# Patient Record
Sex: Male | Born: 1960
Health system: Southern US, Community
[De-identification: ages and names within clinical notes are randomized; demographics above are authoritative.]

## PROBLEM LIST (undated history)

## (undated) DIAGNOSIS — I429 Cardiomyopathy, unspecified: Secondary | ICD-10-CM

## (undated) DIAGNOSIS — C8590 Non-Hodgkin lymphoma, unspecified, unspecified site: Secondary | ICD-10-CM

## (undated) DIAGNOSIS — M87059 Idiopathic aseptic necrosis of unspecified femur: Secondary | ICD-10-CM

## (undated) DIAGNOSIS — B20 Human immunodeficiency virus [HIV] disease: Secondary | ICD-10-CM

## (undated) DIAGNOSIS — I10 Essential (primary) hypertension: Secondary | ICD-10-CM

## (undated) DIAGNOSIS — B029 Zoster without complications: Secondary | ICD-10-CM

## (undated) DIAGNOSIS — K130 Diseases of lips: Secondary | ICD-10-CM

## (undated) DIAGNOSIS — B59 Pneumocystosis: Secondary | ICD-10-CM

## (undated) DIAGNOSIS — Z8709 Personal history of other diseases of the respiratory system: Secondary | ICD-10-CM

## (undated) DIAGNOSIS — R079 Chest pain, unspecified: Secondary | ICD-10-CM

## (undated) DIAGNOSIS — Z21 Asymptomatic human immunodeficiency virus [HIV] infection status: Secondary | ICD-10-CM

## (undated) DIAGNOSIS — B370 Candidal stomatitis: Secondary | ICD-10-CM

## (undated) DIAGNOSIS — Z720 Tobacco use: Secondary | ICD-10-CM

## (undated) HISTORY — DX: Zoster without complications: B02.9

## (undated) HISTORY — DX: Tobacco use: Z72.0

## (undated) HISTORY — DX: Idiopathic aseptic necrosis of unspecified femur: M87.059

## (undated) HISTORY — DX: Pneumocystosis: B59

## (undated) HISTORY — DX: Diseases of lips: K13.0

## (undated) HISTORY — DX: Human immunodeficiency virus (HIV) disease: B20

## (undated) HISTORY — DX: Non-Hodgkin lymphoma, unspecified, unspecified site: C85.90

## (undated) HISTORY — DX: Personal history of other diseases of the respiratory system: Z87.09

## (undated) HISTORY — DX: Essential (primary) hypertension: I10

## (undated) HISTORY — DX: Candidal stomatitis: B37.0

## (undated) HISTORY — DX: Asymptomatic human immunodeficiency virus (hiv) infection status: Z21

## (undated) HISTORY — DX: Cardiomyopathy, unspecified: I42.9

## (undated) HISTORY — DX: Chest pain, unspecified: R07.9

---

## 2003-06-18 DIAGNOSIS — B20 Human immunodeficiency virus [HIV] disease: Secondary | ICD-10-CM | POA: Insufficient documentation

## 2003-07-10 ENCOUNTER — Ambulatory Visit (HOSPITAL_COMMUNITY): Admission: RE | Admit: 2003-07-10 | Discharge: 2003-07-10 | Payer: Self-pay | Admitting: Family Medicine

## 2003-07-10 ENCOUNTER — Encounter: Payer: Self-pay | Admitting: Family Medicine

## 2003-07-13 ENCOUNTER — Encounter: Payer: Self-pay | Admitting: *Deleted

## 2003-07-13 ENCOUNTER — Encounter: Payer: Self-pay | Admitting: Emergency Medicine

## 2003-07-13 ENCOUNTER — Inpatient Hospital Stay (HOSPITAL_COMMUNITY): Admission: EM | Admit: 2003-07-13 | Discharge: 2003-07-29 | Payer: Self-pay | Admitting: Emergency Medicine

## 2003-07-13 DIAGNOSIS — B59 Pneumocystosis: Secondary | ICD-10-CM | POA: Insufficient documentation

## 2003-07-14 ENCOUNTER — Encounter: Payer: Self-pay | Admitting: Internal Medicine

## 2003-07-15 ENCOUNTER — Encounter: Payer: Self-pay | Admitting: Internal Medicine

## 2003-07-15 ENCOUNTER — Encounter: Payer: Self-pay | Admitting: Critical Care Medicine

## 2003-07-16 ENCOUNTER — Encounter: Payer: Self-pay | Admitting: Critical Care Medicine

## 2003-07-17 ENCOUNTER — Encounter: Payer: Self-pay | Admitting: Critical Care Medicine

## 2003-07-17 ENCOUNTER — Encounter (INDEPENDENT_AMBULATORY_CARE_PROVIDER_SITE_OTHER): Payer: Self-pay | Admitting: Emergency Medicine

## 2003-07-18 ENCOUNTER — Encounter: Payer: Self-pay | Admitting: Pulmonary Disease

## 2003-07-18 ENCOUNTER — Encounter (INDEPENDENT_AMBULATORY_CARE_PROVIDER_SITE_OTHER): Payer: Self-pay | Admitting: *Deleted

## 2003-07-18 ENCOUNTER — Encounter: Payer: Self-pay | Admitting: Critical Care Medicine

## 2003-07-18 ENCOUNTER — Encounter: Payer: Self-pay | Admitting: Cardiology

## 2003-07-18 ENCOUNTER — Encounter (INDEPENDENT_AMBULATORY_CARE_PROVIDER_SITE_OTHER): Payer: Self-pay | Admitting: Emergency Medicine

## 2003-07-18 LAB — CONVERTED CEMR LAB
CD4 Count: 200 microliters
CD4 T Cell Abs: 200

## 2003-07-19 ENCOUNTER — Encounter: Payer: Self-pay | Admitting: Pulmonary Disease

## 2003-07-19 ENCOUNTER — Encounter: Payer: Self-pay | Admitting: Critical Care Medicine

## 2003-07-20 ENCOUNTER — Encounter: Payer: Self-pay | Admitting: Pulmonary Disease

## 2003-07-22 ENCOUNTER — Encounter: Payer: Self-pay | Admitting: Critical Care Medicine

## 2003-07-23 ENCOUNTER — Encounter: Payer: Self-pay | Admitting: Internal Medicine

## 2003-07-24 ENCOUNTER — Encounter: Payer: Self-pay | Admitting: Critical Care Medicine

## 2003-07-25 ENCOUNTER — Encounter: Payer: Self-pay | Admitting: Pulmonary Disease

## 2003-08-09 ENCOUNTER — Encounter: Admission: RE | Admit: 2003-08-09 | Discharge: 2003-08-09 | Payer: Self-pay | Admitting: Internal Medicine

## 2003-09-14 ENCOUNTER — Encounter: Admission: RE | Admit: 2003-09-14 | Discharge: 2003-09-14 | Payer: Self-pay | Admitting: Infectious Diseases

## 2003-10-09 ENCOUNTER — Encounter: Admission: RE | Admit: 2003-10-09 | Discharge: 2003-10-09 | Payer: Self-pay | Admitting: Infectious Diseases

## 2003-10-23 ENCOUNTER — Encounter: Admission: RE | Admit: 2003-10-23 | Discharge: 2003-10-23 | Payer: Self-pay | Admitting: Infectious Diseases

## 2003-11-27 ENCOUNTER — Encounter: Admission: RE | Admit: 2003-11-27 | Discharge: 2003-11-27 | Payer: Self-pay | Admitting: Infectious Diseases

## 2004-06-12 ENCOUNTER — Encounter: Admission: RE | Admit: 2004-06-12 | Discharge: 2004-06-12 | Payer: Self-pay | Admitting: Infectious Diseases

## 2004-06-12 ENCOUNTER — Ambulatory Visit (HOSPITAL_COMMUNITY): Admission: RE | Admit: 2004-06-12 | Discharge: 2004-06-12 | Payer: Self-pay | Admitting: Infectious Diseases

## 2005-08-11 ENCOUNTER — Emergency Department (HOSPITAL_COMMUNITY): Admission: EM | Admit: 2005-08-11 | Discharge: 2005-08-12 | Payer: Self-pay | Admitting: Emergency Medicine

## 2005-09-24 ENCOUNTER — Ambulatory Visit (HOSPITAL_COMMUNITY): Admission: RE | Admit: 2005-09-24 | Discharge: 2005-09-24 | Payer: Self-pay | Admitting: Infectious Diseases

## 2005-09-24 ENCOUNTER — Ambulatory Visit: Payer: Self-pay | Admitting: Infectious Diseases

## 2005-09-27 ENCOUNTER — Ambulatory Visit (HOSPITAL_COMMUNITY): Admission: RE | Admit: 2005-09-27 | Discharge: 2005-09-27 | Payer: Self-pay | Admitting: Infectious Diseases

## 2005-10-08 ENCOUNTER — Ambulatory Visit: Payer: Self-pay | Admitting: Infectious Diseases

## 2005-10-22 ENCOUNTER — Ambulatory Visit: Payer: Self-pay | Admitting: Infectious Diseases

## 2005-11-05 ENCOUNTER — Emergency Department (HOSPITAL_COMMUNITY): Admission: EM | Admit: 2005-11-05 | Discharge: 2005-11-05 | Payer: Self-pay | Admitting: Emergency Medicine

## 2005-11-28 ENCOUNTER — Ambulatory Visit: Payer: Self-pay | Admitting: Infectious Diseases

## 2006-01-15 ENCOUNTER — Emergency Department (HOSPITAL_COMMUNITY): Admission: EM | Admit: 2006-01-15 | Discharge: 2006-01-15 | Payer: Self-pay | Admitting: Emergency Medicine

## 2006-01-21 ENCOUNTER — Emergency Department (HOSPITAL_COMMUNITY): Admission: EM | Admit: 2006-01-21 | Discharge: 2006-01-21 | Payer: Self-pay | Admitting: Emergency Medicine

## 2006-04-29 ENCOUNTER — Encounter (INDEPENDENT_AMBULATORY_CARE_PROVIDER_SITE_OTHER): Payer: Self-pay | Admitting: *Deleted

## 2006-04-29 ENCOUNTER — Ambulatory Visit: Payer: Self-pay | Admitting: Infectious Diseases

## 2006-04-29 ENCOUNTER — Encounter: Admission: RE | Admit: 2006-04-29 | Discharge: 2006-04-29 | Payer: Self-pay | Admitting: Infectious Diseases

## 2006-08-12 ENCOUNTER — Encounter (INDEPENDENT_AMBULATORY_CARE_PROVIDER_SITE_OTHER): Payer: Self-pay | Admitting: *Deleted

## 2006-08-12 ENCOUNTER — Ambulatory Visit: Payer: Self-pay | Admitting: Infectious Diseases

## 2006-08-12 ENCOUNTER — Encounter: Admission: RE | Admit: 2006-08-12 | Discharge: 2006-08-12 | Payer: Self-pay | Admitting: Infectious Diseases

## 2006-09-18 DIAGNOSIS — Z8709 Personal history of other diseases of the respiratory system: Secondary | ICD-10-CM | POA: Insufficient documentation

## 2006-09-23 ENCOUNTER — Ambulatory Visit: Payer: Self-pay | Admitting: Internal Medicine

## 2006-11-23 ENCOUNTER — Encounter (INDEPENDENT_AMBULATORY_CARE_PROVIDER_SITE_OTHER): Payer: Self-pay | Admitting: *Deleted

## 2006-11-23 ENCOUNTER — Encounter: Admission: RE | Admit: 2006-11-23 | Discharge: 2006-11-23 | Payer: Self-pay | Admitting: Infectious Diseases

## 2006-11-23 ENCOUNTER — Ambulatory Visit: Payer: Self-pay | Admitting: Infectious Diseases

## 2006-11-23 LAB — CONVERTED CEMR LAB
AST: 21 units/L (ref 0–37)
Albumin: 4.6 g/dL (ref 3.5–5.2)
Alkaline Phosphatase: 54 units/L (ref 39–117)
Bilirubin Urine: NEGATIVE
CD4 Count: 100 microliters
HDL: 45 mg/dL (ref >39)
HIV 1 RNA Quant: 12600 copies/mL
HIV-1 RNA Quant, Log: 4.1 — ABNORMAL HIGH (ref <1.70)
Ketones, ur: NEGATIVE mg/dL
LDL Cholesterol: 105 mg/dL — ABNORMAL HIGH (ref 0–99)
MCHC: 32.8 g/dL (ref 30.0–36.0)
MCV: 87.6 fL (ref 78.0–100.0)
Nitrite: NEGATIVE
Potassium: 4.2 meq/L (ref 3.5–5.3)
Protein, ur: NEGATIVE mg/dL
RBC / HPF: NONE SEEN (ref <3)
RDW: 15.1 % — ABNORMAL HIGH (ref 11.5–14.0)
Sodium: 141 meq/L (ref 135–145)
Specific Gravity, Urine: 1.021 (ref 1.005–1.03)
Total Bilirubin: 0.6 mg/dL (ref 0.3–1.2)
Total Protein: 7.8 g/dL (ref 6.0–8.3)
Triglycerides: 64 mg/dL (ref <150)
Urobilinogen, UA: 0.2 (ref 0.0–1.0)
VLDL: 13 mg/dL (ref 0–40)

## 2006-12-30 DIAGNOSIS — F17200 Nicotine dependence, unspecified, uncomplicated: Secondary | ICD-10-CM | POA: Insufficient documentation

## 2007-01-01 ENCOUNTER — Encounter: Payer: Self-pay | Admitting: Infectious Diseases

## 2007-01-11 ENCOUNTER — Encounter (INDEPENDENT_AMBULATORY_CARE_PROVIDER_SITE_OTHER): Payer: Self-pay | Admitting: *Deleted

## 2007-01-11 LAB — CONVERTED CEMR LAB

## 2007-01-24 ENCOUNTER — Encounter (INDEPENDENT_AMBULATORY_CARE_PROVIDER_SITE_OTHER): Payer: Self-pay | Admitting: *Deleted

## 2007-01-28 ENCOUNTER — Telehealth (INDEPENDENT_AMBULATORY_CARE_PROVIDER_SITE_OTHER): Payer: Self-pay | Admitting: Infectious Diseases

## 2007-02-08 ENCOUNTER — Telehealth: Payer: Self-pay | Admitting: Infectious Diseases

## 2007-02-10 ENCOUNTER — Encounter: Payer: Self-pay | Admitting: Infectious Diseases

## 2007-02-22 ENCOUNTER — Telehealth: Payer: Self-pay | Admitting: Infectious Diseases

## 2007-04-01 ENCOUNTER — Ambulatory Visit: Payer: Self-pay | Admitting: Infectious Diseases

## 2007-04-01 ENCOUNTER — Encounter: Admission: RE | Admit: 2007-04-01 | Discharge: 2007-04-01 | Payer: Self-pay | Admitting: Infectious Diseases

## 2007-04-01 LAB — CONVERTED CEMR LAB
ALT: 22 units/L (ref 0–53)
BUN: 11 mg/dL (ref 6–23)
CD4 Count: 120 microliters
CO2: 27 meq/L (ref 19–32)
Calcium: 9.4 mg/dL (ref 8.4–10.5)
Chloride: 107 meq/L (ref 96–112)
Creatinine, Ser: 1.07 mg/dL (ref 0.40–1.50)
Glucose, Bld: 76 mg/dL (ref 70–99)
HCT: 42.9 % (ref 39.0–52.0)
HIV 1 RNA Quant: 5890 copies/mL — ABNORMAL HIGH (ref <50)
Hemoglobin: 14.4 g/dL (ref 13.0–17.0)
MCV: 86.8 fL (ref 78.0–100.0)
Platelets: 232 10*3/uL (ref 150–400)
RBC: 4.94 M/uL (ref 4.22–5.81)
Total Bilirubin: 0.6 mg/dL (ref 0.3–1.2)
WBC: 3.9 10*3/uL — ABNORMAL LOW (ref 4.0–10.5)

## 2007-04-22 ENCOUNTER — Telehealth: Payer: Self-pay | Admitting: Infectious Diseases

## 2007-04-28 ENCOUNTER — Encounter: Payer: Self-pay | Admitting: Infectious Diseases

## 2007-06-04 ENCOUNTER — Encounter (INDEPENDENT_AMBULATORY_CARE_PROVIDER_SITE_OTHER): Payer: Self-pay | Admitting: *Deleted

## 2007-06-07 ENCOUNTER — Encounter: Admission: RE | Admit: 2007-06-07 | Discharge: 2007-06-07 | Payer: Self-pay | Admitting: Infectious Diseases

## 2007-06-07 ENCOUNTER — Ambulatory Visit: Payer: Self-pay | Admitting: Infectious Diseases

## 2007-06-07 ENCOUNTER — Telehealth: Payer: Self-pay | Admitting: Infectious Diseases

## 2007-06-07 DIAGNOSIS — B370 Candidal stomatitis: Secondary | ICD-10-CM | POA: Insufficient documentation

## 2007-06-07 DIAGNOSIS — I10 Essential (primary) hypertension: Secondary | ICD-10-CM | POA: Insufficient documentation

## 2007-08-04 ENCOUNTER — Telehealth: Payer: Self-pay | Admitting: Infectious Diseases

## 2007-08-05 ENCOUNTER — Encounter: Payer: Self-pay | Admitting: Infectious Diseases

## 2007-08-05 ENCOUNTER — Telehealth: Payer: Self-pay | Admitting: Infectious Diseases

## 2007-08-19 ENCOUNTER — Telehealth (INDEPENDENT_AMBULATORY_CARE_PROVIDER_SITE_OTHER): Payer: Self-pay | Admitting: *Deleted

## 2007-09-06 ENCOUNTER — Encounter: Admission: RE | Admit: 2007-09-06 | Discharge: 2007-09-06 | Payer: Self-pay | Admitting: Infectious Diseases

## 2007-09-06 ENCOUNTER — Ambulatory Visit: Payer: Self-pay | Admitting: Infectious Diseases

## 2007-09-06 LAB — CONVERTED CEMR LAB
ALT: 18 units/L (ref 0–53)
Albumin: 4.2 g/dL (ref 3.5–5.2)
Alkaline Phosphatase: 49 units/L (ref 39–117)
Basophils Absolute: 0 10*3/uL (ref 0.0–0.1)
CO2: 23 meq/L (ref 19–32)
Eosinophils Absolute: 0.1 10*3/uL (ref 0.0–0.7)
Eosinophils Relative: 3 % (ref 0–5)
HCT: 39.3 % (ref 39.0–52.0)
Lymphocytes Relative: 27 % (ref 12–46)
Neutrophils Relative %: 49 % (ref 43–77)
Platelets: 193 10*3/uL (ref 150–400)
Potassium: 3.8 meq/L (ref 3.5–5.3)
RDW: 14.3 % — ABNORMAL HIGH (ref 11.5–14.0)
Sodium: 141 meq/L (ref 135–145)
Total Bilirubin: 0.5 mg/dL (ref 0.3–1.2)
Total Protein: 7.4 g/dL (ref 6.0–8.3)
WBC: 3.8 10*3/uL — ABNORMAL LOW (ref 4.0–10.5)

## 2007-09-20 ENCOUNTER — Ambulatory Visit: Payer: Self-pay | Admitting: Infectious Diseases

## 2007-11-16 ENCOUNTER — Telehealth: Payer: Self-pay | Admitting: Infectious Diseases

## 2007-11-16 ENCOUNTER — Encounter (INDEPENDENT_AMBULATORY_CARE_PROVIDER_SITE_OTHER): Payer: Self-pay | Admitting: *Deleted

## 2007-12-27 ENCOUNTER — Encounter: Admission: RE | Admit: 2007-12-27 | Discharge: 2007-12-27 | Payer: Self-pay | Admitting: Infectious Diseases

## 2007-12-27 ENCOUNTER — Ambulatory Visit: Payer: Self-pay | Admitting: Infectious Diseases

## 2007-12-27 LAB — CONVERTED CEMR LAB
AST: 43 units/L — ABNORMAL HIGH (ref 0–37)
Alkaline Phosphatase: 57 units/L (ref 39–117)
BUN: 10 mg/dL (ref 6–23)
Calcium: 9.3 mg/dL (ref 8.4–10.5)
Creatinine, Ser: 0.71 mg/dL (ref 0.40–1.50)
HCT: 40.4 % (ref 39.0–52.0)
HDL: 42 mg/dL (ref >39)
HIV 1 RNA Quant: 51600 copies/mL — ABNORMAL HIGH (ref <50)
HIV-1 RNA Quant, Log: 4.71 — ABNORMAL HIGH (ref <1.70)
Hemoglobin: 13 g/dL (ref 13.0–17.0)
LDL Cholesterol: 81 mg/dL (ref 0–99)
MCHC: 32.2 g/dL (ref 30.0–36.0)
MCV: 88.6 fL (ref 78.0–100.0)
RDW: 14.4 % (ref 11.5–15.5)
Total Bilirubin: 0.6 mg/dL (ref 0.3–1.2)
Total CHOL/HDL Ratio: 3.2
VLDL: 13 mg/dL (ref 0–40)

## 2008-03-03 ENCOUNTER — Telehealth (INDEPENDENT_AMBULATORY_CARE_PROVIDER_SITE_OTHER): Payer: Self-pay | Admitting: *Deleted

## 2008-03-28 ENCOUNTER — Ambulatory Visit: Payer: Self-pay | Admitting: Infectious Diseases

## 2008-03-28 ENCOUNTER — Encounter: Admission: RE | Admit: 2008-03-28 | Discharge: 2008-03-28 | Payer: Self-pay | Admitting: Infectious Diseases

## 2008-03-28 LAB — CONVERTED CEMR LAB
Alkaline Phosphatase: 55 units/L (ref 39–117)
Glucose, Bld: 79 mg/dL (ref 70–99)
HIV 1 RNA Quant: 45500 copies/mL — ABNORMAL HIGH (ref <50)
HIV-1 RNA Quant, Log: 4.66 — ABNORMAL HIGH (ref <1.70)
MCHC: 33.4 g/dL (ref 30.0–36.0)
MCV: 85.7 fL (ref 78.0–100.0)
RBC: 4.61 M/uL (ref 4.22–5.81)
Sodium: 145 meq/L (ref 135–145)
Total Bilirubin: 0.5 mg/dL (ref 0.3–1.2)
Total Protein: 7.5 g/dL (ref 6.0–8.3)

## 2008-05-03 ENCOUNTER — Telehealth: Payer: Self-pay

## 2008-05-04 ENCOUNTER — Ambulatory Visit: Payer: Self-pay | Admitting: Oncology

## 2008-05-10 LAB — CBC WITH DIFFERENTIAL/PLATELET
Basophils Absolute: 0 10*3/uL (ref 0.0–0.1)
Eosinophils Absolute: 0.2 10*3/uL (ref 0.0–0.5)
HGB: 13.3 g/dL (ref 13.0–17.1)
MCV: 85.3 fL (ref 81.6–98.0)
MONO#: 0.5 10*3/uL (ref 0.1–0.9)
NEUT#: 1.4 10*3/uL — ABNORMAL LOW (ref 1.5–6.5)
RDW: 14.6 % (ref 11.2–14.6)
lymph#: 0.9 10*3/uL (ref 0.9–3.3)

## 2008-05-10 LAB — MORPHOLOGY: PLT EST: ADEQUATE

## 2008-05-11 ENCOUNTER — Ambulatory Visit (HOSPITAL_COMMUNITY): Admission: RE | Admit: 2008-05-11 | Discharge: 2008-05-11 | Payer: Self-pay | Admitting: Oncology

## 2008-05-12 ENCOUNTER — Ambulatory Visit (HOSPITAL_COMMUNITY): Admission: RE | Admit: 2008-05-12 | Discharge: 2008-05-12 | Payer: Self-pay | Admitting: Oncology

## 2008-05-16 LAB — COMPREHENSIVE METABOLIC PANEL
Albumin: 4.4 g/dL (ref 3.5–5.2)
Alkaline Phosphatase: 51 U/L (ref 39–117)
BUN: 15 mg/dL (ref 6–23)
Calcium: 9.2 mg/dL (ref 8.4–10.5)
Creatinine, Ser: 0.8 mg/dL (ref 0.40–1.50)
Glucose, Bld: 74 mg/dL (ref 70–99)
Potassium: 3.8 mEq/L (ref 3.5–5.3)

## 2008-05-16 LAB — HEPATITIS C ANTIBODY: HCV Ab: NEGATIVE

## 2008-05-16 LAB — HEPATITIS B SURFACE ANTIGEN: Hepatitis B Surface Ag: NEGATIVE

## 2008-05-16 LAB — CYTOMEGALOVIRUS ANTIBODY, IGG: Cytomegalovirus Ab-IgG: 4.63 Index — ABNORMAL HIGH (ref <0.91)

## 2008-05-16 LAB — URIC ACID: Uric Acid, Serum: 6.2 mg/dL (ref 4.0–7.8)

## 2008-05-16 LAB — CMV IGM: CMV IgM: <0.9 Index (ref <0.90)

## 2008-05-16 LAB — EPSTEIN-BARR VIRUS VCA, IGM: EBV VCA IgM: 0.31 {ISR}

## 2008-05-22 ENCOUNTER — Encounter: Payer: Self-pay | Admitting: Infectious Diseases

## 2008-05-29 ENCOUNTER — Ambulatory Visit: Payer: Self-pay | Admitting: Dentistry

## 2008-05-29 ENCOUNTER — Encounter: Admission: RE | Admit: 2008-05-29 | Discharge: 2008-05-29 | Payer: Self-pay | Admitting: Dentistry

## 2008-05-31 ENCOUNTER — Telehealth (INDEPENDENT_AMBULATORY_CARE_PROVIDER_SITE_OTHER): Payer: Self-pay | Admitting: *Deleted

## 2008-06-01 ENCOUNTER — Ambulatory Visit: Payer: Self-pay | Admitting: Dentistry

## 2008-06-01 ENCOUNTER — Ambulatory Visit (HOSPITAL_COMMUNITY): Admission: RE | Admit: 2008-06-01 | Discharge: 2008-06-01 | Payer: Self-pay | Admitting: Dentistry

## 2008-06-02 ENCOUNTER — Encounter: Payer: Self-pay | Admitting: Infectious Diseases

## 2008-06-06 ENCOUNTER — Encounter: Payer: Self-pay | Admitting: Infectious Diseases

## 2008-06-12 ENCOUNTER — Ambulatory Visit: Admission: RE | Admit: 2008-06-12 | Discharge: 2008-08-16 | Payer: Self-pay | Admitting: Radiation Oncology

## 2008-06-16 ENCOUNTER — Encounter: Payer: Self-pay | Admitting: Infectious Diseases

## 2008-06-16 ENCOUNTER — Telehealth (INDEPENDENT_AMBULATORY_CARE_PROVIDER_SITE_OTHER): Payer: Self-pay | Admitting: *Deleted

## 2008-07-04 ENCOUNTER — Telehealth (INDEPENDENT_AMBULATORY_CARE_PROVIDER_SITE_OTHER): Payer: Self-pay | Admitting: *Deleted

## 2008-07-11 ENCOUNTER — Telehealth: Payer: Self-pay | Admitting: Infectious Diseases

## 2008-08-08 ENCOUNTER — Encounter: Payer: Self-pay | Admitting: Infectious Diseases

## 2008-08-14 ENCOUNTER — Telehealth (INDEPENDENT_AMBULATORY_CARE_PROVIDER_SITE_OTHER): Payer: Self-pay | Admitting: *Deleted

## 2008-08-16 ENCOUNTER — Encounter (INDEPENDENT_AMBULATORY_CARE_PROVIDER_SITE_OTHER): Payer: Self-pay | Admitting: *Deleted

## 2008-08-28 ENCOUNTER — Telehealth (INDEPENDENT_AMBULATORY_CARE_PROVIDER_SITE_OTHER): Payer: Self-pay | Admitting: *Deleted

## 2008-09-04 ENCOUNTER — Telehealth: Payer: Self-pay

## 2008-09-04 ENCOUNTER — Ambulatory Visit: Payer: Self-pay | Admitting: Dentistry

## 2008-09-05 ENCOUNTER — Ambulatory Visit: Payer: Self-pay | Admitting: Infectious Diseases

## 2008-09-05 ENCOUNTER — Ambulatory Visit: Admission: RE | Admit: 2008-09-05 | Discharge: 2008-12-04 | Payer: Self-pay | Admitting: Radiation Oncology

## 2008-09-05 LAB — CONVERTED CEMR LAB
BUN: 8 mg/dL (ref 6–23)
CO2: 25 meq/L (ref 19–32)
Calcium: 8.8 mg/dL (ref 8.4–10.5)
Chloride: 108 meq/L (ref 96–112)
Creatinine, Ser: 0.71 mg/dL (ref 0.40–1.50)
Glucose, Bld: 95 mg/dL (ref 70–99)
HIV 1 RNA Quant: 2770 copies/mL — ABNORMAL HIGH (ref <50)
Hemoglobin: 11.7 g/dL — ABNORMAL LOW (ref 13.0–17.0)
Lymphocytes Relative: 35 % (ref 12–46)
Lymphs Abs: 1.2 10*3/uL (ref 0.7–4.0)
MCHC: 32.1 g/dL (ref 30.0–36.0)
Monocytes Absolute: 0.4 10*3/uL (ref 0.1–1.0)
Monocytes Relative: 10 % (ref 3–12)
Neutro Abs: 1.8 10*3/uL (ref 1.7–7.7)
RBC: 4.14 M/uL — ABNORMAL LOW (ref 4.22–5.81)

## 2008-09-11 ENCOUNTER — Telehealth: Payer: Self-pay | Admitting: Infectious Diseases

## 2008-09-19 ENCOUNTER — Ambulatory Visit: Payer: Self-pay | Admitting: Infectious Diseases

## 2008-09-19 DIAGNOSIS — C8591 Non-Hodgkin lymphoma, unspecified, lymph nodes of head, face, and neck: Secondary | ICD-10-CM | POA: Insufficient documentation

## 2008-09-19 LAB — CONVERTED CEMR LAB

## 2008-09-21 ENCOUNTER — Telehealth (INDEPENDENT_AMBULATORY_CARE_PROVIDER_SITE_OTHER): Payer: Self-pay | Admitting: *Deleted

## 2008-10-19 ENCOUNTER — Telehealth (INDEPENDENT_AMBULATORY_CARE_PROVIDER_SITE_OTHER): Payer: Self-pay | Admitting: *Deleted

## 2008-10-20 ENCOUNTER — Telehealth: Payer: Self-pay

## 2008-10-23 ENCOUNTER — Encounter: Payer: Self-pay | Admitting: Infectious Diseases

## 2008-10-24 ENCOUNTER — Telehealth: Payer: Self-pay | Admitting: Infectious Diseases

## 2008-11-13 ENCOUNTER — Ambulatory Visit: Payer: Self-pay | Admitting: Oncology

## 2008-11-15 ENCOUNTER — Telehealth (INDEPENDENT_AMBULATORY_CARE_PROVIDER_SITE_OTHER): Payer: Self-pay | Admitting: *Deleted

## 2008-11-20 ENCOUNTER — Ambulatory Visit: Payer: Self-pay | Admitting: Infectious Diseases

## 2008-11-20 LAB — CBC WITH DIFFERENTIAL/PLATELET
BASO%: 0.5 % (ref 0.0–2.0)
EOS%: 1.9 % (ref 0.0–7.0)
HCT: 40.5 % (ref 38.7–49.9)
LYMPH%: 38.7 % (ref 14.0–48.0)
MCH: 30.8 pg (ref 28.0–33.4)
MCHC: 34 g/dL (ref 32.0–35.9)
NEUT%: 50.7 % (ref 40.0–75.0)
Platelets: 172 10*3/uL (ref 145–400)
RBC: 4.47 10*6/uL (ref 4.20–5.71)

## 2008-11-20 LAB — COMPREHENSIVE METABOLIC PANEL
ALT: 29 U/L (ref 0–53)
CO2: 28 mEq/L (ref 19–32)
Creatinine, Ser: 0.72 mg/dL (ref 0.40–1.50)
Total Bilirubin: 0.6 mg/dL (ref 0.3–1.2)

## 2008-11-20 LAB — LACTATE DEHYDROGENASE: LDH: 116 U/L (ref 94–250)

## 2008-11-20 LAB — MORPHOLOGY: RBC Comments: NORMAL

## 2008-11-21 ENCOUNTER — Encounter: Payer: Self-pay | Admitting: Infectious Diseases

## 2008-11-21 LAB — SEDIMENTATION RATE: Sed Rate: 14 mm/hr (ref 0–16)

## 2008-11-22 ENCOUNTER — Encounter: Payer: Self-pay | Admitting: Infectious Diseases

## 2008-11-23 ENCOUNTER — Encounter: Payer: Self-pay | Admitting: Infectious Diseases

## 2008-11-23 ENCOUNTER — Ambulatory Visit (HOSPITAL_COMMUNITY): Admission: RE | Admit: 2008-11-23 | Discharge: 2008-11-23 | Payer: Self-pay | Admitting: Oncology

## 2008-11-30 ENCOUNTER — Ambulatory Visit: Payer: Self-pay | Admitting: Infectious Diseases

## 2008-11-30 LAB — CONVERTED CEMR LAB
ALT: 20 units/L (ref 0–53)
Albumin: 4.4 g/dL (ref 3.5–5.2)
Alkaline Phosphatase: 56 units/L (ref 39–117)
Basophils Absolute: 0 10*3/uL (ref 0.0–0.1)
Eosinophils Absolute: 0.1 10*3/uL (ref 0.0–0.7)
Glucose, Bld: 102 mg/dL — ABNORMAL HIGH (ref 70–99)
HIV 1 RNA Quant: 326 copies/mL — ABNORMAL HIGH (ref <48)
HIV-1 RNA Quant, Log: 2.51 — ABNORMAL HIGH (ref <1.68)
LDL Cholesterol: 91 mg/dL (ref 0–99)
Lymphocytes Relative: 32 % (ref 12–46)
Lymphs Abs: 1.3 10*3/uL (ref 0.7–4.0)
MCV: 90.2 fL (ref 78.0–100.0)
Neutrophils Relative %: 56 % (ref 43–77)
Platelets: 285 10*3/uL (ref 150–400)
Potassium: 4 meq/L (ref 3.5–5.3)
Sodium: 142 meq/L (ref 135–145)
Total Protein: 7.4 g/dL (ref 6.0–8.3)
Triglycerides: 86 mg/dL (ref <150)
WBC: 4.2 10*3/uL (ref 4.0–10.5)

## 2008-12-04 ENCOUNTER — Ambulatory Visit: Payer: Self-pay | Admitting: Dentistry

## 2008-12-15 ENCOUNTER — Telehealth (INDEPENDENT_AMBULATORY_CARE_PROVIDER_SITE_OTHER): Payer: Self-pay | Admitting: *Deleted

## 2008-12-20 ENCOUNTER — Encounter: Payer: Self-pay | Admitting: Infectious Diseases

## 2008-12-26 ENCOUNTER — Telehealth: Payer: Self-pay | Admitting: Infectious Diseases

## 2008-12-28 ENCOUNTER — Encounter (INDEPENDENT_AMBULATORY_CARE_PROVIDER_SITE_OTHER): Payer: Self-pay | Admitting: *Deleted

## 2009-01-02 ENCOUNTER — Ambulatory Visit: Payer: Self-pay | Admitting: Infectious Diseases

## 2009-01-03 ENCOUNTER — Encounter: Payer: Self-pay | Admitting: Infectious Diseases

## 2009-01-03 ENCOUNTER — Telehealth: Payer: Self-pay | Admitting: Infectious Diseases

## 2009-01-11 ENCOUNTER — Telehealth (INDEPENDENT_AMBULATORY_CARE_PROVIDER_SITE_OTHER): Payer: Self-pay | Admitting: *Deleted

## 2009-01-26 ENCOUNTER — Encounter: Payer: Self-pay | Admitting: Infectious Diseases

## 2009-02-02 ENCOUNTER — Encounter: Payer: Self-pay | Admitting: Infectious Diseases

## 2009-02-07 ENCOUNTER — Telehealth (INDEPENDENT_AMBULATORY_CARE_PROVIDER_SITE_OTHER): Payer: Self-pay | Admitting: *Deleted

## 2009-02-15 ENCOUNTER — Ambulatory Visit: Payer: Self-pay | Admitting: Oncology

## 2009-02-19 ENCOUNTER — Encounter: Payer: Self-pay | Admitting: Infectious Diseases

## 2009-03-10 ENCOUNTER — Emergency Department (HOSPITAL_COMMUNITY): Admission: EM | Admit: 2009-03-10 | Discharge: 2009-03-10 | Payer: Self-pay | Admitting: Emergency Medicine

## 2009-03-12 ENCOUNTER — Ambulatory Visit: Payer: Self-pay | Admitting: Infectious Disease

## 2009-03-12 DIAGNOSIS — B029 Zoster without complications: Secondary | ICD-10-CM | POA: Insufficient documentation

## 2009-03-14 ENCOUNTER — Telehealth (INDEPENDENT_AMBULATORY_CARE_PROVIDER_SITE_OTHER): Payer: Self-pay | Admitting: *Deleted

## 2009-03-26 ENCOUNTER — Ambulatory Visit: Payer: Self-pay | Admitting: Infectious Diseases

## 2009-03-26 LAB — CONVERTED CEMR LAB
ALT: 15 units/L (ref 0–53)
AST: 23 units/L (ref 0–37)
Alkaline Phosphatase: 72 units/L (ref 39–117)
Basophils Absolute: 0 10*3/uL (ref 0.0–0.1)
Basophils Relative: 0 % (ref 0–1)
CO2: 20 meq/L (ref 19–32)
Cholesterol: 158 mg/dL (ref 0–200)
Creatinine, Ser: 0.76 mg/dL (ref 0.40–1.50)
Eosinophils Relative: 2 % (ref 0–5)
HCT: 41.9 % (ref 39.0–52.0)
HIV 1 RNA Quant: 427 copies/mL — ABNORMAL HIGH (ref <48)
HIV-1 RNA Quant, Log: 2.63 — ABNORMAL HIGH (ref <1.68)
Hemoglobin: 14.1 g/dL (ref 13.0–17.0)
LDL Cholesterol: 99 mg/dL (ref 0–99)
MCHC: 33.7 g/dL (ref 30.0–36.0)
MCV: 88.6 fL (ref 78.0–100.0)
Monocytes Absolute: 0.7 10*3/uL (ref 0.1–1.0)
Monocytes Relative: 12 % (ref 3–12)
RBC: 4.73 M/uL (ref 4.22–5.81)
RDW: 14.5 % (ref 11.5–15.5)
Sodium: 139 meq/L (ref 135–145)
Total Bilirubin: 0.4 mg/dL (ref 0.3–1.2)
Total CHOL/HDL Ratio: 3.4
Total Protein: 8.1 g/dL (ref 6.0–8.3)
Triglycerides: 62 mg/dL (ref <150)
VLDL: 12 mg/dL (ref 0–40)

## 2009-04-02 ENCOUNTER — Telehealth: Payer: Self-pay | Admitting: Infectious Diseases

## 2009-04-11 ENCOUNTER — Telehealth (INDEPENDENT_AMBULATORY_CARE_PROVIDER_SITE_OTHER): Payer: Self-pay | Admitting: *Deleted

## 2009-04-19 ENCOUNTER — Ambulatory Visit: Payer: Self-pay | Admitting: Oncology

## 2009-04-23 ENCOUNTER — Encounter: Payer: Self-pay | Admitting: Infectious Diseases

## 2009-04-25 ENCOUNTER — Telehealth: Payer: Self-pay | Admitting: Infectious Diseases

## 2009-05-07 ENCOUNTER — Encounter: Payer: Self-pay | Admitting: Infectious Diseases

## 2009-05-10 ENCOUNTER — Telehealth (INDEPENDENT_AMBULATORY_CARE_PROVIDER_SITE_OTHER): Payer: Self-pay | Admitting: *Deleted

## 2009-05-25 ENCOUNTER — Telehealth: Payer: Self-pay | Admitting: Infectious Diseases

## 2009-07-24 ENCOUNTER — Telehealth: Payer: Self-pay | Admitting: Infectious Diseases

## 2009-07-31 ENCOUNTER — Encounter: Payer: Self-pay | Admitting: Infectious Diseases

## 2009-08-01 ENCOUNTER — Telehealth: Payer: Self-pay | Admitting: Infectious Diseases

## 2009-08-01 ENCOUNTER — Ambulatory Visit: Payer: Self-pay | Admitting: Infectious Diseases

## 2009-08-01 LAB — CONVERTED CEMR LAB
ALT: 25 U/L
AST: 22 U/L
Albumin: 4.3 g/dL
Alkaline Phosphatase: 79 U/L
BUN: 14 mg/dL
Basophils Absolute: 0 K/uL
Basophils Relative: 0 %
CO2: 22 meq/L
Calcium: 9.9 mg/dL
Chloride: 108 meq/L
Creatinine, Ser: 0.91 mg/dL
Eosinophils Absolute: 0.2 K/uL
Eosinophils Relative: 3 %
Glucose, Bld: 102 mg/dL — ABNORMAL HIGH
HCT: 42.6 %
Hemoglobin: 14.1 g/dL
Lymphocytes Relative: 28 %
Lymphs Abs: 1.9 K/uL
MCHC: 33.1 g/dL
MCV: 88 fL
Monocytes Absolute: 0.9 K/uL
Monocytes Relative: 13 % — ABNORMAL HIGH
Neutro Abs: 3.9 K/uL
Neutrophils Relative %: 57 %
Platelets: 255 K/uL
Potassium: 4.1 meq/L
RBC: 4.84 M/uL
RDW: 15.4 %
Sodium: 141 meq/L
Total Bilirubin: 1.2 mg/dL
Total Protein: 7.3 g/dL
WBC: 6.8 10*3/microliter

## 2009-08-03 ENCOUNTER — Telehealth (INDEPENDENT_AMBULATORY_CARE_PROVIDER_SITE_OTHER): Payer: Self-pay | Admitting: *Deleted

## 2009-08-03 ENCOUNTER — Encounter: Payer: Self-pay | Admitting: Infectious Diseases

## 2009-08-03 LAB — CONVERTED CEMR LAB: HIV 1 RNA Quant: 7340 copies/mL — ABNORMAL HIGH (ref <48)

## 2009-08-08 ENCOUNTER — Encounter: Payer: Self-pay | Admitting: Infectious Diseases

## 2009-08-16 ENCOUNTER — Encounter: Payer: Self-pay | Admitting: Infectious Diseases

## 2009-09-03 ENCOUNTER — Telehealth (INDEPENDENT_AMBULATORY_CARE_PROVIDER_SITE_OTHER): Payer: Self-pay | Admitting: *Deleted

## 2009-09-05 ENCOUNTER — Telehealth: Payer: Self-pay | Admitting: Infectious Diseases

## 2009-09-06 ENCOUNTER — Ambulatory Visit (HOSPITAL_COMMUNITY): Admission: RE | Admit: 2009-09-06 | Discharge: 2009-09-06 | Payer: Self-pay | Admitting: Orthopedic Surgery

## 2009-09-12 ENCOUNTER — Encounter: Payer: Self-pay | Admitting: Infectious Diseases

## 2009-10-08 ENCOUNTER — Telehealth (INDEPENDENT_AMBULATORY_CARE_PROVIDER_SITE_OTHER): Payer: Self-pay | Admitting: *Deleted

## 2009-10-17 ENCOUNTER — Encounter: Admission: RE | Admit: 2009-10-17 | Discharge: 2009-11-19 | Payer: Self-pay | Admitting: Orthopedic Surgery

## 2009-10-18 ENCOUNTER — Telehealth: Payer: Self-pay | Admitting: Infectious Disease

## 2009-10-24 ENCOUNTER — Ambulatory Visit: Payer: Self-pay | Admitting: Infectious Diseases

## 2009-10-24 LAB — CONVERTED CEMR LAB
Albumin: 4.7 g/dL (ref 3.5–5.2)
Basophils Relative: 1 % (ref 0–1)
CO2: 23 meq/L (ref 19–32)
Glucose, Bld: 86 mg/dL (ref 70–99)
HCT: 40.8 % (ref 39.0–52.0)
HIV 1 RNA Quant: <48 copies/mL (ref <48)
HIV-1 RNA Quant, Log: <1.68 (ref <1.68)
Hemoglobin: 13.6 g/dL (ref 13.0–17.0)
MCHC: 33.3 g/dL (ref 30.0–36.0)
Monocytes Absolute: 0.4 10*3/uL (ref 0.1–1.0)
Monocytes Relative: 8 % (ref 3–12)
Neutro Abs: 3 10*3/uL (ref 1.7–7.7)
Potassium: 4.5 meq/L (ref 3.5–5.3)
RBC: 4.8 M/uL (ref 4.22–5.81)
Sodium: 141 meq/L (ref 135–145)
Total Bilirubin: 0.3 mg/dL (ref 0.3–1.2)
Total Protein: 7.8 g/dL (ref 6.0–8.3)

## 2009-11-02 ENCOUNTER — Telehealth (INDEPENDENT_AMBULATORY_CARE_PROVIDER_SITE_OTHER): Payer: Self-pay | Admitting: *Deleted

## 2009-11-19 ENCOUNTER — Encounter: Admission: RE | Admit: 2009-11-19 | Discharge: 2009-11-22 | Payer: Self-pay | Admitting: Orthopedic Surgery

## 2009-11-23 ENCOUNTER — Encounter: Payer: Self-pay | Admitting: Infectious Diseases

## 2009-11-29 ENCOUNTER — Telehealth (INDEPENDENT_AMBULATORY_CARE_PROVIDER_SITE_OTHER): Payer: Self-pay | Admitting: *Deleted

## 2009-12-03 ENCOUNTER — Encounter: Payer: Self-pay | Admitting: Infectious Diseases

## 2009-12-14 ENCOUNTER — Telehealth: Payer: Self-pay | Admitting: Infectious Diseases

## 2009-12-29 ENCOUNTER — Telehealth (INDEPENDENT_AMBULATORY_CARE_PROVIDER_SITE_OTHER): Payer: Self-pay | Admitting: *Deleted

## 2010-01-01 ENCOUNTER — Inpatient Hospital Stay (HOSPITAL_COMMUNITY): Admission: RE | Admit: 2010-01-01 | Discharge: 2010-01-04 | Payer: Self-pay | Admitting: Orthopaedic Surgery

## 2010-01-03 ENCOUNTER — Ambulatory Visit: Payer: Self-pay | Admitting: Vascular Surgery

## 2010-01-03 ENCOUNTER — Encounter (INDEPENDENT_AMBULATORY_CARE_PROVIDER_SITE_OTHER): Payer: Self-pay | Admitting: Orthopaedic Surgery

## 2010-01-07 ENCOUNTER — Encounter: Payer: Self-pay | Admitting: Infectious Diseases

## 2010-01-18 ENCOUNTER — Encounter: Payer: Self-pay | Admitting: Infectious Diseases

## 2010-02-01 ENCOUNTER — Encounter: Payer: Self-pay | Admitting: Infectious Diseases

## 2010-02-05 ENCOUNTER — Telehealth (INDEPENDENT_AMBULATORY_CARE_PROVIDER_SITE_OTHER): Payer: Self-pay | Admitting: *Deleted

## 2010-02-12 ENCOUNTER — Encounter: Payer: Self-pay | Admitting: Infectious Diseases

## 2010-02-22 ENCOUNTER — Telehealth (INDEPENDENT_AMBULATORY_CARE_PROVIDER_SITE_OTHER): Payer: Self-pay | Admitting: *Deleted

## 2010-02-25 ENCOUNTER — Encounter: Admission: RE | Admit: 2010-02-25 | Discharge: 2010-03-18 | Payer: Self-pay | Admitting: Orthopaedic Surgery

## 2010-03-25 ENCOUNTER — Telehealth (INDEPENDENT_AMBULATORY_CARE_PROVIDER_SITE_OTHER): Payer: Self-pay | Admitting: *Deleted

## 2010-03-27 ENCOUNTER — Ambulatory Visit: Payer: Self-pay | Admitting: Infectious Diseases

## 2010-03-27 ENCOUNTER — Ambulatory Visit (HOSPITAL_COMMUNITY): Admission: RE | Admit: 2010-03-27 | Discharge: 2010-03-27 | Payer: Self-pay | Admitting: Infectious Diseases

## 2010-04-01 ENCOUNTER — Encounter: Payer: Self-pay | Admitting: Infectious Diseases

## 2010-04-04 ENCOUNTER — Inpatient Hospital Stay (HOSPITAL_COMMUNITY): Admission: RE | Admit: 2010-04-04 | Discharge: 2010-04-06 | Payer: Self-pay | Admitting: Orthopaedic Surgery

## 2010-04-17 ENCOUNTER — Encounter: Payer: Self-pay | Admitting: Infectious Diseases

## 2010-04-29 ENCOUNTER — Ambulatory Visit: Payer: Self-pay | Admitting: Infectious Diseases

## 2010-04-29 LAB — CONVERTED CEMR LAB
AST: 18 units/L (ref 0–37)
Albumin: 4.4 g/dL (ref 3.5–5.2)
Alkaline Phosphatase: 83 units/L (ref 39–117)
BUN: 8 mg/dL (ref 6–23)
Basophils Absolute: 0 10*3/uL (ref 0.0–0.1)
HDL: 43 mg/dL (ref >39)
LDL Cholesterol: 89 mg/dL (ref 0–99)
Lymphocytes Relative: 29 % (ref 12–46)
Neutro Abs: 3.9 10*3/uL (ref 1.7–7.7)
Neutrophils Relative %: 60 % (ref 43–77)
Platelets: 498 10*3/uL — ABNORMAL HIGH (ref 150–400)
Potassium: 4 meq/L (ref 3.5–5.3)
RDW: 17.3 % — ABNORMAL HIGH (ref 11.5–15.5)
Sodium: 139 meq/L (ref 135–145)
Total Protein: 7.7 g/dL (ref 6.0–8.3)
VLDL: 11 mg/dL (ref 0–40)

## 2010-06-11 ENCOUNTER — Encounter: Payer: Self-pay | Admitting: Infectious Diseases

## 2010-07-24 ENCOUNTER — Telehealth: Payer: Self-pay | Admitting: Infectious Diseases

## 2010-07-25 ENCOUNTER — Encounter: Payer: Self-pay | Admitting: Infectious Diseases

## 2010-10-01 ENCOUNTER — Ambulatory Visit (HOSPITAL_COMMUNITY): Admission: RE | Admit: 2010-10-01 | Discharge: 2010-10-01 | Payer: Self-pay | Admitting: Infectious Diseases

## 2010-10-01 ENCOUNTER — Ambulatory Visit: Payer: Self-pay | Admitting: Infectious Diseases

## 2010-10-01 ENCOUNTER — Telehealth (INDEPENDENT_AMBULATORY_CARE_PROVIDER_SITE_OTHER): Payer: Self-pay | Admitting: Licensed Clinical Social Worker

## 2010-10-01 DIAGNOSIS — R079 Chest pain, unspecified: Secondary | ICD-10-CM | POA: Insufficient documentation

## 2010-10-08 ENCOUNTER — Encounter (INDEPENDENT_AMBULATORY_CARE_PROVIDER_SITE_OTHER): Payer: Self-pay | Admitting: *Deleted

## 2010-10-22 ENCOUNTER — Encounter: Payer: Self-pay | Admitting: Infectious Diseases

## 2010-11-19 ENCOUNTER — Ambulatory Visit
Admission: RE | Admit: 2010-11-19 | Discharge: 2010-11-19 | Payer: Self-pay | Source: Home / Self Care | Attending: Infectious Diseases | Admitting: Infectious Diseases

## 2010-11-19 ENCOUNTER — Encounter: Payer: Self-pay | Admitting: Infectious Diseases

## 2010-11-19 ENCOUNTER — Ambulatory Visit
Admission: RE | Admit: 2010-11-19 | Discharge: 2010-11-19 | Payer: Self-pay | Source: Home / Self Care | Attending: Cardiology | Admitting: Cardiology

## 2010-11-19 DIAGNOSIS — K130 Diseases of lips: Secondary | ICD-10-CM | POA: Insufficient documentation

## 2010-11-19 LAB — CONVERTED CEMR LAB
BUN: 8 mg/dL (ref 6–23)
CO2: 25 meq/L (ref 19–32)
Creatinine, Ser: 0.74 mg/dL (ref 0.40–1.50)
Eosinophils Absolute: 0.1 10*3/uL (ref 0.0–0.7)
Glucose, Bld: 82 mg/dL (ref 70–99)
HCT: 44 % (ref 39.0–52.0)
HIV-1 RNA Quant, Log: 1.89 — ABNORMAL HIGH (ref <1.30)
Hemoglobin: 14.9 g/dL (ref 13.0–17.0)
LDL Cholesterol: 100 mg/dL — ABNORMAL HIGH (ref 0–99)
Lymphs Abs: 2.2 10*3/uL (ref 0.7–4.0)
MCV: 86.4 fL (ref 78.0–100.0)
Monocytes Absolute: 0.5 10*3/uL (ref 0.1–1.0)
Monocytes Relative: 13 % — ABNORMAL HIGH (ref 3–12)
Neutrophils Relative %: 32 % — ABNORMAL LOW (ref 43–77)
RBC: 5.09 M/uL (ref 4.22–5.81)
Total Bilirubin: 2.5 mg/dL — ABNORMAL HIGH (ref 0.3–1.2)
Triglycerides: 64 mg/dL (ref <150)
VLDL: 13 mg/dL (ref 0–40)
WBC: 4.2 10*3/uL (ref 4.0–10.5)

## 2010-11-27 ENCOUNTER — Encounter (HOSPITAL_COMMUNITY)
Admission: RE | Admit: 2010-11-27 | Payer: Self-pay | Source: Home / Self Care | Attending: Cardiovascular Disease | Admitting: Cardiovascular Disease

## 2010-12-04 ENCOUNTER — Telehealth: Payer: Self-pay | Admitting: Cardiology

## 2010-12-08 ENCOUNTER — Encounter: Payer: Self-pay | Admitting: Oncology

## 2010-12-08 ENCOUNTER — Encounter: Payer: Self-pay | Admitting: Radiation Oncology

## 2010-12-12 ENCOUNTER — Encounter (HOSPITAL_COMMUNITY): Admission: RE | Admit: 2010-12-12 | Payer: Self-pay | Source: Home / Self Care | Admitting: Cardiology

## 2010-12-12 ENCOUNTER — Ambulatory Visit: Admit: 2010-12-12 | Payer: Self-pay

## 2010-12-17 NOTE — Letter (Signed)
Summary: [facility]  [facility]   Imported By: Bonner Puna 02/06/2010 10:09:01  _____________________________________________________________________  External Attachment:    Type:   Image     Comment:   External Document

## 2010-12-17 NOTE — Assessment & Plan Note (Signed)
Summary: F/U APPT /VS   CC:  follow-up visit.  History of Present Illness: 50 yo m with a Hx of HIV+ and AVN of both hips.  His prev CD4 was 100, VL 2770 (09-05-08). 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 has completed 6 weeks of xrt.  states his lymphoma is doing well. he only needs to have his dentures adjusted.  Had L [facility] Feb 18th.  Cont o thav pain in his R hip. would like to have that replaced as well. cont to take percocet for this as well as motrin.  Had 3 episodes of sweats and SOB last week. he awoke from sleep with signs. had at rest previously, had while walking yesterday.   Current Medications (verified): 1)  Reyataz 300 Mg Caps (Atazanavir Sulfate) .... Take 1 Tablet By Mouth Once A Day 2)  Truvada 200-300 Mg Tabs (Emtricitabine-Tenofovir) .... Take 1 Tablet By Mouth Once A Day 3)  Percocet 10-650 Mg Tabs (Oxycodone-Acetaminophen) .... One To Two Every 6 Hours As Needed Pain 4)  Isentress 400 Mg Tabs (Raltegravir Potassium) .... Take 1 Tablet By Mouth Two Times A Day 5)  Norvir 100 Mg Tabs (Ritonavir) .... Take 1 Tablet By Mouth Once A Day 6)  Metoprolol Tartrate 25 Mg Tabs (Metoprolol Tartrate) .... Take 1 Tablet By Mouth Two Times A Day  Allergies: 1)  ! Pcn   Preventive Screening-Counseling & Management  Alcohol-Tobacco     Alcohol drinks/day: 0     Smoking Status: current     Smoking Cessation Counseling: yes     Packs/Day: 1.0     Cans of tobacco/week: no     Passive Smoke Exposure: no  Caffeine-Diet-Exercise     Caffeine use/day: 2 glasses of soda per day     Does Patient Exercise: no     Type of exercise: push-ups     Times/week: 2  Safety-Violence-Falls     Seat Belt Use: yes   Updated Prior Medication List: REYATAZ 300 MG CAPS (ATAZANAVIR SULFATE) Take 1 tablet by mouth once a day TRUVADA 200-300 MG TABS (EMTRICITABINE-TENOFOVIR) Take 1 tablet by mouth once a  day PERCOCET 10-650 MG TABS (OXYCODONE-ACETAMINOPHEN) one to two every 6 hours as needed pain ISENTRESS 400 MG TABS (RALTEGRAVIR POTASSIUM) Take 1 tablet by mouth two times a day NORVIR 100 MG TABS (RITONAVIR) Take 1 tablet by mouth once a day  Current Allergies (reviewed today): ! PCN Past History:  Past medical, surgical, family and social histories (including risk factors) reviewed, and no changes noted (except as noted below).  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 Family History of CAD Male 1st degree relative <60 Family History Diabetes 1st degree relative  Social History: Reviewed history from 08/01/2009 and no changes required. Current Smoker- 1ppd Alcohol use-yes Drug use-no on Disability, still driving some.   Review of Systems       wt down 14#  Vital Signs:  Patient profile:   50 year old male Height:      65 inches (165.10 cm) Weight:      163.12 pounds (74.15 kg) BMI:  27.24 Temp:     97.8 degrees F (36.56 degrees C) oral Pulse rate:   51 / minute BP sitting:   160 / 92  (left arm)  Vitals Entered By: Sherrie George [facility]) (Mar 27, 2010 9:57 AM) CC: follow-up visit Pain Assessment Patient in pain? yes     Location: right hip Intensity: 8 Type: aching Onset of pain  Constant Nutritional Status BMI of 25 - 29 = overweight Nutritional Status Detail appetite is good per patient  Have you ever been in a relationship where you felt threatened, hurt or afraid?No   Does patient need assistance? Functional Status Self care Ambulation Normal   Physical Exam  General:  well-developed, well-nourished, and well-hydrated.   Eyes:  pupils equal, pupils round, and pupils reactive to light.   Mouth:   pharynx pink and moist and no exudates.   Neck:  no masses.  induration, prev XRT.  Lungs:  normal respiratory effort and normal breath sounds.   Heart:  normal rate, regular rhythm, and no murmur.   Abdomen:  soft, non-tender, and normal bowel sounds.          Medication Adherence: 03/27/2010   Adherence to medications reviewed with patient. Counseling to provide adequate adherence provided   Prevention For Positives: 03/27/2010   Safe sex practices discussed with patient. Condoms offered.                             Impression & Recommendations:  Problem # 1:  HIV DISEASE (ICD-042)  will stop the bactrim. he is doing well. offred condoms. update pnvx. check labs today.  The following medications were removed from the medication list:    Bactrim Ds 800-160 Mg Tabs (Sulfamethoxazole-trimethoprim) ..... One by mouth mwf  Diagnostics Reviewed:  HIV: CDC-defined AIDS (03/26/2009)   CD4: 240 (10/25/2009)   WBC: 5.4 (10/24/2009)   Hgb: 13.6 (10/24/2009)   HCT: 40.8 (10/24/2009)   Platelets: 309 (10/24/2009) HIV genotype: * (08/03/2009)   HIV-1 RNA: <48 copies/mL (10/24/2009)   HBSAg: NO (01/11/2007)  Orders: 12 Lead EKG (12 Lead EKG)  Problem # 2:  HYPERTENSION (ICD-401.9)  will start him on lopressor, check ECG.  return to clinic 1 month for bp check.   His updated medication list for this problem includes:    Metoprolol Tartrate 25 Mg Tabs (Metoprolol tartrate) .Marland Kitchen... Take 1 tablet by mouth two times a day  Orders: 12 Lead EKG (12 Lead EKG)  Problem # 3:  TOBACCO USER (ICD-305.1) encouraged to quit.   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 encouraged him to keep appt.   Medications Added to Medication List This Visit: 1)  Metoprolol Tartrate 25 Mg Tabs (Metoprolol tartrate) .... Take 1 tablet by mouth two times a day  Other Orders: Est. Patient Level IV [id number]) T-CD4SP ([facility]) (CD4SP) T-HIV  Viral Load [phone]) T-Comprehensive Metabolic Panel (A999333) T-Lipid Profile [phone]) T-CBC w/Diff [id number]) T-RPR (Syphilis) [id number])  Prescriptions: METOPROLOL TARTRATE 25 MG TABS (METOPROLOL TARTRATE) Take 1 tablet by mouth two times a day  #60 x 3   Entered and Authorized by:   Bobby Rumpf MD   Signed by:   Bobby Rumpf MD on 03/27/2010   Method used:   Electronically to        [facility]. [phone]* (retail)       [address]  [place], [place]  [address]       Ph: [phone]       Fax: [phone]   RxID:   [id number]    Pneumovax Immunization History:    Pneumovax # 1:  Historical (08/17/2009)

## 2010-12-17 NOTE — Consult Note (Signed)
Summary: [facility]  [facility]   Imported By: Bonner Puna 04/19/2010 14:40:32  _____________________________________________________________________  External Attachment:    Type:   Image     Comment:   External Document

## 2010-12-17 NOTE — Consult Note (Signed)
Summary: [name]  [facility]   Imported By: Bonner Puna 05/01/2010 14:35:45  _____________________________________________________________________  External Attachment:    Type:   Image     Comment:   External Document

## 2010-12-17 NOTE — Progress Notes (Signed)
Summary: Patient assist meds arrived via [facility] mail for Jan  Phone Note Refill Request      Prescriptions: NORVIR 100 MG TABS (RITONAVIR) Take 1 tablet by mouth once a day  #30 x 0   Entered by:   Canary Brim  BS,CPht II,MPH   Authorized by:   Bobby Rumpf MD   Signed by:   Canary Brim  BS,CPht II,MPH on 11/29/2009   Method used:   Samples Given   RxID:   [id number] ISENTRESS 400 MG TABS (RALTEGRAVIR POTASSIUM) Take 1 tablet by mouth two times a day  #60 x 0   Entered by:   Canary Brim  BS,CPht II,MPH   Authorized by:   Bobby Rumpf MD   Signed by:   Canary Brim  BS,CPht II,MPH on 11/29/2009   Method used:   Samples Given   RxID:   [id number] BACTRIM DS 800-160 MG  TABS (SULFAMETHOXAZOLE-TRIMETHOPRIM) one by mouth mwf  #12 x 0   Entered by:   Canary Brim  BS,CPht II,MPH   Authorized by:   Bobby Rumpf MD   Signed by:   Canary Brim  BS,CPht II,MPH on 11/29/2009   Method used:   Samples Given   RxID:   [id number] TRUVADA 200-300 MG TABS (EMTRICITABINE-TENOFOVIR) Take 1 tablet by mouth once a day  #30 x 0   Entered by:   Canary Brim  BS,CPht II,MPH   Authorized by:   Bobby Rumpf MD   Signed by:   Canary Brim  BS,CPht II,MPH on 11/29/2009   Method used:   Samples Given   RxID:   [id number] REYATAZ 300 MG CAPS (ATAZANAVIR SULFATE) Take 1 tablet by mouth once a day  #30 x 0   Entered by:   Canary Brim  BS,CPht II,MPH   Authorized by:   Bobby Rumpf MD   Signed by:   Canary Brim  BS,CPht II,MPH on 11/29/2009   Method used:   Samples Given   RxID:   [id number]  Patient Assist Medication Verification: Medication name: SMZ-TMP DS 800/160mg  RX # [id number] Tech approval:MLD   Patient Assist Medication Verification: Medication:Truvada Lot# [id number] Exp Date:06 2014 Tech approval:MLD                Patient Assist Medication Verification: [id number] 400mg  [id number] Exp Date:12 2012 Tech approval:MLD            Patient Assist Medication Verification: [id number] 100mg  [id number] Exp Date:17 Jan 2011 Tech approval:MLD                Patient Assist Medication Verification: Medication:Reyataz 300mg  [id number] Exp Date:Oct 2012 Tech approval:MLD Call placed to patient with message that assistance medications are ready for pick-up. Canary Brim  BS,CPht II,MPH  November 29, 2009 4:41 PM

## 2010-12-17 NOTE — Progress Notes (Signed)
  Phone Note Outgoing Call   Call placed by: Jarrett Ables CMA,  October 01, 2010 5:08 PM Call placed to: [place] Summary of Call: Spoke with Pharmacist [facility] called all medications in to with 6 refills. [facility] phone number is [phone] Initial call taken by: Jarrett Ables CMA,  October 01, 2010 5:10 PM

## 2010-12-17 NOTE — Progress Notes (Signed)
Summary: refill/mld  Phone Note From Pharmacy   Caller: [facility] Reason for Call: Needs renewal Summary of Call: Received a phone call from [facility] requesting a new prescription for patient's Reyataz.  they ask if approved you call [phone] or fax to [phone] with a new prescription. Initial call taken by: Canary Brim  BS,CPht II,MPH,  July 24, 2010 4:50 PM    Prescriptions: REYATAZ 300 MG CAPS (ATAZANAVIR SULFATE) Take 1 tablet by mouth once a day  #30 x 6   Entered by:   Canary Brim  BS,CPht II,MPH   Authorized by:   Bobby Rumpf MD   Signed by:   Canary Brim  BS,CPht II,MPH on 07/24/2010   Method used:   Telephoned to ...       [facility] [address] (retail)       [address]       [place], [place]  [address]       Ph: [phone]       Fax: [phone]   [id number]  It was called into [facility] at the apove number written int he summary of call. Left message on pharmacy voicemail. Canary Brim  BS,CPht II,MPH  July 24, 2010 4:58 PM

## 2010-12-17 NOTE — Letter (Signed)
Summary: [place]  [facility]   Imported By: Bonner Puna 01/17/2010 16:57:14  _____________________________________________________________________  External Attachment:    Type:   Image     Comment:   External Document

## 2010-12-17 NOTE — Progress Notes (Signed)
Summary: Pt assist meds arriv via [facility] for May  Phone Note Refill Request      Prescriptions: NORVIR 100 MG TABS (RITONAVIR) Take 1 tablet by mouth once a day  #30 x 0   Entered by:   Canary Brim  BS,CPht II,MPH   Authorized by:   Bobby Rumpf MD   Signed by:   Canary Brim  BS,CPht II,MPH on 03/25/2010   Method used:   Samples Given   RxID:   [id number] ISENTRESS 400 MG TABS (RALTEGRAVIR POTASSIUM) Take 1 tablet by mouth two times a day  #60 x 0   Entered by:   Canary Brim  BS,CPht II,MPH   Authorized by:   Bobby Rumpf MD   Signed by:   Canary Brim  BS,CPht II,MPH on 03/25/2010   Method used:   Samples Given   RxID:   [id number] BACTRIM DS 800-160 MG  TABS (SULFAMETHOXAZOLE-TRIMETHOPRIM) one by mouth mwf  #12 x 0   Entered by:   Canary Brim  BS,CPht II,MPH   Authorized by:   Bobby Rumpf MD   Signed by:   Canary Brim  BS,CPht II,MPH on 03/25/2010   Method used:   Samples Given   RxID:   [id number] TRUVADA 200-300 MG TABS (EMTRICITABINE-TENOFOVIR) Take 1 tablet by mouth once a day  #30 x 0   Entered by:   Canary Brim  BS,CPht II,MPH   Authorized by:   Bobby Rumpf MD   Signed by:   Canary Brim  BS,CPht II,MPH on 03/25/2010   Method used:   Samples Given   RxID:   [id number] REYATAZ 300 MG CAPS (ATAZANAVIR SULFATE) Take 1 tablet by mouth once a day  #30 x 0   Entered by:   Canary Brim  BS,CPht II,MPH   Authorized by:   Bobby Rumpf MD   Signed by:   Canary Brim  BS,CPht II,MPH on 03/25/2010   Method used:   Samples Given   RxID:   [id number]  Patient Assist Medication Verification: Medication name: SMZ-TMP DS 800/160mg  RX # [id number] Tech approval:MLD   Patient Assist Medication Verification: [id number] 300mg  Lot# [id number] Exp Date:Mar 2013 Tech approval:MLD                Patient Assist Medication Verification: Medication:Isentress 400mg  [id number] Exp Date:05 2013 Tech approval:MLD      Patient Assist Medication Verification: [id number] 100mg  [id number] Exp Date:19 Jul 2011 Tech approval:MLD   Patient Assist Medication Verification: Medication: Truvada Lot# [id number] Exp Date:10 2014 Tech approval:MLD Call placed to patient with message that assistance medications are ready for pick-up. Patient states he will pick up meds during his 10am appt on Wednesday, Mar 27, 2010 Canary Brim  BS,CPht II,MPH  Mar 25, 2010 3:27 PM                                  '

## 2010-12-17 NOTE — Assessment & Plan Note (Signed)
Summary: 46MONTH F/U/VS   CC:  1 month follow up.  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  His prev CD4 was 100, VL 2770 (09-05-08). 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 has completed 6 weeks of xrt.  states his lymphoma is doing well. he only needs to have his dentures adjusted.  Had L [facility] Feb 18th, 2011 Had R [facility] Apr 03, 2010 Has been donig well, still has some pain. getting PT every day, nurse comes by 2x/week. having night sweats.   Preventive Screening-Counseling & Management  Alcohol-Tobacco     Alcohol drinks/day: 0     Smoking Status: current     Smoking Cessation Counseling: yes     Packs/Day: 0.5     Cans of tobacco/week: no     Passive Smoke Exposure: no  Caffeine-Diet-Exercise     Caffeine use/day: 0     Does Patient Exercise: no     Type of exercise: push-ups/PT     Times/week: 2  Safety-Violence-Falls     Seat Belt Use: yes   Updated Prior Medication List: REYATAZ 300 MG CAPS (ATAZANAVIR SULFATE) Take 1 tablet by mouth once a day TRUVADA 200-300 MG TABS (EMTRICITABINE-TENOFOVIR) Take 1 tablet by mouth once a day PERCOCET 10-650 MG TABS (OXYCODONE-ACETAMINOPHEN) one to two every 6 hours as needed pain ISENTRESS 400 MG TABS (RALTEGRAVIR POTASSIUM) Take 1 tablet by mouth two times a day NORVIR 100 MG TABS (RITONAVIR) Take 1 tablet by mouth once a day METOPROLOL TARTRATE 25 MG TABS (METOPROLOL TARTRATE) Take 1 tablet by mouth two times a day COUMADIN 7.5 MG TABS (WARFARIN SODIUM) Take 1 tablet by mouth once a day  Current Allergies (reviewed today): ! PCN Past History:  Past medical, surgical, family and social histories (including risk factors) reviewed, and no changes noted (except as noted below).  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 Family History of CAD Male 1st degree relative <60 Family History Diabetes 1st degree relative  Social History: Reviewed history from 03/27/2010 and no changes required. Current Smoker- 1/2ppd Alcohol use-yes Drug use-no on Disability, still driving some.   Review of Systems       wt down 7#, states he always loses after surgery.   Vital Signs:  Patient profile:   50 year old male Height:      65 inches (165.10 cm) Weight:      156.8 pounds (71.27 kg) BMI:     26.19 Temp:     98.2 degrees F (36.78 degrees C) oral Pulse rate:   55 / minute BP sitting:   144 / 82  (right arm)  Vitals Entered By: Rocky Morel) (April 29, 2010 2:43 PM) CC: 1 month follow up Pain Assessment Patient in pain? yes     Location: right hip Intensity: 5 Type: aching Onset of pain  Constant Nutritional Status BMI of 25 - 29 = overweight Nutritional Status Detail appetite is good per patient  Have you ever been in a relationship where you felt threatened, hurt or afraid?No   Does  patient need assistance? Functional Status Self care Ambulation Normal   Physical Exam  General:  well-developed, well-nourished, and well-hydrated.   Eyes:  pupils equal, pupils round, and pupils reactive to light.   Mouth:  pharynx pink and moist and no exudates.   Neck:  no masses.   Lungs:  normal respiratory effort and normal breath sounds.   Heart:  normal rate, regular rhythm, and no murmur.   Abdomen:  soft, non-tender, and normal bowel sounds.          Medication Adherence: 04/29/2010   Adherence to medications reviewed with patient. Counseling to provide adequate adherence provided   Prevention For Positives: 04/29/2010   Safe sex practices discussed with patient. Condoms offered.                             Impression  & Recommendations:  Problem # 1:  HIV DISEASE (ICD-042) doing well. will recheck his #s. offered condoms. return to clinic 4 months.   Problem # 2:  LYMPHOMA (ICD-202.80) this appears stable (he states that his jaw shrunk and he has to get new dentures). will cont to watch (some concern about night sweats).   Problem # 3:  HYPERTENSION (ICD-401.9) will cont his lopressor, states his BP is 120s when home RN comes.   His updated medication list for this problem includes:    Metoprolol Tartrate 25 Mg Tabs (Metoprolol tartrate) .Marland Kitchen... 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 hopefully off narcotics soon. will likely stop coumadin this week.   Medications Added to Medication List This Visit: 1)  Coumadin 7.5 Mg Tabs (Warfarin sodium) .... Take 1 tablet by mouth once a day  Other Orders: T-CD4SP ([place]) (CD4SP) T-HIV Viral Load [phone]) T-Comprehensive Metabolic Panel (A999333) T-Lipid Profile [phone] T-CBC w/Diff [phone]) T-RPR (Syphilis) [id number]) Est. Patient Level IV [id number])  Appended Document: Orders Update states he has worsening vision   Clinical Lists Changes  Orders: Added new Referral order of Ophthalmology Referral (Ophthalmology) - Signed

## 2010-12-17 NOTE — Progress Notes (Signed)
Summary: [facility]/pt assist meds arrived for Feb  Phone Note Refill Request      Prescriptions: NORVIR 100 MG TABS (RITONAVIR) Take 1 tablet by mouth once a day  #30 x 0   Entered by:   Canary Brim  BS,CPht II,MPH   Authorized by:   Bobby Rumpf MD   Signed by:   Canary Brim  BS,CPht II,MPH on 12/29/2009   Method used:   Samples Given   RxID:   [id number] ISENTRESS 400 MG TABS (RALTEGRAVIR POTASSIUM) Take 1 tablet by mouth two times a day  #60 x 0   Entered by:   Canary Brim  BS,CPht II,MPH   Authorized by:   Bobby Rumpf MD   Signed by:   Canary Brim  BS,CPht II,MPH on 12/29/2009   Method used:   Samples Given   RxID:   [id number] BACTRIM DS 800-160 MG  TABS (SULFAMETHOXAZOLE-TRIMETHOPRIM) one by mouth mwf  #12 x 0   Entered by:   Canary Brim  BS,CPht II,MPH   Authorized by:   Bobby Rumpf MD   Signed by:   Canary Brim  BS,CPht II,MPH on 12/29/2009   Method used:   Samples Given   RxID:   [id number] TRUVADA 200-300 MG TABS (EMTRICITABINE-TENOFOVIR) Take 1 tablet by mouth once a day  #30 x 0   Entered by:   Canary Brim  BS,CPht II,MPH   Authorized by:   Bobby Rumpf MD   Signed by:   Canary Brim  BS,CPht II,MPH on 12/29/2009   Method used:   Samples Given   RxID:   [id number] REYATAZ 300 MG CAPS (ATAZANAVIR SULFATE) Take 1 tablet by mouth once a day  #30 x 0   Entered by:   Canary Brim  BS,CPht II,MPH   Authorized by:   Bobby Rumpf MD   Signed by:   Canary Brim  BS,CPht II,MPH on 12/29/2009   Method used:   Samples Given   RxID:   [id number]  Patient Assist Medication Verification: Medication name: SMZ-TMP DS 800/160mg  RX # [id number] Tech approval:MLD   Patient Assist Medication Verification: [id number] 300mg  [id number] Exp Date:Oct 2012 Tech approval:MLD                Patient Assist Medication Verification: [id number] 400mg  [id number] Exp Date:02 2013 Tech approval:MLD                Patient Assist Medication Verification: Medication:Truvada Lot# [id number] Exp Date:07 2014 Tech approval:MLD                Patient Assist Medication Verification: [id number] 100mg  [id number] Exp Date:20 Mar 2011 Tech approval:MLD Will call pt on Monday 12/31/09 Canary Brim  BS,CPht II,MPH  December 29, 2009 10:43 AM                  Appended Document: [employer]/pt assist meds arrived for Feb Prescription/Samples picked up by: patient

## 2010-12-17 NOTE — Consult Note (Signed)
Summary: [facility]  [facility]   Imported By: Bonner Puna 02/26/2010 15:43:51  _____________________________________________________________________  External Attachment:    Type:   Image     Comment:   External Document

## 2010-12-17 NOTE — Miscellaneous (Signed)
  Clinical Lists Changes  Observations: Added new observation of YEARAIDSPOS: 2006  (10/08/2010 15:43)

## 2010-12-17 NOTE — Consult Note (Signed)
Summary: [facility]  [facility]   Imported By: Bonner Puna 12/20/2009 13:46:50  _____________________________________________________________________  External Attachment:    Type:   Image     Comment:   External Document

## 2010-12-17 NOTE — Consult Note (Signed)
Summary: [facility]  [facility]   Imported By: Bonner Puna 12/05/2009 15:11:38  _____________________________________________________________________  External Attachment:    Type:   Image     Comment:   External Document

## 2010-12-17 NOTE — Consult Note (Signed)
Summary: Consultation Report  Consultation Report   Imported By: Bonner Puna 12/12/2009 15:28:26  _____________________________________________________________________  External Attachment:    Type:   Image     Comment:   External Document

## 2010-12-17 NOTE — Progress Notes (Signed)
Summary: Pt assist meds arrived via [facility] for Apr  Phone Note Refill Request      Prescriptions: NORVIR 100 MG TABS (RITONAVIR) Take 1 tablet by mouth once a day  #30 x 0   Entered by:   Canary Brim  BS,CPht II,MPH   Authorized by:   Bobby Rumpf MD   Signed by:   Canary Brim  BS,CPht II,MPH on 02/22/2010   Method used:   Samples Given   RxID:   [id number] ISENTRESS 400 MG TABS (RALTEGRAVIR POTASSIUM) Take 1 tablet by mouth two times a day  #60 x 0   Entered by:   Canary Brim  BS,CPht II,MPH   Authorized by:   Bobby Rumpf MD   Signed by:   Canary Brim  BS,CPht II,MPH on 02/22/2010   Method used:   Samples Given   RxID:   [id number] BACTRIM DS 800-160 MG  TABS (SULFAMETHOXAZOLE-TRIMETHOPRIM) one by mouth mwf  #12 x 0   Entered by:   Canary Brim  BS,CPht II,MPH   Authorized by:   Bobby Rumpf MD   Signed by:   Canary Brim  BS,CPht II,MPH on 02/22/2010   Method used:   Samples Given   RxID:   [id number] TRUVADA 200-300 MG TABS (EMTRICITABINE-TENOFOVIR) Take 1 tablet by mouth once a day  #30 x 0   Entered by:   Canary Brim  BS,CPht II,MPH   Authorized by:   Bobby Rumpf MD   Signed by:   Canary Brim  BS,CPht II,MPH on 02/22/2010   Method used:   Samples Given   RxID:   [id number] REYATAZ 300 MG CAPS (ATAZANAVIR SULFATE) Take 1 tablet by mouth once a day  #30 x 0   Entered by:   Canary Brim  BS,CPht II,MPH   Authorized by:   Bobby Rumpf MD   Signed by:   Canary Brim  BS,CPht II,MPH on 02/22/2010   Method used:   Samples Given   RxID:   [id number]  Patient Assist Medication Verification: Medication name: SMZ-TMP DS 800/160mg  RX # [id number] Tech approval:MLD   Patient Assist Medication Verification: Medication:Truvada Lot# [id number] Exp Date:09 2014 Tech approval:MLD                Patient Assist Medication Verification: [id number] 300mg  Lot# [id number] Exp Date:Jan 2013 Tech approval:MLD                  Patient Assist Medication Verification: Medication: Isentress 400mg  Lot# [id number] Exp Date:03 2013 Tech approval:MLD   Patient Assist Medication Verification: Medication: Norvir 100mg  [id number] Exp Date:18 Aug 2011 Tech approval:MLD  Call placed to patient with message that assistance medications are ready for pick-up. Left message on VM for patient to call Verdis Frederickson [phone] Canary Brim  BS,CPht II,MPH  February 22, 2010 11:52 AM

## 2010-12-17 NOTE — Letter (Signed)
Summary: Referral: Margot Ables  Referral: Margot Ables   Imported By: Bonner Puna 08/09/2010 09:24:09  _____________________________________________________________________  External Attachment:    Type:   Image     Comment:   External Document

## 2010-12-17 NOTE — Progress Notes (Signed)
Summary: Approved for Hip surgery   Phone Note Call from Patient   Summary of Call: He has been approved for hip surgery.  Dr Eddie Dibbles will do the surgery.  He set up a surgery date.  Pt was advised once he has the surgery that physician will prescribe his pain medication.  Dr [name] only agreed to fill pain meds until he could get surgery.  This will be his last refill.  Orland Mustard RN  December 14, 2009 2:37 PM   Follow-up for Phone Call        ok per Dr [name]. Orland Mustard RN  December 17, 2009 3:01 PM

## 2010-12-17 NOTE — Consult Note (Signed)
Summary: [facility]  [facility]   Imported By: Bonner Puna 07/10/2010 12:23:51  _____________________________________________________________________  External Attachment:    Type:   Image     Comment:   External Document

## 2010-12-17 NOTE — Assessment & Plan Note (Signed)
Summary: F/U OV/VS   CC:  f/u .  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  His prev CD4 was 100, VL 2770 (09-05-08). 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 has completed 6 weeks of xrt.  states his lymphoma is doing well. he only needs to have his dentures adjusted.  Had L THR Feb 18th, 2011 Had R THR Apr 03, 2010 states he has been having chest pain. was seen by EMS 2 months ago and taken to hospital but never seen. so he left.  he has been having apin wth exertion. relieved with rest. no diaphoresis. pain starts in his back. no nausea. has felt in his left arm.   Preventive Screening-Counseling & Management  Alcohol-Tobacco     Alcohol drinks/day: <1     Smoking Status: current   Updated Prior Medication List: REYATAZ 300 MG CAPS (ATAZANAVIR SULFATE) Take 1 tablet by mouth once a day TRUVADA 200-300 MG TABS (EMTRICITABINE-TENOFOVIR) Take 1 tablet by mouth once a day ISENTRESS 400 MG TABS (RALTEGRAVIR POTASSIUM) Take 1 tablet by mouth two times a day NORVIR 100 MG TABS (RITONAVIR) Take 1 tablet by mouth once a day METOPROLOL TARTRATE 25 MG TABS (METOPROLOL TARTRATE) Take 1 tablet by mouth two times a day COUMADIN 7.5 MG TABS (WARFARIN SODIUM) Take 1 tablet by mouth once a day  Current Allergies: ! PCN Past History:  Past medical, surgical, family and social histories (including risk factors) reviewed, and no changes noted (except as noted below).  Past Medical History: Reviewed history from 04/29/2010 and no changes required. 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 Family History of CAD Male 1st degree relative <60 Family  History Diabetes 1st degree relative  Social History: Reviewed history from 04/29/2010 and no changes required. Current Smoker- 1/2ppd Alcohol use-yes Drug use-no on Disability, still driving some.   Vital Signs:  Patient profile:   50 year old male Height:      65 inches Weight:      166.50 pounds BMI:     27.81 Temp:     97.7 degrees F oral Pulse rate:   64 / minute BP sitting:   161 / 78  (right arm) Cuff size:   regular  Vitals Entered By: Enid Skeens, CMA (October 01, 2010 3:39 PM) CC: f/u  Is Patient Diabetic? No   Physical Exam  General:  well-developed, well-nourished, and well-hydrated.   Eyes:  pupils equal, pupils round, and pupils reactive to light.   Mouth:  pharynx pink and moist and no exudates.  mild angular chelitis Neck:  no masses.   Chest Wall:  no tenderness.   Lungs:  normal respiratory effort and normal breath sounds.   Heart:  normal rate, regular rhythm, and no murmur.   Abdomen:  soft, non-tender, and normal bowel sounds.   Extremities:  no edema        Medication Adherence: 10/01/2010   Adherence to medications reviewed with patient. Counseling to provide adequate adherence provided   Prevention For Positives: 10/01/2010   Safe sex practices discussed with patient. Condoms  offered.                             Impression & Recommendations:  Problem # 1:  HIV DISEASE (ICD-042)  he is doing well. offered condoms. given flu shot. have him see Barb for assitance with obtaining his meds. return to clinic 3 months with labs prior. get genotype then due to his irregularity taking his meds.   Orders: Cardio-Pulmonary Stress Test Referral (Cardio-Pulmon)  Problem # 2:  CHEST PAIN-UNSPECIFIED (ICD-786.50)  hsi ECG is NSR 62 bpm. no acute changes (? minimal baseline up V2-4). will have him eval by CV for GXT.   Orders: Cardio-Pulmonary Stress Test Referral (Cardio-Pulmon)  Problem # 3:  LYMPHOMA (ICD-202.80) has not had f/u with  Onc. States at his last visit was told it was inactive.   Other Orders: Est. Patient Level IV [id number]) Future Orders: T-CD4SP ([facility]) (CD4SP) ... 12/30/2010 T-HIV Viral Load [phone]) ... 12/30/2010 T-Comprehensive Metabolic Panel ([id number]) ... 12/30/2010 T-CBC w/Diff [id number]) ... 12/30/2010 T-HIV Genotype [id number]) ... 12/30/2010 T-RPR (Syphilis) [phone]) ... 12/30/2010 T-Lipid Profile [phone]) ... 12/30/2010  Appended Document: F/U OV/VS   Immunizations Administered:  Influenza Vaccine # 1:    Vaccine Type: Fluvax 3+    Site: right deltoid    Mfr: novartis    Dose: 0.5 ml    Route: IM    Given by: Enid Skeens, CMA    Exp. Date: 02/16/2011    Lot #: [id number]    VIS given: 06/11/10 version given October 01, 2010.  Flu Vaccine Consent Questions:    Do you have a history of severe allergic reactions to this vaccine? no    Any prior history of allergic reactions to egg and/or gelatin? no    Do you have a sensitivity to the preservative Thimersol? no    Do you have a past history of Guillan-Barre Syndrome? no    Do you currently have an acute febrile illness? no    Have you ever had a severe reaction to latex? no    Vaccine information given and explained to patient? yes

## 2010-12-17 NOTE — Progress Notes (Signed)
Summary: Pt assist meds arrived for Mar  Phone Note Refill Request      Prescriptions: NORVIR 100 MG TABS (RITONAVIR) Take 1 tablet by mouth once a day  #30 x 0   Entered by:   Canary Brim  BS,CPht II,MPH   Authorized by:   Bobby Rumpf MD   Signed by:   Canary Brim  BS,CPht II,MPH on 02/05/2010   Method used:   Samples Given   RxID:   [id number] ISENTRESS 400 MG TABS (RALTEGRAVIR POTASSIUM) Take 1 tablet by mouth two times a day  #60 x 0   Entered by:   Canary Brim  BS,CPht II,MPH   Authorized by:   Bobby Rumpf MD   Signed by:   Canary Brim  BS,CPht II,MPH on 02/05/2010   Method used:   Samples Given   RxID:   [id number] BACTRIM DS 800-160 MG  TABS (SULFAMETHOXAZOLE-TRIMETHOPRIM) one by mouth mwf  #12 x 0   Entered by:   Canary Brim  BS,CPht II,MPH   Authorized by:   Bobby Rumpf MD   Signed by:   Canary Brim  BS,CPht II,MPH on 02/05/2010   Method used:   Samples Given   RxID:   [id number] TRUVADA 200-300 MG TABS (EMTRICITABINE-TENOFOVIR) Take 1 tablet by mouth once a day  #30 x 0   Entered by:   Canary Brim  BS,CPht II,MPH   Authorized by:   Bobby Rumpf MD   Signed by:   Canary Brim  BS,CPht II,MPH on 02/05/2010   Method used:   Samples Given   RxID:   [id number] REYATAZ 300 MG CAPS (ATAZANAVIR SULFATE) Take 1 tablet by mouth once a day  #30 x 0   Entered by:   Canary Brim  BS,CPht II,MPH   Authorized by:   Bobby Rumpf MD   Signed by:   Canary Brim  BS,CPht II,MPH on 02/05/2010   Method used:   Samples Given   RxID:   [id number]  Patient Assist Medication Verification: Medication name: SMZ-TMP DS 800/160mg  RX # [id number] Tech approval:MLD   Patient Assist Medication Verification: [id number] 100mg  [id number] Exp Date:22 Mar 2011 Tech approval:MLD                Patient Assist Medication Verification: Medication: Reyataz 300mg  [id number] Exp Date:Nov 2012 Tech approval:MLD                Patient Assist Medication Verification: Medication:Truvada Lot# [id number] Exp Date:09 2014 Tech approval:MLD                Patient Assist Medication Verification: Medication:Isentress 400mg  [id number] Exp Date:03 2013 Tech approval:MLD Call placed to patient with message that assistance medications are ready for pick-up. Canary Brim  BS,CPht II,MPH  February 05, 2010 2:33 PM

## 2010-12-17 NOTE — Letter (Signed)
Summary: [facility]  Of Medical Assistance: [facility]   [facility]  Of Medical Assistance: [facility]   Imported By: Bonner Puna 02/04/2010 14:28:43  _____________________________________________________________________  External Attachment:    Type:   Image     Comment:   External Document

## 2010-12-19 NOTE — Assessment & Plan Note (Signed)
Summary: np6/chest pains-mb   Visit Type:  Follow-up Primary Provider:  Bobby Rumpf MD  CC:  chest pain.  History of Present Illness: The patient presents for presents for evaluation of chest discomfort. He has no prior cardiac history. 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  He describes as substernal heaviness that has been as severe as 10 out of 10 in intensity. He was presented to the emergency room but left against medical advice when he took too long to be seen. He says typically the discomfort goes away when he stops what he is doing. It is not happening at rest. He is not describing associated symptoms such as nausea, vomiting or diaphoresis. He hasn't had neck radiation or arm discomfort. He never had this discomfort in the past. He does think it is getting better rather than worse. He never had any prior cardiac testing or workup of this.  Current Medications (verified): 1)  Reyataz 300 Mg Caps (Atazanavir Sulfate) .... Take 1 Tablet By Mouth Once A Day 2)  Truvada 200-300 Mg Tabs (Emtricitabine-Tenofovir) .... Take 1 Tablet By Mouth Once A Day 3)  Isentress 400 Mg Tabs (Raltegravir Potassium) .... Take 1 Tablet By Mouth Two Times A Day 4)  Norvir 100 Mg Tabs (Ritonavir) .... Take 1 Tablet By Mouth Once A Day 5)  Metoprolol Tartrate 25 Mg Tabs (Metoprolol Tartrate) .... Take 1 Tablet By Mouth Two Times A Day  Allergies (verified): 1)  ! 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   Review of Systems       As stated in the HPI and negative for all other systems.   Vital Signs:  Patient profile:   50 year old male Height:      65 inches Weight:      168 pounds BMI:     28.06 Pulse rate:   48 / minute Resp:     16 per minute BP sitting:   166 / 81  (right arm)  Vitals Entered By: Levora Angel, CNA (November 19, 2010 11:59 AM)  Physical Exam  General:  Well developed, well nourished, in no acute distress. Head:  normocephalic and atraumatic Eyes:  PERRLA/EOM intact; conjunctiva and lids normal. Mouth:  Teeth, gums and palate normal. Oral mucosa normal. Neck:  Neck supple, no JVD. No masses, thyromegaly or abnormal cervical nodes. Chest Wall:  no deformities  Lungs:  Clear bilaterally to auscultation and percussion. Abdomen:  Bowel sounds positive; abdomen soft and non-tender without masses, organomegaly, or hernias noted. No hepatosplenomegaly. Msk:  Back normal, normal gait. Muscle strength and tone normal. Extremities:  No clubbing or cyanosis. Neurologic:  Alert and oriented x 3. Skin:  Intact without lesions or rashes. Cervical Nodes:  no significant adenopathy Psych:  Normal affect.   Detailed Cardiovascular Exam  Neck  Carotids: Carotids full and equal bilaterally without bruits.      Neck Veins: Normal, no JVD.    Heart    Inspection: no deformities or lifts noted.      Palpation: normal PMI with no thrills palpable.      Auscultation: regular rate and rhythm, S1, S2 without murmurs, rubs, gallops, or clicks.    Vascular    Abdominal Aorta: no palpable masses, pulsations, or audible bruits.      Femoral Pulses: normal femoral pulses bilaterally.      Pedal Pulses: normal pedal pulses bilaterally.      Radial Pulses: normal radial pulses bilaterally.      Peripheral Circulation: no clubbing,  cyanosis, or edema noted with normal capillary refill.     EKG  Procedure date:  10/01/2010  Findings:      Sinus rhythm, rate 61, axis this was intervals within normal limits, no acute ST-T wave changes  Impression & Recommendations:  Problem # 1:  CHEST PAIN-UNSPECIFIED (ICD-786.50) The patient's chest pain had some features consistent with exertional angina. He has significant cardiovascular risk factors. He does say the symptoms have gotten better rather than worse. I will screen him with an exercise perfusion study. Orders: Nuclear Stress Test (Nuc Stress Test)  Problem # 2:  HYPERTENSION (ICD-401.9) His blood pressure is controlled and he will continue the meds as listed. Orders: Nuclear Stress Test (Nuc Stress Test)  Problem # 3:  TOBACCO USER (ICD-305.1) We discussed at length the need to stop smoking.  Patient Instructions: 1)  Your physician recommends that you schedule a follow-up appointment as needed 2)  Your physician recommends that you continue on your current medications as directed. Please refer to the Current Medication list given to you today. 3)  Your physician has requested that you have an exercise stress myoview.  For further information please visit [email or web address].  Please follow instruction sheet, as given.

## 2010-12-19 NOTE — Progress Notes (Signed)
Summary: pt needs referral  Phone Note Call from Patient Call back at Home Phone [phone]   Caller: Patient Reason for Call: Talk to Nurse, Talk to Doctor Summary of Call: pt has moved to [facility] and needs a referral to a Cardiologist  in the [place] area so he dosen't have to make the trip all the way to [facility] Initial call taken by: Shelda Pal,  December 04, 2010 12:33 PM  Follow-up for Phone Call        Phone Call Completed PT AWARE WILL FORWARD TO DR [facility]  IN [place]. Follow-up by: Devra Dopp, LPN,  January 18, [id number] 12:44 PM  Additional Follow-up for Phone Call Additional follow up Details #1::        I would suggest Dr. Eli Phillips.  Dr Sabino Snipes [address], [place], [place] [address] [phone] Additional Follow-up by: Minus Breeding, MD, [facility],  December 08, 2010 7:16 PM

## 2010-12-19 NOTE — Letter (Signed)
Summary: [facility]  [address] [place]   [place], [name] [address]   Phone: [phone]  Fax: [phone]          October 22, 2010  [name] [address] [place], [place]  [address]  Dear [name]:  Your Cardiologist appointment is scheduled for:    MD:     Dr. Minus Breeding   Date:     Tuesday, November 19, 2009   Time:     11:45 AM, arrive at 11:30 to complete paperwork   Address:   [facility]       [address] [place]       [place], [place], [address]   Telephone:   [phone]  Please call their office if you need to change or cancel this appointment at least 24 hours in advance of the appointment.  I hope your have a Safe and Happy Holiday Season!   Sincerely,    [facility]

## 2010-12-19 NOTE — Assessment & Plan Note (Signed)
Summary: 101month f/u [mkj]   Primary Provider:  Bobby Rumpf MD  CC:  f/u .  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  His prev CD4 was 100, VL 2770 (09-05-08). 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 has completed 6 weeks of xrt.  states his lymphoma is doing well. he only needs to have his dentures adjusted.  Had L THR Feb 18th, 2011 Had R [facility] Apr 03, 2010 Seen today by CV for exertional angina. to have exercise perfusion study. being doing well. states that he has return of his angular chelitis. prev had diflucan and would like refill.   Preventive Screening-Counseling & Management  Alcohol-Tobacco     Alcohol drinks/day: <1     Alcohol type: occasional beer     Smoking Status: current     Smoking Cessation Counseling: yes     Packs/Day: 0.5     Cans of tobacco/week: no     Passive Smoke Exposure: no      Drug Use:  yes.     Updated Prior Medication List: REYATAZ 300 MG CAPS (ATAZANAVIR SULFATE) Take 1 tablet by mouth once a day TRUVADA 200-300 MG TABS (EMTRICITABINE-TENOFOVIR) Take 1 tablet by mouth once a day ISENTRESS 400 MG TABS (RALTEGRAVIR POTASSIUM) Take 1 tablet by mouth two times a day NORVIR 100 MG TABS (RITONAVIR) Take 1 tablet by mouth once a day METOPROLOL TARTRATE 25 MG TABS (METOPROLOL TARTRATE) Take 1 tablet by mouth two times a day  Current Allergies (reviewed today): ! 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 drank execesively over new years Disability, still driving some.  Drug use-yes- occas marijuana Drug Use:  yes  Review of Systems       states his hips have been doing well. apetite better with  smoking marijuana.  Vital Signs:  Patient profile:   50 year old male Height:      65 inches (165.10 cm) Weight:      167 pounds (75.91 kg) BMI:     27.89 Temp:     97.5 degrees F (36.39 degrees C) oral Pulse rate:   47 / minute BP sitting:   164 / 109  (left arm)  Vitals Entered By: Jarrett Ables CMA (November 19, 2010 2:21 PM) CC: f/u  Is Patient Diabetic? No Pain Assessment Patient in pain? no      Nutritional Status BMI of 25 - 29 = overweight Nutritional Status Detail nl  Does patient need assistance? Functional Status Self care Ambulation Normal   Physical Exam  General:  well-developed, well-nourished, and well-hydrated.   Eyes:  pupils equal, pupils round, and pupils reactive to light.   Mouth:  pharynx pink and moist and no exudates.   Neck:  no masses.   Lungs:  normal respiratory effort and normal breath sounds.   Heart:  normal rate, regular rhythm, and no murmur.   Abdomen:  soft, non-tender, and normal bowel sounds.          Medication Adherence: 11/19/2010   Adherence to medications reviewed with patient. Counseling to provide adequate adherence provided  Prevention For Positives: 11/19/2010   Safe sex practices discussed with patient. Condoms offered.                             Impression & Recommendations:  Problem # 1:  HIV DISEASE (ICD-042)  will recheck his labs today. he feels very well. offered condoms. states he gets flu shot everytime he comes to office. having sex, using condoms. return to clinic 3-4 months.  Diagnostics Reviewed:  HIV: CDC-defined AIDS (03/26/2009)   CD4: 270 (04/30/2010)   WBC: 6.5 (04/29/2010)   Hgb: 11.5 (04/29/2010)   HCT: 34.7 (04/29/2010)   Platelets: 498 (04/29/2010) HIV genotype: * (08/03/2009)   HIV-1 RNA: <48 copies/mL (04/29/2010)   HBSAg: NO (01/11/2007)  His updated medication list for this problem includes:    Fluconazole 100 Mg Tabs (Fluconazole) .Marland Kitchen... Take 1 tablet by mouth once a  day  Problem # 2:  TOBACCO USER (ICD-305.1) encouraged to quit.   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   Problem # 4:  CHEST PAIN-UNSPECIFIED (ICD-786.50) my great appreciation to Dr Percival Spanish.   Medications Added to Medication List This Visit: 1)  Fluconazole 100 Mg Tabs (Fluconazole) .... Take 1 tablet by mouth once a day  Other Orders: T-CD4SP [facility]) (CD4SP) T-HIV Viral Load [phone]) T-Comprehensive Metabolic Panel ([id number]) T-Lipid Profile [phone]) T-CBC w/Diff [phone]) T-RPR (Syphilis) [id number]) Est. Patient Level IV [id number])  Prescriptions: FLUCONAZOLE 100 MG TABS (FLUCONAZOLE) Take 1 tablet by mouth once a day  #14 x 3   Entered and Authorized by:   Bobby Rumpf MD   Signed by:   Bobby Rumpf MD on 11/19/2010   Method used:   Electronically to        [facility] (retail)       [address]       [place], [place]  [address]       Ph: [phone]       Fax: [phone]   RxID:   [id number]

## 2011-01-27 LAB — T-HELPER CELL (CD4) - (RCID CLINIC ONLY)
CD4 % Helper T Cell: 13 % — ABNORMAL LOW (ref 33–55)
CD4 T Cell Abs: 280 uL — ABNORMAL LOW (ref 400–2700)

## 2011-02-03 LAB — BASIC METABOLIC PANEL
BUN: 18 mg/dL (ref 6–23)
CO2: 25 mEq/L (ref 19–32)
CO2: 26 mEq/L (ref 19–32)
Calcium: 8.7 mg/dL (ref 8.4–10.5)
Calcium: 9.5 mg/dL (ref 8.4–10.5)
Creatinine, Ser: 1 mg/dL (ref 0.4–1.5)
Creatinine, Ser: 1.47 mg/dL (ref 0.4–1.5)
GFR calc Af Amer: >60 mL/min (ref >60)
GFR calc Af Amer: >60 mL/min (ref >60)
GFR calc non Af Amer: 51 mL/min — ABNORMAL LOW (ref >60)
GFR calc non Af Amer: >60 mL/min (ref >60)
Glucose, Bld: 139 mg/dL — ABNORMAL HIGH (ref 70–99)
Glucose, Bld: 78 mg/dL (ref 70–99)
Potassium: 4.4 mEq/L (ref 3.5–5.1)
Sodium: 135 mEq/L (ref 135–145)
Sodium: 139 mEq/L (ref 135–145)

## 2011-02-03 LAB — CBC
HCT: 40.5 % (ref 39.0–52.0)
Hemoglobin: 11.7 g/dL — ABNORMAL LOW (ref 13.0–17.0)
Hemoglobin: 13.5 g/dL (ref 13.0–17.0)
MCHC: 33.4 g/dL (ref 30.0–36.0)
MCHC: 34.2 g/dL (ref 30.0–36.0)
Platelets: 171 10*3/uL (ref 150–400)
RBC: 3.41 MIL/uL — ABNORMAL LOW (ref 4.22–5.81)
RBC: 3.94 MIL/uL — ABNORMAL LOW (ref 4.22–5.81)
RDW: 14.6 % (ref 11.5–15.5)
RDW: 14.8 % (ref 11.5–15.5)
WBC: 8.6 10*3/uL (ref 4.0–10.5)

## 2011-02-03 LAB — PROTIME-INR
INR: 1.21 (ref 0.00–1.49)
INR: 1.53 — ABNORMAL HIGH (ref 0.00–1.49)
Prothrombin Time: 15.2 seconds (ref 11.6–15.2)
Prothrombin Time: 18.3 seconds — ABNORMAL HIGH (ref 11.6–15.2)

## 2011-02-05 LAB — COMPREHENSIVE METABOLIC PANEL
ALT: 26 U/L (ref 0–53)
AST: 24 U/L (ref 0–37)
Alkaline Phosphatase: 78 U/L (ref 39–117)
CO2: 26 mEq/L (ref 19–32)
Calcium: 8.8 mg/dL (ref 8.4–10.5)
Chloride: 108 mEq/L (ref 96–112)
GFR calc Af Amer: >60 mL/min (ref >60)
GFR calc non Af Amer: >60 mL/min (ref >60)
Potassium: 3.8 mEq/L (ref 3.5–5.1)
Sodium: 138 mEq/L (ref 135–145)
Total Bilirubin: 2 mg/dL — ABNORMAL HIGH (ref 0.3–1.2)

## 2011-02-05 LAB — BASIC METABOLIC PANEL
BUN: 6 mg/dL (ref 6–23)
BUN: 8 mg/dL (ref 6–23)
CO2: 22 mEq/L (ref 19–32)
CO2: 24 mEq/L (ref 19–32)
CO2: 25 mEq/L (ref 19–32)
Calcium: 8 mg/dL — ABNORMAL LOW (ref 8.4–10.5)
Chloride: 100 mEq/L (ref 96–112)
Chloride: 100 mEq/L (ref 96–112)
Creatinine, Ser: 0.86 mg/dL (ref 0.4–1.5)
Creatinine, Ser: 1.18 mg/dL (ref 0.4–1.5)
Glucose, Bld: 130 mg/dL — ABNORMAL HIGH (ref 70–99)
Sodium: 136 mEq/L (ref 135–145)

## 2011-02-05 LAB — PROTIME-INR
INR: 1.18 (ref 0.00–1.49)
Prothrombin Time: 19.4 seconds — ABNORMAL HIGH (ref 11.6–15.2)
Prothrombin Time: 20.7 seconds — ABNORMAL HIGH (ref 11.6–15.2)

## 2011-02-05 LAB — CBC
HCT: 27.4 % — ABNORMAL LOW (ref 39.0–52.0)
HCT: 32.2 % — ABNORMAL LOW (ref 39.0–52.0)
Hemoglobin: 11.1 g/dL — ABNORMAL LOW (ref 13.0–17.0)
Hemoglobin: 13 g/dL (ref 13.0–17.0)
Hemoglobin: 9.5 g/dL — ABNORMAL LOW (ref 13.0–17.0)
MCHC: 34.2 g/dL (ref 30.0–36.0)
MCHC: 34.4 g/dL (ref 30.0–36.0)
MCHC: 34.5 g/dL (ref 30.0–36.0)
MCV: 87.7 fL (ref 78.0–100.0)
MCV: 87.9 fL (ref 78.0–100.0)
Platelets: 175 10*3/uL (ref 150–400)
RBC: 3.13 MIL/uL — ABNORMAL LOW (ref 4.22–5.81)
RBC: 4.32 MIL/uL (ref 4.22–5.81)
RDW: 15.3 % (ref 11.5–15.5)
WBC: 6.3 10*3/uL (ref 4.0–10.5)
WBC: 9.2 10*3/uL (ref 4.0–10.5)

## 2011-02-18 LAB — T-HELPER CELL (CD4) - (RCID CLINIC ONLY): CD4 % Helper T Cell: 15 % — ABNORMAL LOW (ref 33–55)

## 2011-02-20 LAB — CBC
HCT: 40.2 % (ref 39.0–52.0)
MCV: 89.8 fL (ref 78.0–100.0)
Platelets: 212 10*3/uL (ref 150–400)
RBC: 4.48 MIL/uL (ref 4.22–5.81)
WBC: 4.9 10*3/uL (ref 4.0–10.5)

## 2011-02-25 LAB — T-HELPER CELL (CD4) - (RCID CLINIC ONLY)
CD4 % Helper T Cell: 11 % — ABNORMAL LOW (ref 33–55)
CD4 T Cell Abs: 150 uL — ABNORMAL LOW (ref 400–2700)

## 2011-03-03 LAB — GLUCOSE, CAPILLARY: Glucose-Capillary: 92 mg/dL (ref 70–99)

## 2011-03-03 LAB — T-HELPER CELL (CD4) - (RCID CLINIC ONLY): CD4 T Cell Abs: 160 uL — ABNORMAL LOW (ref 400–2700)

## 2011-04-01 NOTE — Op Note (Signed)
[name], [name]               ACCOUNT NO.:  [id number]   MEDICAL RECORD NO.:  [id number]          PATIENT TYPE:  AMB   LOCATION:  DAY                          FACILITY:  [facility]   PHYSICIAN:  Lenn Cal, D.D.S.DATE OF BIRTH:  May 08, 1961   DATE OF PROCEDURE:  06/01/2008  DATE OF DISCHARGE:                               OPERATIVE REPORT   PREOPERATIVE DIAGNOSES:  1. 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  2. Pre-radiation therapy dental protocol.  3. Human immunodeficiency virus positive diagnosis.  4. Chronic periodontitis.  5. Apical periodontitis.  6. Multiple retained root segments.  7. Rampant dental caries.  8. Malocclusion.   POSTOPERATIVE DIAGNOSES:  1. 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  2. Pre-radiation therapy dental protocol.  3. Human immunodeficiency virus positive diagnosis.  4. Chronic periodontitis.  5. Apical periodontitis.  6. Multiple retained root segments.  7. Rampant dental caries.  8. Malocclusion.   OPERATION:  1. Extraction of remaining teeth (tooth #'s 1, 2, 3, 4, 5, 11, 12, 13,      15, 16, 17, 18, 19, 20, 21, 22, 28, 29, 30, and 32)  2. Four-quadrants of alveoloplasty.   SURGEON:  Lenn Cal, D.D.S.   ASSISTANT:  1. Doris Silverthorne, Art therapist.  2. Binnie Kand, Dental Student.   ANESTHESIA:  General anesthesia via nasal endotracheal tube.   MEDICAL DECISION MAKING:  1. Clindamycin 600 mg IV prior to invasive dental procedures.  2. Local anesthesia with a total utilization of 6 Carpules each,      containing 34 mg of lidocaine with 0.017 mg of epinephrine, as well      as 2 Carpules each, containing 9 mg of bupivacaine with 0.009 mg of      epinephrine.   SPECIMENS:  There were #20 teeth that were discarded.   CULTURES:  None.   DRAINS:  None.   COMPLICATIONS:  None.   ESTIMATED BLOOD LOSS:  Was 150 mL.   FLUIDS:  Were 2000 mL lactated Ringer's solution.   INDICATIONS FOR PROCEDURE:  The patient was recently diagnosed with  lymphoma involving the right mandible and soft tissue that was  associated with the right mandible.  The patient was anticipated  radiation therapy.  The patient was seen as part of a pre-radiation  therapy dental protocol evaluation.  The patient was examined and a  treatment plan for extraction of the remaining teeth with  alveoloplasties indicated.  This treatment plan was formulated to  decrease the risks of complications associated with dental infection,  from affecting the patient's systemic health, as well as to prevent  future complications associated with osteoradionecrosis.   FINDINGS:  The patient was examined in the operating room #6.  The teeth  were identified for extraction.  The patient was noted to be affected by  chronic periodontitis, apical periodontitis, multiple retained root  segments, rampant dental caries, malocclusion and overall oral neglect.  The aforementioned necessitated the removal of all remaining teeth with  alveoloplasty as indicated.   DESCRIPTION OF PROCEDURE:  The patient was brought to the main operating  room #6.  The patient was then placed in the supine position on the  operating room table.  The patient was then intubated utilizing a nasal  endotracheal tube.  The patient was then prepped and draped in the usual  manner for a dental medicine procedure.  A time out was performed.  The  patient was identified and the procedure was verified.  A throat pack  was placed at this time.  The oral cavity was then thoroughly examined  with the findings noted above.  The patient was then ready for the  dental medicine procedure as follows:  Local anesthesia was administered  sequentially with a total utilization of 6 Carpules, each containing 34  mg of lidocaine with 0.017 mg of epinephrine, as well as 2 Carpules  each, containing 9 mg of bupivacaine with 0.009 mg of epinephrine.   The  maxillary quadrants were first approached.  The patient was  anesthetized utilizing the Xylocaine with epinephrine via infiltration  on the facial and palatal aspects.  A #15 blade incision was then made  from the maxillary right tuberosity and extended to the mesial #11.  Another surgical incision was then made from the distal of the maxillary  left tuberosity and extended to the mesial #11.  A surgical flap was  then carefully reflected.  Appropriate amounts of buccal and inter-  septal bone was removed at this time.  The teeth were then subluxated  with a series of straight elevators.  Teeth #1 and #3 were then removed  with the #53R forceps.  Teeth #4 and #5 were then removed with the #150  forceps without complications.  The root in the remaining area of #2 was  then removed with rongeurs appropriately.  Teeth #11, #12 and #13 were  then removed with the #150 forceps, without complications.  The coronal  aspect of tooth #15 and entire tooth #16 were then removed with a #53L  forceps without further complications.  This did leave roots in the  areas of #15, however.  These roots were then removed appropriately with  rongeurs.  Alveoloplasty then performed utilizing rongeurs and bone  file.  The excessive tissues in the area between tooth #6 through #11  were then removed, utilizing a series of #15 blade incisions.  Further  alveoloplasty was then performed utilizing rongeurs and bone file.  The  tissues were approximated and trimmed appropriately.  The surgical sites  were then irrigated with copious amounts of sterile saline x2.  The  maxillary left surgical site was then closed from the distal tuberosity  and extended to the mesial #9, utilizing #3-0 chromic gut suture in a  continuous interrupted suture technique x1.  The maxillary right  quadrant was then approached and closed utilizing #3-0 chromic gut  suture in a continuous interrupted suture technique from the distal   tuberosity and extended to the mesial #8.   At this point in time, the mandibular quadrants were approached.  The  patient was given bilateral inferoalveolar nerve blocks utilizing the  bupivacaine with epinephrine.  Further infiltration was achieved  utilizing the lidocaine with epinephrine.  The mandibular left quadrant  was then approached.  A #15 blade incision was then made from the distal  #17 and extended to the distal of #26.  A surgical flap was then  carefully reflected.  Appropriate amounts of buccal and inter-septal  bone is removed at this time.  A #15 blade incision was then made from  the distal #  32 and extended to the mesial of #26.  A surgical flap was  then carefully reflected.  Appropriate amounts of buccal and inter-  septal bone was removed around the remaining lower teeth as needed.  These teeth were then subluxated with a series of straight elevators.  Tooth #32 was then removed with a #23 forceps without complications.  Tooth #30 was then removed with a #23 forceps without complications.  Tooth #28 and #29 were removed with the #151 forceps without  complications.  At this point in time, teeth #22, #21 and #20 were  removed with a #151 forceps without complications.  The coronal aspect  of tooth #19 was removed with a #23 forceps, with the roots remaining.  These roots were then removed with rongeurs appropriately.  Tooth #17  and #18 were then removed utilizing a #17 forceps without further  complications.  At this point in time, an alveoloplasty was then  performed utilizing rongeurs and bone file involving the mandibular  right and mandibular left quadrants.  The tissues were approximated and  trimmed appropriately.  The surgical sites were then irrigated with  copious amounts of sterile saline x2.  The tissues of the mandibular  left quadrant were then further approximated as indicated.  The surgical  site was then closed from the mandibular left #17 and  extended to the  mesial of #23, utilizing #3-0 chromic gut suture in a continuous  interrupted suture technique #1.  The mandibular right quadrant was then  approached and closed from the distal #32 and extended to the mesial of  #26, utilizing #3-0 chromic gut suture in a continuous interrupted  suture technique x1.   At this point in time, the entire mouth was irrigated with copious  amounts of sterile saline.  The patient was examined for complications.  Seeing none, the dental medicine procedure was deemed to be complete.  The throat pack was removed at this time.  A series of 4 x 4 gauze were  placed in the mouth to aid hemostasis.  An oral airway was placed at the  request of the anesthesia team.  The patient was then handed over to the  anesthesia team for final disposition.  After an appropriate amount of  time the patient was extubated and taken to the post-anesthesia care  unit with stable vital signs and good oxygenation level.  All counts  were correct for the dental medicine procedure.  The  patient will be given appropriate pain medication and will be seen in  approximately one week for evaluation for suture removal.  No further  antibiotic therapy is indicated at this time, as per previous discussion  with Dr. Dellis Filbert C. [name] (infectious disease).      Lenn Cal, D.D.S.  Electronically Signed     RFK/MEDQ  D:  06/01/2008  T:  06/01/2008  Job:  [id number]   cc:   Rexene Edison, M.D.  Fax: [phone]   Cletus Gash, MD  Fax: [place]. [name], M.D.  Fax: [phone]   Alyson Locket. Beryle Beams, M.D.  Fax: [phone]

## 2011-04-01 NOTE — Consult Note (Signed)
[name], [name] NO.:  [id number]   MEDICAL RECORD NO.:  [id number]          PATIENT TYPE:  OUT   LOCATION:  XRAY                         FACILITY:  [facility]   PHYSICIAN:  Lenn Cal, D.D.S.DATE OF BIRTH:  07-03-1961   DATE OF CONSULTATION:  05/29/2008  DATE OF DISCHARGE:                                 CONSULTATION   [name] is a 50 year old male referred by Dr. Arloa Koh for  dental consultation.  Patient with recent diagnosis of a lymphoma  involving the right mandible and soft tissues associated with the right  mandible.  Patient with anticipated radiation therapy with Dr. Valere Dross.  The patient is now seen as part of pre-radiation therapy dental protocol  evaluation.   MEDICAL HISTORY.:  1. 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      a.     S/P excisional biopsy by Dr. Almyra Deforest (Oral Surgeon) at       [facility] on March 15, 2008. Pathology    had findings consistent with NK/T-cell lymphoma, nasal type.    b. Anticipated radiation therapy with Dr. Colon Flattery. Rolena Infante to  approximately 4500-5000 cGy.    c. Previous evaluation by Dr. Beryle Beams with no pending  chemotherapy.  1. HIV positive-- initial diagnosis in 2004.  The patient is followed      by Dr. Bobby Rumpf with Infectious Disease.    Patient with approximate CD-4 count of 80 and a viral load of 51,600  per previous testing.  The patient with HAART    therapy as below.  1. Hypertension.  2. Questionable History of lung cancer.  3. 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   ALLERGIES:  Penicillin.   MEDICATIONS:  1. Percocet 5/325 as needed for hip pain.  2. Atazanavir 300 mg daily.  3. Ritinovir 100 mg daily.  4. Truvada 200/300 daily.  5. Bactrim DS on Monday, Wednesday, and Friday.  6. Fluconazole 150 mg daily.   SOCIAL HISTORY:  The  patient is a Administrator.  The patient is single.  Patient has one son who is 76 years old.  Patient with a history of  smoking one to two packs per day.  Patient with a history of heavy use  of alcohol in the past (primarily Woodward Ku), uses beer occasionally  at this time.   FAMILY HISTORY:  Mother and father are both deceased.  Mother died of  gynecological cancer.  Father died of some unknown cancer per the  patient.   FUNCTIONAL ASSESSMENT:  The patient means independent for ADLs at this  time.   REVIEW OF SYSTEMS:  This was reviewed with the patient and is included  in the consultation record.   CHIEF COMPLAINT:  The patient is a pre-radiation therapy dental  evaluation.   HISTORY OF PRESENT ILLNESS:  Pateint had spome dental trauma in April  2009. The patient had a lower canine tooth (#27) extracted.  This tooth  had some granulation  or other type of tissue that remained.  This was  eventually biopsied by the Oral Surgeon, Dr. Almyra Deforest, on March 15, 2008.  This came back with the findings of the NK/T-cell lymphoma  involving that area.  The patient with anticipated radiation therapy.  The patient is now seen as part of a Pre-radiation therapy dental  consulation.   The patient currently denies acute toothaches, swellings or abscesses.  Prior to the extraction for some trauma in April 2009 the patient had  not had any dental treatment for many years.  The patient now presents  for a comprehensive dental evaluation at this time.  The patient does  have a history of upper acrylic partial denture made approximately 6 to  7 years ago.  This is ill-fitting by patient report due to loss of  several teeth on the maxilla.   DENTAL EXAM:  General:  Patient well-developed, well-nourished male in  no acute distress.  Vital Signs:  Blood pressure is 162/94, pulse rate of 73, temperature is  98.2.  EXTRAORAL: There is no significant lymphadenopathy noted at this time.   The patient denies acute TMJ symptoms.  INTRAORAL EXAM: Patient with normal saliva.  There is no evidence of  abscess formation within the mouth.  DENTITION: Multiple missing teeth and retained roots per charting form.  DENTAL CARIES: The patient has rampant dental caries affecting the teeth  as per dental charting form.  PERIODONTAL:  Patient with chronic advanced periodontal disease with  plaque calculus accumulations, gingival recession, and generalized tooth  mobility.  Patient with a moderate to severe bone loss.  ENDONTICS:  The patient currently denies acute pulpitis symptoms but  does have evidence of periapical pathology involving tooth numbers 2,  15, and 19.  CROWN AND BRIDGE:  There are no crown or bridge restorations.  PROSTHODONTICS:  The patient has an existing maxillary acrylic partial  denture which is ill-fitting.  OCCLUSION: Patient with a poor occlusal scheme secondary to multiple  missing teeth, retained root segments, and lack of replacement of the  missing teeth with clinically acceptable dental prosthesis.  RADIOGRAPHIC INTERPRETATION:  Panoramic x-ray was taken and supplemented with a full series of dental  radiographs.   There are multiple missing teeth.  There are multiple retained root  segments.  There are rampant dental caries.  There is supereruption and  drifting of the unopposed teeth into the edentulous areas.  There are  multiple areas of periapical radiolucencies.  There are dental caries  noted.  There is radiographic calculus noted.  There is a poor occlusal  scheme noted.   ASSESSMENT:  1. History of oral neglect.  2. Chronic periodontitis with bone loss.  3. Generalized gingival recession.  4. Generalized tooth mobility.  5. Rampant dental caries.  6. Supereruption and drifting of the unopposed teeth into the      edentulous areas.  7. Multiple missing teeth.  8. Multiple retained root segments.  9. Multiple areas of periapical  radiolucency.  [id number] acrylic partial denture which is ill-fitting.  11.Poor occlusal scheme.  12.HIV diagnosis with potential risk for infection and bleeding.   PLAN/RECOMMENDATIONS:  1. I discussed the risks, benefits, and complication and various      treatment option with the patient relationship to his medical and      dental conditions and anticipated radiation therapy and radiation      therapy side effects to include xerostomia, radiation caries,      trismus, mucositis, taste  changes, gum and jaw bone changes, risk      for infection, bleeding and osteoradionecrosis.  We discussed      various treatment options to include no treatment, total and      subtotal extractions with alveoloplasty, pre-prosthetic surgery as      indicated, periodontal therapy, dental restorations, root canal      therapy, crown or bridge therapy, implant therapy, replacing      missing teeth as indicated.  The patient currently wishes to      proceed with extraction of all remaining teeth with      alveoloplastyand preprosthetic surgery as indicated in the      operating room on June 01, 2008, at 7:30 in the morning.  The      patient will then be evaluated for fabrication of upper and lower      complete dentures by the dentist of his choice approximately 3      months after last radiation dose is complete.  2. Discussion of findings with medical team and Dr. Colon Flattery. Rolena Infante      as indicated.  3. Will obtain preoperative extraction records today.  4. Provided written verbal information on Radiation Therapy and Your      Mouth - today.      Lenn Cal, D.D.S.  Electronically Signed     RFK/MEDQ  D:  05/29/2008  T:  05/29/2008  Job:  [id number]   cc:   Rexene Edison, M.D.  Fax: [place]. Beryle Beams, M.D.  Fax: [phone]   Cletus Gash, MD  Fax: [phone]

## 2011-04-04 NOTE — Discharge Summary (Signed)
[name], [name]                           ACCOUNT NO.:  [id number]   MEDICAL RECORD NO.:  [id number]                   PATIENT TYPE:  INP   LOCATION:  5736                                 FACILITY:  [facility]   PHYSICIAN:  Thomes Lolling, M.D.                 DATE OF BIRTH:  07/31/1961   DATE OF ADMISSION:  07/13/2003  DATE OF DISCHARGE:  07/29/2003                                 DISCHARGE SUMMARY   DISCHARGE DIAGNOSES:  1. Pneumocystis carinii pneumonia.  2. Human immunodeficiency virus positive.  3. Respiratory failure requiring ventilator secondary to #1.  4. Anemia.  5. Sepsis secondary to #1.  6. Renal insufficiency secondary to #5.   DISCHARGE MEDICATIONS:  1. Bactrim DS two tablets q.8h. for one week, then one tablet a day.  2. Azithromycin 1,200 mg a week.  3. Prednisone 20 mg tablets two tablets a day for two days, then one tablet     a day for three days.  4. Temazepam 30 mg q.h.s. p.r.n. insomnia.   FOLLOW UP:  The patient has an appointment with the internal medicine clinic  on September 22 at 3 p.m., and the infectious disease clinic will follow the  patient with an appointment after discharge.   ISSUES AT FOLLOWUP:  The patient needs careful monitoring of his viral load  and CD4 count. The patient also needs followup H&H after discharge.  Discharge hemoglobin was 11.2.   HISTORY OF PRESENT ILLNESS:  The patient is a 50 year old African-American  male who presented to his primary care doctor with hypoxia and diffuse  pulmonary infiltrates. About 10 days prior to admission, he became extremely  dyspneic climbing out of his truck. He began feeling weak and short of  breath which required him to be confined to his bed. He reported a cough  productive of clear mucus, anorexia with some mild nausea and vomiting.   PHYSICAL EXAMINATION:  VITAL SIGNS:  Temperature 99.4, pulse of 106, blood  pressure 142/77, respiratory rate 24. The patient was saturating 92% on 4  liters.  GENERAL:  The patient appeared tired, toxic, weak, but was alert and  oriented x4, cooperative and well nourished.  SKIN:  Without any rashes or bruising, adenopathy was not found.  HEENT:  Conjunctivae are sallow but not icteric. Pupils are reactive. Speech  is clear. Oral mucosa is clear.  NECK:  Supple with no JVD or thyromegaly.  CHEST:  Respirations were with upper airway noises, rhonchi in the deep  posterior bases, shallow tachypnea, speaking short sentences.  CARDIOVASCULAR:  Regular rate and rhythm with no murmurs, rubs, or gallops.  ABDOMEN:  Positive bowel sounds, soft, nontender, with no guarding.  EXTREMITIES:  Without clubbing, cyanosis, or edema.   LABORATORY DATA:  Labs on admission were with an ABG of pH 7.468, pCO2 of  31, pO2 of 52.9, bicarbonate of 24.1. A CBC was with a white blood  cell  count of 11.4, a hemoglobin of 11.5, hematocrit 33.8, and a platelet count  of 325,000. BMET on admission was with a sodium of 141, potassium of 4.2,  chloride of 108, BUN of 20, creatinine 0.7, and a glucose of 103. LFTs on  admission were with an AST of 74, ALT of 48, alkaline phosphatase of 37, and  a total bilirubin of 0.6. HIV test done on the day following admission was  reactive which was confirmed. Chest x-ray done on admission revealed  bilateral air space disease, probable pneumonia versus atypical pulmonary  edema, congestive heart failure, no pleural fluid.   HOSPITAL COURSE:  1. Respiratory failure. The patient was admitted with an original     presumptive diagnosis of severe community acquired pneumonia and was     originally started on Rocephin 1 g q.24h. and Avelox 400 mg IV q.24h.     Multiple sputum cultures were done for ASB, fungal, and routine culture.     These were all normal, and pneumocystis carinii smear done on August 28     was positive. The patient's breathing status was also treated with     albuterol nebulizers. The PCP was further addressed  with Solu-Medrol.     Worsened acutely and he required intubation on August 28. The patient was     successfully extubated on July 24, 2003.  2. Sepsis secondary to PCP. The patient again was treated with TNP and     steroids for his PCP. Xigris was also required and started on July 15, 2003. Fluids were managed per the __________ protocol until September 5     at which time he was diuresed with Lasix and successfully extubated.     Repeat respiratory cultures were done which were negative. All other     cultures, blood cultures, urine cultures were negative.  3. Human immunodeficiency virus. Dr. Quentin Cornwall and Dr. [name] of infectious     disease were consulted to manage the patient's HIV status. Initial CD4     count was 120 on August 30, viral load was 267,000 on August 30. RPR was     negative, and hepatitis B and C were likewise negative. GC and chlamydia     probes were negative.  4. Normocytic anemia. It was believed this was secondary to sepsis. In fact,     after being extubated and removed from the ICU, hemoglobin stabilized and     improved slightly. At the time of discharge, hemoglobin 11.2 with a MCV     of 85.6. This needs to be followed up closely as an outpatient.   DISPOSITION:  The patient was discharged to home with family in good  condition. He is to followup in the internal medicine outpatient clinic and  infectious disease clinic.   ISSUES AT FOLLOWUP:  Anemia. HIV treatment with a CD4 count less than 200.      [name] Chris, MD                        Thomes Lolling, M.D.    KK/MEDQ  D:  10/08/2003  T:  10/09/2003  Job:  [id number]

## 2011-04-04 NOTE — Consult Note (Signed)
NAMEPEPPER, ERTMAN                           ACCOUNT NO.:  [id number]   MEDICAL RECORD NO.:  [id number]                   PATIENT TYPE:  INP   LOCATION:  2108                                 FACILITY:  [facility]   PHYSICIAN:  Clinton D. Annamaria Boots, M.D.              DATE OF BIRTH:  07/31/1961   DATE OF CONSULTATION:  07/13/2003  DATE OF DISCHARGE:                                   CONSULTATION   PROBLEM FOR CONSULTATION:  A 50 year old man seen at the request of Dr.  Alyson Ingles for a problem with hypoxia and diffuse pulmonary infiltrates.   HISTORY:  He says he was well and in his usual normal state of health until  about 10 days PTA when he noted dyspnea climbing down from his truck.  He  has been persistently dyspneic, especially over the last seven to 10 days,  feeling weak and short of breath enough that he has been in bed most of the  last few days.  Cough has been productive of clear mucus.  He has had  anorexia with some nausea and a little vomiting today only.  Dr. Alyson Ingles  gave him Zithromax three or four days ago, which he took until today, but he  presented to the emergency room today complaining of shortness of breath and  reporting fever with a hard chill today.   Labs from August 23 include a WBC of 8500 with a hemoglobin of 13.4, BUN of  12, creatinine 0.9, glucose of 88, normal liver function tests.  In the  emergency room this evening Rocephin was started, apparently before cultures  obtained.  Radiology:  Chest x-ray and CT scan are showing diffuse pneumonia  and air space disease bilaterally with coarse reticular markings and  confluent infiltrates in both bases, no distinct effusion, no pulmonary  embolism.   REVIEW OF SYSTEMS:  A frontal headache.  No confusion or syncope.  Low back  pain today.  Dyspnea.  No chest pain.  Nausea with a little vomiting today.  Anorexia with weight loss over the last few days only.  No constipation,  diarrhea, melena, reflux.  No  dysuria.  He denies ankle edema or leg pain,  joint pain, or rash.  No bleeding.  No adenopathy.   FAMILY HISTORY:  Father died in [place].  Mother alive with heart disease.  Sister has had CABG.   SOCIAL HISTORY:  Lives with roommate.  Works for post [employer].  No HIV risk.  He recognized no street drugs, tobacco, or alcohol.  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   PAST MEDICAL HISTORY:  No prior hospitalizations.  No surgery.  No regular  medication.  He denies any history of heart, lung, liver, or kidney disease,  diabetes, or TB exposure.   PHYSICAL EXAMINATION:  GENERAL:  Tired, ill-appearing, weak, fully-oriented  and cooperative, well-developed, well-nourished African-American man.  VITAL SIGNS:  Temperature 99.4, BP 142/77, pulse 106 and regular,  respirations 24, shallow and rapid.  Saturation was recorded as 92% on 4 L  prongs, 98% on 15 L prongs.  I do not find a room air saturation recorded.  SKIN:  No rash or bruising.  ADENOPATHY:  None found.  HEENT:  Conjunctivae are sallow but not icteric.  Pupils are reactive.  Speech is clear.  Oral mucosa looks clear.  NECK:  Supple.  No neck vein distention, stridor, or thyromegaly.  CHEST:  Loose upper airway rattle.  Rhonchi in the deep posterior bases,  shallow tachypnea on his nonrebreathing mask, speaking in very short  sentences.  No rub, wheeze, or rales heard at this time.  I sat him upright  with assistance, and his oxygen saturation fell to 86% on the nonrebreathing  mask, needing 90 seconds to recover back to 92% once supine.  CARDIAC:  Regular rhythm with no rub or murmur heard.  ABDOMEN:  Active bowel sounds, nontender, no guarding, no  hepatosplenomegaly.  GENITOURINARY, RECTAL:  Not done at this time.  EXTREMITIES:  No cyanosis, clubbing, or edema.   IMPRESSION:  Pending lab confirmation, this looks like an aggressive  community-acquired  pneumonia.  My first thoughts would be Pneumococcus or  Haemophilus influenzae.  There is acute hypoxic respiratory failure marking  potential for rapid worsening.  He is currently hemodynamically stable,  mildly thirsty.  I see a potential for further hypoxemia requiring  intubation and ventilator support.  I have recommended intensive care unit  bed.  Currently there are no intensive care unit beds and it is not clear  that transfer to a telemetry bed is an advantage at this time, noting that  his admission EKG is normal and pulse and cardiac exam currently are normal.   RECOMMENDATIONS:  1. ICU bed when available.  2. Close monitoring.  3. We will culture even though it may be a little late with Rocephin already     added.  4. He denies HIV risk, but I think it is valid to explore that, as he has     agreed to serology.  5. Since he has been on Zithromax and has started Rocephin, I will broaden     the Rocephin coverage with a quinolone pending further guidance.  6. We will recheck ABG and chest x-ray.                                               Clinton D. Annamaria Boots, M.D.    CDY/MEDQ  D:  07/13/2003  T:  07/14/2003  Job:  [id number]   cc:   Rushie Nyhan, M.D.  [phone]  [place] [address]  Fax: [phone]

## 2011-04-04 NOTE — Op Note (Signed)
   [name], [name]                           ACCOUNT NO.:  [id number]   MEDICAL RECORD NO.:  [id number]                   PATIENT TYPE:  INP   LOCATION:  2108                                 FACILITY:  [facility]   PHYSICIAN:  Finis Bud, M.D.              DATE OF BIRTH:  07/31/1961   DATE OF PROCEDURE:  07/15/2003  DATE OF DISCHARGE:                                 OPERATIVE REPORT   PROCEDURE:  Intubation.   The anesthesia team was called in to intubate this 50 year old male in  respiratory distress by Dr. Joya Gaskins.  The case was briefly discussed with Dr.  Joya Gaskins.   Upon arrival to the room systolic blood pressure was 130s, heart rate 120s  and patient not responding.  O2 saturations decreased to the 50s,  reportedly.  Sedation given by Dr. Joya Gaskins.   Intubation #7.5 endotracheal tube.  Positive bilateral breath sounds,  positive CO2 by EZ cap.  Tube secured by respiratory therapy.   PLAN:  Respiratory care by Dr. Joya Gaskins.  Chest x-ray pending.                                               Finis Bud, M.D.    CE/MEDQ  D:  07/15/2003  T:  07/16/2003  Job:  [id number]

## 2011-08-08 LAB — T-HELPER CELL (CD4) - (RCID CLINIC ONLY): CD4 % Helper T Cell: 11 — ABNORMAL LOW

## 2011-08-14 LAB — CBC
RBC: 4.47
WBC: 4.1

## 2011-08-14 LAB — BASIC METABOLIC PANEL
Calcium: 8.8
Creatinine, Ser: 0.64
GFR calc Af Amer: >60
Sodium: 144

## 2011-08-14 LAB — APTT: aPTT: 41 — ABNORMAL HIGH

## 2011-08-14 LAB — PROTIME-INR
INR: 1
Prothrombin Time: 13.7

## 2011-08-18 LAB — T-HELPER CELL (CD4) - (RCID CLINIC ONLY): CD4 % Helper T Cell: 9 — ABNORMAL LOW

## 2011-08-27 LAB — T-HELPER CELL (CD4) - (RCID CLINIC ONLY): CD4 T Cell Abs: 100 — ABNORMAL LOW

## 2012-03-18 ENCOUNTER — Telehealth: Payer: Self-pay | Admitting: *Deleted

## 2012-03-18 NOTE — Telephone Encounter (Signed)
States he has been out of his HIV meds for over 4 months & needs a refill. I told him we cannot do that since he has not been seen in over a year. I asked him who has been seeing him in [place]. He states no one, He does not want to see another md.  [name] is his doctor. I told him we can make an appt for labs & to see the doctor but it may be 2-3 months from now.  (only wants [name]) I suggested he call 411 to find an ID provider. He states he needs fluconazole now. I asked that he find a local UC to get this taken care of since he needs it asap. He agreed with this.  I transferred him back to the front to make the appts. He will be here to see md on 05/26/12 with labs 2 weeks prior.

## 2012-05-11 ENCOUNTER — Telehealth: Payer: Self-pay

## 2012-05-11 ENCOUNTER — Other Ambulatory Visit: Payer: Self-pay | Admitting: Infectious Diseases

## 2012-05-11 DIAGNOSIS — B029 Zoster without complications: Secondary | ICD-10-CM | POA: Insufficient documentation

## 2012-05-11 DIAGNOSIS — Z720 Tobacco use: Secondary | ICD-10-CM | POA: Insufficient documentation

## 2012-05-11 NOTE — Telephone Encounter (Signed)
Called patient about what to bring for RW financial docs - patient stated he had Medicare card - parts A & B - he does not need RW, but will need ADAP and will have to get Part D if he does not already have - could do ADAP on his Dr appt, as next recertification is in July.

## 2012-05-11 NOTE — Progress Notes (Signed)
Pt needs to see Financial Coordinator to assess for insurance coverage.

## 2012-05-12 ENCOUNTER — Other Ambulatory Visit: Payer: Medicare Other

## 2012-05-12 ENCOUNTER — Other Ambulatory Visit (HOSPITAL_COMMUNITY)
Admission: RE | Admit: 2012-05-12 | Discharge: 2012-05-12 | Disposition: A | Discharge status: Home / Self Care | Payer: Medicare Other | Source: Ambulatory Visit | Attending: Infectious Diseases | Admitting: Infectious Diseases

## 2012-05-12 ENCOUNTER — Other Ambulatory Visit: Payer: Self-pay | Admitting: Infectious Diseases

## 2012-05-12 ENCOUNTER — Other Ambulatory Visit: Payer: Self-pay | Admitting: Licensed Clinical Social Worker

## 2012-05-12 DIAGNOSIS — Z79899 Other long term (current) drug therapy: Secondary | ICD-10-CM | POA: Diagnosis not present

## 2012-05-12 DIAGNOSIS — Z113 Encounter for screening for infections with a predominantly sexual mode of transmission: Secondary | ICD-10-CM | POA: Diagnosis not present

## 2012-05-12 DIAGNOSIS — Z7901 Long term (current) use of anticoagulants: Secondary | ICD-10-CM

## 2012-05-12 DIAGNOSIS — B20 Human immunodeficiency virus [HIV] disease: Secondary | ICD-10-CM | POA: Diagnosis not present

## 2012-05-12 LAB — CBC WITH DIFFERENTIAL/PLATELET
Hemoglobin: 14.7 g/dL (ref 13.0–17.0)
Lymphs Abs: 1.6 10*3/uL (ref 0.7–4.0)
Monocytes Relative: 10 % (ref 3–12)
Neutro Abs: 2.6 10*3/uL (ref 1.7–7.7)
Neutrophils Relative %: 54 % (ref 43–77)
RBC: 5.11 MIL/uL (ref 4.22–5.81)

## 2012-05-12 LAB — COMPREHENSIVE METABOLIC PANEL
Albumin: 4.2 g/dL (ref 3.5–5.2)
CO2: 23 mEq/L (ref 19–32)
Chloride: 110 mEq/L (ref 96–112)
Glucose, Bld: 117 mg/dL — ABNORMAL HIGH (ref 70–99)
Potassium: 3.8 mEq/L (ref 3.5–5.3)
Sodium: 143 mEq/L (ref 135–145)
Total Protein: 7.1 g/dL (ref 6.0–8.3)

## 2012-05-12 LAB — LIPID PANEL
Cholesterol: 151 mg/dL (ref 0–200)
LDL Cholesterol: 96 mg/dL (ref 0–99)
Triglycerides: 72 mg/dL (ref <150)

## 2012-05-12 LAB — PROTIME-INR: INR: 1 (ref <1.50)

## 2012-05-13 LAB — HIV-1 RNA ULTRAQUANT REFLEX TO GENTYP+
HIV 1 RNA Quant: 22037 copies/mL — ABNORMAL HIGH (ref <20)
HIV-1 RNA Quant, Log: 4.34 {Log} — ABNORMAL HIGH (ref <1.30)

## 2012-05-13 LAB — T-HELPER CELL (CD4) - (RCID CLINIC ONLY): CD4 T Cell Abs: 190 uL — ABNORMAL LOW (ref 400–2700)

## 2012-05-21 LAB — HIV-1 GENOTYPR PLUS

## 2012-05-26 ENCOUNTER — Ambulatory Visit (INDEPENDENT_AMBULATORY_CARE_PROVIDER_SITE_OTHER): Payer: Medicare Other | Admitting: Infectious Diseases

## 2012-05-26 ENCOUNTER — Other Ambulatory Visit: Payer: Self-pay | Admitting: *Deleted

## 2012-05-26 ENCOUNTER — Encounter: Payer: Self-pay | Admitting: Infectious Diseases

## 2012-05-26 VITALS — BP 159/83 | HR 66 | Temp 97.5°F | Ht 65.0 in | Wt 167.0 lb

## 2012-05-26 DIAGNOSIS — R079 Chest pain, unspecified: Secondary | ICD-10-CM | POA: Diagnosis not present

## 2012-05-26 DIAGNOSIS — K5900 Constipation, unspecified: Secondary | ICD-10-CM

## 2012-05-26 DIAGNOSIS — K130 Diseases of lips: Secondary | ICD-10-CM | POA: Diagnosis not present

## 2012-05-26 DIAGNOSIS — H539 Unspecified visual disturbance: Secondary | ICD-10-CM | POA: Diagnosis not present

## 2012-05-26 DIAGNOSIS — M25559 Pain in unspecified hip: Secondary | ICD-10-CM | POA: Diagnosis not present

## 2012-05-26 DIAGNOSIS — C8589 Other specified types of non-Hodgkin lymphoma, extranodal and solid organ sites: Secondary | ICD-10-CM

## 2012-05-26 DIAGNOSIS — B20 Human immunodeficiency virus [HIV] disease: Secondary | ICD-10-CM | POA: Diagnosis not present

## 2012-05-26 DIAGNOSIS — B59 Pneumocystosis: Secondary | ICD-10-CM

## 2012-05-26 MED ORDER — RALTEGRAVIR POTASSIUM 400 MG PO TABS: 400.0000 mg | ORAL_TABLET | Freq: Two times a day (BID) | ORAL | Status: DC

## 2012-05-26 MED ORDER — SULFAMETHOXAZOLE-TRIMETHOPRIM 800-160 MG PO TABS
1.0000 | ORAL_TABLET | ORAL | Status: AC
Start: 2012-05-26 — End: 2012-06-05

## 2012-05-26 MED ORDER — SENNOSIDES-DOCUSATE SODIUM 8.6-50 MG PO TABS
1.0000 | ORAL_TABLET | Freq: Every day | ORAL | Status: DC
Start: 2012-05-26 — End: 2018-05-26

## 2012-05-26 MED ORDER — FLUCONAZOLE 100 MG PO TABS: 100.0000 mg | ORAL_TABLET | Freq: Every day | ORAL | Status: AC

## 2012-05-26 MED ORDER — EMTRICITABINE-TENOFOVIR DF 200-300 MG PO TABS: 1.0000 | ORAL_TABLET | Freq: Every day | ORAL | Status: DC

## 2012-05-26 MED ORDER — RITONAVIR 100 MG PO TABS: 100.0000 mg | ORAL_TABLET | Freq: Every day | ORAL | Status: DC

## 2012-05-26 MED ORDER — ATAZANAVIR SULFATE 300 MG PO CAPS: 300.0000 mg | ORAL_CAPSULE | Freq: Every day | ORAL | Status: DC

## 2012-05-26 NOTE — Assessment & Plan Note (Signed)
Have him seen in heme/onc

## 2012-05-26 NOTE — Assessment & Plan Note (Signed)
He is off his meds and now qualifies as "AIDS". Will start him back on his meds (states he was taking his ISN prn for cough). i instructed him to take ALL his medications. Will have him meet with Barb. rtc 4 months

## 2012-05-26 NOTE — Progress Notes (Signed)
  Subjective:    Patient ID: DELOSS RATHEL, male    DOB: [birth year], 51 y.o.   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 has completed 6 weeks of xrt.   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 States he is CP free, still has phone # of [facility] and states he will call them.  Was preve given diflucan for angular chelitis. Would like refill.  Has lost 2#, has been eating well when he "smokes".  Has been doing well with respect to his jaw- is unaware of any f/u. Has spot on his cheek that won't heal.  Has been off all meds for 8 months.  Having hip pain for first 3 hours of the day. Has ortho f/u today.  HIV 1 RNA Quant (copies/mL)  Date Value  05/12/2012 22037*  11/19/2010 77*  04/29/2010 <48 copies/mL      CD4 T Cell Abs (cmm)  Date Value  05/12/2012 190*  11/19/2010 280*  04/29/2010 270*      Review of Systems  Constitutional: Negative for fever and appetite change.  Respiratory: Positive for shortness of breath. Negative for apnea.        Had 1 episode of SOB when sitting on couch 2 weeks ago. Resolved without intervention- took a deep breath. Lasted 5-6 seconds.   Cardiovascular: Negative for chest pain.  Gastrointestinal: Positive for constipation. Negative for diarrhea.  Genitourinary: Negative for dysuria.       Objective:   Physical Exam  Constitutional: He appears well-developed and well-nourished.  HENT:  Right Ear: No drainage, swelling or tenderness. No foreign bodies. No middle ear effusion.  Left Ear: No drainage, swelling or tenderness. No foreign bodies.  No middle ear effusion.  Mouth/Throat: Oropharynx is clear and moist and mucous membranes are normal. He has dentures. No oropharyngeal exudate.  Eyes: EOM are normal. Pupils are equal, round, and reactive to light.  Neck: Neck supple.  Cardiovascular: Normal rate,  regular rhythm and normal heart sounds.   Pulmonary/Chest: Effort normal and breath sounds normal.  Abdominal: Soft. Bowel sounds are normal. He exhibits no distension. There is no tenderness.  Lymphadenopathy:    He has no cervical adenopathy.          Assessment & Plan:

## 2012-05-26 NOTE — Assessment & Plan Note (Signed)
Will refill his diflucan.

## 2012-05-26 NOTE — Assessment & Plan Note (Signed)
Will get pt back in with [facility] if he has repeat episodes. He has their phone # to call in intervening period.

## 2012-05-26 NOTE — Assessment & Plan Note (Signed)
Will restart his prophylaxis

## 2012-05-26 NOTE — Assessment & Plan Note (Signed)
Have him seen by ophtho. Has been using his friends glasses. Has been gradual.

## 2012-05-31 ENCOUNTER — Telehealth: Payer: Self-pay

## 2012-05-31 NOTE — Telephone Encounter (Signed)
Called patient to remind him to fax or mail his SSI letters for 2012 and 2013 - he also needs to apply for Part D Medicare to get on ADAP program - I had given him the [facility] office number when he was here -  he said he would call me back.

## 2012-06-01 ENCOUNTER — Encounter: Payer: Self-pay | Admitting: *Deleted

## 2012-06-01 NOTE — Progress Notes (Signed)
Patient ID: [name], male   DOB: [birth year], 51 y.o.   MRN: [id number]  Pt has referral for ophthalmologist for vision changes. Pt has an appointment with Dr. Claudean Kinds at [facility] [phone]) on Monday, June 07, 2012 @ 10:15am. Have left a message with patient and sent letter as reminder. Eliezer Champagne RN

## 2012-06-10 ENCOUNTER — Telehealth: Payer: Self-pay

## 2012-06-10 NOTE — Telephone Encounter (Signed)
Called patient and left message regarding SSI docs needed for ADAP application.

## 2012-09-08 ENCOUNTER — Telehealth: Payer: Self-pay | Admitting: *Deleted

## 2012-09-08 NOTE — Telephone Encounter (Signed)
Patient has moved to the [place] area and advised that making 2 visits creates a hardship so he request to have his labs and doctor visit in the same day. He canceled his visit for labs on 09/13/12 and advised he will do it all the 09/29/12 visit.

## 2012-09-13 ENCOUNTER — Other Ambulatory Visit: Payer: Medicare Other

## 2012-09-24 ENCOUNTER — Telehealth: Payer: Self-pay | Admitting: *Deleted

## 2012-09-24 NOTE — Telephone Encounter (Signed)
Patient requesting a refill on diflucan for his angular chelitis, given #10 at last visit in July and is no longer on med list.  He has moved to [place] and has upcoming appt 09/29/12. Ok to fill? Myrtis Hopping CMA

## 2012-09-27 ENCOUNTER — Ambulatory Visit: Payer: Medicare Other | Admitting: Infectious Diseases

## 2012-09-29 ENCOUNTER — Telehealth: Payer: Self-pay | Admitting: *Deleted

## 2012-09-29 ENCOUNTER — Ambulatory Visit: Payer: Medicare Other | Admitting: Infectious Diseases

## 2012-09-29 ENCOUNTER — Ambulatory Visit: Payer: Medicare Other

## 2012-09-29 NOTE — Telephone Encounter (Signed)
Called the patient and asked how he was doing with his meds and if he had any questions or concerns and he advised a few. Offered him the services of [facility] and he advised he lives in [place] and can not get her but that if Lonna Duval is here when he comes he would not mind speaking with him. Will advise Minh of this.

## 2012-12-06 ENCOUNTER — Other Ambulatory Visit: Payer: Self-pay | Admitting: *Deleted

## 2012-12-06 ENCOUNTER — Telehealth: Payer: Self-pay | Admitting: *Deleted

## 2012-12-06 ENCOUNTER — Other Ambulatory Visit: Payer: Self-pay | Admitting: Internal Medicine

## 2012-12-06 DIAGNOSIS — B370 Candidal stomatitis: Secondary | ICD-10-CM

## 2012-12-06 DIAGNOSIS — B20 Human immunodeficiency virus [HIV] disease: Secondary | ICD-10-CM

## 2012-12-06 MED ORDER — RALTEGRAVIR POTASSIUM 400 MG PO TABS: 400.0000 mg | ORAL_TABLET | Freq: Two times a day (BID) | ORAL | Status: DC

## 2012-12-06 MED ORDER — RITONAVIR 100 MG PO TABS: 100.0000 mg | ORAL_TABLET | Freq: Every day | ORAL | Status: DC

## 2012-12-06 MED ORDER — ATAZANAVIR SULFATE 300 MG PO CAPS: 300.0000 mg | ORAL_CAPSULE | Freq: Every day | ORAL | Status: DC

## 2012-12-06 MED ORDER — FLUCONAZOLE 100 MG PO TABS: ORAL_TABLET | ORAL | Status: DC

## 2012-12-06 MED ORDER — EMTRICITABINE-TENOFOVIR DF 200-300 MG PO TABS: 1.0000 | ORAL_TABLET | Freq: Every day | ORAL | Status: DC

## 2012-12-06 NOTE — Telephone Encounter (Signed)
Patient called c/o thrush, has been off HIV meds for 4 months, next appt 12/27/12. He does labs same day as he lives in [place]. Myrtis Hopping CMA

## 2012-12-06 NOTE — Telephone Encounter (Signed)
Patient called c/o thrush, he has been off his hiv meds for 4 months due to not being able to pay his Medicare copay. He said he took it every day until he ran out. He is now able to pay the copay, but he has developed thrush and is requesting an Rx. He has an appt to see Dr. [name] 12/27/12. Please advise Myrtis Hopping CMA

## 2012-12-06 NOTE — Telephone Encounter (Signed)
I will give him fluconazole 100 mg daily for 7 days and then switched to 100 mg weekly.

## 2012-12-08 ENCOUNTER — Ambulatory Visit: Payer: Medicare Other

## 2012-12-08 ENCOUNTER — Ambulatory Visit: Payer: Medicare Other | Admitting: Infectious Diseases

## 2012-12-08 NOTE — Telephone Encounter (Signed)
Please send him a rx for difulcan, thanks

## 2012-12-27 ENCOUNTER — Ambulatory Visit: Payer: Medicare Other

## 2012-12-27 ENCOUNTER — Encounter: Payer: Self-pay | Admitting: *Deleted

## 2012-12-27 ENCOUNTER — Ambulatory Visit: Payer: Medicare Other | Admitting: Infectious Diseases

## 2012-12-27 ENCOUNTER — Telehealth: Payer: Self-pay | Admitting: *Deleted

## 2012-12-27 NOTE — Telephone Encounter (Signed)
Called pt's home number to notify of missed appointment; there was no answer.  Called his emergency contact; phone does not accept incoming calls. Letter sent. Referral sent to [facility]. Landis Gandy, RN

## 2013-05-30 ENCOUNTER — Telehealth: Payer: Self-pay | Admitting: *Deleted

## 2013-05-30 NOTE — Telephone Encounter (Signed)
Called patient because he was listed as being relocated and he said that currently he is out of care, but has moved to [place] and can not afford to come here. He was driving and will call back. He should be advised to go to the health department in [place] and let them know he needs to establish care and they can refer him to an ID clinic. Myrtis Hopping

## 2013-07-04 ENCOUNTER — Telehealth: Payer: Self-pay | Admitting: *Deleted

## 2013-07-04 ENCOUNTER — Ambulatory Visit: Payer: Medicare Other | Admitting: Infectious Diseases

## 2013-07-04 NOTE — Telephone Encounter (Signed)
Called patient due to today's no show and got his voicemail, left message. Per Dr. [name] he needs to schedule and keep a f/u in the very near future or we will not be able to refill his meds. He had already said he was transferring his care to [facility] and apparently he has not done this, has not been seen in over a year. Myrtis Hopping

## 2013-08-19 ENCOUNTER — Encounter: Payer: Self-pay | Admitting: Infectious Diseases

## 2013-08-19 ENCOUNTER — Ambulatory Visit (INDEPENDENT_AMBULATORY_CARE_PROVIDER_SITE_OTHER): Payer: Medicare Other | Admitting: Infectious Diseases

## 2013-08-19 VITALS — BP 174/98 | HR 66 | Temp 98.1°F | Ht 65.0 in | Wt 160.0 lb

## 2013-08-19 DIAGNOSIS — R0602 Shortness of breath: Secondary | ICD-10-CM | POA: Diagnosis not present

## 2013-08-19 DIAGNOSIS — C8589 Other specified types of non-Hodgkin lymphoma, extranodal and solid organ sites: Secondary | ICD-10-CM

## 2013-08-19 DIAGNOSIS — B370 Candidal stomatitis: Secondary | ICD-10-CM | POA: Diagnosis not present

## 2013-08-19 DIAGNOSIS — B20 Human immunodeficiency virus [HIV] disease: Secondary | ICD-10-CM

## 2013-08-19 DIAGNOSIS — Z79899 Other long term (current) drug therapy: Secondary | ICD-10-CM | POA: Diagnosis not present

## 2013-08-19 DIAGNOSIS — I10 Essential (primary) hypertension: Secondary | ICD-10-CM | POA: Diagnosis not present

## 2013-08-19 DIAGNOSIS — Z23 Encounter for immunization: Secondary | ICD-10-CM

## 2013-08-19 DIAGNOSIS — Z113 Encounter for screening for infections with a predominantly sexual mode of transmission: Secondary | ICD-10-CM

## 2013-08-19 LAB — COMPREHENSIVE METABOLIC PANEL
Alkaline Phosphatase: 60 U/L (ref 39–117)
BUN: 8 mg/dL (ref 6–23)
Creat: 0.77 mg/dL (ref 0.50–1.35)
Glucose, Bld: 106 mg/dL — ABNORMAL HIGH (ref 70–99)
Sodium: 141 mEq/L (ref 135–145)
Total Bilirubin: 0.5 mg/dL (ref 0.3–1.2)

## 2013-08-19 LAB — CBC WITH DIFFERENTIAL/PLATELET
Basophils Relative: 1 % (ref 0–1)
Eosinophils Absolute: 0.1 10*3/uL (ref 0.0–0.7)
Eosinophils Relative: 3 % (ref 0–5)
Hemoglobin: 14.1 g/dL (ref 13.0–17.0)
MCH: 28.4 pg (ref 26.0–34.0)
MCHC: 33.8 g/dL (ref 30.0–36.0)
Monocytes Absolute: 0.5 10*3/uL (ref 0.1–1.0)
Monocytes Relative: 12 % (ref 3–12)
Neutrophils Relative %: 55 % (ref 43–77)

## 2013-08-19 LAB — LIPID PANEL
Cholesterol: 156 mg/dL (ref 0–200)
LDL Cholesterol: 99 mg/dL (ref 0–99)
Triglycerides: 52 mg/dL (ref <150)
VLDL: 10 mg/dL (ref 0–40)

## 2013-08-19 LAB — RPR

## 2013-08-19 LAB — T-HELPER CELL (CD4) - (RCID CLINIC ONLY)
CD4 % Helper T Cell: 10 % — ABNORMAL LOW (ref 33–55)
CD4 T Cell Abs: 110 /uL — ABNORMAL LOW (ref 400–2700)

## 2013-08-19 MED ORDER — FLUCONAZOLE 100 MG PO TABS: ORAL_TABLET | ORAL | Status: DC

## 2013-08-19 MED ORDER — ELVITEG-COBIC-EMTRICIT-TENOFDF 150-150-200-300 MG PO TABS: 1.0000 | ORAL_TABLET | Freq: Every day | ORAL | Status: DC

## 2013-08-19 MED ORDER — METOPROLOL TARTRATE 25 MG PO TABS: 25.0000 mg | ORAL_TABLET | Freq: Two times a day (BID) | ORAL | Status: DC

## 2013-08-19 NOTE — Assessment & Plan Note (Signed)
Not clear if this panic, related to his BP. Will try to get his BP under control and see if this improves.

## 2013-08-19 NOTE — Assessment & Plan Note (Signed)
Will get him into oncology

## 2013-08-19 NOTE — Assessment & Plan Note (Addendum)
Will restart his b-blocker, see him back in 2 weeks.

## 2013-08-19 NOTE — Assessment & Plan Note (Signed)
Will refill his diflucan.

## 2013-08-19 NOTE — Progress Notes (Signed)
  Subjective:    Patient ID: [name], male    DOB: 1961/07/01, 52 y.o.   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 has completed 6 weeks of xrt.  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 States he is CP free, still has phone # of [facility] and states he will call them.  Not been seen for 2 weeks. Has been having cough with prod sptum fo r1 month. Gets hot but no chills.  Has had SOB with excitement or getting scared.  States his BP is high today because of hurrying to get here.  State he needs more fluconazole, having thrush.  Has not been taking ART. Makes him feel poorly. Loosing wt. Has not seen oncologist in some time, has moved to [place].   HIV 1 RNA Quant (copies/mL)  Date Value  05/12/2012 22037*  11/19/2010 77*  04/29/2010 <48 copies/mL      CD4 T Cell Abs (cmm)  Date Value  05/12/2012 190*  11/19/2010 280*  04/29/2010 270*    Review of Systems     Objective:   Physical Exam  Constitutional: He appears well-developed and well-nourished.  HENT:  Mouth/Throat: Oropharyngeal exudate present.  Eyes: EOM are normal. Pupils are equal, round, and reactive to light.  Neck: Neck supple.  Cardiovascular: Normal rate, regular rhythm and normal heart sounds.   Pulmonary/Chest: Effort normal and breath sounds normal.  Abdominal: Soft. Bowel sounds are normal. There is no tenderness.  Lymphadenopathy:    He has no cervical adenopathy.          Assessment & Plan:

## 2013-08-19 NOTE — Assessment & Plan Note (Signed)
Will try to start him on stribilid. I am not sure he will be adherent. Will check his labs today. Try to have him back in 2 months.

## 2013-08-20 LAB — HEPATITIS B SURFACE ANTIBODY,QUALITATIVE: Hep B S Ab: NEGATIVE

## 2013-08-22 LAB — HIV-1 RNA ULTRAQUANT REFLEX TO GENTYP+: HIV-1 RNA Quant, Log: 3.98 {Log} — ABNORMAL HIGH (ref <1.30)

## 2013-08-25 LAB — HIV-1 GENOTYPR PLUS

## 2013-09-01 ENCOUNTER — Ambulatory Visit: Payer: Medicare Other | Admitting: Infectious Diseases

## 2013-09-14 ENCOUNTER — Ambulatory Visit (INDEPENDENT_AMBULATORY_CARE_PROVIDER_SITE_OTHER): Payer: Medicare Other | Admitting: Infectious Diseases

## 2013-09-14 ENCOUNTER — Encounter: Payer: Self-pay | Admitting: Infectious Diseases

## 2013-09-14 VITALS — BP 139/83 | HR 52 | Temp 98.2°F | Ht 66.0 in | Wt 165.0 lb

## 2013-09-14 DIAGNOSIS — I10 Essential (primary) hypertension: Secondary | ICD-10-CM

## 2013-09-14 DIAGNOSIS — B20 Human immunodeficiency virus [HIV] disease: Secondary | ICD-10-CM | POA: Diagnosis not present

## 2013-09-14 DIAGNOSIS — K130 Diseases of lips: Secondary | ICD-10-CM | POA: Diagnosis not present

## 2013-09-14 MED ORDER — BENAZEPRIL HCL 5 MG PO TABS: 5.0000 mg | ORAL_TABLET | Freq: Every day | ORAL | Status: DC

## 2013-09-14 MED ORDER — MAGIC MOUTHWASH: 5.0000 mL | Freq: Three times a day (TID) | ORAL | Status: DC | PRN

## 2013-09-14 NOTE — Assessment & Plan Note (Signed)
Will refill his magic mouthwash.

## 2013-09-14 NOTE — Assessment & Plan Note (Signed)
He appears to be doing better with his ART. Will recheck his labs at his f/u. He has condoms at home.

## 2013-09-14 NOTE — Progress Notes (Signed)
  Subjective:    Patient ID: [name], male    DOB: May 25, 1961, 52 y.o.   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 has completed 6 weeks of xrt.  Was started pt on stribilid at previous visit. Feels much better since starting this. Not sluggish or weak.  Has not been able to have erections since started on lopressor.  Lost senokot rx for constipation.  Has been taking flucon for angular chelitis. Would like refill of "magic mouthwash".   HIV 1 RNA Quant (copies/mL)  Date Value  08/19/2013 9447*  05/12/2012 22037*  11/19/2010 77*     CD4 T Cell Abs (/uL)  Date Value  08/19/2013 110*  05/12/2012 190*  11/19/2010 280*     Review of Systems  Gastrointestinal: Positive for constipation.  Genitourinary:       Erectile dysfunction.   Musculoskeletal: Positive for back pain.       Objective:   Physical Exam  Constitutional: He appears well-developed and well-nourished.  HENT:  Mouth/Throat: No oropharyngeal exudate.  Eyes: EOM are normal. Pupils are equal, round, and reactive to light.  Neck: Neck supple.  Cardiovascular: Normal rate, regular rhythm and normal heart sounds.   Pulmonary/Chest: Effort normal and breath sounds normal.  Abdominal: Soft. Bowel sounds are normal. There is no tenderness.  Lymphadenopathy:    He has no cervical adenopathy.          Assessment & Plan:

## 2013-09-14 NOTE — Assessment & Plan Note (Signed)
Will change his beta blocker to ACE-I

## 2013-10-07 ENCOUNTER — Other Ambulatory Visit: Payer: Self-pay | Admitting: *Deleted

## 2013-10-07 ENCOUNTER — Other Ambulatory Visit: Payer: Self-pay | Admitting: Infectious Diseases

## 2013-10-07 DIAGNOSIS — B20 Human immunodeficiency virus [HIV] disease: Secondary | ICD-10-CM

## 2013-10-07 MED ORDER — ELVITEG-COBIC-EMTRICIT-TENOFDF 150-150-200-300 MG PO TABS: 1.0000 | ORAL_TABLET | Freq: Every day | ORAL | Status: DC

## 2013-10-11 ENCOUNTER — Ambulatory Visit: Payer: Medicare Other | Admitting: Infectious Diseases

## 2013-11-22 ENCOUNTER — Ambulatory Visit: Payer: Medicare Other | Admitting: Infectious Diseases

## 2013-12-21 ENCOUNTER — Telehealth: Payer: Self-pay | Admitting: Infectious Diseases

## 2013-12-21 NOTE — Telephone Encounter (Signed)
Called pt to get him to come in for clinic.  Left # for clinic.

## 2014-01-04 ENCOUNTER — Telehealth: Payer: Self-pay | Admitting: Infectious Diseases

## 2014-01-04 NOTE — Telephone Encounter (Signed)
January 30, 2014.   9am appt.

## 2014-01-30 ENCOUNTER — Ambulatory Visit: Payer: Medicare Other | Admitting: Infectious Diseases

## 2014-03-22 ENCOUNTER — Telehealth: Payer: Self-pay | Admitting: *Deleted

## 2014-03-22 NOTE — Telephone Encounter (Signed)
Question about Norvir refill from 03/21/14.  Pt given times and days for "Walk-in" to [facility].  Advised pt to come to [facility] ASAP for follow-up per Dr. [name].

## 2014-04-26 ENCOUNTER — Telehealth: Payer: Self-pay | Admitting: *Deleted

## 2014-04-26 NOTE — Telephone Encounter (Signed)
Called and left patient voice mail to come to our walk in clinic. Detectable, last office visit 09/14/13. [name]

## 2014-05-10 ENCOUNTER — Telehealth: Payer: Self-pay | Admitting: *Deleted

## 2014-05-10 NOTE — Telephone Encounter (Signed)
Pt on detectable list, should be seen.  RN left message with walk-in clinic information, advising him to call ASAP. Landis Gandy, RN

## 2014-08-29 ENCOUNTER — Other Ambulatory Visit: Payer: Self-pay | Admitting: Licensed Clinical Social Worker

## 2014-08-29 ENCOUNTER — Telehealth: Payer: Self-pay | Admitting: *Deleted

## 2014-08-29 DIAGNOSIS — B370 Candidal stomatitis: Secondary | ICD-10-CM

## 2014-08-29 MED ORDER — FLUCONAZOLE 100 MG PO TABS: ORAL_TABLET | ORAL | Status: DC

## 2014-08-29 NOTE — Telephone Encounter (Signed)
Thanks Ok to refill diflucan now. 100mg  x 10 days.

## 2014-08-29 NOTE — Telephone Encounter (Signed)
Requesting refill on Fluconazole for oral thrush, very painful.  Pt living in [place]. Has been almost a yr since last OV.  Made pt an appt w/ Dr. [name] for 09/14/14 @ 0845.  Pt has not established care in [place], [place].  States he does not have access to a computer to search for a clinic.  Pt to call back with the name and address of pharmacy in [place], [place] to send rx.

## 2014-08-30 NOTE — Telephone Encounter (Signed)
Pt started diflucan yesterday.  Coming for appt 09/14/14.

## 2014-09-14 ENCOUNTER — Ambulatory Visit (INDEPENDENT_AMBULATORY_CARE_PROVIDER_SITE_OTHER): Payer: Medicare Other | Admitting: Infectious Diseases

## 2014-09-14 ENCOUNTER — Other Ambulatory Visit (HOSPITAL_COMMUNITY)
Admission: RE | Admit: 2014-09-14 | Discharge: 2014-09-14 | Disposition: A | Discharge status: Home / Self Care | Payer: Medicare Other | Source: Ambulatory Visit | Attending: Infectious Diseases | Admitting: Infectious Diseases

## 2014-09-14 ENCOUNTER — Encounter: Payer: Self-pay | Admitting: Infectious Diseases

## 2014-09-14 VITALS — BP 148/92 | HR 83 | Temp 98.0°F | Ht 65.0 in | Wt 156.0 lb

## 2014-09-14 DIAGNOSIS — C8591 Non-Hodgkin lymphoma, unspecified, lymph nodes of head, face, and neck: Secondary | ICD-10-CM

## 2014-09-14 DIAGNOSIS — M25519 Pain in unspecified shoulder: Secondary | ICD-10-CM | POA: Insufficient documentation

## 2014-09-14 DIAGNOSIS — Z23 Encounter for immunization: Secondary | ICD-10-CM | POA: Diagnosis not present

## 2014-09-14 DIAGNOSIS — Z113 Encounter for screening for infections with a predominantly sexual mode of transmission: Secondary | ICD-10-CM | POA: Diagnosis not present

## 2014-09-14 DIAGNOSIS — R079 Chest pain, unspecified: Secondary | ICD-10-CM | POA: Diagnosis not present

## 2014-09-14 DIAGNOSIS — M25511 Pain in right shoulder: Secondary | ICD-10-CM | POA: Diagnosis not present

## 2014-09-14 DIAGNOSIS — B20 Human immunodeficiency virus [HIV] disease: Secondary | ICD-10-CM

## 2014-09-14 LAB — COMPLETE METABOLIC PANEL WITH GFR
ALBUMIN: 4.2 g/dL (ref 3.5–5.2)
ALT: 19 U/L (ref 0–53)
AST: 25 U/L (ref 0–37)
Alkaline Phosphatase: 59 U/L (ref 39–117)
BUN: 14 mg/dL (ref 6–23)
CALCIUM: 9 mg/dL (ref 8.4–10.5)
CHLORIDE: 114 meq/L — AB (ref 96–112)
CO2: 22 meq/L (ref 19–32)
Creat: 0.76 mg/dL (ref 0.50–1.35)
GLUCOSE: 90 mg/dL (ref 70–99)
POTASSIUM: 4.2 meq/L (ref 3.5–5.3)
SODIUM: 143 meq/L (ref 135–145)
TOTAL PROTEIN: 7.5 g/dL (ref 6.0–8.3)
Total Bilirubin: 0.5 mg/dL (ref 0.2–1.2)

## 2014-09-14 LAB — RPR

## 2014-09-14 MED ORDER — ELVITEG-COBIC-EMTRICIT-TENOFDF 150-150-200-300 MG PO TABS: 1.0000 | ORAL_TABLET | Freq: Every day | ORAL | Status: DC

## 2014-09-14 NOTE — Assessment & Plan Note (Signed)
Will check MRI given his hx of lymphoma, smoking.

## 2014-09-14 NOTE — Addendum Note (Signed)
Addended by: Landis Gandy on: 09/14/2014 09:51 AM   Modules accepted: Orders

## 2014-09-14 NOTE — Assessment & Plan Note (Signed)
He needs h/o f/u for this. Will arrange.

## 2014-09-14 NOTE — Assessment & Plan Note (Signed)
Will refill his ART. Give him flu shot. He refuses condoms. He states he will continue to get care here. Will give him # for [place] [facility] to see if he can get more local care.  rtc 2-3 months.

## 2014-09-14 NOTE — Progress Notes (Signed)
   Subjective:    Patient ID: [name], male    DOB: 1961/05/02, 53 y.o.   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 has completed 6 weeks of xrt.  Was started pt on stribilid at previous visit. Has been off for last month, states he feels well. Thinks medicine makes him feel bad.  Has moved to [facility] and needs new MD.  Has had thrush, recently tx with diflucan.Has decreased dysphagia.    HIV 1 RNA Quant (copies/mL)  Date Value  08/19/2013 9447*  05/12/2012 22037*  11/19/2010 77*     CD4 T Cell Abs (/uL)  Date Value  08/19/2013 110*  05/12/2012 190*  11/19/2010 280*   Has had shoulder pain on R, few pains in R chest. Has taken "everything you name for it" and it didn't help.  Has limitation of motion. Has also had R knee pain.  Also having jock itch.   Review of Systems     Objective:   Physical Exam  Constitutional: He appears well-developed and well-nourished.  HENT:  Mouth/Throat: No oropharyngeal exudate.  Eyes: EOM are normal. Pupils are equal, round, and reactive to light.  Neck: Neck supple.  Cardiovascular: Normal rate, regular rhythm and normal heart sounds.   Pulmonary/Chest: Effort normal and breath sounds normal.  Abdominal: Soft. Bowel sounds are normal. He exhibits no distension. There is no tenderness.  Musculoskeletal:       Arms: Lymphadenopathy:    He has no cervical adenopathy.          Assessment & Plan:

## 2014-09-14 NOTE — Assessment & Plan Note (Signed)
More musculoskeletal shoulder pain.

## 2014-09-15 LAB — T-HELPER CELL (CD4) - (RCID CLINIC ONLY)
CD4 % Helper T Cell: 8 % — ABNORMAL LOW (ref 33–55)
CD4 T Cell Abs: 90 /uL — ABNORMAL LOW (ref 400–2700)

## 2014-09-15 LAB — URINE CYTOLOGY ANCILLARY ONLY
Chlamydia: NEGATIVE
Neisseria Gonorrhea: NEGATIVE

## 2014-09-20 ENCOUNTER — Inpatient Hospital Stay: Admission: RE | Admit: 2014-09-20 | Payer: Medicare Other | Source: Ambulatory Visit

## 2014-09-25 ENCOUNTER — Other Ambulatory Visit: Payer: Self-pay | Admitting: Infectious Diseases

## 2014-09-25 DIAGNOSIS — R079 Chest pain, unspecified: Secondary | ICD-10-CM

## 2014-09-25 DIAGNOSIS — M25511 Pain in right shoulder: Secondary | ICD-10-CM

## 2014-09-25 DIAGNOSIS — C8591 Non-Hodgkin lymphoma, unspecified, lymph nodes of head, face, and neck: Secondary | ICD-10-CM

## 2014-09-25 DIAGNOSIS — B20 Human immunodeficiency virus [HIV] disease: Secondary | ICD-10-CM

## 2014-09-26 ENCOUNTER — Inpatient Hospital Stay: Admission: RE | Admit: 2014-09-26 | Payer: Medicare Other | Source: Ambulatory Visit

## 2014-10-17 ENCOUNTER — Other Ambulatory Visit: Payer: Medicare Other

## 2014-10-23 ENCOUNTER — Inpatient Hospital Stay: Admission: RE | Admit: 2014-10-23 | Payer: Medicare Other | Source: Ambulatory Visit

## 2014-10-23 ENCOUNTER — Other Ambulatory Visit: Payer: Medicare Other

## 2014-11-20 ENCOUNTER — Other Ambulatory Visit: Payer: Medicare Other

## 2014-11-20 ENCOUNTER — Inpatient Hospital Stay: Admission: RE | Admit: 2014-11-20 | Payer: Medicare Other | Source: Ambulatory Visit

## 2014-12-18 ENCOUNTER — Ambulatory Visit: Payer: Medicare Other | Admitting: Infectious Diseases

## 2015-02-21 ENCOUNTER — Telehealth: Payer: Self-pay | Admitting: Infectious Diseases

## 2015-02-21 NOTE — Telephone Encounter (Signed)
called pt to see if he has moved to [facility]

## 2015-05-02 ENCOUNTER — Ambulatory Visit: Payer: Medicare Other | Admitting: Infectious Diseases

## 2015-08-13 ENCOUNTER — Telehealth: Payer: Self-pay | Admitting: *Deleted

## 2015-08-13 DIAGNOSIS — B370 Candidal stomatitis: Secondary | ICD-10-CM

## 2015-08-13 MED ORDER — FLUCONAZOLE 100 MG PO TABS: ORAL_TABLET | ORAL | Status: DC

## 2015-08-13 NOTE — Telephone Encounter (Signed)
Oral "Thrush," very painful, unable to eat.  Requesting Diflucan rx for "thrush."  Off HIV rx, Stribild, for many months.  Stribild caused him problems and he stopped taking it.  Pt scheduled for follow-up appt with Dr. [name] for Tuesday, Oct. 11 at 1:45 PM. Diflucan rx still active on the pt's medication profile.  Will refill once.  Notified pt than he can pick up medication.

## 2015-08-28 ENCOUNTER — Ambulatory Visit: Payer: Medicare Other | Admitting: Infectious Diseases

## 2015-09-26 ENCOUNTER — Ambulatory Visit: Payer: Medicare Other | Admitting: Infectious Diseases

## 2015-10-16 ENCOUNTER — Ambulatory Visit: Payer: Medicare Other | Admitting: Infectious Diseases

## 2015-12-05 ENCOUNTER — Other Ambulatory Visit: Payer: Self-pay | Admitting: *Deleted

## 2015-12-05 ENCOUNTER — Telehealth: Payer: Self-pay | Admitting: *Deleted

## 2015-12-05 DIAGNOSIS — B370 Candidal stomatitis: Secondary | ICD-10-CM

## 2015-12-05 DIAGNOSIS — B20 Human immunodeficiency virus [HIV] disease: Secondary | ICD-10-CM

## 2015-12-05 DIAGNOSIS — K130 Diseases of lips: Secondary | ICD-10-CM

## 2015-12-05 MED ORDER — ELVITEG-COBIC-EMTRICIT-TENOFDF 150-150-200-300 MG PO TABS
1.0000 | ORAL_TABLET | Freq: Every day | ORAL | Status: DC
Start: 2015-12-05 — End: 2016-05-29

## 2015-12-05 MED ORDER — MAGIC MOUTHWASH: 5.0000 mL | Freq: Three times a day (TID) | ORAL | Status: DC | PRN

## 2015-12-05 MED ORDER — FLUCONAZOLE 100 MG PO TABS
ORAL_TABLET | ORAL | Status: DC
Start: 2015-12-05 — End: 2016-04-07

## 2015-12-05 NOTE — Telephone Encounter (Signed)
Please refill HIV meds, diflucan thakns

## 2015-12-05 NOTE — Telephone Encounter (Signed)
Patient has been "off all medications many many months", has "swelling everywhere" and feels his thrush is back.  He has not been seen in 15 months, wants to return to Dr. [name].  Is asking for refills of medication, specifically fluconazole. Patient scheduled for 1/31 with Dr. [name], demographics updated. Please advise on fluconazole refill. Landis Gandy, RN

## 2016-02-21 DIAGNOSIS — S60032A Contusion of left middle finger without damage to nail, initial encounter: Secondary | ICD-10-CM | POA: Diagnosis not present

## 2016-02-21 DIAGNOSIS — S60022A Contusion of left index finger without damage to nail, initial encounter: Secondary | ICD-10-CM | POA: Diagnosis not present

## 2016-02-21 DIAGNOSIS — S60222A Contusion of left hand, initial encounter: Secondary | ICD-10-CM | POA: Diagnosis not present

## 2016-02-21 DIAGNOSIS — Z880 Allergy status to penicillin: Secondary | ICD-10-CM | POA: Diagnosis not present

## 2016-04-07 ENCOUNTER — Other Ambulatory Visit: Payer: Self-pay | Admitting: *Deleted

## 2016-04-07 DIAGNOSIS — K130 Diseases of lips: Secondary | ICD-10-CM

## 2016-04-07 DIAGNOSIS — B370 Candidal stomatitis: Secondary | ICD-10-CM

## 2016-04-07 DIAGNOSIS — B20 Human immunodeficiency virus [HIV] disease: Secondary | ICD-10-CM

## 2016-04-07 MED ORDER — MAGIC MOUTHWASH
5.0000 mL | Freq: Three times a day (TID) | ORAL | Status: DC | PRN
Start: 2016-04-07 — End: 2018-05-26

## 2016-04-07 MED ORDER — FLUCONAZOLE 100 MG PO TABS: ORAL_TABLET | ORAL | Status: DC

## 2016-04-07 NOTE — Progress Notes (Signed)
1 refill ok per Dr. [name].  Landis Gandy, RN

## 2016-04-22 ENCOUNTER — Encounter: Payer: Self-pay | Admitting: Infectious Diseases

## 2016-04-22 ENCOUNTER — Ambulatory Visit (INDEPENDENT_AMBULATORY_CARE_PROVIDER_SITE_OTHER): Payer: Medicare Other | Admitting: Infectious Diseases

## 2016-04-22 VITALS — BP 128/75 | HR 75 | Temp 97.9°F | Wt 157.0 lb

## 2016-04-22 DIAGNOSIS — C8591 Non-Hodgkin lymphoma, unspecified, lymph nodes of head, face, and neck: Secondary | ICD-10-CM | POA: Diagnosis not present

## 2016-04-22 DIAGNOSIS — B20 Human immunodeficiency virus [HIV] disease: Secondary | ICD-10-CM | POA: Diagnosis not present

## 2016-04-22 DIAGNOSIS — L0232 Furuncle of buttock: Secondary | ICD-10-CM

## 2016-04-22 MED ORDER — DOLUTEGRAVIR SODIUM 50 MG PO TABS: 50.0000 mg | ORAL_TABLET | Freq: Every day | ORAL | Status: DC

## 2016-04-22 MED ORDER — SULFAMETHOXAZOLE-TRIMETHOPRIM 400-80 MG PO TABS: 1.0000 | ORAL_TABLET | ORAL | Status: DC

## 2016-04-22 MED ORDER — DARUNAVIR-COBICISTAT 800-150 MG PO TABS: 1.0000 | ORAL_TABLET | Freq: Every day | ORAL | Status: DC

## 2016-04-22 MED ORDER — SULFAMETHOXAZOLE-TRIMETHOPRIM 400-80 MG PO TABS: 1.0000 | ORAL_TABLET | Freq: Two times a day (BID) | ORAL | Status: DC

## 2016-04-22 MED ORDER — EMTRICITABINE-TENOFOVIR AF 200-25 MG PO TABS: 1.0000 | ORAL_TABLET | Freq: Every day | ORAL | Status: DC

## 2016-04-22 NOTE — Assessment & Plan Note (Signed)
Will start him on prezcobix, descovy, dolutegravir.  Will start him on bactrim Offered/refused condoms.  Will see him back in 2 months.  Does not live area so cannot get him Ambre.

## 2016-04-22 NOTE — Assessment & Plan Note (Signed)
Will get him back in with h/o

## 2016-04-22 NOTE — Addendum Note (Signed)
Addended by: Rodman Key A on: 04/22/2016 04:51 PM   Modules accepted: Orders

## 2016-04-22 NOTE — Assessment & Plan Note (Signed)
Will start him on bactrim.

## 2016-04-22 NOTE — Progress Notes (Signed)
   Subjective:    Patient ID: [name], male    DOB: July 24, 1961, 55 y.o.   MRN: [id number]  HPI 55 yo M HIV+ and AVN of both hips (L THR 01-04-2010, R THR 04-03-2010).  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 has completed 6 weeks of xrt.   He has been feeling well as he is off his stribild. He felt like it gave him sweats, jittery feeling.  Sluggish.  He previously took ISN/TRV/DRVr. He took these irregularly.   Has eaten less as his sister passed away after catching pneumonia.   HIV 1 RNA QUANT (copies/mL)  Date Value  08/19/2013 9447*  05/12/2012 22037*  11/19/2010 77*   CD4 T CELL ABS  Date Value  09/14/2014 90 /uL*  08/19/2013 110 /uL*  05/12/2012 190 cmm*     Review of Systems  Constitutional: Negative for fever, chills, appetite change and unexpected weight change.  Respiratory: Positive for shortness of breath.   Gastrointestinal: Negative for diarrhea and constipation.  Genitourinary: Negative for difficulty urinating.  Psychiatric/Behavioral: Negative for dysphoric mood.       Objective:   Physical Exam  Constitutional: He appears well-developed and well-nourished.  HENT:  Mouth/Throat: No oropharyngeal exudate.  Eyes: EOM are normal. Pupils are equal, round, and reactive to light.  Neck: Neck supple.  Cardiovascular: Normal rate, regular rhythm and normal heart sounds.   Pulmonary/Chest: Effort normal and breath sounds normal.  Abdominal: Soft. Bowel sounds are normal. There is no tenderness. There is no rebound.  Musculoskeletal: He exhibits no edema.  Lymphadenopathy:    He has no cervical adenopathy.       Assessment & Plan:

## 2016-05-29 ENCOUNTER — Other Ambulatory Visit: Payer: Self-pay | Admitting: *Deleted

## 2016-05-29 DIAGNOSIS — B20 Human immunodeficiency virus [HIV] disease: Secondary | ICD-10-CM

## 2016-05-29 DIAGNOSIS — L0232 Furuncle of buttock: Secondary | ICD-10-CM

## 2016-05-29 MED ORDER — SULFAMETHOXAZOLE-TRIMETHOPRIM 400-80 MG PO TABS: 1.0000 | ORAL_TABLET | ORAL | Status: DC

## 2016-05-29 MED ORDER — DARUNAVIR-COBICISTAT 800-150 MG PO TABS: 1.0000 | ORAL_TABLET | Freq: Every day | ORAL | Status: DC

## 2016-05-29 MED ORDER — EMTRICITABINE-TENOFOVIR AF 200-25 MG PO TABS: 1.0000 | ORAL_TABLET | Freq: Every day | ORAL | Status: DC

## 2016-05-29 MED ORDER — DOLUTEGRAVIR SODIUM 50 MG PO TABS
50.0000 mg | ORAL_TABLET | Freq: Every day | ORAL | Status: DC
Start: 2016-05-29 — End: 2017-01-19

## 2016-06-30 ENCOUNTER — Telehealth: Payer: Self-pay | Admitting: *Deleted

## 2016-07-01 NOTE — Telephone Encounter (Signed)
error 

## 2016-07-30 ENCOUNTER — Ambulatory Visit: Payer: Medicare Other | Admitting: Infectious Diseases

## 2016-07-30 ENCOUNTER — Ambulatory Visit: Payer: Medicare Other | Admitting: Hematology and Oncology

## 2016-09-05 ENCOUNTER — Telehealth: Payer: Self-pay | Admitting: *Deleted

## 2016-09-05 NOTE — Telephone Encounter (Signed)
Patient called to speak to Dr. [name] regarding a letter for social security. I scheduled him an appt with MD for 10/13/16. He actually wanted to be seen sooner because the letter needs to be in before the end of the month. Advised patient to fax the form with a note to Dr. [name] stating exactly what he needs. I will talk to Dr. [name] about this on Tuesday 09/09/16, when he returns to clinic. Suggested PCP and patient stated that he will be returning to reside in [place] shortly, (I believe he is in [place], [place] at this time). We may be able to get him a primary at that time.

## 2016-10-13 ENCOUNTER — Telehealth: Payer: Self-pay | Admitting: *Deleted

## 2016-10-13 ENCOUNTER — Ambulatory Visit: Payer: Medicare Other | Admitting: Infectious Diseases

## 2016-10-13 NOTE — Telephone Encounter (Signed)
Left message notifying patient of missed visit. Landis Gandy, RN

## 2017-01-19 ENCOUNTER — Ambulatory Visit (INDEPENDENT_AMBULATORY_CARE_PROVIDER_SITE_OTHER): Payer: Medicare Other | Admitting: Infectious Diseases

## 2017-01-19 ENCOUNTER — Encounter (INDEPENDENT_AMBULATORY_CARE_PROVIDER_SITE_OTHER): Payer: Self-pay

## 2017-01-19 ENCOUNTER — Encounter: Payer: Self-pay | Admitting: Infectious Diseases

## 2017-01-19 VITALS — BP 178/96 | HR 70 | Temp 98.7°F | Ht 65.0 in | Wt 173.0 lb

## 2017-01-19 DIAGNOSIS — I10 Essential (primary) hypertension: Secondary | ICD-10-CM

## 2017-01-19 DIAGNOSIS — L0232 Furuncle of buttock: Secondary | ICD-10-CM

## 2017-01-19 DIAGNOSIS — Z23 Encounter for immunization: Secondary | ICD-10-CM | POA: Diagnosis not present

## 2017-01-19 DIAGNOSIS — Z79899 Other long term (current) drug therapy: Secondary | ICD-10-CM | POA: Diagnosis not present

## 2017-01-19 DIAGNOSIS — C8591 Non-Hodgkin lymphoma, unspecified, lymph nodes of head, face, and neck: Secondary | ICD-10-CM | POA: Diagnosis not present

## 2017-01-19 DIAGNOSIS — Z113 Encounter for screening for infections with a predominantly sexual mode of transmission: Secondary | ICD-10-CM

## 2017-01-19 DIAGNOSIS — B20 Human immunodeficiency virus [HIV] disease: Secondary | ICD-10-CM | POA: Diagnosis not present

## 2017-01-19 MED ORDER — DARUNAVIR ETHANOLATE 400 MG PO TABS: 800.0000 mg | ORAL_TABLET | Freq: Every day | ORAL | Status: DC

## 2017-01-19 MED ORDER — ELVITEG-COBIC-EMTRICIT-TENOFAF 150-150-200-10 MG PO TABS: 1.0000 | ORAL_TABLET | Freq: Every day | ORAL | 3 refills | Status: DC

## 2017-01-19 MED ORDER — BENAZEPRIL HCL 5 MG PO TABS: 5.0000 mg | ORAL_TABLET | Freq: Every day | ORAL | 3 refills | Status: DC

## 2017-01-19 MED ORDER — SULFAMETHOXAZOLE-TRIMETHOPRIM 400-80 MG PO TABS: 1.0000 | ORAL_TABLET | Freq: Two times a day (BID) | ORAL | 1 refills | Status: DC

## 2017-01-19 NOTE — Assessment & Plan Note (Signed)
Will get him back with h/o.

## 2017-01-19 NOTE — Addendum Note (Signed)
Addended by: Roma Kayser on: 01/19/2017 12:51 PM   Modules accepted: Orders

## 2017-01-19 NOTE — Progress Notes (Signed)
   Subjective:    Patient ID: [name], male    DOB: 07-02-1961, 56 y.o.   MRN: [id number]  HPI 56 yo M HIV+ and AVN of both hips (L THR 01-04-2010, R THR 04-03-2010).  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 has completed 6 weeks of xrt.   Still has boil on gluteus. States it has never healed. Still draining.  He has been off his ART at least 1 month. States it "makes me feel like crap".  He is in [place] now. Was prev getting at [facility].   Has not had h/o f/o.   HIV 1 RNA Quant (copies/mL)  Date Value  08/19/2013 9,447 (H)  05/12/2012 22,037 (H)  11/19/2010 77 (H)   CD4 T Cell Abs  Date Value  09/14/2014 90 /uL (L)  08/19/2013 110 /uL (L)  05/12/2012 190 cmm (L)   Wt up 16#, using marijuana to help with apetite.   Review of Systems  Constitutional: Negative for appetite change and unexpected weight change.  Eyes: Positive for visual disturbance.  Respiratory: Negative for cough and shortness of breath.   Gastrointestinal: Positive for constipation. Negative for diarrhea.  Genitourinary: Negative for difficulty urinating.  Skin: Positive for wound.       Objective:   Physical Exam  Constitutional: He appears well-developed and well-nourished.  HENT:  Mouth/Throat: No oropharyngeal exudate.  Eyes: EOM are normal. Pupils are equal, round, and reactive to light.  Neck: Neck supple.  Cardiovascular: Normal rate, regular rhythm and normal heart sounds.   Pulmonary/Chest: Effort normal and breath sounds normal.  Abdominal: Soft. Bowel sounds are normal. There is no tenderness. There is no rebound.  Genitourinary:     Musculoskeletal: He exhibits no edema.  Lymphadenopathy:    He has no cervical adenopathy.      Assessment & Plan:

## 2017-01-19 NOTE — Assessment & Plan Note (Addendum)
Will simplify his ART- genvoya/drv.  See him back in 3 months with CD4, VL and genotype.  See if [facility] can see him.  Will give him mening and flu vax.

## 2017-01-19 NOTE — Assessment & Plan Note (Signed)
Will give him bactrim for 2 weeks.

## 2017-01-19 NOTE — Assessment & Plan Note (Signed)
He has been off his ACE-I.  Will resume.

## 2017-01-20 ENCOUNTER — Telehealth: Payer: Self-pay

## 2017-01-20 NOTE — Telephone Encounter (Signed)
Called to speak with Pt about scheduling and appointment with oncology. Pt didn't answer. Will try back again later.

## 2017-01-28 ENCOUNTER — Encounter: Payer: Self-pay | Admitting: Hematology

## 2017-02-01 ENCOUNTER — Telehealth: Payer: Self-pay | Admitting: Oncology

## 2017-02-01 NOTE — Telephone Encounter (Signed)
Cld the pt on 3/16 & 3/18 to make aware of appt that has been scheduled for the pt to see Dr. Thana Farr on 3/19. Lft vm w/the appt date and time.

## 2017-02-02 ENCOUNTER — Ambulatory Visit: Payer: Medicare Other | Admitting: Oncology

## 2017-02-04 ENCOUNTER — Telehealth: Payer: Self-pay | Admitting: *Deleted

## 2017-02-04 NOTE — Telephone Encounter (Signed)
Patient left message in triage asking for a call back. RN returned the call, left message asking him to call back if he still needed something. RN asked that he please leave more information in his message. Landis Gandy, RN

## 2017-04-21 ENCOUNTER — Ambulatory Visit: Payer: Medicare Other | Admitting: Infectious Diseases

## 2017-04-23 ENCOUNTER — Other Ambulatory Visit (HOSPITAL_COMMUNITY)
Admission: RE | Admit: 2017-04-23 | Discharge: 2017-04-23 | Disposition: A | Discharge status: Home / Self Care | Payer: Medicare Other | Source: Ambulatory Visit | Attending: Infectious Diseases | Admitting: Infectious Diseases

## 2017-04-23 ENCOUNTER — Encounter: Payer: Self-pay | Admitting: Infectious Diseases

## 2017-04-23 ENCOUNTER — Ambulatory Visit (INDEPENDENT_AMBULATORY_CARE_PROVIDER_SITE_OTHER): Payer: Medicare Other | Admitting: Infectious Diseases

## 2017-04-23 ENCOUNTER — Telehealth: Payer: Self-pay | Admitting: Hematology and Oncology

## 2017-04-23 VITALS — BP 170/100 | HR 66 | Temp 98.5°F | Ht 66.0 in | Wt 168.0 lb

## 2017-04-23 DIAGNOSIS — B370 Candidal stomatitis: Secondary | ICD-10-CM

## 2017-04-23 DIAGNOSIS — B356 Tinea cruris: Secondary | ICD-10-CM | POA: Diagnosis not present

## 2017-04-23 DIAGNOSIS — C8591 Non-Hodgkin lymphoma, unspecified, lymph nodes of head, face, and neck: Secondary | ICD-10-CM | POA: Diagnosis not present

## 2017-04-23 DIAGNOSIS — Z79899 Other long term (current) drug therapy: Secondary | ICD-10-CM

## 2017-04-23 DIAGNOSIS — L0232 Furuncle of buttock: Secondary | ICD-10-CM

## 2017-04-23 DIAGNOSIS — B20 Human immunodeficiency virus [HIV] disease: Secondary | ICD-10-CM | POA: Insufficient documentation

## 2017-04-23 DIAGNOSIS — Z113 Encounter for screening for infections with a predominantly sexual mode of transmission: Secondary | ICD-10-CM | POA: Diagnosis not present

## 2017-04-23 LAB — COMPREHENSIVE METABOLIC PANEL
ALBUMIN: 3.7 g/dL (ref 3.6–5.1)
ALT: 26 U/L (ref 9–46)
AST: 26 U/L (ref 10–35)
Alkaline Phosphatase: 50 U/L (ref 40–115)
BILIRUBIN TOTAL: 0.5 mg/dL (ref 0.2–1.2)
BUN: 15 mg/dL (ref 7–25)
CHLORIDE: 109 mmol/L (ref 98–110)
CO2: 22 mmol/L (ref 20–31)
CREATININE: 0.67 mg/dL — AB (ref 0.70–1.33)
Calcium: 9 mg/dL (ref 8.6–10.3)
Glucose, Bld: 84 mg/dL (ref 65–99)
Potassium: 3.6 mmol/L (ref 3.5–5.3)
SODIUM: 141 mmol/L (ref 135–146)
TOTAL PROTEIN: 7.1 g/dL (ref 6.1–8.1)

## 2017-04-23 LAB — CBC WITH DIFFERENTIAL/PLATELET
BASOS ABS: 31 {cells}/uL (ref 0–200)
Basophils Relative: 1 %
EOS PCT: 3 %
Eosinophils Absolute: 93 cells/uL (ref 15–500)
HCT: 37.8 % — ABNORMAL LOW (ref 38.5–50.0)
Hemoglobin: 11.9 g/dL — ABNORMAL LOW (ref 13.2–17.1)
LYMPHS PCT: 34 %
Lymphs Abs: 1054 cells/uL (ref 850–3900)
MCH: 27.1 pg (ref 27.0–33.0)
MCHC: 31.5 g/dL — AB (ref 32.0–36.0)
MCV: 86.1 fL (ref 80.0–100.0)
MONO ABS: 465 {cells}/uL (ref 200–950)
MONOS PCT: 15 %
MPV: 9.6 fL (ref 7.5–12.5)
NEUTROS ABS: 1457 {cells}/uL — AB (ref 1500–7800)
NEUTROS PCT: 47 %
Platelets: 235 10*3/uL (ref 140–400)
RBC: 4.39 MIL/uL (ref 4.20–5.80)
RDW: 15.9 % — AB (ref 11.0–15.0)
WBC: 3.1 10*3/uL — ABNORMAL LOW (ref 3.8–10.8)

## 2017-04-23 LAB — LIPID PANEL
Cholesterol: 162 mg/dL (ref <200)
HDL: 46 mg/dL (ref >40)
LDL Cholesterol: 97 mg/dL (ref <100)
TRIGLYCERIDES: 97 mg/dL (ref <150)
Total CHOL/HDL Ratio: 3.5 Ratio (ref <5.0)
VLDL: 19 mg/dL (ref <30)

## 2017-04-23 MED ORDER — FLUCONAZOLE 100 MG PO TABS: ORAL_TABLET | ORAL | 0 refills | Status: DC

## 2017-04-23 MED ORDER — SULFAMETHOXAZOLE-TRIMETHOPRIM 400-80 MG PO TABS: 1.0000 | ORAL_TABLET | Freq: Two times a day (BID) | ORAL | 1 refills | Status: DC

## 2017-04-23 NOTE — Addendum Note (Signed)
Addended byJohnnye Sima, JEFFREY on: 04/23/2017 04:07 PM   Modules accepted: Orders

## 2017-04-23 NOTE — Telephone Encounter (Signed)
Received a call from [facility] at Dr. Algis Downs office to schedule the pt an appt. [name] is a former pt of Dr. Beryle Beams. I explained to Carolee Rota that the pt has no showed twice and that if he no shows for this appt he will need to pursue care at another facility. She voiced understanding. Will mail the pt a letter with new appt. Pt has been scheduled to see Dr. Lebron Conners on 6/14 at [facility] will notify the pt

## 2017-04-23 NOTE — Assessment & Plan Note (Signed)
I asked him to take anbx for 2 weeks, if not resolved- will consider I & D.

## 2017-04-23 NOTE — Assessment & Plan Note (Signed)
Will get him in with onc

## 2017-04-23 NOTE — Assessment & Plan Note (Signed)
Will give him rx for diflucan

## 2017-04-23 NOTE — Assessment & Plan Note (Signed)
Will check his labs today copay cards rtc in 4 months.

## 2017-04-23 NOTE — Progress Notes (Signed)
   Subjective:    Patient ID: [name], male    DOB: 1961/06/12, 56 y.o.   MRN: [id number]  HPI 56 yo M HIV+ and AVN of both hips (L THR 01-04-2010, R THR 04-03-2010).  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 has completed 6 weeks of xrt.   Today he has rash in his groin He has continued pain bleeding from his gluteal boil Has L index finger ingrown nail Has not yet seen onc.  Ran out of his ART 1 week ago.    HIV 1 RNA Quant (copies/mL)  Date Value  08/19/2013 9,447 (H)  05/12/2012 22,037 (H)  11/19/2010 77 (H)   CD4 T Cell Abs  Date Value  09/14/2014 90 /uL (L)  08/19/2013 110 /uL (L)  05/12/2012 190 cmm (L)    Review of Systems  Constitutional: Negative for appetite change, chills, fever and unexpected weight change.  Respiratory: Negative for cough and shortness of breath.   Gastrointestinal: Negative for constipation and diarrhea.  Genitourinary: Negative for difficulty urinating.  Skin: Positive for rash and wound.  Psychiatric/Behavioral: Negative for dysphoric mood.   SOB with cutting grass last week, also constipated at that time.     Objective:   Physical Exam  Constitutional: He appears well-developed and well-nourished.  Eyes: EOM are normal. Pupils are equal, round, and reactive to light.  Neck: Neck supple.  Cardiovascular: Normal rate, regular rhythm and normal heart sounds.   Pulmonary/Chest: Effort normal and breath sounds normal.  Abdominal: Soft. Bowel sounds are normal. There is no tenderness. There is no rebound.  Musculoskeletal: He exhibits no edema.  Lymphadenopathy:    He has no cervical adenopathy.  Skin:         Assessment & Plan:

## 2017-04-24 LAB — T-HELPER CELL (CD4) - (RCID CLINIC ONLY)
CD4 % Helper T Cell: 8 % — ABNORMAL LOW (ref 33–55)
CD4 T CELL ABS: 80 /uL — AB (ref 400–2700)

## 2017-04-24 LAB — RPR

## 2017-04-27 ENCOUNTER — Encounter (HOSPITAL_COMMUNITY): Payer: Self-pay | Admitting: Emergency Medicine

## 2017-04-27 ENCOUNTER — Telehealth: Payer: Self-pay | Admitting: *Deleted

## 2017-04-27 ENCOUNTER — Ambulatory Visit (HOSPITAL_COMMUNITY)
Admission: EM | Admit: 2017-04-27 | Discharge: 2017-04-27 | Disposition: A | Discharge status: Home / Self Care | Payer: Medicare Other | Attending: Internal Medicine | Admitting: Internal Medicine

## 2017-04-27 DIAGNOSIS — L03012 Cellulitis of left finger: Secondary | ICD-10-CM

## 2017-04-27 LAB — URINE CYTOLOGY ANCILLARY ONLY
Chlamydia: NEGATIVE
Neisseria Gonorrhea: NEGATIVE

## 2017-04-27 MED ORDER — HYDROCODONE-ACETAMINOPHEN 5-325 MG PO TABS: 2.0000 | ORAL_TABLET | ORAL | 0 refills | Status: DC | PRN

## 2017-04-27 NOTE — ED Provider Notes (Signed)
CSN: [id number]     Arrival date & time 04/27/17  1418 History   None    Chief Complaint  Patient presents with  . Finger Injury   (Consider location/radiation/quality/duration/timing/severity/associated sxs/prior Treatment) Patient c/o swollen left index finger.   The history is provided by the patient.  Hand Pain  This is a new problem. The problem occurs constantly. The problem has not changed since onset.Nothing aggravates the symptoms. Nothing relieves the symptoms.    Past Medical History:  Diagnosis Date  . 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 Candidiasis of mouth   . Chest pain, unspecified   . Diseases of lips    Angular cheilitis  . HIV infection ([place])   . Hypertension   . Lymphoma ([place])   . Personal history of unspecified disease of respiratory system   . Pneumocystosis ([place])   . Shingles   . Tobacco use    History reviewed. No pertinent surgical history. Family History  Problem Relation Age of Onset  . Heart disease Mother   . Diabetes Mother   . Diabetes Sister   . Diabetes Maternal Grandmother    Social History  Substance Use Topics  . Smoking status: Former Smoker    Packs/day: 1.00    Years: 35.00    Quit date: 11/23/2015  . Smokeless tobacco: Never Used     Comment: would like help quitting  . Alcohol use 0.0 oz/week     Comment: beer weekends    Review of Systems  Constitutional: Negative.   HENT: Negative.   Eyes: Negative.   Respiratory: Negative.   Cardiovascular: Negative.   Endocrine: Negative.   Genitourinary: Negative.   Skin: Positive for wound.  Allergic/Immunologic: Negative.   Neurological: Negative.   Hematological: Negative.   Psychiatric/Behavioral: Negative.     Allergies  Penicillins  Home Medications   Prior to Admission medications   Medication Sig Start Date End Date Taking? Authorizing Provider  fluconazole (DIFLUCAN) 100 MG tablet Take 1 tablet by mouth daily for one week then switched  to one tablet each week 04/23/17  Yes [name], MD  sulfamethoxazole-trimethoprim (BACTRIM) 400-80 MG tablet Take 1 tablet by mouth 2 (two) times daily. 04/23/17  Yes [name], MD  benazepril (LOTENSIN) 5 MG tablet Take 1 tablet (5 mg total) by mouth daily. Patient not taking: Reported on 04/23/2017 01/19/17   [name], MD  elvitegravir-cobicistat-emtricitabine-tenofovir (GENVOYA) 150-150-200-10 MG TABS tablet Take 1 tablet by mouth daily with breakfast. Patient not taking: Reported on 04/23/2017 01/19/17   [name], MD  HYDROcodone-acetaminophen (NORCO/VICODIN) 5-325 MG tablet Take 2 tablets by mouth every 4 (four) hours as needed. 04/27/17   Lysbeth Penner, FNP  magic mouthwash SOLN Take 5 mLs by mouth 3 (three) times daily as needed. Patient not taking: Reported on 04/23/2017 04/07/16   [name], MD  senna-docusate (SENOKOT-S) 8.6-50 MG per tablet Take 1 tablet by mouth daily. 05/26/12 05/26/13  [name], MD   Meds Ordered and Administered this Visit  Medications - No data to display  BP (!) 156/94 (BP Location: Right Arm)   Pulse (!) 54   Temp 98.1 F (36.7 C) (Oral)   Resp 18   SpO2 98%  No data found.   Physical Exam  Constitutional: He appears well-developed and well-nourished.  HENT:  Head: Normocephalic and atraumatic.  Eyes: Conjunctivae and EOM are normal. Pupils are equal, round, and reactive to  light.  Neck: Normal range of motion. Neck supple.  Cardiovascular: Normal rate, regular rhythm and normal heart sounds.   Pulmonary/Chest: Effort normal and breath sounds normal.  Skin:  Left index finger with paronychia  Nursing note and vitals reviewed.   Urgent Care Course     .Marland KitchenIncision and Drainage Date/Time: 04/27/2017 8:51 PM Performed by: Lysbeth Penner Authorized by: Sherlene Shams   Consent:    Consent obtained:  Verbal   Consent given by:  Patient   Risks discussed:  Bleeding   Alternatives discussed:  No  treatment Location:    Type:  Abscess   Location:  Upper extremity   Upper extremity location:  Finger   Finger location:  L index finger Pre-procedure details:    Skin preparation:  Betadine Anesthesia (see MAR for exact dosages):    Anesthesia method:  Topical application Procedure type:    Complexity:  Simple Procedure details:    Needle aspiration: no     Incision types:  Stab incision   Incision depth:  Dermal   Scalpel blade:  11   Drainage:  Bloody and purulent   Drainage amount:  Scant Post-procedure details:    Patient tolerance of procedure:  Tolerated well, no immediate complications   (including critical care time)  Labs Review Labs Reviewed - No data to display  Imaging Review No results found.   Visual Acuity Review  Right Eye Distance:   Left Eye Distance:   Bilateral Distance:    Right Eye Near:   Left Eye Near:    Bilateral Near:         MDM   1. Acute paronychia of finger of left hand    Incision and drainage Continue Bactrim DS one po bid Norco 5/325 one po q 6 hours prn #6      Lysbeth Penner, FNP 04/27/17 2052

## 2017-04-27 NOTE — Telephone Encounter (Signed)
Patient called c/o his finger on his left hand has worsened. He said it is very painful and has actually caused him to loose sleep. He was given two antibiotics; fluconazole and bactrim. He filled the fluconazole, but not the other. He initially asked to be referred to a hand specialist; however, because of the amount of pain he is in he is going to be evaluated at Urgent Care by [facility]. If he is still having ongoing issues he will call us back regarding the referral. Myrtis Hopping CMA

## 2017-04-27 NOTE — ED Triage Notes (Addendum)
Left index finger pain, pain onset Thursday night.  pcp suggested it was a fungus . Started patient on antibiotic and antifungal medicine.   While waiting in lobby, left middle finger started hurting.

## 2017-04-28 LAB — HIV-1 RNA QUANT-NO REFLEX-BLD
HIV 1 RNA Quant: 80500 copies/mL — ABNORMAL HIGH
HIV-1 RNA QUANT, LOG: 4.91 {Log_copies}/mL — AB

## 2017-04-30 ENCOUNTER — Ambulatory Visit: Payer: Medicare Other | Admitting: Hematology and Oncology

## 2017-05-20 ENCOUNTER — Ambulatory Visit (HOSPITAL_COMMUNITY)
Admission: EM | Admit: 2017-05-20 | Discharge: 2017-05-20 | Disposition: A | Discharge status: Nursing Home (Includes ICF) | Payer: Medicare Other

## 2017-05-20 ENCOUNTER — Emergency Department (HOSPITAL_COMMUNITY): Payer: Medicare Other

## 2017-05-20 ENCOUNTER — Emergency Department (HOSPITAL_COMMUNITY)
Admission: EM | Admit: 2017-05-20 | Discharge: 2017-05-20 | Disposition: A | Discharge status: Home / Self Care | Payer: Medicare Other | Attending: Emergency Medicine | Admitting: Emergency Medicine

## 2017-05-20 ENCOUNTER — Encounter (HOSPITAL_COMMUNITY): Payer: Self-pay | Admitting: Emergency Medicine

## 2017-05-20 DIAGNOSIS — R1084 Generalized abdominal pain: Secondary | ICD-10-CM | POA: Diagnosis present

## 2017-05-20 DIAGNOSIS — R1031 Right lower quadrant pain: Secondary | ICD-10-CM | POA: Diagnosis not present

## 2017-05-20 DIAGNOSIS — Z87891 Personal history of nicotine dependence: Secondary | ICD-10-CM | POA: Diagnosis not present

## 2017-05-20 DIAGNOSIS — B20 Human immunodeficiency virus [HIV] disease: Secondary | ICD-10-CM | POA: Insufficient documentation

## 2017-05-20 DIAGNOSIS — R109 Unspecified abdominal pain: Secondary | ICD-10-CM | POA: Diagnosis not present

## 2017-05-20 DIAGNOSIS — A09 Infectious gastroenteritis and colitis, unspecified: Secondary | ICD-10-CM | POA: Insufficient documentation

## 2017-05-20 DIAGNOSIS — I10 Essential (primary) hypertension: Secondary | ICD-10-CM | POA: Diagnosis not present

## 2017-05-20 DIAGNOSIS — K529 Noninfective gastroenteritis and colitis, unspecified: Secondary | ICD-10-CM | POA: Diagnosis not present

## 2017-05-20 LAB — URINALYSIS, ROUTINE W REFLEX MICROSCOPIC
Bilirubin Urine: NEGATIVE
GLUCOSE, UA: NEGATIVE mg/dL
Ketones, ur: NEGATIVE mg/dL
Leukocytes, UA: NEGATIVE
Nitrite: NEGATIVE
PH: 6 (ref 5.0–8.0)
Protein, ur: 100 mg/dL — AB
SPECIFIC GRAVITY, URINE: 1.035 — AB (ref 1.005–1.030)

## 2017-05-20 LAB — CBC
HCT: 40 % (ref 39.0–52.0)
Hemoglobin: 12.7 g/dL — ABNORMAL LOW (ref 13.0–17.0)
MCH: 27.7 pg (ref 26.0–34.0)
MCHC: 31.8 g/dL (ref 30.0–36.0)
MCV: 87.1 fL (ref 78.0–100.0)
Platelets: 249 10*3/uL (ref 150–400)
RBC: 4.59 MIL/uL (ref 4.22–5.81)
RDW: 14.8 % (ref 11.5–15.5)
WBC: 4.6 10*3/uL (ref 4.0–10.5)

## 2017-05-20 LAB — COMPREHENSIVE METABOLIC PANEL
ALBUMIN: 2.8 g/dL — AB (ref 3.5–5.0)
ALK PHOS: 40 U/L (ref 38–126)
ALT: 25 U/L (ref 17–63)
AST: 30 U/L (ref 15–41)
Anion gap: 10 (ref 5–15)
BILIRUBIN TOTAL: 0.7 mg/dL (ref 0.3–1.2)
BUN: 15 mg/dL (ref 6–20)
CALCIUM: 8.2 mg/dL — AB (ref 8.9–10.3)
CO2: 21 mmol/L — AB (ref 22–32)
Chloride: 110 mmol/L (ref 101–111)
Creatinine, Ser: 1.1 mg/dL (ref 0.61–1.24)
GFR calc non Af Amer: >60 mL/min (ref >60)
GLUCOSE: 80 mg/dL (ref 65–99)
Potassium: 3.9 mmol/L (ref 3.5–5.1)
SODIUM: 141 mmol/L (ref 135–145)
TOTAL PROTEIN: 6.7 g/dL (ref 6.5–8.1)

## 2017-05-20 LAB — LIPASE, BLOOD: Lipase: 48 U/L (ref 11–51)

## 2017-05-20 MED ORDER — SODIUM CHLORIDE 0.9 % IV BOLUS (SEPSIS)
1000.0000 mL | Freq: Once | INTRAVENOUS | Status: AC
  Administered 2017-05-20: 1000 mL via INTRAVENOUS

## 2017-05-20 MED ORDER — ONDANSETRON HCL 4 MG/2ML IJ SOLN
4.0000 mg | Freq: Once | INTRAMUSCULAR | Status: AC
  Administered 2017-05-20: 4 mg via INTRAVENOUS
  Filled 2017-05-20: qty 2

## 2017-05-20 MED ORDER — IOPAMIDOL (ISOVUE-300) INJECTION 61%
INTRAVENOUS | Status: AC
  Administered 2017-05-20: 100 mL
  Filled 2017-05-20: qty 100

## 2017-05-20 MED ORDER — MORPHINE SULFATE (PF) 4 MG/ML IV SOLN
4.0000 mg | Freq: Once | INTRAVENOUS | Status: AC
  Administered 2017-05-20: 4 mg via INTRAVENOUS
  Filled 2017-05-20: qty 1

## 2017-05-20 MED ORDER — OXYCODONE-ACETAMINOPHEN 5-325 MG PO TABS
1.0000 | ORAL_TABLET | Freq: Once | ORAL | Status: AC
  Administered 2017-05-20: 1 via ORAL
  Filled 2017-05-20: qty 1

## 2017-05-20 NOTE — ED Triage Notes (Signed)
Pt left with Family . EDP was told Pt walked out.

## 2017-05-20 NOTE — ED Triage Notes (Signed)
Pt sts generalized abd pain with some vomiting x 2 weeks

## 2017-05-20 NOTE — ED Triage Notes (Signed)
Pt started cussing at this writer upon entering the room to give Pt his pain meds. Pt reported I am leaving . I am sick and tired  Of being here.

## 2017-05-20 NOTE — ED Provider Notes (Signed)
Emergency Department Provider Note   I have reviewed the triage vital signs and the nursing notes.   HISTORY  Chief Complaint Abdominal Pain   HPI [name] is a 56 y.o. male with PMH of HIV and HTN who presents to the emergency department for evaluation of 2 weeks of intermittent right lower quadrant abdominal pain worsening significantly overnight. Patient describes moderate, intermittent pain with no modifying factors. Last night his pain became severe and constant. No radiation. Patient states he's been afraid to eat or drink anything thinking that it may make the pain worse. He states he was taking Bactrim for a boil but stopped this medication when the abdominal pain worsened. He denies any vomiting until last night. He has seen some red in his stool that he thought could've been blood. No black, sticky bowel movements. No fevers or chills. Denies any chest pain or shortness of breath. No history of similar pain in the past.   Past Medical History:  Diagnosis Date  . 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 Candidiasis of mouth   . Chest pain, unspecified   . Diseases of lips    Angular cheilitis  . HIV infection ([place])   . Hypertension   . Lymphoma ([place])   . Personal history of unspecified disease of respiratory system   . Pneumocystosis ([place])   . Shingles   . Tobacco use     Patient Active Problem List   Diagnosis Date Noted  . Tinea cruris 04/23/2017  . Boil of buttock 04/22/2016  . Pain in joint, shoulder region 09/14/2014  . SOB (shortness of breath) 08/19/2013  . Vision changes 05/26/2012  . Tobacco use   . ANGULAR CHEILITIS 11/19/2010  . Chest pain 10/01/2010  . SHINGLES 03/12/2009  . 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 CANDIDIASIS, MOUTH ([place]) 06/07/2007  . Essential hypertension 06/07/2007  . TOBACCO USER 12/30/2006  . PNEUMONIA, HX OF 09/18/2006  . PNEUMOCYSTIS PNEUMONIA 07/13/2003   . Human immunodeficiency virus (HIV) disease ([place]) 06/18/2003    History reviewed. No pertinent surgical history.  Current Outpatient Rx  . Order #: [id number] Class: Normal  . Order #: [id number] Class: Normal  . Order #: [id number] Class: Normal  . Order #: [id number] Class: Print  . Order #: [id number] Class: Print  . Order #: [id number] Class: Print  . Order #: [id number] Class: Normal    Allergies Penicillins  Family History  Problem Relation Age of Onset  . Heart disease Mother   . Diabetes Mother   . Diabetes Sister   . Diabetes Maternal Grandmother     Social History Social History  Substance Use Topics  . Smoking status: Former Smoker    Packs/day: 1.00    Years: 35.00    Quit date: 11/23/2015  . Smokeless tobacco: Never Used     Comment: would like help quitting  . Alcohol use 0.0 oz/week     Comment: beer weekends    Review of Systems  Constitutional: No fever/chills Eyes: No visual changes. ENT: No sore throat. Cardiovascular: Denies chest pain. Respiratory: Denies shortness of breath. Gastrointestinal: Positive RLQ abdominal pain. Positive nausea, vomiting, and diarrhea.  No constipation. Genitourinary: Negative for dysuria. Musculoskeletal: Negative for back pain. Skin: Negative for rash. Neurological: Negative for headaches, focal weakness or numbness.  10-point ROS otherwise negative.  ____________________________________________   PHYSICAL EXAM:  VITAL SIGNS: ED Triage Vitals [05/20/17 1026]  Enc Vitals Group  BP (!) 166/97     Pulse Rate 74     Resp 17     Temp 98.4 F (36.9 C)     Temp Source Oral     SpO2 99 %     Weight 167 lb (75.8 kg)     Height 5\' 5"  (1.651 m)     Pain Score 10   Constitutional: Alert and oriented. Appears uncomfortable.  Eyes: Conjunctivae are normal.  Head: Atraumatic. Nose: No congestion/rhinnorhea. Mouth/Throat: Mucous membranes are moist.  Oropharynx non-erythematous. Neck: No stridor.   Cardiovascular:  Normal rate, regular rhythm. Good peripheral circulation. Grossly normal heart sounds.   Respiratory: Normal respiratory effort.  No retractions. Lungs CTAB. Gastrointestinal: Soft with mild diffuse tenderness to palpation. Focal guarding in the RLQ. No rebound tenderness. No distention.  Musculoskeletal: No lower extremity tenderness nor edema. No gross deformities of extremities. Neurologic:  Normal speech and language. No gross focal neurologic deficits are appreciated.  Skin:  Skin is warm, dry and intact. No rash noted.  ____________________________________________   LABS (all labs ordered are listed, but only abnormal results are displayed)  Labs Reviewed  COMPREHENSIVE METABOLIC PANEL - Abnormal; Notable for the following:       Result Value   CO2 21 (*)    Calcium 8.2 (*)    Albumin 2.8 (*)    All other components within normal limits  CBC - Abnormal; Notable for the following:    Hemoglobin 12.7 (*)    All other components within normal limits  URINALYSIS, ROUTINE W REFLEX MICROSCOPIC - Abnormal; Notable for the following:    Specific Gravity, Urine 1.035 (*)    Hgb urine dipstick LARGE (*)    Protein, ur 100 (*)    Bacteria, UA RARE (*)    Squamous Epithelial / LPF 0-5 (*)    All other components within normal limits  LIPASE, BLOOD   ____________________________________________  RADIOLOGY  Ct Abdomen Pelvis W Contrast  Result Date: 05/20/2017 CLINICAL DATA:  Right side abdominal pain since 05/17/2017. History of HIV. No known injury. EXAM: CT ABDOMEN AND PELVIS WITH CONTRAST TECHNIQUE: Multidetector CT imaging of the abdomen and pelvis was performed using the standard protocol following bolus administration of intravenous contrast. CONTRAST:  100 ml ISOVUE-300 IOPAMIDOL (ISOVUE-300) INJECTION 61% COMPARISON:  CT chest, abdomen and pelvis 11/23/2008. FINDINGS: Lower chest: There is mild dependent atelectasis the lung bases. Lung bases are otherwise clear. No pleural or  pericardial effusion. Heart size is normal. Hepatobiliary: No focal liver abnormality is seen. No gallstones, gallbladder wall thickening, or biliary dilatation. Pancreas: Unremarkable. No pancreatic ductal dilatation or surrounding inflammatory changes. Spleen: Normal in size without focal abnormality. Adrenals/Urinary Tract: Single small cyst in lower pole the right kidney is unchanged. The kidneys otherwise appear normal. Stomach/Bowel: Marked edema and wall thickening of the distal ileum in the central pelvis and right lower quadrant extending to near the terminal ileum are seen. There is no pneumatosis, portal venous gas or free intraperitoneal air. No small bowel obstruction. The stomach and colon appear normal. Trace amount of fluid about the appendix likely related to changes in distal small bowel. No evidence of appendicitis. Vascular/Lymphatic: Scattered aortoiliac atherosclerosis. Small right pelvic sidewall lymph node measures 0.8 cm short axis dimension on axial image 69 is unchanged since 2010. Reproductive: Prostate gland is poorly seen due to streak artifact from bilateral hip replacements. Other: Trace amount of free pelvic fluid is noted. No focal fluid collection. Musculoskeletal: Status post bilateral hip replacement. No  acute bony abnormality. IMPRESSION: Marked wall thickening of the distal ileum is likely due to infectious or inflammatory enteritis. Neoplastic process such as small bowel lymphoma is possible in this immune compromised patient but the appearance is atypical for neoplasm. Electronically Signed   By: Inge Rise M.D.   On: 05/20/2017 12:58    ____________________________________________   PROCEDURES  Procedure(s) performed:   Procedures  None ____________________________________________   INITIAL IMPRESSION / ASSESSMENT AND PLAN / ED COURSE  Pertinent labs & imaging results that were available during my care of the patient were reviewed by me and considered  in my medical decision making (see chart for details).  Patient presents to the emergency department for evaluation of intermittent right lower quadrant abdominal pain worsened significantly overnight. Pain is now constant. He has guarding in the right lower quadrant with moderate tenderness diffusely. No peritoneal signs. Patient does appear uncomfortable. Plan for CT scan abdomen and pelvis.   03:30 PM Made aware by nursing that the patient left the emergency department. There was a delay in getting his lab results back but I updated the patient and wife at bedside regarding the delay and apologized. Upon receiving the Percocet the patient abruptly left and was reportedly reviewed to staff. I'd planned on discharging him with antibiotics and pain medication but patient left prior to receiving this prescription.   Since leaving without abx, follow up instruction, or pain medication the patient has left the ED AMA.  ____________________________________________  FINAL CLINICAL IMPRESSION(S) / ED DIAGNOSES  Final diagnoses:  Right lower quadrant abdominal pain  Infectious enteritis, unspecified infectious agent     MEDICATIONS GIVEN DURING THIS VISIT:  Medications  sodium chloride 0.9 % bolus 1,000 mL (0 mLs Intravenous Stopped 05/20/17 1547)  morphine 4 MG/ML injection 4 mg (4 mg Intravenous Given 05/20/17 1140)  ondansetron (ZOFRAN) injection 4 mg (4 mg Intravenous Given 05/20/17 1140)  iopamidol (ISOVUE-300) 61 % injection (100 mLs  Contrast Given 05/20/17 1215)  oxyCODONE-acetaminophen (PERCOCET/ROXICET) 5-325 MG per tablet 1 tablet (1 tablet Oral Given 05/20/17 1522)     NEW OUTPATIENT MEDICATIONS STARTED DURING THIS VISIT:  None   Note:  This document was prepared using Dragon voice recognition software and may include unintentional dictation errors.  Nanda Quinton, MD Emergency Medicine   Antonela Freiman, [facility], MD 05/21/17 [phone]

## 2017-05-20 NOTE — ED Notes (Signed)
Declined W/C at D/C and was escorted to lobby by RN. Pt refused to wait for DC papers.

## 2017-05-21 ENCOUNTER — Telehealth: Payer: Self-pay | Admitting: Infectious Diseases

## 2017-05-21 DIAGNOSIS — K529 Noninfective gastroenteritis and colitis, unspecified: Secondary | ICD-10-CM

## 2017-05-21 MED ORDER — CIPROFLOXACIN HCL 500 MG PO TABS: 500.0000 mg | ORAL_TABLET | Freq: Two times a day (BID) | ORAL | 0 refills | Status: DC

## 2017-05-21 MED ORDER — CIPROFLOXACIN HCL 500 MG PO TABS: 500.0000 mg | ORAL_TABLET | Freq: Two times a day (BID) | ORAL | 0 refills | Status: AC

## 2017-05-21 NOTE — Addendum Note (Signed)
Addended by: Laverle Patter on: 05/21/2017 11:57 AM   Modules accepted: Orders

## 2017-05-21 NOTE — Telephone Encounter (Signed)
Called pt about his ED visit.  He had ileitis on his CT scan.  He left ED prior to getting rx for anbx.  He did get one dose of narcotic prior to leaving.  He still has pain and I offered him to go back to ED. He refuses this.  We will try to get him into GI Will write him a rx for cipro.

## 2017-05-21 NOTE — Telephone Encounter (Signed)
Medication called to [place], RN

## 2017-07-29 ENCOUNTER — Ambulatory Visit: Payer: Medicare Other | Admitting: Infectious Diseases

## 2017-09-02 DIAGNOSIS — L0292 Furuncle, unspecified: Secondary | ICD-10-CM | POA: Diagnosis not present

## 2017-09-03 DIAGNOSIS — L0292 Furuncle, unspecified: Secondary | ICD-10-CM | POA: Diagnosis not present

## 2017-12-21 ENCOUNTER — Ambulatory Visit: Payer: Medicare Other | Admitting: Infectious Diseases

## 2018-03-09 ENCOUNTER — Telehealth: Payer: Self-pay

## 2018-03-09 NOTE — Telephone Encounter (Signed)
Patient called requesting an appointment with Dr. [name]. He stated he missed his last appointment and wants a new one. This nurse informed him that Dr. [name] next available appointment is the beginning on June. This patient was upset with this information and yelled that he will just come up to the office tomorrow and hung up on the nurse. Pola Corn, LPN

## 2018-04-07 ENCOUNTER — Telehealth: Payer: Self-pay

## 2018-04-07 NOTE — Telephone Encounter (Signed)
Left a vm for pt to call us back to schedule an appt.  [place]

## 2018-04-22 ENCOUNTER — Telehealth: Payer: Self-pay | Admitting: *Deleted

## 2018-04-22 NOTE — Telephone Encounter (Signed)
RN received a return call from [name] stating he would love to take the appt for the 24th and will be sure to be show for his appointment. He stated he really needs to be seen so is happy to come for the appointment.  Appointment made with Dr [name] for 06/24 at 1:45. Informed the patient that we look forward to seeing him.

## 2018-04-22 NOTE — Telephone Encounter (Signed)
Referral received during Viral load suppression meeting at [facility]. Dr.[name]'s order is for me to begin engagement attempts with the patient and offer assistance as needed. Focus should be on addressing the patient's barrier to care and medication adherence.   Called placed today with a message left stating my name and that I would like to reschedule his office visit for the 24th of this month. I asked to please return my call since he(Dr [name]) only has 1 available slot for the 24th but does have availability for the 26th and 27th of June. Left me name and number for the patient to return my call or return via text

## 2018-05-10 ENCOUNTER — Other Ambulatory Visit: Payer: Self-pay

## 2018-05-10 ENCOUNTER — Encounter (HOSPITAL_COMMUNITY): Payer: Self-pay | Admitting: Emergency Medicine

## 2018-05-10 ENCOUNTER — Emergency Department (HOSPITAL_COMMUNITY): Payer: Medicare Other

## 2018-05-10 ENCOUNTER — Inpatient Hospital Stay (HOSPITAL_COMMUNITY)
Admission: EM | Admit: 2018-05-10 | Discharge: 2018-05-26 | DRG: 969 | Disposition: A | Discharge status: Home / Self Care | Payer: Medicare Other | Attending: Family Medicine | Admitting: Family Medicine

## 2018-05-10 ENCOUNTER — Inpatient Hospital Stay (HOSPITAL_COMMUNITY): Payer: Medicare Other

## 2018-05-10 ENCOUNTER — Ambulatory Visit (INDEPENDENT_AMBULATORY_CARE_PROVIDER_SITE_OTHER): Payer: Medicare Other | Admitting: Infectious Diseases

## 2018-05-10 DIAGNOSIS — K921 Melena: Secondary | ICD-10-CM | POA: Diagnosis present

## 2018-05-10 DIAGNOSIS — M25551 Pain in right hip: Secondary | ICD-10-CM

## 2018-05-10 DIAGNOSIS — M25552 Pain in left hip: Secondary | ICD-10-CM | POA: Diagnosis not present

## 2018-05-10 DIAGNOSIS — N189 Chronic kidney disease, unspecified: Secondary | ICD-10-CM | POA: Diagnosis not present

## 2018-05-10 DIAGNOSIS — L0232 Furuncle of buttock: Secondary | ICD-10-CM | POA: Diagnosis present

## 2018-05-10 DIAGNOSIS — R195 Other fecal abnormalities: Secondary | ICD-10-CM | POA: Diagnosis not present

## 2018-05-10 DIAGNOSIS — D638 Anemia in other chronic diseases classified elsewhere: Secondary | ICD-10-CM | POA: Diagnosis present

## 2018-05-10 DIAGNOSIS — I10 Essential (primary) hypertension: Secondary | ICD-10-CM | POA: Diagnosis present

## 2018-05-10 DIAGNOSIS — I25110 Atherosclerotic heart disease of native coronary artery with unstable angina pectoris: Secondary | ICD-10-CM | POA: Diagnosis present

## 2018-05-10 DIAGNOSIS — Z79899 Other long term (current) drug therapy: Secondary | ICD-10-CM

## 2018-05-10 DIAGNOSIS — K922 Gastrointestinal hemorrhage, unspecified: Secondary | ICD-10-CM | POA: Diagnosis not present

## 2018-05-10 DIAGNOSIS — Z720 Tobacco use: Secondary | ICD-10-CM | POA: Diagnosis present

## 2018-05-10 DIAGNOSIS — R109 Unspecified abdominal pain: Secondary | ICD-10-CM | POA: Diagnosis not present

## 2018-05-10 DIAGNOSIS — I5022 Chronic systolic (congestive) heart failure: Secondary | ICD-10-CM | POA: Diagnosis not present

## 2018-05-10 DIAGNOSIS — C8591 Non-Hodgkin lymphoma, unspecified, lymph nodes of head, face, and neck: Secondary | ICD-10-CM | POA: Diagnosis not present

## 2018-05-10 DIAGNOSIS — N2581 Secondary hyperparathyroidism of renal origin: Secondary | ICD-10-CM | POA: Diagnosis present

## 2018-05-10 DIAGNOSIS — F1010 Alcohol abuse, uncomplicated: Secondary | ICD-10-CM | POA: Diagnosis present

## 2018-05-10 DIAGNOSIS — Z4901 Encounter for fitting and adjustment of extracorporeal dialysis catheter: Secondary | ICD-10-CM | POA: Diagnosis not present

## 2018-05-10 DIAGNOSIS — Z7189 Other specified counseling: Secondary | ICD-10-CM

## 2018-05-10 DIAGNOSIS — I160 Hypertensive urgency: Secondary | ICD-10-CM | POA: Diagnosis not present

## 2018-05-10 DIAGNOSIS — Z96643 Presence of artificial hip joint, bilateral: Secondary | ICD-10-CM | POA: Diagnosis present

## 2018-05-10 DIAGNOSIS — E8770 Fluid overload, unspecified: Secondary | ICD-10-CM | POA: Diagnosis not present

## 2018-05-10 DIAGNOSIS — F1910 Other psychoactive substance abuse, uncomplicated: Secondary | ICD-10-CM | POA: Diagnosis not present

## 2018-05-10 DIAGNOSIS — T502X5A Adverse effect of carbonic-anhydrase inhibitors, benzothiadiazides and other diuretics, initial encounter: Secondary | ICD-10-CM | POA: Diagnosis present

## 2018-05-10 DIAGNOSIS — R197 Diarrhea, unspecified: Secondary | ICD-10-CM | POA: Diagnosis not present

## 2018-05-10 DIAGNOSIS — I161 Hypertensive emergency: Secondary | ICD-10-CM | POA: Diagnosis present

## 2018-05-10 DIAGNOSIS — R079 Chest pain, unspecified: Secondary | ICD-10-CM | POA: Diagnosis not present

## 2018-05-10 DIAGNOSIS — I5030 Unspecified diastolic (congestive) heart failure: Secondary | ICD-10-CM | POA: Diagnosis not present

## 2018-05-10 DIAGNOSIS — N179 Acute kidney failure, unspecified: Secondary | ICD-10-CM | POA: Diagnosis not present

## 2018-05-10 DIAGNOSIS — R0602 Shortness of breath: Secondary | ICD-10-CM | POA: Diagnosis not present

## 2018-05-10 DIAGNOSIS — I509 Heart failure, unspecified: Secondary | ICD-10-CM | POA: Diagnosis not present

## 2018-05-10 DIAGNOSIS — E43 Unspecified severe protein-calorie malnutrition: Secondary | ICD-10-CM | POA: Diagnosis present

## 2018-05-10 DIAGNOSIS — E872 Acidosis: Secondary | ICD-10-CM | POA: Diagnosis present

## 2018-05-10 DIAGNOSIS — I5021 Acute systolic (congestive) heart failure: Secondary | ICD-10-CM | POA: Diagnosis not present

## 2018-05-10 DIAGNOSIS — Z6827 Body mass index (BMI) 27.0-27.9, adult: Secondary | ICD-10-CM

## 2018-05-10 DIAGNOSIS — Z9114 Patient's other noncompliance with medication regimen: Secondary | ICD-10-CM

## 2018-05-10 DIAGNOSIS — B356 Tinea cruris: Secondary | ICD-10-CM

## 2018-05-10 DIAGNOSIS — N186 End stage renal disease: Secondary | ICD-10-CM | POA: Diagnosis present

## 2018-05-10 DIAGNOSIS — N185 Chronic kidney disease, stage 5: Secondary | ICD-10-CM | POA: Diagnosis not present

## 2018-05-10 DIAGNOSIS — B20 Human immunodeficiency virus [HIV] disease: Principal | ICD-10-CM | POA: Diagnosis present

## 2018-05-10 DIAGNOSIS — I5041 Acute combined systolic (congestive) and diastolic (congestive) heart failure: Secondary | ICD-10-CM | POA: Diagnosis present

## 2018-05-10 DIAGNOSIS — Z9119 Patient's noncompliance with other medical treatment and regimen: Secondary | ICD-10-CM

## 2018-05-10 DIAGNOSIS — I420 Dilated cardiomyopathy: Secondary | ICD-10-CM | POA: Diagnosis present

## 2018-05-10 DIAGNOSIS — Z923 Personal history of irradiation: Secondary | ICD-10-CM

## 2018-05-10 DIAGNOSIS — I132 Hypertensive heart and chronic kidney disease with heart failure and with stage 5 chronic kidney disease, or end stage renal disease: Secondary | ICD-10-CM | POA: Diagnosis present

## 2018-05-10 DIAGNOSIS — K529 Noninfective gastroenteritis and colitis, unspecified: Secondary | ICD-10-CM | POA: Diagnosis present

## 2018-05-10 DIAGNOSIS — D500 Iron deficiency anemia secondary to blood loss (chronic): Secondary | ICD-10-CM | POA: Diagnosis present

## 2018-05-10 DIAGNOSIS — E876 Hypokalemia: Secondary | ICD-10-CM | POA: Diagnosis not present

## 2018-05-10 DIAGNOSIS — Z01810 Encounter for preprocedural cardiovascular examination: Secondary | ICD-10-CM | POA: Diagnosis not present

## 2018-05-10 DIAGNOSIS — Z515 Encounter for palliative care: Secondary | ICD-10-CM | POA: Diagnosis present

## 2018-05-10 DIAGNOSIS — N171 Acute kidney failure with acute cortical necrosis: Secondary | ICD-10-CM | POA: Diagnosis not present

## 2018-05-10 DIAGNOSIS — D649 Anemia, unspecified: Secondary | ICD-10-CM

## 2018-05-10 DIAGNOSIS — I200 Unstable angina: Secondary | ICD-10-CM | POA: Diagnosis not present

## 2018-05-10 DIAGNOSIS — R1110 Vomiting, unspecified: Secondary | ICD-10-CM | POA: Diagnosis not present

## 2018-05-10 DIAGNOSIS — I129 Hypertensive chronic kidney disease with stage 1 through stage 4 chronic kidney disease, or unspecified chronic kidney disease: Secondary | ICD-10-CM | POA: Diagnosis not present

## 2018-05-10 DIAGNOSIS — K625 Hemorrhage of anus and rectum: Secondary | ICD-10-CM | POA: Diagnosis not present

## 2018-05-10 DIAGNOSIS — Z992 Dependence on renal dialysis: Secondary | ICD-10-CM | POA: Diagnosis present

## 2018-05-10 DIAGNOSIS — R112 Nausea with vomiting, unspecified: Secondary | ICD-10-CM | POA: Diagnosis not present

## 2018-05-10 DIAGNOSIS — D631 Anemia in chronic kidney disease: Secondary | ICD-10-CM | POA: Diagnosis not present

## 2018-05-10 DIAGNOSIS — R0789 Other chest pain: Secondary | ICD-10-CM | POA: Diagnosis not present

## 2018-05-10 DIAGNOSIS — I43 Cardiomyopathy in diseases classified elsewhere: Secondary | ICD-10-CM | POA: Diagnosis present

## 2018-05-10 LAB — COMPREHENSIVE METABOLIC PANEL
ALT: 16 U/L — ABNORMAL LOW (ref 17–63)
ANION GAP: 13 (ref 5–15)
AST: 26 U/L (ref 15–41)
Albumin: 1.5 g/dL — ABNORMAL LOW (ref 3.5–5.0)
Alkaline Phosphatase: 36 U/L — ABNORMAL LOW (ref 38–126)
BUN: 75 mg/dL — ABNORMAL HIGH (ref 6–20)
CHLORIDE: 113 mmol/L — AB (ref 101–111)
CO2: 15 mmol/L — AB (ref 22–32)
Calcium: 7.3 mg/dL — ABNORMAL LOW (ref 8.9–10.3)
Creatinine, Ser: 5.91 mg/dL — ABNORMAL HIGH (ref 0.61–1.24)
GFR calc non Af Amer: 10 mL/min — ABNORMAL LOW (ref >60)
GFR, EST AFRICAN AMERICAN: 11 mL/min — AB (ref >60)
Glucose, Bld: 102 mg/dL — ABNORMAL HIGH (ref 65–99)
POTASSIUM: 3.8 mmol/L (ref 3.5–5.1)
SODIUM: 141 mmol/L (ref 135–145)
Total Bilirubin: 0.5 mg/dL (ref 0.3–1.2)
Total Protein: 6.2 g/dL — ABNORMAL LOW (ref 6.5–8.1)

## 2018-05-10 LAB — I-STAT TROPONIN, ED: Troponin i, poc: 0.11 ng/mL (ref 0.00–0.08)

## 2018-05-10 LAB — PROTIME-INR
INR: 1.04
Prothrombin Time: 13.5 seconds (ref 11.4–15.2)

## 2018-05-10 LAB — TROPONIN I
Troponin I: 0.15 ng/mL (ref <0.03)
Troponin I: 0.18 ng/mL (ref <0.03)

## 2018-05-10 LAB — BASIC METABOLIC PANEL
ANION GAP: 11 (ref 5–15)
BUN: 73 mg/dL — AB (ref 6–20)
CHLORIDE: 117 mmol/L — AB (ref 101–111)
CO2: 15 mmol/L — AB (ref 22–32)
Calcium: 7.5 mg/dL — ABNORMAL LOW (ref 8.9–10.3)
Creatinine, Ser: 5.82 mg/dL — ABNORMAL HIGH (ref 0.61–1.24)
GFR calc Af Amer: 11 mL/min — ABNORMAL LOW (ref >60)
GFR, EST NON AFRICAN AMERICAN: 10 mL/min — AB (ref >60)
GLUCOSE: 106 mg/dL — AB (ref 65–99)
POTASSIUM: 3.8 mmol/L (ref 3.5–5.1)
Sodium: 143 mmol/L (ref 135–145)

## 2018-05-10 LAB — CBC
HEMATOCRIT: 22.9 % — AB (ref 39.0–52.0)
HEMOGLOBIN: 7.2 g/dL — AB (ref 13.0–17.0)
MCH: 26.5 pg (ref 26.0–34.0)
MCHC: 31.4 g/dL (ref 30.0–36.0)
MCV: 84.2 fL (ref 78.0–100.0)
Platelets: 272 10*3/uL (ref 150–400)
RBC: 2.72 MIL/uL — ABNORMAL LOW (ref 4.22–5.81)
RDW: 17.3 % — AB (ref 11.5–15.5)
WBC: 4.8 10*3/uL (ref 4.0–10.5)

## 2018-05-10 LAB — FERRITIN: Ferritin: 1380 ng/mL — ABNORMAL HIGH (ref 24–336)

## 2018-05-10 LAB — IRON AND TIBC
Iron: 83 ug/dL (ref 45–182)
SATURATION RATIOS: 44 % — AB (ref 17.9–39.5)
TIBC: 190 ug/dL — ABNORMAL LOW (ref 250–450)
UIBC: 107 ug/dL

## 2018-05-10 LAB — PREPARE RBC (CROSSMATCH)

## 2018-05-10 LAB — TSH: TSH: 4.213 u[IU]/mL (ref 0.350–4.500)

## 2018-05-10 LAB — RETICULOCYTES
RBC.: 2.69 MIL/uL — AB (ref 4.22–5.81)
RETIC COUNT ABSOLUTE: 35 10*3/uL (ref 19.0–186.0)
RETIC CT PCT: 1.3 % (ref 0.4–3.1)

## 2018-05-10 LAB — VITAMIN B12: Vitamin B-12: 443 pg/mL (ref 180–914)

## 2018-05-10 LAB — POC OCCULT BLOOD, ED: Fecal Occult Bld: POSITIVE — AB

## 2018-05-10 LAB — BRAIN NATRIURETIC PEPTIDE: B Natriuretic Peptide: 788.2 pg/mL — ABNORMAL HIGH (ref 0.0–100.0)

## 2018-05-10 LAB — ETHANOL: Alcohol, Ethyl (B): <10 mg/dL (ref <10)

## 2018-05-10 LAB — ABO/RH: ABO/RH(D): O POS

## 2018-05-10 MED ORDER — HYDRALAZINE HCL 20 MG/ML IJ SOLN
5.0000 mg | Freq: Four times a day (QID) | INTRAMUSCULAR | Status: DC | PRN
  Administered 2018-05-10: 5 mg via INTRAVENOUS
  Filled 2018-05-10: qty 1

## 2018-05-10 MED ORDER — SENNOSIDES-DOCUSATE SODIUM 8.6-50 MG PO TABS
1.0000 | ORAL_TABLET | Freq: Every evening | ORAL | Status: DC | PRN
  Administered 2018-05-17: 1 via ORAL
  Filled 2018-05-10 (×2): qty 1

## 2018-05-10 MED ORDER — LABETALOL HCL 5 MG/ML IV SOLN
10.0000 mg | INTRAVENOUS | Status: DC | PRN
Start: 2018-05-10 — End: 2018-05-26
  Administered 2018-05-10: 10 mg via INTRAVENOUS
  Filled 2018-05-10: qty 4

## 2018-05-10 MED ORDER — BISACODYL 10 MG RE SUPP
10.0000 mg | Freq: Every day | RECTAL | Status: DC | PRN
  Administered 2018-05-18 – 2018-05-26 (×3): 10 mg via RECTAL
  Filled 2018-05-10 (×3): qty 1

## 2018-05-10 MED ORDER — HYDRALAZINE HCL 20 MG/ML IJ SOLN: 10.0000 mg | Freq: Four times a day (QID) | INTRAMUSCULAR | Status: DC | PRN

## 2018-05-10 MED ORDER — SODIUM CHLORIDE 0.9 % IV SOLN
8.0000 mg/h | INTRAVENOUS | Status: DC
  Administered 2018-05-10 – 2018-05-11 (×2): 8 mg/h via INTRAVENOUS
  Filled 2018-05-10: qty 80
  Filled 2018-05-10: qty 40
  Filled 2018-05-10 (×2): qty 80

## 2018-05-10 MED ORDER — SODIUM CHLORIDE 0.9 % IV SOLN
80.0000 mg | Freq: Once | INTRAVENOUS | Status: AC
  Administered 2018-05-10: 80 mg via INTRAVENOUS
  Filled 2018-05-10: qty 80

## 2018-05-10 MED ORDER — ONDANSETRON HCL 4 MG/2ML IJ SOLN
4.0000 mg | Freq: Four times a day (QID) | INTRAMUSCULAR | Status: DC | PRN
  Administered 2018-05-11 – 2018-05-24 (×4): 4 mg via INTRAVENOUS
  Filled 2018-05-10 (×4): qty 2

## 2018-05-10 MED ORDER — NITROGLYCERIN 0.4 MG SL SUBL
SUBLINGUAL_TABLET | SUBLINGUAL | Status: AC
  Administered 2018-05-10: 0.4 mg
  Filled 2018-05-10: qty 1

## 2018-05-10 MED ORDER — DARUNAVIR ETHANOLATE 800 MG PO TABS: 800.0000 mg | ORAL_TABLET | Freq: Every day | ORAL | Status: DC

## 2018-05-10 MED ORDER — ONDANSETRON HCL 4 MG PO TABS
4.0000 mg | ORAL_TABLET | Freq: Four times a day (QID) | ORAL | Status: DC | PRN
  Administered 2018-05-10 – 2018-05-14 (×4): 4 mg via ORAL
  Filled 2018-05-10 (×4): qty 1

## 2018-05-10 MED ORDER — NITROGLYCERIN 0.4 MG SL SUBL
0.4000 mg | SUBLINGUAL_TABLET | SUBLINGUAL | Status: DC | PRN
  Administered 2018-05-10: 0.4 mg via SUBLINGUAL

## 2018-05-10 MED ORDER — ELVITEG-COBIC-EMTRICIT-TENOFAF 150-150-200-10 MG PO TABS: 1.0000 | ORAL_TABLET | Freq: Every day | ORAL | Status: DC

## 2018-05-10 MED ORDER — LABETALOL HCL 5 MG/ML IV SOLN
10.0000 mg | Freq: Once | INTRAVENOUS | Status: AC
  Administered 2018-05-10: 10 mg via INTRAVENOUS
  Filled 2018-05-10: qty 4

## 2018-05-10 MED ORDER — MAGIC MOUTHWASH: 5.0000 mL | Freq: Three times a day (TID) | ORAL | Status: DC | PRN

## 2018-05-10 MED ORDER — SODIUM CHLORIDE 0.9% IV SOLUTION
Freq: Once | INTRAVENOUS | Status: AC
  Administered 2018-05-10: 20:00:00 via INTRAVENOUS

## 2018-05-10 MED ORDER — AMLODIPINE BESYLATE 10 MG PO TABS
10.0000 mg | ORAL_TABLET | Freq: Every day | ORAL | Status: DC
  Administered 2018-05-10 – 2018-05-23 (×14): 10 mg via ORAL
  Filled 2018-05-10 (×10): qty 1
  Filled 2018-05-10: qty 2
  Filled 2018-05-10 (×3): qty 1

## 2018-05-10 MED ORDER — HYDROCODONE-ACETAMINOPHEN 5-325 MG PO TABS
1.0000 | ORAL_TABLET | ORAL | Status: DC | PRN
  Administered 2018-05-11: 1 via ORAL
  Filled 2018-05-10: qty 1

## 2018-05-10 MED ORDER — ACETAMINOPHEN 650 MG RE SUPP: 650.0000 mg | Freq: Four times a day (QID) | RECTAL | Status: DC | PRN

## 2018-05-10 MED ORDER — ACETAMINOPHEN 325 MG PO TABS
650.0000 mg | ORAL_TABLET | Freq: Four times a day (QID) | ORAL | Status: DC | PRN
  Administered 2018-05-10 – 2018-05-14 (×6): 650 mg via ORAL
  Filled 2018-05-10 (×8): qty 2

## 2018-05-10 NOTE — Plan of Care (Signed)
Discussed with patient plan of care, pain management and hospital procedures.  Patient informed on signs and symptoms of chest pain and what to let his nurse know with some teach back displayed

## 2018-05-10 NOTE — ED Provider Notes (Signed)
[facility] EMERGENCY DEPARTMENT Provider Note   CSN: [id number] Arrival date & time: 05/10/18  1407     History   Chief Complaint Chief Complaint  Patient presents with  . Shortness of Breath    HPI [name] is a 57 y.o. male with history of HIV CD4 count 80, detectable viral load, hypertension, remote history of polysubstance and EtOH abuse is here for evaluation of chest pain.  Went to his infectious disease doctor appointment today and developed right-sided chest pain with radiation to the right arm with nausea, shortness of breath after walking from the parking lot to the office.  Chest pain at that time was 7/10, now improving and more mild.  Also noted to be hypertensive in office and sent to the ER for further evaluation.  Patient admits he has been noncompliant with medications for at least 2 years.  He lives in [place] and has not seen infectious disease doctor in several months.  Is not taking any HIV medications.  Has been having intermittent right-sided chest tightness for the last 2 weeks, usually worse with exertion and activity but also happening in the middle of the night and waking him up from his sleep.  Associated symptoms include PND, bilateral lower extremity swelling and heaviness, shortness of breath, nausea, abdominal pain.  Has noticed bright red blood streaks on the toilet paper for the last week.   no alleviating factors.  No interventions PTA.  He denies fevers, chills, cough, hematemesis, dysuria, hematuria, melena.  No previous history of GI bleed. HPI  Past Medical History:  Diagnosis Date  . 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 Candidiasis of mouth   . Chest pain, unspecified   . Diseases of lips    Angular cheilitis  . HIV infection ([place])   . Hypertension   . Lymphoma ([place])   . Personal history of unspecified disease of respiratory system   . 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 Shingles   . Tobacco use      Patient Active Problem List   Diagnosis Date Noted  . CHF (congestive heart failure) ([place]) 05/10/2018  . Hypertensive urgency 05/10/2018  . GIB (gastrointestinal bleeding) 05/10/2018  . Tinea cruris 04/23/2017  . Boil of buttock 04/22/2016  . Pain in joint, shoulder region 09/14/2014  . SOB (shortness of breath) 08/19/2013  . Vision changes 05/26/2012  . Tobacco use   . ANGULAR CHEILITIS 11/19/2010  . Chest pain 10/01/2010  . SHINGLES 03/12/2009  . 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 CANDIDIASIS, MOUTH ([place]) 06/07/2007  . Hypertension 06/07/2007  . TOBACCO USER 12/30/2006  . PNEUMONIA, HX OF 09/18/2006  . PNEUMOCYSTIS PNEUMONIA 07/13/2003  . Human immunodeficiency virus (HIV) disease ([place]) 06/18/2003    History reviewed. No pertinent surgical history.      Home Medications    Prior to Admission medications   Medication Sig Start Date End Date Taking? Authorizing Provider  benazepril (LOTENSIN) 5 MG tablet Take 1 tablet (5 mg total) by mouth daily. Patient not taking: Reported on 04/23/2017 01/19/17   [name], MD  elvitegravir-cobicistat-emtricitabine-tenofovir (GENVOYA) 150-150-200-10 MG TABS tablet Take 1 tablet by mouth daily with breakfast. Patient not taking: Reported on 04/23/2017 01/19/17   [name], MD  fluconazole (DIFLUCAN) 100 MG tablet Take 1 tablet by mouth daily for one week then switched to one tablet each week Patient not taking: Reported  on 05/20/2017 04/23/17   [name], MD  HYDROcodone-acetaminophen (NORCO/VICODIN) 5-325 MG tablet Take 2 tablets by mouth every 4 (four) hours as needed. Patient not taking: Reported on 05/20/2017 04/27/17   Lysbeth Penner, FNP  magic mouthwash SOLN Take 5 mLs by mouth 3 (three) times daily as needed. Patient not taking: Reported on 04/23/2017 04/07/16   [name], MD  senna-docusate (SENOKOT-S) 8.6-50 MG per tablet Take 1 tablet by mouth daily.  05/26/12 05/26/13  [name], MD  sulfamethoxazole-trimethoprim (BACTRIM) 400-80 MG tablet Take 1 tablet by mouth 2 (two) times daily. Patient not taking: Reported on 05/20/2017 04/23/17   [name], MD    Family History Family History  Problem Relation Age of Onset  . Heart disease Mother   . Diabetes Mother   . Diabetes Sister   . Diabetes Maternal Grandmother     Social History Social History   Tobacco Use  . Smoking status: Former Smoker    Packs/day: 1.00    Years: 35.00    Pack years: 35.00    Last attempt to quit: 11/23/2015    Years since quitting: 2.4  . Smokeless tobacco: Never Used  . Tobacco comment: would like help quitting  Substance Use Topics  . Alcohol use: Yes    Alcohol/week: 0.0 oz    Comment: beer weekends  . Drug use: No     Allergies   Penicillins   Review of Systems Review of Systems  Respiratory: Positive for shortness of breath.   Cardiovascular: Positive for chest pain and leg swelling.  Gastrointestinal: Positive for blood in stool and nausea.  Allergic/Immunologic: Positive for immunocompromised state.  All other systems reviewed and are negative.    Physical Exam Updated Vital Signs BP (!) 200/125   Pulse 82   Temp 98.4 F (36.9 C) (Oral)   Resp 17   Ht 5\' 5"  (1.651 m)   Wt 75.8 kg (167 lb)   SpO2 99%   BMI 27.79 kg/m   Physical Exam  Constitutional: He is oriented to person, place, and time. He appears well-developed and well-nourished.  Non toxic.  HENT:  Head: Normocephalic and atraumatic.  Nose: Nose normal.  Moist mucous membranes   Eyes: Pupils are equal, round, and reactive to light. Conjunctivae and EOM are normal.  Neck: Normal range of motion.  Cardiovascular: Normal rate, regular rhythm, normal heart sounds and intact distal pulses.  No murmurs. + JVD. 2+ pitting edema to knees, 1+ up to medial thigh, symmetric. No calf tenderness.   Pulmonary/Chest: Effort normal and breath sounds normal.   Abdominal: Soft. Bowel sounds are normal. There is no tenderness.  No distention. No G/R/R. No suprapubic or CVA tenderness. Negative Murphy's and McBurney's  Genitourinary: Rectal exam shows guaiac positive stool.  Genitourinary Comments: + bright red blood. No melena. Normal sphincter tone. 3 non inflammed nodules to right buttocks with sinus tracts and scarring, non tender without erythema, warmth, drainage.   Musculoskeletal: Normal range of motion.  Neurological: He is alert and oriented to person, place, and time.  Skin: Skin is warm and dry. Capillary refill takes less than 2 seconds.  Psychiatric: He has a normal mood and affect. His behavior is normal. Judgment and thought content normal.  Nursing note and vitals reviewed.    ED Treatments / Results  Labs (all labs ordered are listed, but only abnormal results are displayed) Labs Reviewed  BASIC METABOLIC PANEL - Abnormal; Notable for the following components:  Result Value   Chloride 117 (*)    CO2 15 (*)    Glucose, Bld 106 (*)    BUN 73 (*)    Creatinine, Ser 5.82 (*)    Calcium 7.5 (*)    GFR calc non Af Amer 10 (*)    GFR calc Af Amer 11 (*)    All other components within normal limits  CBC - Abnormal; Notable for the following components:   RBC 2.72 (*)    Hemoglobin 7.2 (*)    HCT 22.9 (*)    RDW 17.3 (*)    All other components within normal limits  BRAIN NATRIURETIC PEPTIDE - Abnormal; Notable for the following components:   B Natriuretic Peptide 788.2 (*)    All other components within normal limits  COMPREHENSIVE METABOLIC PANEL - Abnormal; Notable for the following components:   Chloride 113 (*)    CO2 15 (*)    Glucose, Bld 102 (*)    BUN 75 (*)    Creatinine, Ser 5.91 (*)    Calcium 7.3 (*)    Total Protein 6.2 (*)    Albumin 1.5 (*)    ALT 16 (*)    Alkaline Phosphatase 36 (*)    GFR calc non Af Amer 10 (*)    GFR calc Af Amer 11 (*)    All other components within normal limits  I-STAT  TROPONIN, ED - Abnormal; Notable for the following components:   Troponin i, poc 0.11 (*)    All other components within normal limits  POC OCCULT BLOOD, ED - Abnormal; Notable for the following components:   Fecal Occult Bld POSITIVE (*)    All other components within normal limits  PROTIME-INR  BASIC METABOLIC PANEL  CBC  TSH  TROPONIN I  TROPONIN I  TROPONIN I  RAPID URINE DRUG SCREEN, HOSP PERFORMED  ETHANOL  HEMOGLOBIN A1C  LIPID PANEL  VITAMIN B12  FOLATE  IRON AND TIBC  FERRITIN  RETICULOCYTES  TYPE AND SCREEN  ABO/RH    EKG EKG Interpretation  Date/Time:  Monday May 10 2018 14:19:45 EDT Ventricular Rate:  82 PR Interval:  148 QRS Duration: 86 QT Interval:  360 QTC Calculation: 420 R Axis:   13 Text Interpretation:  Normal sinus rhythm Nonspecific ST and T wave abnormality Abnormal ECG No significant change since last tracing Confirmed by Isla Pence [id number]) on 05/10/2018 3:45:59 PM   Radiology Dg Chest 2 View  Result Date: 05/10/2018 CLINICAL DATA:  Short of breath EXAM: CHEST - 2 VIEW COMPARISON:  12/26/2009 FINDINGS: The heart size and mediastinal contours are within normal limits. Both lungs are clear. The visualized skeletal structures are unremarkable. IMPRESSION: No active cardiopulmonary disease. Electronically Signed   By: Franchot Gallo M.D.   On: 05/10/2018 15:24    Procedures .Critical Care Performed by: Kinnie Feil, PA-C Authorized by: Kinnie Feil, PA-C   Critical care provider statement:    Critical care time (minutes):  35   Critical care was necessary to treat or prevent imminent or life-threatening deterioration of the following conditions:  Renal failure (hypertensive emergency )   Critical care was time spent personally by me on the following activities:  Discussions with consultants, evaluation of patient's response to treatment, examination of patient, re-evaluation of patient's condition, review of old charts,  ordering and review of radiographic studies and ordering and review of laboratory studies   I assumed direction of critical care for this patient from another provider in my  specialty: no     (including critical care time)  Medications Ordered in ED Medications  pantoprazole (PROTONIX) 80 mg in sodium chloride 0.9 % 100 mL IVPB (80 mg Intravenous New Bag/Given 05/10/18 1842)  pantoprazole (PROTONIX) 80 mg in sodium chloride 0.9 % 250 mL (0.32 mg/mL) infusion (8 mg/hr Intravenous New Bag/Given 05/10/18 1842)  magic mouthwash (has no administration in time range)  elvitegravir-cobicistat-emtricitabine-tenofovir (GENVOYA) 150-150-200-10 MG tablet 1 tablet (has no administration in time range)  darunavir (PREZISTA) tablet 800 mg (has no administration in time range)  acetaminophen (TYLENOL) tablet 650 mg (has no administration in time range)    Or  acetaminophen (TYLENOL) suppository 650 mg (has no administration in time range)  HYDROcodone-acetaminophen (NORCO/VICODIN) 5-325 MG per tablet 1-2 tablet (has no administration in time range)  senna-docusate (Senokot-S) tablet 1 tablet (has no administration in time range)  bisacodyl (DULCOLAX) suppository 10 mg (has no administration in time range)  ondansetron (ZOFRAN) tablet 4 mg (has no administration in time range)    Or  ondansetron (ZOFRAN) injection 4 mg (has no administration in time range)  hydrALAZINE (APRESOLINE) injection 5 mg (has no administration in time range)  labetalol (NORMODYNE,TRANDATE) injection 10 mg (10 mg Intravenous Given 05/10/18 1706)     Initial Impression / Assessment and Plan / ED Course  I have reviewed the triage vital signs and the nursing notes.  Pertinent labs & imaging results that were available during my care of the patient were reviewed by me and considered in my medical decision making (see chart for details).  Clinical Course as of May 10 1901  Mon May 10, 2018  1544 Hemoglobin(!): 7.2 [CG]  1544  CO2(!): 15 [CG]  1545 BUN(!): 73 [CG]  1545 Creatinine(!): 5.82 [CG]  1545 GFR, Est African American(!): 11 [CG]  1545 Troponin i, poc(!!): 0.11 [CG]  1548 Went to see pt, not in room yet    [CG]  1714 Fecal Occult Blood, POC(!): POSITIVE [CG]  1900 Spoke to cardiologist who reviewed patient's chart, recommending medical management of BP with labetalol/hydralazine, echo, trend troponin and reconsult in AM. Not considering cath at this time due to hgb and creatinine.    [CG]    Clinical Course User Index [CG] Kinnie Feil, PA-C   Concern for ACS/angina vs new onset CHF.    One exam he has pitting edema symmetric bilaterally and hypertensive. CP has resolved.   Labs remarkable for hgb 7.2 and positive hemoccult with bright red blood on exam. Creatinine 5.82, BUN 73, GFR 11. Trop 0.11. CXR and EKG unremarkable.   Will consult unassigned for admission for GI bleed, new onset anemia, AKI likely secondary to untreated HTN, hypertensive emergency control. Labetalol and protonix ordered. Pt shared with Dr Gilford Raid.  BNP pending.   1755: spoke to hospitalist who will admit patient, requesting cardiology and GI consult.   1820: Spoke to GI recommends protonix BID, no drip. Will see pt in AM. No further recommendations tonight.   1900: Spoke to cardiologist. See above for recommendations.  Final Clinical Impressions(s) / ED Diagnoses   Final diagnoses:  Human immunodeficiency virus (HIV) disease [facility])    ED Discharge Orders    None       Arlean Hopping 05/10/18 Elvera Maria, MD 05/10/18 2256

## 2018-05-10 NOTE — ED Notes (Addendum)
Patient to ED c/o SOB on exertion - denies CP, no dizziness/lightheadedness. Resp e/u at rest, skin warm/dry. Patient endorses intermittent right-sided CP that radiated to R arm as well, but states it has since resolved.

## 2018-05-10 NOTE — H&P (Addendum)
History and Physical    KELLIS MCADAM AVW:[id number] DOB: 1961/11/02 DOA: 05/10/2018   PCP: [name], MD   Patient coming from:  Home    Chief Complaint: Chest pain, shortness of breath, GI bleed  HPI: [name] is a 57 y.o. male with medical history significant for HIV with CD4 count of 80, detectable viral load, hypertension, remote history of polysubstance abuse and alcohol abuse, sent from his infectious disease doctor after an appointment today, when he developed  right-sided chest pain, with radiation to the right arm and nausea, shortness of breath after walking short distance to the parking lot at the office.  Of note, the right chest pain has been present for about 2 weeks, and is intermittent, at times waking him up from sleep.  At the time, the chest pain was 7 out of 10, but on presentation he was about 4 out of 10.  He also reports bilateral lower extremity swelling, and paroxysmal nocturnal dyspnea, shortness of breath, . He was also noted to be hypertensive at the office, and sent to the ER for further evaluation.  He has been noncompliant with the medication over the last 2 years.  The patient lives in [facility] [facility], and has not been seen by ID in several months, and she is not taking any HIV medications.  In addition, the patient reports bright red blood per rectum over the last week, without alleviating factors.  The patient denies any prior history of GI bleed.   ED Course:  BP (!) 206/125 (BP Location: Left Arm)   Pulse 83   Temp 98.4 F (36.9 C) (Oral)   Resp 17   Ht 5\' 5"  (1.651 m)   Wt 75.8 kg (167 lb)   SpO2 100%   BMI 27.79 kg/m    Chloride 117, bicarb 15, glucose 106, BUN 73, creatinine 5.82, calcium 7.5, hemoglobin 7.2, troponin 0 0.11 Hemoccult positive EKG normal sinus rhythm nonspecific ST and T wave abnormality, but without significant changes since her last tracing.   chest x-ray without active cardiopulmonary disease Received Protonix IV 80  mg x 2, and for his blood pressure received Normodyne 10 mg   Review of Systems:  As per HPI otherwise all other systems reviewed and are negative  Past Medical History:  Diagnosis Date  . 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 Candidiasis of mouth   . Chest pain, unspecified   . Diseases of lips    Angular cheilitis  . HIV infection ([place])   . Hypertension   . Lymphoma ([place])   . Personal history of unspecified disease of respiratory system   . 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 Shingles   . Tobacco use     History reviewed. No pertinent surgical history.  Social History Social History   Socioeconomic History  . Marital status: Single    Spouse name: Not on file  . Number of children: Not on file  . Years of education: Not on file  . Highest education level: Not on file  Occupational History  . Not on file  Social Needs  . Financial resource strain: Not on file  . Food insecurity:    Worry: Not on file    Inability: Not on file  . Transportation needs:    Medical: Not on file    Non-medical: Not on file  Tobacco Use  . Smoking status: Former Smoker    Packs/day: 1.00  Years: 35.00    Pack years: 35.00    Last attempt to quit: 11/23/2015    Years since quitting: 2.4  . Smokeless tobacco: Never Used  . Tobacco comment: would like help quitting  Substance and Sexual Activity  . Alcohol use: Yes    Alcohol/week: 0.0 oz    Comment: beer weekends  . Drug use: No  . Sexual activity: Yes    Partners: Female    Comment: pt. declined condoms  Lifestyle  . Physical activity:    Days per week: Not on file    Minutes per session: Not on file  . Stress: Not on file  Relationships  . Social connections:    Talks on phone: Not on file    Gets together: Not on file    Attends religious service: Not on file    Active member of club or organization: Not on file    Attends meetings of clubs or organizations: Not on file    Relationship status: Not on file   . Intimate partner violence:    Fear of current or ex partner: Not on file    Emotionally abused: Not on file    Physically abused: Not on file    Forced sexual activity: Not on file  Other Topics Concern  . Not on file  Social History Narrative  . Not on file     Allergies  Allergen Reactions  . Penicillins Anaphylaxis    Has patient had a PCN reaction causing immediate rash, facial/tongue/throat swelling, SOB or lightheadedness with hypotension: Unknown Has patient had a PCN reaction causing severe rash involving mucus membranes or skin necrosis: Unknown Has patient had a PCN reaction that required hospitalization: Unknown Has patient had a PCN reaction occurring within the last 10 years: No If all of the above answers are "NO", then may proceed with Cephalosporin use.    Family History  Problem Relation Age of Onset  . Heart disease Mother   . Diabetes Mother   . Diabetes Sister   . Diabetes Maternal Grandmother        Prior to Admission medications   Medication Sig Start Date End Date Taking? Authorizing Provider  benazepril (LOTENSIN) 5 MG tablet Take 1 tablet (5 mg total) by mouth daily. Patient not taking: Reported on 04/23/2017 01/19/17   [name], MD  elvitegravir-cobicistat-emtricitabine-tenofovir (GENVOYA) 150-150-200-10 MG TABS tablet Take 1 tablet by mouth daily with breakfast. Patient not taking: Reported on 04/23/2017 01/19/17   [name], MD  fluconazole (DIFLUCAN) 100 MG tablet Take 1 tablet by mouth daily for one week then switched to one tablet each week Patient not taking: Reported on 05/20/2017 04/23/17   [name], MD  HYDROcodone-acetaminophen (NORCO/VICODIN) 5-325 MG tablet Take 2 tablets by mouth every 4 (four) hours as needed. Patient not taking: Reported on 05/20/2017 04/27/17   Lysbeth Penner, FNP  magic mouthwash SOLN Take 5 mLs by mouth 3 (three) times daily as needed. Patient not taking: Reported on 04/23/2017 04/07/16   [name], MD  senna-docusate (SENOKOT-S) 8.6-50 MG per tablet Take 1 tablet by mouth daily. 05/26/12 05/26/13  [name], MD  sulfamethoxazole-trimethoprim (BACTRIM) 400-80 MG tablet Take 1 tablet by mouth 2 (two) times daily. Patient not taking: Reported on 05/20/2017 04/23/17   [name], MD     Physical Exam:  Vitals:   05/10/18 1421 05/10/18 1605 05/10/18 1708 05/10/18 1752  BP:  (!) 229/122 (!) 191/116 (!) 206/125  Pulse:  79 84 83  Resp:  17 15 17   Temp:      TempSrc:      SpO2:  100% 100% 100%  Weight: 75.8 kg (167 lb)     Height: 5\' 5"  (1.651 m)      Constitutional: NAD, calm, nontoxic-appearing Eyes: PERRL, lids and conjunctivae normal ENMT: Mucous membranes are moist, without exudate or lesions  Neck: JVD noted, supple, no masses, no thyromegaly Respiratory: clear to auscultation bilaterally, no wheezing, no crackles. Normal respiratory effort  Cardiovascular: Regular rate and rhythm,  murmur, rubs or gallops.  Anasarca noted, with significant lower extremity edema around 2+. 2+ pedal pulses. No carotid bruits.  Abdomen: Soft, non tender, No hepatosplenomegaly. Bowel sounds positive.  EDP bright red blood was noted in rectal exam. Musculoskeletal: no clubbing / cyanosis. Moves all extremities Skin: no jaundice, No lesions.  Neurologic: Sensation intact  Strength equal in all extremities Psychiatric:   Alert and oriented x 3. Normal mood.     Labs on Admission: I have personally reviewed following labs and imaging studies  CBC: Recent Labs  Lab 05/10/18 1424  WBC 4.8  HGB 7.2*  HCT 22.9*  MCV 84.2  PLT 161    Basic Metabolic Panel: Recent Labs  Lab 05/10/18 1424  NA 143  K 3.8  CL 117*  CO2 15*  GLUCOSE 106*  BUN 73*  CREATININE 5.82*  CALCIUM 7.5*    GFR: Estimated Creatinine Clearance: 13.5 mL/min (A) (by C-G formula based on SCr of 5.82 mg/dL (H)).  Liver Function Tests: No results for input(s): AST, ALT, ALKPHOS, BILITOT, PROT,  ALBUMIN in the last 168 hours. No results for input(s): LIPASE, AMYLASE in the last 168 hours. No results for input(s): AMMONIA in the last 168 hours.  Coagulation Profile: No results for input(s): INR, PROTIME in the last 168 hours.  Cardiac Enzymes: No results for input(s): CKTOTAL, CKMB, CKMBINDEX, TROPONINI in the last 168 hours.  BNP (last 3 results) No results for input(s): PROBNP in the last 8760 hours.  HbA1C: No results for input(s): HGBA1C in the last 72 hours.  CBG: No results for input(s): GLUCAP in the last 168 hours.  Lipid Profile: No results for input(s): CHOL, HDL, LDLCALC, TRIG, CHOLHDL, LDLDIRECT in the last 72 hours.  Thyroid Function Tests: No results for input(s): TSH, T4TOTAL, FREET4, T3FREE, THYROIDAB in the last 72 hours.  Anemia Panel: No results for input(s): VITAMINB12, FOLATE, FERRITIN, TIBC, IRON, RETICCTPCT in the last 72 hours.  Urine analysis:    Component Value Date/Time   COLORURINE YELLOW 05/20/2017 1314   APPEARANCEUR CLEAR 05/20/2017 1314   LABSPEC 1.035 (H) 05/20/2017 1314   PHURINE 6.0 05/20/2017 1314   GLUCOSEU NEGATIVE 05/20/2017 1314   GLUCOSEU NEG mg/dL 11/23/2006 2036   HGBUR LARGE (A) 05/20/2017 1314   BILIRUBINUR NEGATIVE 05/20/2017 1314   KETONESUR NEGATIVE 05/20/2017 1314   PROTEINUR 100 (A) 05/20/2017 1314   UROBILINOGEN 0.2 11/23/2006 2036   NITRITE NEGATIVE 05/20/2017 1314   LEUKOCYTESUR NEGATIVE 05/20/2017 1314    Sepsis Labs: @LABRCNTIP (procalcitonin:4,lacticidven:4) )No results found for this or any previous visit (from the past 240 hour(s)).   Radiological Exams on Admission: Dg Chest 2 View  Result Date: 05/10/2018 CLINICAL DATA:  Short of breath EXAM: CHEST - 2 VIEW COMPARISON:  12/26/2009 FINDINGS: The heart size and mediastinal contours are within normal limits. Both lungs are clear. The visualized skeletal structures are unremarkable. IMPRESSION: No active cardiopulmonary disease. Electronically Signed    By: Juanda Crumble  Carlis Abbott M.D.   On: 05/10/2018 15:24    EKG: Independently reviewed.  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  Patient has never been seen by GI.  He has received Protonix 80 mg the ER, and has been placed on Protonix drip.  Patient denies any abdominal pain at this time, but does have some nausea, no vomiting.   Admit to stepdown telemetry Cycle CBC every 6 hours  Check PT/INR/PTT Type and screen complete  NPO for now, in case that the patient will need intervention such as endoscopy normal saline IV fluid Continue Protonix drip as per EDP and GI Hold all NSAIDs GI consult is appreciated   Anemia of GI bleed as above, hemoglobin on admission 5.  Hemoccult is positive.  He denies any history of anemia, or sickle cell disease. Appreciate GI evaluation Serial CBC Transfuse as indicated Check anemia panel   Chest pain syndrome, this is on the right side, troponin 0 0.11.  EKG without evidence of acute changes.  The patient never had an echocardiogram, or has any history of cardiac disease.  BNP is over 700.  Likely, this is related to severe anemia, and CHF.  Neurology evaluation pending.  Chest x-ray unrevealing Stepdown telemetry Cycle troponins  EKG in am O2 and NTG as needed Statins  Check Lipid panel  Hb A1C 2 D echo today consult to Cards  Severe Acute Kidney Injury, unclear etiology.  Current value 5.82.Marland Kitchen  Patient is making urine, frequently Lab Results  Component Value Date   CREATININE 5.82 (H) 05/10/2018   CREATININE 1.10 05/20/2017   CREATININE 0.67 (L) 04/23/2017  No IV fluids for now, Serial BMET     Hypertensive  urgency-Patient presenting with severe chest pain and SOB concerning for hypertensive urgency.  Has received normal line at the ER, with some response. Will start hydralazine IV every 6 hours as needed for blood pressure greater than 180 Patient will need to establish primary care evaluation and as outpatient, for secondary hypertension Obtain care management consultation  Acute CHF / possible new diagnosis, with severe peripheral edema which is symmetrical, without calf tenderness.  Patient never had an echocardiogram.  BNP is over 700.  CX-ray without acute pulmonary disease.  Unable to diurese, due to severe acute kidney injury. Appreciate cardiology evaluation monitor I/Os and daily weights prn 02  HIV, with last RNA quantitative of 80,500 on 04/23/2017, and RNA quant log 4.91, the patient has not been seen for 1 year by ID, and was not on antiretrovirals. Patient was supposed to see Dr. [name] today ID but had to be admitted to the hospital for all of the above. He will need ID consultation once in the hospital, after stabilizing the above issues.  DVT prophylaxis: SCD Code Status:    Full Family Communication:  Discussed with patient Disposition Plan: Expect patient to be discharged to home after condition improves Consults called:    Cardiology and GI.  EDP Admission status: Stepdown     Sharene Butters, PA-C [facility]   Amion text  [phone]   05/10/2018, 5:55 PM   Agree with the assessment and plan as outlined by advanced practitioner.  Hypertensive emergency with AKI. Monitor u/o. Hg 7.2-transfuse 2U due to symptomatic anemia. Fobt +. Consult GI in the am.

## 2018-05-10 NOTE — Progress Notes (Signed)
Pt comes into clinic diaphoretic.  C/o 7/10 R CP.  He states he has edema so severe in his legs he can't put his pants on.  His BP is 241/142 He has > 3+ LE edema.  He is sent to ED.

## 2018-05-10 NOTE — ED Notes (Signed)
Called lab added on BNP

## 2018-05-10 NOTE — ED Provider Notes (Signed)
3:15 pm went to room to see patient and he was not there. Had gone to x-ray.   Debroah Baller [place], [place] 05/10/18 1543    Julianne Rice, MD 05/11/18 [phone]

## 2018-05-11 ENCOUNTER — Encounter (HOSPITAL_COMMUNITY): Payer: Self-pay | Admitting: Cardiology

## 2018-05-11 ENCOUNTER — Inpatient Hospital Stay (HOSPITAL_COMMUNITY): Payer: Medicare Other

## 2018-05-11 DIAGNOSIS — I509 Heart failure, unspecified: Secondary | ICD-10-CM

## 2018-05-11 DIAGNOSIS — K625 Hemorrhage of anus and rectum: Secondary | ICD-10-CM

## 2018-05-11 DIAGNOSIS — I160 Hypertensive urgency: Secondary | ICD-10-CM

## 2018-05-11 DIAGNOSIS — Z720 Tobacco use: Secondary | ICD-10-CM

## 2018-05-11 DIAGNOSIS — R195 Other fecal abnormalities: Secondary | ICD-10-CM

## 2018-05-11 DIAGNOSIS — D649 Anemia, unspecified: Secondary | ICD-10-CM

## 2018-05-11 DIAGNOSIS — R079 Chest pain, unspecified: Secondary | ICD-10-CM

## 2018-05-11 DIAGNOSIS — I10 Essential (primary) hypertension: Secondary | ICD-10-CM

## 2018-05-11 DIAGNOSIS — B20 Human immunodeficiency virus [HIV] disease: Principal | ICD-10-CM

## 2018-05-11 DIAGNOSIS — N179 Acute kidney failure, unspecified: Secondary | ICD-10-CM

## 2018-05-11 DIAGNOSIS — I5030 Unspecified diastolic (congestive) heart failure: Secondary | ICD-10-CM

## 2018-05-11 LAB — URINALYSIS, ROUTINE W REFLEX MICROSCOPIC
BILIRUBIN URINE: NEGATIVE
Bacteria, UA: NONE SEEN
Glucose, UA: NEGATIVE mg/dL
Ketones, ur: NEGATIVE mg/dL
Leukocytes, UA: NEGATIVE
NITRITE: NEGATIVE
PH: 5 (ref 5.0–8.0)
Protein, ur: >=300 mg/dL — AB
SPECIFIC GRAVITY, URINE: 1.012 (ref 1.005–1.030)

## 2018-05-11 LAB — NA AND K (SODIUM & POTASSIUM), RAND UR
POTASSIUM UR: 15 mmol/L
Sodium, Ur: 71 mmol/L

## 2018-05-11 LAB — RENAL FUNCTION PANEL
ALBUMIN: 1.8 g/dL — AB (ref 3.5–5.0)
Anion gap: 11 (ref 5–15)
BUN: 71 mg/dL — ABNORMAL HIGH (ref 6–20)
CALCIUM: 7.5 mg/dL — AB (ref 8.9–10.3)
CO2: 13 mmol/L — ABNORMAL LOW (ref 22–32)
Chloride: 117 mmol/L — ABNORMAL HIGH (ref 98–111)
Creatinine, Ser: 5.92 mg/dL — ABNORMAL HIGH (ref 0.61–1.24)
GFR calc non Af Amer: 10 mL/min — ABNORMAL LOW (ref >60)
GFR, EST AFRICAN AMERICAN: 11 mL/min — AB (ref >60)
Glucose, Bld: 137 mg/dL — ABNORMAL HIGH (ref 70–99)
PHOSPHORUS: 7.2 mg/dL — AB (ref 2.5–4.6)
Potassium: 3.9 mmol/L (ref 3.5–5.1)
SODIUM: 141 mmol/L (ref 135–145)

## 2018-05-11 LAB — RAPID URINE DRUG SCREEN, HOSP PERFORMED
Amphetamines: NOT DETECTED
BENZODIAZEPINES: NOT DETECTED
Cocaine: NOT DETECTED
Opiates: NOT DETECTED
Tetrahydrocannabinol: POSITIVE — AB

## 2018-05-11 LAB — ECHOCARDIOGRAM COMPLETE
Height: 65 in
Weight: 2984.15 oz

## 2018-05-11 LAB — MRSA PCR SCREENING: MRSA BY PCR: NEGATIVE

## 2018-05-11 MED ORDER — ALBUMIN HUMAN 25 % IV SOLN
25.0000 g | Freq: Four times a day (QID) | INTRAVENOUS | Status: AC
  Administered 2018-05-11: 25 g via INTRAVENOUS
  Administered 2018-05-11 – 2018-05-12 (×2): 12.5 g via INTRAVENOUS
  Filled 2018-05-11 (×4): qty 50

## 2018-05-11 MED ORDER — ISOSORB DINITRATE-HYDRALAZINE 20-37.5 MG PO TABS
1.0000 | ORAL_TABLET | Freq: Three times a day (TID) | ORAL | Status: DC
  Administered 2018-05-11 – 2018-05-23 (×37): 1 via ORAL
  Filled 2018-05-11 (×39): qty 1

## 2018-05-11 MED ORDER — SULFAMETHOXAZOLE-TRIMETHOPRIM 400-80 MG PO TABS
1.0000 | ORAL_TABLET | Freq: Every day | ORAL | Status: DC
  Administered 2018-05-11: 1 via ORAL
  Filled 2018-05-11: qty 1

## 2018-05-11 MED ORDER — SODIUM BICARBONATE 650 MG PO TABS
1300.0000 mg | ORAL_TABLET | Freq: Two times a day (BID) | ORAL | Status: DC
  Administered 2018-05-11 – 2018-05-23 (×26): 1300 mg via ORAL
  Filled 2018-05-11 (×26): qty 2

## 2018-05-11 MED ORDER — DOLUTEGRAVIR SODIUM 50 MG PO TABS
50.0000 mg | ORAL_TABLET | Freq: Every day | ORAL | Status: DC
  Filled 2018-05-11: qty 1

## 2018-05-11 MED ORDER — AZITHROMYCIN 600 MG PO TABS
1200.0000 mg | ORAL_TABLET | ORAL | Status: DC
  Administered 2018-05-11 – 2018-05-25 (×3): 1200 mg via ORAL
  Filled 2018-05-11 (×3): qty 2

## 2018-05-11 MED ORDER — HYDROCODONE-ACETAMINOPHEN 5-325 MG PO TABS
1.0000 | ORAL_TABLET | ORAL | Status: DC | PRN
  Administered 2018-05-11 – 2018-05-17 (×4): 1 via ORAL
  Administered 2018-05-18: 2 via ORAL
  Administered 2018-05-18 – 2018-05-21 (×4): 1 via ORAL
  Filled 2018-05-11 (×5): qty 1
  Filled 2018-05-11: qty 2
  Filled 2018-05-11 (×3): qty 1

## 2018-05-11 MED ORDER — DARBEPOETIN ALFA 40 MCG/0.4ML IJ SOSY
40.0000 ug | PREFILLED_SYRINGE | Freq: Once | INTRAMUSCULAR | Status: AC
  Administered 2018-05-11: 40 ug via SUBCUTANEOUS
  Filled 2018-05-11: qty 0.4

## 2018-05-11 MED ORDER — FUROSEMIDE 10 MG/ML IJ SOLN
40.0000 mg | Freq: Two times a day (BID) | INTRAMUSCULAR | Status: DC
  Administered 2018-05-11 – 2018-05-23 (×25): 40 mg via INTRAVENOUS
  Filled 2018-05-11 (×25): qty 4

## 2018-05-11 MED ORDER — DARUNAVIR-COBICISTAT 800-150 MG PO TABS
1.0000 | ORAL_TABLET | Freq: Every day | ORAL | Status: DC
  Filled 2018-05-11: qty 1

## 2018-05-11 MED ORDER — SENNOSIDES-DOCUSATE SODIUM 8.6-50 MG PO TABS
1.0000 | ORAL_TABLET | Freq: Every day | ORAL | Status: DC
  Administered 2018-05-11 – 2018-05-26 (×14): 1 via ORAL
  Filled 2018-05-11 (×16): qty 1

## 2018-05-11 MED ORDER — TRAMADOL HCL 50 MG PO TABS
50.0000 mg | ORAL_TABLET | Freq: Two times a day (BID) | ORAL | Status: DC | PRN
  Administered 2018-05-21: 50 mg via ORAL
  Filled 2018-05-11 (×2): qty 1

## 2018-05-11 MED ORDER — CARVEDILOL 12.5 MG PO TABS
12.5000 mg | ORAL_TABLET | Freq: Two times a day (BID) | ORAL | Status: DC
  Administered 2018-05-11 – 2018-05-15 (×6): 12.5 mg via ORAL
  Filled 2018-05-11 (×8): qty 1

## 2018-05-11 MED ORDER — RILPIVIRINE HCL 25 MG PO TABS
25.0000 mg | ORAL_TABLET | Freq: Every day | ORAL | Status: DC
  Filled 2018-05-11: qty 1

## 2018-05-11 NOTE — Consult Note (Addendum)
[facility] Consult: 6:44 PM 05/11/2018  LOS: 1 day    Referring Provider: Dr. Thereasa Solo Primary Care Physician:  [name], MD Primary Gastroenterologist:  unassigned     Reason for Consultation: Bleeding per rectum.                                                         HPI: [name] is a 57 y.o. male.   Lives in [place] but comes to [place] when he wants medical care.  Hx  HIV, noncompliant with meds.  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  Hypertension.  Remote polysubstance and alcohol abuse.  Hip AVN, s/p bil THR.   CT abdomen pelvis with contrast in 05/2017 for abdominal pain showed marked wall thickening at the distal ileum, likely infectious or inflammatory enteritis.  Neoplastic process, such as lymphoma, possible in immune compromised patient. Sent for admission from outpatient appointment with Dr. [name] yesterday.  Patient was complaining of 2 weeks, intermittent, right-sided chest pain, lower extremity edema PND, dyspnea.  He was hypertensive in the office.   For a year or more patient has had boils, skin eruptions right inner buttocks, near the rectum.  These drain pus and blood regularly.  He has seen blood when he wipes, this is not associated with bowel movements. Excellent appetite.  He moves brown stools once or twice a day.  No abdominal pain.  No dysphagia.  Right lower extremity edema, has not had any increased abdominal girth or scrotal edema.  BP 241/142.  . Nonspecific ST and T wave abnormality, unchanged from previous. Hgb 7.2, was 12.7 in 7/19.  MCV 84.  Platelets and PT/INR normal. Iron normal, ferritin 1380.  Low TIBC, elevated iron saturation. FOBT positive. Troponin I 0.11, 0.15, 0.18.   BNP 788. AKI with BUN/creatinine 75/5.9.  GFR  11. Renal ultrasound shows medical renal disease. CXR: No active cardiopulmonary disease. 2D echo completed, results pending.  Has not drunk alcohol or use illicit drugs for 10 or 12 years. Family history negative for colon cancer, GI bleeding, anemia. Tells me the reason why he is noncompliant with medication states he cannot afford them.    Past Medical History:  Diagnosis Date  . 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 Candidiasis of mouth   . Chest pain, unspecified   . Diseases of lips    Angular cheilitis  . HIV infection ([place])   . Hypertension   . Lymphoma ([place])   . Personal history of unspecified disease of respiratory system   . Pneumocystosis ([place])   . Shingles   . Tobacco use     History reviewed. No pertinent surgical history.  Prior to Admission medications   Medication Sig Start Date End Date Taking? Authorizing Provider  benazepril (LOTENSIN) 5 MG tablet Take 1 tablet (  5 mg total) by mouth daily. Patient not taking: Reported on 04/23/2017 01/19/17   [name], MD  elvitegravir-cobicistat-emtricitabine-tenofovir (GENVOYA) 150-150-200-10 MG TABS tablet Take 1 tablet by mouth daily with breakfast. Patient not taking: Reported on 04/23/2017 01/19/17   [name], MD  fluconazole (DIFLUCAN) 100 MG tablet Take 1 tablet by mouth daily for one week then switched to one tablet each week Patient not taking: Reported on 05/20/2017 04/23/17   [name], MD  magic mouthwash SOLN Take 5 mLs by mouth 3 (three) times daily as needed. Patient not taking: Reported on 04/23/2017 04/07/16   [name], MD  senna-docusate (SENOKOT-S) 8.6-50 MG per tablet Take 1 tablet by mouth daily. 05/26/12 05/26/13  [name], MD  sulfamethoxazole-trimethoprim (BACTRIM) 400-80 MG tablet Take 1 tablet by mouth 2 (two) times daily. Patient not taking: Reported on 05/20/2017 04/23/17   [name], MD    Scheduled Meds: . amLODipine  10 mg Oral  Daily  . azithromycin  1,200 mg Oral Weekly  . carvedilol  12.5 mg Oral BID WC  . [START ON 05/12/2018] darunavir-cobicistat  1 tablet Oral Q breakfast  . [START ON 05/12/2018] dolutegravir  50 mg Oral Q breakfast  . furosemide  40 mg Intravenous BID  . isosorbide-hydrALAZINE  1 tablet Oral TID  . [START ON 05/12/2018] rilpivirine  25 mg Oral Q breakfast  . senna-docusate  1 tablet Oral Daily  . sodium bicarbonate  1,300 mg Oral BID   Infusions: . albumin human 25 g (05/11/18 1449)   PRN Meds: acetaminophen **OR** [DISCONTINUED] acetaminophen, bisacodyl, HYDROcodone-acetaminophen, labetalol, ondansetron **OR** ondansetron (ZOFRAN) IV, senna-docusate, traMADol   Allergies as of 05/10/2018 - Review Complete 05/10/2018  Allergen Reaction Noted  . Penicillins Anaphylaxis     Family History  Problem Relation Age of Onset  . Heart disease Mother   . Diabetes Mother   . Cancer Mother   . Diabetes Sister   . CAD Sister        ? stents  . Diabetes Maternal Grandmother   . Hypertension Brother    Social History   Tobacco Use  . Smoking status: Former Smoker    Packs/day: 1.00    Years: 35.00    Pack years: 35.00    Last attempt to quit: 11/23/2015    Years since quitting: 2.4  . Smokeless tobacco: Never Used  . Tobacco comment: would like help quitting  Substance Use Topics  . Alcohol use: Yes    Alcohol/week: 0.0 oz    Comment: beer weekends  . Drug use: No    Social History   Social History Narrative   Single - unemployed   + EtOH weekend beer   Former smoker   + THC     REVIEW OF SYSTEMS: Constitutional: Weakness, fatigue for about 2 weeks. ENT:  No nose bleeds Pulm: Shortness of breath, PND for about 2 weeks. CV: Lower extremity edema has greatly diminished overnight.  No palpitations.  Chest pain which is nonexertional. GU:  No hematuria, no frequency.  No change in the color of his urine. GI:  Per HPI Heme: Other than the bleeding coming from skin eruptions  on his butt, has not had any other unusual bleeding or bruising. Transfusions: No prior transfusions. Neuro:  No headaches, no peripheral tingling or numbness Derm:  No itching, no rash or sores.  Endocrine:  No sweats or chills.  No polyuria or dysuria Immunization: 8. Travel:  None beyond local  counties in last few months.    PHYSICAL EXAM: Vital signs in last 24 hours: Vitals:   05/11/18 1246 05/11/18 1641  BP: (!) 148/90 (!) 146/97  Pulse: 73 70  Resp: 17 15  Temp: 98.7 F (37.1 C) 97.7 F (36.5 C)  SpO2: 100% 96%   Wt Readings from Last 3 Encounters:  05/11/18 186 lb 8.2 oz (84.6 kg)  05/20/17 167 lb (75.8 kg)  04/23/17 168 lb (76.2 kg)    General: Patient does not look ill.  He is comfortable and alert. Head: No facial asymmetry or swelling.  No signs of head trauma. Eyes: No scleral icterus.  No conjunctival pallor.  EOMI. Ears: Hard of hearing. Nose: No congestion, no discharge. Mouth: Moist, clear, pink oral mucosa.  Tongue midline.  Dentition. Neck: No JVD, no masses, no thyromegaly. Lungs: Diminished but clear.  No rales, rhonchi or other adventitious sounds.  No shortness of breath.  No cough. Heart:.  No MRG.  S1, S2 present. Abdomen: Soft.  Not tender or distended.  No HSM, masses, bruits, hernias.  Active bowel sounds..   Rectal: Patient would not let me perform repeat DRE but there were no visible hemorrhoids at the rectum.  5 x 1.5 cm area of induration palpable in the subcutaneous layer, this is not tender.  Overlying this on the surface are rounded, small, currently dried up lesions.  This is consistent with a carbuncle. Musc/Skeltl: No joint redness, swelling or gross deformity. Extremities: Pitting lower extremity edema in the foot, ankle. Neurologic: Oriented x3.  Alert.  Moves all 4 limbs.  No tremor, no limb weakness. Skin: Other than the above described perirectal carbuncle, no suspicious skin lesions.   Psych: Cooperative.  Fluent  speech.  Intake/Output from previous day: 06/24 0701 - 06/25 0700 In: 1404.2 [P.O.:180; I.V.:474.2; Blood:650; IV Piggyback:100] Out: 600 [Urine:600] Intake/Output this shift: Total I/O In: 1063.8 [P.O.:840; I.V.:123.8; IV Piggyback:100] Out: -   LAB RESULTS: Recent Labs    05/10/18 1424  WBC 4.8  HGB 7.2*  HCT 22.9*  PLT 272   BMET Lab Results  Component Value Date   NA 141 05/11/2018   NA 141 05/10/2018   NA 143 05/10/2018   K 3.9 05/11/2018   K 3.8 05/10/2018   K 3.8 05/10/2018   CL 117 (H) 05/11/2018   CL 113 (H) 05/10/2018   CL 117 (H) 05/10/2018   CO2 13 (L) 05/11/2018   CO2 15 (L) 05/10/2018   CO2 15 (L) 05/10/2018   GLUCOSE 137 (H) 05/11/2018   GLUCOSE 102 (H) 05/10/2018   GLUCOSE 106 (H) 05/10/2018   BUN 71 (H) 05/11/2018   BUN 75 (H) 05/10/2018   BUN 73 (H) 05/10/2018   CREATININE 5.92 (H) 05/11/2018   CREATININE 5.91 (H) 05/10/2018   CREATININE 5.82 (H) 05/10/2018   CALCIUM 7.5 (L) 05/11/2018   CALCIUM 7.3 (L) 05/10/2018   CALCIUM 7.5 (L) 05/10/2018   LFT Recent Labs    05/10/18 1749 05/11/18 1521  PROT 6.2*  --   ALBUMIN 1.5* 1.8*  AST 26  --   ALT 16*  --   ALKPHOS 36*  --   BILITOT 0.5  --    PT/INR Lab Results  Component Value Date   INR 1.04 05/10/2018   INR 1.00 05/12/2012   INR 1.53 (H) 04/06/2010    Drugs of Abuse     Component Value Date/Time   LABOPIA NONE DETECTED 05/11/2018 0606   COCAINSCRNUR NONE DETECTED 05/11/2018 0606  LABBENZ NONE DETECTED 05/11/2018 0606   AMPHETMU NONE DETECTED 05/11/2018 0606   THCU POSITIVE (A) 05/11/2018 0606   LABBARB (A) 05/11/2018 0606    Result not available. Reagent lot number recalled by manufacturer.     RADIOLOGY STUDIES: Dg Chest 2 View  Result Date: 05/10/2018 CLINICAL DATA:  Short of breath EXAM: CHEST - 2 VIEW COMPARISON:  12/26/2009 FINDINGS: The heart size and mediastinal contours are within normal limits. Both lungs are clear. The visualized skeletal structures are  unremarkable. IMPRESSION: No active cardiopulmonary disease. Electronically Signed   By: Franchot Gallo M.D.   On: 05/10/2018 15:24   US Renal  Result Date: 05/10/2018 CLINICAL DATA:  AKI, elevated BUN and creatinine. EXAM: RENAL / URINARY TRACT ULTRASOUND COMPLETE COMPARISON:  None. FINDINGS: Right Kidney: Length: 12 cm. RIGHT renal cortex is diffusely echogenic. Probable small cyst measuring 12 mm. No suspicious cortical mass or hydronephrosis seen. Left Kidney: Length: 13.5 cm. LEFT renal cortex is also diffusely echogenic. No suspicious mass or hydronephrosis seen. Bladder: Appears normal for degree of bladder distention. IMPRESSION: 1. Both kidneys are diffusely echogenic indicating medical renal disease. No suspicious mass or hydronephrosis within either kidney. 2. Bladder is unremarkable, partially decompressed. Electronically Signed   By: Franki Cabot M.D.   On: 05/10/2018 21:15     IMPRESSION:   *    Bleeding from Rectum vs from carbuncle.    *    Ileal inflammation versus neoplasm on CT 05/2017.  Altered bowel habits, no abdominal pain, no anorexia.  *    Normocytic anemia.  I suspect this is more due to his renal failure than it is due to blood loss.       *    AKI versus stage 5 CKD.  *     Chest pain.  Atypical.  CHF clinically and elevated BNP.  Troponin is mildly elevated.  CXR negative.  Awaiting reading of 2D echo. Lower extremity edema improved overnight.  *     Hypertensive urgency.  Improved with meds.  *    HIV.  Has not been taking medicine for a couple of years.  *    History of T-cell lymphoma of the jaw treated with XRT in 2009.  Has never followed up with oncology as well as Dr. Algis Downs plan.    PLAN:     *   Dr. Carlean Purl will be seeing the patient later today and decide whether or not to pursue colonoscopy.   Silvano Rusk  05/11/2018, 6:44 PM Phone [phone]      Agency Attending   I have taken an interval history, reviewed the chart and  examined the patient. I agree with the Advanced Practitioner's note, impression and recommendations.   His main issue is acute renal failure and I believe that is responsible for his anemia - NL Fe low TIBC high sat and sky high ferritin  Heme + noted, rectal bleeding vs from gluteal lesions - will see about endoscopic evaluation but not appropriate yet/now  He has these chronic gluteal boils - ? If they could be something else - could he have a fistulizing disease - consider pelvic MR - ? Something els but Dr. [name] has seen and dx boils so suspect so - ? Form of hidraadenitis  Gatha Mayer, MD, [facility] 05/11/2018 6:46 PM

## 2018-05-11 NOTE — Plan of Care (Signed)
Discussed with patient plan of care for the evening and diet with some teach back noted.

## 2018-05-11 NOTE — Consult Note (Addendum)
Cardiology Consultation:   Patient ID: [name]; [id number]; 03-Jan-1961   Admit date: 05/10/2018 Date of Consult: 05/11/2018  Primary Care Provider: [name], MD Primary Cardiologist: Pixie Casino, MD  - New  Patient Profile:   [name] is a 57 y.o. male with a hx of uncontrolled hypertension, HIV disease and noncompliance with antiviral therapy, T-cell lymphoma treated with radiation therapy, remote history of polysubstance abuse, tobacco and alcohol abuse, hip AVN s/p hip replacement who is being seen today for the evaluation of chest pain and shortness of breath at the request of Dr. Thereasa Solo.  History of Present Illness:   [name] was sent to the hospital from the infectious disease office for evaluation of chest pain, shortness of breath and lower extremity edema. The patient lives in [place] and comes here for ID follow up. He has not been taking his antiviral medications. On admission he was hypertensive with SBP in the 200's. His SCr was found to be 5.82. He denied prior history of kidney disease. Nephrology is following the patient this admission.   Upon my assessment [name] tells me that he has been having intermittent chest pain lasting for 6-7 minutes on his right side chest, right neck and right arm for the last approximate 2 weeks.  This is associated with shortness of breath and is not related to activity.  He says that yesterday as he walked into his ID office appointment his chest pain became very severe 10/10.  He has never had chest pain like this before.  He has also had lower extremity edema starting about 2 weeks ago.  He denies  orthopnea or PND.  H his chest pain is likely is pain had resolved last night but he did have one episode of right-sided chest pain no shortness of breath this morning.  [name] lives in [place] but gets home medical care in [place].  He often comes to [facility] to stay with friends.  He says that he  is not taking any medicines at all for several years because he cannot afford it.  He denies any history of MI or cardiac issues.  He says that he quit smoking about 2 years ago and no longer drinks any alcohol or uses any drugs.  Past Medical History:  Diagnosis Date  . 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 Candidiasis of mouth   . Chest pain, unspecified   . Diseases of lips    Angular cheilitis  . HIV infection ([place])   . Hypertension   . Lymphoma ([place])   . Personal history of unspecified disease of respiratory system   . Pneumocystosis ([place])   . Shingles   . Tobacco use     History reviewed. No pertinent surgical history.   Home Medications:  Prior to Admission medications   Medication Sig Start Date End Date Taking? Authorizing Provider  benazepril (LOTENSIN) 5 MG tablet Take 1 tablet (5 mg total) by mouth daily. Patient not taking: Reported on 04/23/2017 01/19/17   [name], MD  elvitegravir-cobicistat-emtricitabine-tenofovir (GENVOYA) 150-150-200-10 MG TABS tablet Take 1 tablet by mouth daily with breakfast. Patient not taking: Reported on 04/23/2017 01/19/17   [name], MD  fluconazole (DIFLUCAN) 100 MG tablet Take 1 tablet by mouth daily for one week then switched to one tablet each week Patient not taking: Reported on 05/20/2017 04/23/17   [name], MD  magic mouthwash SOLN  Take 5 mLs by mouth 3 (three) times daily as needed. Patient not taking: Reported on 04/23/2017 04/07/16   [name], MD  senna-docusate (SENOKOT-S) 8.6-50 MG per tablet Take 1 tablet by mouth daily. 05/26/12 05/26/13  [name], MD  sulfamethoxazole-trimethoprim (BACTRIM) 400-80 MG tablet Take 1 tablet by mouth 2 (two) times daily. Patient not taking: Reported on 05/20/2017 04/23/17   [name], MD  The patient was not taking any of his medication for the past several years  Inpatient Medications: Scheduled Meds: . amLODipine  10 mg Oral  Daily  . azithromycin  1,200 mg Oral Weekly  . darbepoetin (ARANESP) injection - NON-DIALYSIS  40 mcg Subcutaneous Once  . furosemide  40 mg Intravenous BID  . isosorbide-hydrALAZINE  1 tablet Oral TID  . senna-docusate  1 tablet Oral Daily  . sodium bicarbonate  1,300 mg Oral BID   Continuous Infusions: . albumin human     PRN Meds: acetaminophen **OR** [DISCONTINUED] acetaminophen, bisacodyl, HYDROcodone-acetaminophen, labetalol, ondansetron **OR** ondansetron (ZOFRAN) IV, senna-docusate, traMADol  Allergies:    Allergies  Allergen Reactions  . Penicillins Anaphylaxis    Has patient had a PCN reaction causing immediate rash, facial/tongue/throat swelling, SOB or lightheadedness with hypotension: Unknown Has patient had a PCN reaction causing severe rash involving mucus membranes or skin necrosis: Unknown Has patient had a PCN reaction that required hospitalization: Unknown Has patient had a PCN reaction occurring within the last 10 years: No If all of the above answers are "NO", then may proceed with Cephalosporin use.    Social History:   Social History   Socioeconomic History  . Marital status: Single    Spouse name: Not on file  . Number of children: Not on file  . Years of education: Not on file  . Highest education level: Not on file  Occupational History  . Not on file  Social Needs  . Financial resource strain: Not on file  . Food insecurity:    Worry: Not on file    Inability: Not on file  . Transportation needs:    Medical: Not on file    Non-medical: Not on file  Tobacco Use  . Smoking status: Former Smoker    Packs/day: 1.00    Years: 35.00    Pack years: 35.00    Last attempt to quit: 11/23/2015    Years since quitting: 2.4  . Smokeless tobacco: Never Used  . Tobacco comment: would like help quitting  Substance and Sexual Activity  . Alcohol use: Yes    Alcohol/week: 0.0 oz    Comment: beer weekends  . Drug use: No  . Sexual activity: Yes     Partners: Female    Comment: pt. declined condoms  Lifestyle  . Physical activity:    Days per week: Not on file    Minutes per session: Not on file  . Stress: Not on file  Relationships  . Social connections:    Talks on phone: Not on file    Gets together: Not on file    Attends religious service: Not on file    Active member of club or organization: Not on file    Attends meetings of clubs or organizations: Not on file    Relationship status: Not on file  . Intimate partner violence:    Fear of current or ex partner: Not on file    Emotionally abused: Not on file    Physically abused: Not on file  Forced sexual activity: Not on file  Other Topics Concern  . Not on file  Social History Narrative  . Not on file    Family History:    Family History  Problem Relation Age of Onset  . Heart disease Mother   . Diabetes Mother   . Cancer Mother   . Diabetes Sister   . CAD Sister        ? stents  . Diabetes Maternal Grandmother   . Hypertension Brother      ROS: Patient was not taking any medicine Please see the history of present illness.   All other ROS reviewed and negative.     Physical Exam/Data:   Vitals:   05/11/18 0746 05/11/18 0900 05/11/18 1200 05/11/18 1246  BP: (!) 168/111 (!) 179/110 (!) 148/90 (!) 148/90  Pulse: 79 74 73 73  Resp: 17 16 16 17   Temp: 98.6 F (37 C)   98.7 F (37.1 C)  TempSrc: Oral   Oral  SpO2: 100% 99% 98% 100%  Weight:      Height:        Intake/Output Summary (Last 24 hours) at 05/11/2018 1429 Last data filed at 05/11/2018 1200 Gross per 24 hour  Intake 2007.95 ml  Output 600 ml  Net 1407.95 ml   Filed Weights   05/10/18 1421 05/10/18 2214 05/11/18 0416  Weight: 167 lb (75.8 kg) 186 lb 8.2 oz (84.6 kg) 186 lb 8.2 oz (84.6 kg)   Body mass index is 31.04 kg/m.  General:  Well nourished, well developed, in no acute distress HEENT: normal Lymph: no adenopathy Neck: no JVD Endocrine:  No thryomegaly Vascular: No  carotid bruits; FA pulses 2+ bilaterally without bruits  Cardiac:  normal S1, S2; RRR; no murmur  Lungs:  clear to auscultation bilaterally, no wheezing, rhonchi or rales  Abd: soft, nontender, no hepatomegaly  Ext: Tight lower leg edema Musculoskeletal:  No deformities, BUE and BLE strength normal and equal Skin: warm and dry  Neuro:  CNs 2-12 intact, no focal abnormalities noted Psych:  Normal affect   EKG:  The EKG was personally reviewed and demonstrates:  Sinus rhythm with nonspecific T wave changes V3-6 Telemetry:  Telemetry was personally reviewed and demonstrates:  Sinus rhythm 80's-100's  Relevant CV Studies:  Echocardiogram 05/11/18 Study Conclusions - Left ventricle: The cavity size was normal. Wall thickness was   increased in a pattern of moderate LVH. Systolic function was   mildly reduced. The estimated ejection fraction was in the range   of 45% to 50%. Diffuse hypokinesis. Doppler parameters are   consistent with abnormal left ventricular relaxation (grade 1   diastolic dysfunction). - Left atrium: The atrium was moderately dilated. - Pericardium, extracardiac: A trivial pericardial effusion was   identified.  Laboratory Data:  Chemistry Recent Labs  Lab 05/10/18 1424 05/10/18 1749  NA 143 141  K 3.8 3.8  CL 117* 113*  CO2 15* 15*  GLUCOSE 106* 102*  BUN 73* 75*  CREATININE 5.82* 5.91*  CALCIUM 7.5* 7.3*  GFRNONAA 10* 10*  GFRAA 11* 11*  ANIONGAP 11 13    Recent Labs  Lab 05/10/18 1749  PROT 6.2*  ALBUMIN 1.5*  AST 26  ALT 16*  ALKPHOS 36*  BILITOT 0.5   Hematology Recent Labs  Lab 05/10/18 1424 05/10/18 1906  WBC 4.8  --   RBC 2.72* 2.69*  HGB 7.2*  --   HCT 22.9*  --   MCV 84.2  --   [facility]  26.5  --   MCHC 31.4  --   RDW 17.3*  --   PLT 272  --    Cardiac Enzymes Recent Labs  Lab 05/10/18 1906 05/10/18 2225  TROPONINI 0.15* 0.18*    Recent Labs  Lab 05/10/18 1457  TROPIPOC 0.11*    BNP Recent Labs  Lab 05/10/18 1424    BNP 788.2*    DDimer No results for input(s): DDIMER in the last 168 hours.  Radiology/Studies:  Dg Chest 2 View  Result Date: 05/10/2018 CLINICAL DATA:  Short of breath EXAM: CHEST - 2 VIEW COMPARISON:  12/26/2009 FINDINGS: The heart size and mediastinal contours are within normal limits. Both lungs are clear. The visualized skeletal structures are unremarkable. IMPRESSION: No active cardiopulmonary disease. Electronically Signed   By: Franchot Gallo M.D.   On: 05/10/2018 15:24   US Renal  Result Date: 05/10/2018 CLINICAL DATA:  AKI, elevated BUN and creatinine. EXAM: RENAL / URINARY TRACT ULTRASOUND COMPLETE COMPARISON:  None. FINDINGS: Right Kidney: Length: 12 cm. RIGHT renal cortex is diffusely echogenic. Probable small cyst measuring 12 mm. No suspicious cortical mass or hydronephrosis seen. Left Kidney: Length: 13.5 cm. LEFT renal cortex is also diffusely echogenic. No suspicious mass or hydronephrosis seen. Bladder: Appears normal for degree of bladder distention. IMPRESSION: 1. Both kidneys are diffusely echogenic indicating medical renal disease. No suspicious mass or hydronephrosis within either kidney. 2. Bladder is unremarkable, partially decompressed. Electronically Signed   By: Franki Cabot M.D.   On: 05/10/2018 21:15    Assessment and Plan:   Chest pain -New onset of intermittent chest pain with shortness of breath-approximately 2 weeks ago, not associated with exertion, along with lower extremity edema -Troponins are mildly elevated and a flat pattern 0.11, 0.15, 0.18 -His pain and elevated troponins are likely related to demand ischemia in the setting of his uncontrolled hypertension.  However with his medical history significant for HIV and noncompliance with medical therapy, will likely advise ischemic evaluation with nuclear stress test.  His renal function precludes a cardiac CTA or cardiac catheterization at this time. -Dr. Debara Pickett to see pt.   Acute systolic and  diastolic heart failure -BNP on admission was 788.2.  -Patient had hypertensive urgency upon admission and has not been taking his hypertension medication for several years due to cost  -new onset lower extremity edema approximately 2 weeks ago per patient report  -Echocardiogram showed-moderate LVH with mildly reduced systolic function, EF 89-21%, diffuse hypokinesis, grade 1 diastolic dysfunction -Blood pressure under better control on BiDil and amlodipine. He will need improved BP control and medication compliance going forward. -He is also being diuresed with Lasix 40 mg IV twice daily with improved breathing -Weight is unchanged and urine output is unimpressive.  Will defer to nephrology for diuresis considering his acute renal insufficiency -This is likely related to hypertensive heart disease. Will discuss ischemic evaluation with Dr. Debara Pickett to rule out ischemic cardiomyopathy. -Will discuss initiating carvedilol 3.125 mg bid with Dr. Debara Pickett. No ACE-I/ARB due to renal function. On Bidil.   Hypertensive heart disease -Home meds include benazepril, however the patient admits that he has not been taking any medications at all for several years -BP elevated on admission 200's/120's -Pt has been started on amlodipine 10 mg daily, Bidil 20-37.5 mg TID with prn labetalol.  -Improvement in BP 140's/90  Acute renal failure -No known history of kidney disease. Kidney function was normal in July 2018.  On admission the serum creatinine level found to be  5.82 -Nephrology following. kidney ultrasound showed bilateral echogenic kidney consistent with CKD. Pt was on benazepril and Bactrim at home, unknown if patient is really taking those. -Now volume overloaded, diuresing with IV lasix. Nephrology started IV albumin x 3 doses and Recommend to hold off on Bactrim.   For questions or updates, please contact [place] Please consult [email or web address] for contact info under Cardiology/STEMI.    Signed, Daune Perch, NP  05/11/2018 2:29 PM

## 2018-05-11 NOTE — Consult Note (Signed)
[facility] for Infectious Disease    Date of Admission:  05/10/2018         Reason for Consult: HIV    Referring Provider: Thereasa Solo Primary Care Provider: [name], MD   Assessment/Plan:  [name] is a 57 y/o male with previous medical history of HIV, hypertension and lymphoma presented to the ID clinic with hypertensive urgency and referred to the ED. Admitted with hypertensive urgency and acute kidney injury. He has not been taking any medications for his HIV/AIDs.   AIDS - Nonadherent with prescribed medication regimen. No evidence of opportunistic infection presently through history or physical exam. Will restart ART with ripilvirine, dolutegravir, and darunivir/cobicistat. We will check his [id number], CD4 count, HIV viral load with genotype, G6PD, Toxoplasmosis IGG, and cryptococcal antigen. He will need OI prophylaxis with dapsone pending G6PD results.   Hypertensive Urgency - Blood pressures appear improved as well as lower extremity edema. Changes per primary team, cardiology, and nephrology.   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 Appreciate nephrology evaluation and treatment.    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 amLODipine  10 mg Oral Daily  . azithromycin  1,200 mg Oral Weekly  . [START ON 05/12/2018] darunavir-cobicistat  1 tablet Oral Q breakfast  . [START ON 05/12/2018] dolutegravir  50 mg Oral Q breakfast  . furosemide  40 mg Intravenous BID  . isosorbide-hydrALAZINE  1 tablet Oral TID  . [START ON 05/12/2018] rilpivirine  25 mg Oral Q breakfast  . senna-docusate  1 tablet Oral Daily  . sodium bicarbonate  1,300 mg Oral BID     HPI: [name] is a 57  y.o. male with a previous medical history of HIV, hypertension, and lymphoma who was sent to the ED from the ID clinic with the chief complaints of chest pain, diaphoresis, shortness of breath and lower extremity edema. He was hypertensive in the clinic with a blood pressure of 241/142. He was found to be hemoccult positive. EKG with normal sinus rhythm and nonspecific ST and T wave abnormality. He was found to have severe acute kidney injury with a creatinine of 5.82 with no previous history of kidney disease. BNP was found to be over 700. He has been admitted with hypertensive urgency, HIV, acute kidney injury and BRBR.   He was last seen in the ID clinic 04/23/17 where he was prescribed darunivir and Genvoya. He states that he has not taken any medication since he was last seen primarily because of the cost in addition to that he does not remember to take the medication. He has been on several regimens in the past which have made him not feel right at times. His most recent CD4 count was 80 and his viral load was 80,500.   Review of Systems: Review of Systems  Constitutional: Negative for chills, diaphoresis, fever and malaise/fatigue.  Eyes: Negative for blurred vision, double vision, photophobia, pain, discharge and redness.  Respiratory: Negative for cough.   Cardiovascular: Negative for chest pain, palpitations and leg swelling.  Gastrointestinal: Negative for abdominal pain, blood in stool, constipation, diarrhea, nausea and vomiting.  Genitourinary: Negative for dysuria, flank pain, frequency, hematuria and urgency.  Skin: Negative for rash.  Neurological: Negative for dizziness, sensory change, weakness and headaches.  Psychiatric/Behavioral: Negative for depression.     Past Medical History:  Diagnosis Date  . 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 Candidiasis of mouth   . Chest pain, unspecified   . Diseases of lips    Angular cheilitis  . HIV infection ([place])     . Hypertension   . Lymphoma ([place])   . Personal history of unspecified disease of respiratory system   . Pneumocystosis ([place])   . Shingles   . Tobacco use     Social History   Tobacco Use  . Smoking status: Former Smoker    Packs/day: 1.00    Years: 35.00    Pack years: 35.00    Last attempt to quit: 11/23/2015    Years since quitting: 2.4  . Smokeless tobacco: Never Used  . Tobacco comment: would like help quitting  Substance Use Topics  . Alcohol use: Yes    Alcohol/week: 0.0 oz    Comment: beer weekends  . Drug use: No    Family History  Problem Relation Age of Onset  . Heart disease Mother   . Diabetes Mother   . Cancer Mother   . Diabetes Sister   . CAD Sister        ? stents  . Diabetes Maternal Grandmother   . Hypertension Brother     Allergies  Allergen Reactions  . Penicillins Anaphylaxis    Has patient had a PCN reaction causing immediate rash, facial/tongue/throat swelling, SOB or lightheadedness with hypotension: Unknown Has patient had a PCN reaction causing severe rash involving mucus membranes or skin necrosis: Unknown Has patient had a PCN reaction that required hospitalization: Unknown Has patient had a PCN reaction occurring within the last 10 years: No If all of the above answers are "NO", then may proceed with Cephalosporin use.    OBJECTIVE: Blood pressure (!) 148/90, pulse 73, temperature 98.7 F (37.1 C), temperature source Oral, resp. rate 17, height '5\' 5"'  (1.651 m), weight 186 lb 8.2 oz (84.6 kg), SpO2 100 %.  Physical Exam  Constitutional: He is oriented to person, place, and time. He appears well-developed and well-nourished. No distress.  Lying in bed with head of bed elevated.   Cardiovascular: Normal rate, regular rhythm, normal heart sounds and intact distal pulses. Exam reveals no gallop and no friction rub.  No murmur heard. Pulmonary/Chest: Effort normal and breath sounds normal. No stridor. No respiratory distress. He has no  wheezes. He has no rales. He exhibits no tenderness.  Neurological: He is alert and oriented to person, place, and time.  Skin: Skin is warm and dry. No rash noted.  Psychiatric: He has a normal mood and affect. His behavior is normal. Judgment and thought content normal.    Lab Results Lab Results  Component Value Date   WBC 4.8 05/10/2018   HGB 7.2 (L) 05/10/2018   HCT 22.9 (L) 05/10/2018   MCV 84.2 05/10/2018   PLT 272 05/10/2018    Lab Results  Component Value Date   CREATININE 5.91 (H) 05/10/2018   BUN 75 (H) 05/10/2018   NA 141 05/10/2018   K 3.8 05/10/2018   CL 113 (H) 05/10/2018   CO2 15 (L) 05/10/2018    Lab Results  Component Value Date   ALT 16 (L) 05/10/2018   AST 26 05/10/2018   ALKPHOS 36 (L) 05/10/2018   BILITOT 0.5  05/10/2018     Microbiology: Recent Results (from the past 240 hour(s))  MRSA PCR Screening     Status: None   Collection Time: 05/10/18 11:00 PM  Result Value Ref Range Status   MRSA by PCR NEGATIVE NEGATIVE Final    Comment:        The GeneXpert MRSA Assay (FDA approved for NASAL specimens only), is one component of a comprehensive MRSA colonization surveillance program. It is not intended to diagnose MRSA infection nor to guide or monitor treatment for MRSA infections. Performed at [facility], [place] [address], [place], [place] [address]      [name], [place] for [place] [phone] Pager  05/11/2018  3:23 PM

## 2018-05-11 NOTE — Progress Notes (Addendum)
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  He also reported B LE swelling, paroxysmal nocturnal dyspnea, and bright red blood per rectum for a weeks duration.   Significant Events: 6/24 admit   Subjective: Pt refused to allow blood work this morning. He has been very grumpy w/ his nurse, and the echo tech, but was quite pleasant w/ me.  He reports signif improvement in his sob, and peripheral edema.  He denies current CP.  He states he has intermittently noted bright red blood streaks on his toilet paper, but that this is only an intermittent occurrence.  He denies melena or large scale hematochezia.     Assessment & Plan:  Acute kidney failure  crt was 1.26 May 2017 - no hydronephrosis on renal US - has made 600cc urine since admit - was on an ACEi prior to admit, though it is not clear if he was taking it - check UA - no acute indication for HD at present, but will ask Nephrology to see given severity of failure    Recent Labs  Lab 05/10/18 1424 05/10/18 1749  CREATININE 5.82* 5.91*    Uncontrolled HTN Likely driven by signif volume overload - diurese as able - adjust medical tx   AIDS / HIV - noncompliant with treatment Viral load was 80K w/ CD4 80 June 2018 - I have asked ID to see to direct HIV meds and possible need for prophylactic med tx   BRBPR Does not sound like a hemodynamically signif bleed clinically - follow for now - may benefit from flex sig as outpt   Normocytic Anemia Pt is not Fe deficient - Fe studies most c/w ACD - suspect this is a combination of AIDS +  renal failure - doubt significant acute GIB   Hx of chronic "boil" of buttock Worrisome for possible fistula track - will need to consider further evaluation when acute clinical issues more stable - he reports a prior hx of multiple I&Ds as well as courses of abx  DVT prophylaxis: SCDs Code Status: FULL CODE Family Communication: no family present at time of exam  Disposition Plan: SDU  Consultants:  Nephrology ID  Antimicrobials:  None  Objective: Blood pressure (!) 168/111, pulse 79, temperature 98.6 F (37 C), temperature source Oral, resp. rate 17, height 5\' 5"  (1.651 m), weight 84.6 kg (186 lb 8.2 oz), SpO2 100 %.  Intake/Output Summary (Last 24 hours) at 05/11/2018 0920 Last data filed at 05/11/2018 0415 Gross per 24 hour  Intake 1404.2 ml  Output 600 ml  Net 804.2 ml   Filed Weights   05/10/18 1421 05/10/18 2214 05/11/18 0416  Weight: 75.8 kg (167 lb) 84.6 kg (186 lb 8.2 oz) 84.6 kg (186 lb 8.2 oz)    Examination: General: No acute respiratory distress - alert and oriented  Lungs: Clear to auscultation bilaterally - mild bibasilar crackles  Cardiovascular: Regular rate and rhythm without murmur  Abdomen: Nontender, nondistended, soft, bowel sounds positive, no rebound, no ascites, no appreciable mass Extremities: 2+ diffuse  edema (anasarca) - no cyanosis   CBC: Recent Labs  Lab 05/10/18 1424  WBC 4.8  HGB 7.2*  HCT 22.9*  MCV 84.2  PLT 884   Basic Metabolic Panel: Recent Labs  Lab 05/10/18 1424 05/10/18 1749  NA 143 141  K 3.8 3.8  CL 117* 113*  CO2 15* 15*  GLUCOSE 106* 102*  BUN 73* 75*  CREATININE 5.82* 5.91*  CALCIUM 7.5* 7.3*   GFR: Estimated Creatinine Clearance: 14 mL/min (A) (by C-G formula based on SCr of 5.91 mg/dL (H)).  Liver Function Tests: Recent Labs  Lab 05/10/18 1749  AST 26  ALT 16*  ALKPHOS 36*  BILITOT 0.5  PROT 6.2*  ALBUMIN 1.5*    Coagulation Profile: Recent Labs  Lab 05/10/18 1749  INR 1.04    Cardiac  Enzymes: Recent Labs  Lab 05/10/18 1906 05/10/18 2225  TROPONINI 0.15* 0.18*    Recent Results (from the past 240 hour(s))  MRSA PCR Screening     Status: None   Collection Time: 05/10/18 11:00 PM  Result Value Ref Range Status   MRSA by PCR NEGATIVE NEGATIVE Final    Comment:        The GeneXpert MRSA Assay (FDA approved for NASAL specimens only), is one component of a comprehensive MRSA colonization surveillance program. It is not intended to diagnose MRSA infection nor to guide or monitor treatment for MRSA infections. Performed at [facility], [place] [address] [place], [place] [address]      Scheduled Meds: . amLODipine  10 mg Oral Daily  . senna-docusate  1 tablet Oral Daily     LOS: 1 day   Cherene Altes, MD [facility] Office  [phone] Pager - Text Page per Amion as per below:  On-Call/Text Page:      [email or web address]      password [email or web address]  If 7PM-7AM, please contact night-coverage [email or web address] Password [email or web address] 05/11/2018, 9:20 AM

## 2018-05-11 NOTE — Progress Notes (Signed)
  Echocardiogram 2D Echocardiogram has been performed.  [name] M 05/11/2018, 9:28 AM

## 2018-05-11 NOTE — Consult Note (Addendum)
[facility] Nephrology Consultation Note  Requesting MD: Dr. Mcclung Reason for consult: AKI  HPI:  [name] is a 57 y.o. male.  History of uncontrolled hypertension, HIV disease noncompliance with antiviral medication, T-cell lymphoma of the right side treated with radiation therapy, remote history of polysubstance abuse, tobacco and alcohol abuse, hip AVN status post hip replacement, sent from ID office for the evaluation of chest pain shortness of breath and lower extremity edema.  Patient reported that he lives in [place] and comes here for ID follow-up.  He has not been taking any antiviral medication.  He denied any history of kidney disease.  His kidney function was normal in July 2018.  On admission the serum creatinine level found to be 5.82, elevated BNP, positive troponin.  Started on IV Lasix for possible CHF.  Serum creatinine level increased to 5.91 today.  The kidney ultrasound showed bilateral echogenic kidney consistent with CKD.  Patient has CD4 count of 80. He is on benazepril and Bactrim at home, unknown if patient is really taking those. With Lasix patient reported feeling better in terms of shortness of breath.  He denied nausea vomiting or chest pain today.  No dysuria urgency. Denied use of NSAIDs or herbal medication. Denied family history of kidney disease.  Creat  Date/Time Value Ref Range Status  04/23/2017 05:28 PM 0.67 (L) 0.70 - 1.33 mg/dL Final    Comment:      For patients > or = 57 years of age: The upper reference limit for Creatinine is approximately 13% higher for people identified as African-American.     09/14/2014 09:47 AM 0.76 0.50 - 1.35 mg/dL Final  08/19/2013 10:43 AM 0.77 0.50 - 1.35 mg/dL Final  05/12/2012 11:14 AM 0.84 0.50 - 1.35 mg/dL Final   Creatinine, Ser  Date/Time Value Ref Range Status  05/10/2018 05:49 PM 5.91 (H) 0.61 - 1.24 mg/dL Final  05/10/2018 02:24 PM 5.82 (H) 0.61 - 1.24 mg/dL Final  05/20/2017  10:37 AM 1.10 0.61 - 1.24 mg/dL Final  11/19/2010 07:53 PM 0.74 0.40 - 1.50 mg/dL Final  04/29/2010 08:29 PM 0.72 0.40 - 1.50 mg/dL Final  04/06/2010 05:00 AM 1.47 0.4 - 1.5 mg/dL Final  04/05/2010 05:40 AM 1.00 0.4 - 1.5 mg/dL Final  04/03/2010 03:51 PM 0.83 0.4 - 1.5 mg/dL Final  01/04/2010 06:48 AM 0.86 0.4 - 1.5 mg/dL Final  01/03/2010 06:32 AM 1.18 0.4 - 1.5 mg/dL Final  01/02/2010 06:14 AM 1.09 0.4 - 1.5 mg/dL Final  12/26/2009 02:04 PM 0.86 0.4 - 1.5 mg/dL Final  10/24/2009 06:33 PM 0.87 (0.40-1.50 mg/dL Final  08/01/2009 08:01 PM 0.91 (0.40-1.50 mg/dL Final  03/26/2009 07:26 PM 0.76 0.40 - 1.50 mg/dL Final  11/30/2008 08:50 PM 0.95 0.40 - 1.50 mg/dL Final  11/20/2008 03:31 PM 0.72 0.40 - 1.50 mg/dL Final  09/05/2008 09:49 PM 0.71 0.40 - 1.50 mg/dL Final  05/29/2008 12:10 PM 0.64  Final  05/10/2008 02:45 PM 0.80 0.40 - 1.50 mg/dL Final  03/28/2008 08:48 PM 0.93 0.40 - 1.50 mg/dL Final  12/27/2007 08:49 PM 0.71 0.40 - 1.50 mg/dL Final  09/06/2007 08:53 PM 0.74 0.40 - 1.50 mg/dL Final  04/01/2007 08:36 PM 1.07 0.40 - 1.50 mg/dL Final  11/23/2006 08:36 PM 0.80 0.40 - 1.50 mg/dL Final     PMHx:   Past Medical History:  Diagnosis Date  . 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 Candidiasis of mouth   . Chest pain,   unspecified   . Diseases of lips    Angular cheilitis  . HIV infection (HCC)   . Hypertension   . Lymphoma (HCC)   . Personal history of unspecified disease of respiratory system   . Pneumocystosis (HCC)   . Shingles   . Tobacco use     History reviewed. No pertinent surgical history.  Family Hx:  Family History  Problem Relation Age of Onset  . Heart disease Mother   . Diabetes Mother   . Diabetes Sister   . Diabetes Maternal Grandmother     Social History:  reports that he quit smoking about 2 years ago. He has a 35.00 pack-year smoking history. He has never used smokeless tobacco. He reports that he drinks alcohol. He reports  that he does not use drugs.  Allergies:  Allergies  Allergen Reactions  . Penicillins Anaphylaxis    Has patient had a PCN reaction causing immediate rash, facial/tongue/throat swelling, SOB or lightheadedness with hypotension: Unknown Has patient had a PCN reaction causing severe rash involving mucus membranes or skin necrosis: Unknown Has patient had a PCN reaction that required hospitalization: Unknown Has patient had a PCN reaction occurring within the last 10 years: No If all of the above answers are "NO", then may proceed with Cephalosporin use.    Medications: Prior to Admission medications   Medication Sig Start Date End Date Taking? Authorizing Provider  benazepril (LOTENSIN) 5 MG tablet Take 1 tablet (5 mg total) by mouth daily. Patient not taking: Reported on 04/23/2017 01/19/17   [name], [name], MD  elvitegravir-cobicistat-emtricitabine-tenofovir (GENVOYA) 150-150-200-10 MG TABS tablet Take 1 tablet by mouth daily with breakfast. Patient not taking: Reported on 04/23/2017 01/19/17   [name], [name], MD  fluconazole (DIFLUCAN) 100 MG tablet Take 1 tablet by mouth daily for one week then switched to one tablet each week Patient not taking: Reported on 05/20/2017 04/23/17   [name], [name], MD  magic mouthwash SOLN Take 5 mLs by mouth 3 (three) times daily as needed. Patient not taking: Reported on 04/23/2017 04/07/16   [name], [name], MD  senna-docusate (SENOKOT-S) 8.6-50 MG per tablet Take 1 tablet by mouth daily. 05/26/12 05/26/13  [name], [name], MD  sulfamethoxazole-trimethoprim (BACTRIM) 400-80 MG tablet Take 1 tablet by mouth 2 (two) times daily. Patient not taking: Reported on 05/20/2017 04/23/17   [name], [name], MD    I have reviewed the patient's current medications.  Labs:  Results for orders placed or performed during the hospital encounter of 05/10/18 (from the past 48 hour(s))  Basic metabolic panel     Status: Abnormal   Collection Time: 05/10/18  2:24 PM   Result Value Ref Range   Sodium 143 135 - 145 mmol/L   Potassium 3.8 3.5 - 5.1 mmol/L   Chloride 117 (H) 101 - 111 mmol/L   CO2 15 (L) 22 - 32 mmol/L   Glucose, Bld 106 (H) 65 - 99 mg/dL   BUN 73 (H) 6 - 20 mg/dL   Creatinine, Ser 5.82 (H) 0.61 - 1.24 mg/dL   Calcium 7.5 (L) 8.9 - 10.3 mg/dL   GFR calc non Af Amer 10 (L) >60 mL/min   GFR calc Af Amer 11 (L) >60 mL/min    Comment: (NOTE) The eGFR has been calculated using the CKD EPI equation. This calculation has not been validated in all clinical situations. eGFR's persistently <60 mL/min signify possible Chronic Kidney Disease.    Anion gap 11 5 - 15      Comment: Performed at [facility], [place] [address], [place], [place] [address]  CBC     Status: Abnormal   Collection Time: 05/10/18  2:24 PM  Result Value Ref Range   WBC 4.8 4.0 - 10.5 K/uL   RBC 2.72 (L) 4.22 - 5.81 MIL/uL   Hemoglobin 7.2 (L) 13.0 - 17.0 g/dL   HCT 22.9 (L) 39.0 - 52.0 %   MCV 84.2 78.0 - 100.0 fL   MCH 26.5 26.0 - 34.0 pg   MCHC 31.4 30.0 - 36.0 g/dL   RDW 17.3 (H) 11.5 - 15.5 %   Platelets 272 150 - 400 K/uL    Comment: Performed at [facility] [address], [place], [place] [address]  Brain natriuretic peptide     Status: Abnormal   Collection Time: 05/10/18  2:24 PM  Result Value Ref Range   B Natriuretic Peptide 788.2 (H) 0.0 - 100.0 pg/mL    Comment: Performed at [facility] [address] [place], [place] [address]  I-stat troponin, ED     Status: Abnormal   Collection Time: 05/10/18  2:57 PM  Result Value Ref Range   Troponin i, poc 0.11 (HH) 0.00 - 0.08 ng/mL   Comment NOTIFIED PHYSICIAN    Comment 3            Comment: Due to the release kinetics of cTnI, a negative result within the first hours of the onset of symptoms does not rule out myocardial infarction with certainty. If myocardial infarction is still suspected, repeat the test at appropriate intervals.   POC occult blood, ED Provider will collect      Status: Abnormal   Collection Time: 05/10/18  4:47 PM  Result Value Ref Range   Fecal Occult Bld POSITIVE (A) NEGATIVE  Type and screen [place]     Status: None (Preliminary result)   Collection Time: 05/10/18  5:06 PM  Result Value Ref Range   ABO/RH(D) O POS    Antibody Screen NEG    Sample Expiration 05/13/2018    Unit Number [id number]    Blood Component Type RED CELLS,LR    Unit division 00    Status of Unit ISSUED,FINAL    Transfusion Status OK TO TRANSFUSE    Crossmatch Result      Compatible Performed at [facility], [place] [address], [place], [place] [address]    Unit Number [id number]    Blood Component Type RED CELLS,LR    Unit division 00    Status of Unit ISSUED    Transfusion Status OK TO TRANSFUSE    Crossmatch Result Compatible   ABO/Rh     Status: None   Collection Time: 05/10/18  5:06 PM  Result Value Ref Range   ABO/RH(D)      O POS Performed at [facility] [address], [place], [place] [address]   Comprehensive metabolic panel     Status: Abnormal   Collection Time: 05/10/18  5:49 PM  Result Value Ref Range   Sodium 141 135 - 145 mmol/L   Potassium 3.8 3.5 - 5.1 mmol/L    Comment: SLIGHT HEMOLYSIS   Chloride 113 (H) 101 - 111 mmol/L   CO2 15 (L) 22 - 32 mmol/L   Glucose, Bld 102 (H) 65 - 99 mg/dL   BUN 75 (H) 6 - 20 mg/dL   Creatinine, Ser 5.91 (H) 0.61 - 1.24 mg/dL   Calcium 7.3 (L)  8.9 - 10.3 mg/dL   Total Protein 6.2 (L) 6.5 - 8.1 g/dL   Albumin 1.5 (L) 3.5 - 5.0 g/dL   AST 26 15 - 41 U/L   ALT 16 (L) 17 - 63 U/L   Alkaline Phosphatase 36 (L) 38 - 126 U/L   Total Bilirubin 0.5 0.3 - 1.2 mg/dL   GFR calc non Af Amer 10 (L) >60 mL/min   GFR calc Af Amer 11 (L) >60 mL/min    Comment: (NOTE) The eGFR has been calculated using the CKD EPI equation. This calculation has not been validated in all clinical situations. eGFR's persistently <60 mL/min signify possible Chronic Kidney Disease.    Anion gap  13 5 - 15    Comment: Performed at [facility] [address], [place], [place] [address]  Protime-INR     Status: None   Collection Time: 05/10/18  5:49 PM  Result Value Ref Range   Prothrombin Time 13.5 11.4 - 15.2 seconds   INR 1.04     Comment: Performed at [facility] [address], [place], [place] [address]  TSH     Status: None   Collection Time: 05/10/18  7:06 PM  Result Value Ref Range   TSH 4.213 0.350 - 4.500 uIU/mL    Comment: Performed by a 3rd Generation assay with a functional sensitivity of <=0.01 uIU/mL. Performed at [facility], [place] [address] [place], [place] [address]   Troponin I     Status: Abnormal   Collection Time: 05/10/18  7:06 PM  Result Value Ref Range   Troponin I 0.15 (HH) <0.03 ng/mL    Comment: CRITICAL RESULT CALLED TO, READ BACK BY AND VERIFIED WITH: Marlinda Mike RN 2011 05/10/2018 WBOND Performed at [facility], [place] [address] [place], [place] [address]   Ethanol     Status: None   Collection Time: 05/10/18  7:06 PM  Result Value Ref Range   Alcohol, Ethyl (B) <10 <10 mg/dL    Comment: (NOTE) Lowest detectable limit for serum alcohol is 10 mg/dL. For medical purposes only. Performed at [facility], [place] [address] [place], [place] [address]   Vitamin B12     Status: None   Collection Time: 05/10/18  7:06 PM  Result Value Ref Range   Vitamin B-12 443 180 - 914 pg/mL    Comment: (NOTE) This assay is not validated for testing neonatal or myeloproliferative syndrome specimens for Vitamin B12 levels. Performed at [facility], [place] [address], [place], [place] [address]   Iron and TIBC     Status: Abnormal   Collection Time: 05/10/18  7:06 PM  Result Value Ref Range   Iron 83 45 - 182 ug/dL   TIBC 190 (L) 250 - 450 ug/dL   Saturation Ratios 44 (H) 17.9 - 39.5 %   UIBC 107 ug/dL    Comment: Performed at [facility] [address] [place], [place] [address]  Ferritin     Status:  Abnormal   Collection Time: 05/10/18  7:06 PM  Result Value Ref Range   Ferritin 1,380 (H) 24 - 336 ng/mL    Comment: Performed at [facility] [address], [place], [place] [address]  Reticulocytes     Status: Abnormal   Collection Time: 05/10/18  7:06 PM  Result Value Ref Range   Retic Ct Pct 1.3 0.4 - 3.1 %   RBC. 2.69 (L) 4.22 -  5.81 MIL/uL   Retic Count, Absolute 35.0 19.0 - 186.0 K/uL    Comment: Performed at [facility], [address] [place], [place] [address]  Prepare RBC     Status: None   Collection Time: 05/10/18  7:35 PM  Result Value Ref Range   Order Confirmation      ORDER PROCESSED BY BLOOD BANK Performed at [facility], [address] [place], [place] [address]   Troponin I     Status: Abnormal   Collection Time: 05/10/18 10:25 PM  Result Value Ref Range   Troponin I 0.18 (HH) <0.03 ng/mL    Comment: CRITICAL VALUE NOTED.  VALUE IS CONSISTENT WITH PREVIOUSLY REPORTED AND CALLED VALUE. Performed at [facility], [address] [place], [place] [address]   MRSA PCR Screening     Status: None   Collection Time: 05/10/18 11:00 PM  Result Value Ref Range   MRSA by PCR NEGATIVE NEGATIVE    Comment:        The GeneXpert MRSA Assay (FDA approved for NASAL specimens only), is one component of a comprehensive MRSA colonization surveillance program. It is not intended to diagnose MRSA infection nor to guide or monitor treatment for MRSA infections. Performed at [facility], [address] [place], [place] [address]   Urine rapid drug screen (hosp performed)     Status: Abnormal   Collection Time: 05/11/18  6:06 AM  Result Value Ref Range   Opiates NONE DETECTED NONE DETECTED   Cocaine NONE DETECTED NONE DETECTED   Benzodiazepines NONE DETECTED NONE DETECTED   Amphetamines NONE DETECTED NONE DETECTED   Tetrahydrocannabinol POSITIVE (A) NONE DETECTED   Barbiturates (A) NONE DETECTED    Result not available. Reagent lot  number recalled by manufacturer.    Comment: Performed at  Hospital Lab, [address] [place], [place] [address]     ROS:  Pertinent items noted in HPI and remainder of comprehensive ROS otherwise negative.  Physical Exam: Vitals:   05/11/18 0900 05/11/18 1200  BP: (!) 179/110 (!) 148/90  Pulse: 74 73  Resp: 16 16  Temp:    SpO2: 99% 98%     General exam: Appears calm and comfortable  Respiratory system: Clear to auscultation. Respiratory effort normal. No wheezing or crackle Cardiovascular system: S1 & S2 heard, RRR.  Bilateral lower extremities pitting edema. Gastrointestinal system: Abdomen is nondistended, soft and nontender. Normal bowel sounds heard. Central nervous system: Alert and oriented. No focal neurological deficits. Extremities: Symmetric 5 x 5 power. Skin: No rashes, lesions or ulcers Psychiatry: Judgement and insight appear normal. Mood & affect appropriate.   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 Normal renal function in 05/2017. 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  On admission patient had hypertensive urgency with systolic blood pressure more than 200s.  Patient has bilateral echogenic kidneys, anemia,  acidosis points towards underlying CKD. -Benazepril and Bactrim were listed as home medication however patient was not taking any medication at home. -I will check urinalysis.  Further serological test depending on UA.  Agree with restarting HIV medication.  Check PTH level. -He is now with fluid overload and possible CHF.  Currently on IV Lasix. -Given low CO2, I will start oral sodium bicarbonate. -start IV albumin x 3 doses. -Recommend to hold off on Bactrim, discussed with   primary team. -Monitor BMP, avoid nephrotoxins. I have discussed this with the  patient.  #Hypertension: Elevated blood pressure on admission due to noncompliant with his medication.  Blood pressure is better controlled now.  On BiDil and amlodipine.  # LE edema DOE: Echo with EF of 45 to 50% with mildly reduced systolic function.  Agree with IV Lasix.  #Anemia: GI evaluation ongoing because of fecal occult blood test positive.  Iron saturation 44%.  Hemoglobin 7.2.  I will order a dose of Aranesp    Prasad  05/11/2018, 12:28 PM  [facility].    

## 2018-05-12 ENCOUNTER — Inpatient Hospital Stay (HOSPITAL_COMMUNITY): Payer: Medicare Other

## 2018-05-12 DIAGNOSIS — R112 Nausea with vomiting, unspecified: Secondary | ICD-10-CM

## 2018-05-12 DIAGNOSIS — D649 Anemia, unspecified: Secondary | ICD-10-CM

## 2018-05-12 DIAGNOSIS — F1910 Other psychoactive substance abuse, uncomplicated: Secondary | ICD-10-CM

## 2018-05-12 DIAGNOSIS — I5041 Acute combined systolic (congestive) and diastolic (congestive) heart failure: Secondary | ICD-10-CM

## 2018-05-12 DIAGNOSIS — R0789 Other chest pain: Secondary | ICD-10-CM

## 2018-05-12 DIAGNOSIS — R195 Other fecal abnormalities: Secondary | ICD-10-CM

## 2018-05-12 LAB — BASIC METABOLIC PANEL
Anion gap: 10 (ref 5–15)
BUN: 66 mg/dL — AB (ref 6–20)
CALCIUM: 7.8 mg/dL — AB (ref 8.9–10.3)
CHLORIDE: 115 mmol/L — AB (ref 98–111)
CO2: 15 mmol/L — AB (ref 22–32)
CREATININE: 6.11 mg/dL — AB (ref 0.61–1.24)
GFR calc Af Amer: 11 mL/min — ABNORMAL LOW (ref >60)
GFR calc non Af Amer: 9 mL/min — ABNORMAL LOW (ref >60)
Glucose, Bld: 109 mg/dL — ABNORMAL HIGH (ref 70–99)
Potassium: 3.4 mmol/L — ABNORMAL LOW (ref 3.5–5.1)
SODIUM: 140 mmol/L (ref 135–145)

## 2018-05-12 LAB — TYPE AND SCREEN
ABO/RH(D): O POS
Antibody Screen: NEGATIVE
UNIT DIVISION: 0
Unit division: 0

## 2018-05-12 LAB — BPAM RBC
BLOOD PRODUCT EXPIRATION DATE: 201907252359
Blood Product Expiration Date: 201907252359
ISSUE DATE / TIME: 201906241959
ISSUE DATE / TIME: 201906250127
Unit Type and Rh: 5100
Unit Type and Rh: 5100

## 2018-05-12 LAB — T-HELPER CELLS (CD4) COUNT (NOT AT ARMC)
CD4 T CELL ABS: 50 /uL — AB (ref 400–2700)
CD4 T CELL HELPER: 6 % — AB (ref 33–55)

## 2018-05-12 LAB — PROTEIN / CREATININE RATIO, URINE
CREATININE, URINE: 48.68 mg/dL
Protein Creatinine Ratio: 4.29 mg/mg{Cre} — ABNORMAL HIGH (ref 0.00–0.15)
TOTAL PROTEIN, URINE: 209 mg/dL

## 2018-05-12 LAB — MAGNESIUM: Magnesium: 1.4 mg/dL — ABNORMAL LOW (ref 1.7–2.4)

## 2018-05-12 MED ORDER — PROMETHAZINE HCL 25 MG/ML IJ SOLN
12.5000 mg | Freq: Once | INTRAMUSCULAR | Status: AC
  Administered 2018-05-12: 12.5 mg via INTRAVENOUS
  Filled 2018-05-12: qty 1

## 2018-05-12 MED ORDER — SODIUM CHLORIDE 0.9 % IV SOLN
INTRAVENOUS | Status: DC | PRN
Start: 2018-05-12 — End: 2018-05-26
  Administered 2018-05-12: 12:00:00 via INTRAVENOUS

## 2018-05-12 MED ORDER — MAGNESIUM SULFATE 2 GM/50ML IV SOLN
2.0000 g | Freq: Once | INTRAVENOUS | Status: AC
  Administered 2018-05-12: 2 g via INTRAVENOUS
  Filled 2018-05-12: qty 50

## 2018-05-12 MED ORDER — DOLUTEGRAVIR SODIUM 50 MG PO TABS
50.0000 mg | ORAL_TABLET | Freq: Every day | ORAL | Status: DC
  Administered 2018-05-12 – 2018-05-26 (×15): 50 mg via ORAL
  Filled 2018-05-12 (×15): qty 1

## 2018-05-12 MED ORDER — POTASSIUM CHLORIDE CRYS ER 20 MEQ PO TBCR
40.0000 meq | EXTENDED_RELEASE_TABLET | Freq: Once | ORAL | Status: AC
  Administered 2018-05-12: 40 meq via ORAL
  Filled 2018-05-12: qty 2

## 2018-05-12 MED ORDER — DARUNAVIR-COBICISTAT 800-150 MG PO TABS
1.0000 | ORAL_TABLET | Freq: Every day | ORAL | Status: DC
  Administered 2018-05-12 – 2018-05-26 (×15): 1 via ORAL
  Filled 2018-05-12 (×16): qty 1

## 2018-05-12 MED ORDER — RILPIVIRINE HCL 25 MG PO TABS
25.0000 mg | ORAL_TABLET | Freq: Every day | ORAL | Status: DC
  Administered 2018-05-12 – 2018-05-16 (×5): 25 mg via ORAL
  Filled 2018-05-12 (×5): qty 1

## 2018-05-12 NOTE — Progress Notes (Signed)
Patient still refusing labs after explaining the importance of him needing to let lab draw them.  He is still upset from being stuck so many times from the ED.

## 2018-05-12 NOTE — Progress Notes (Signed)
DAILY PROGRESS NOTE   Patient Name: [name] Date of Encounter: 05/12/2018  Chief Complaint   Nausea, abdominal pain  Patient Profile   [name] is a 57 y.o. male with a hx of uncontrolled hypertension, HIV disease and noncompliance with antiviral therapy, T-cell lymphoma treated with radiation therapy, remote history of polysubstance abuse, tobacco and alcohol abuse, hip AVN s/p hip replacement who is being seen today for the evaluation of chest pain and shortness of breath at the request of Dr. Thereasa Solo.  Subjective   In's and out's suggest net positive yesterday, weight up 2 lbs. BP remains elevated- coreg started yesterday, on amlodipine and bidil. Creatinine has risen further to 6.11 (GFR remains around 10). Reasonable urine output. Vomited up his pills this am, complaining of abdominal pain.  Objective   Vitals:   05/12/18 0500 05/12/18 0600 05/12/18 0611 05/12/18 0837  BP:   (!) 156/100 (!) 169/107  Pulse: 79 75 78 77  Resp: '20 16 16 19  ' Temp:    98.2 F (36.8 C)  TempSrc:    Oral  SpO2: 100% 100% 98% 100%  Weight:      Height:        Intake/Output Summary (Last 24 hours) at 05/12/2018 0956 Last data filed at 05/12/2018 0902 Gross per 24 hour  Intake 1619.26 ml  Output 1300 ml  Net 319.26 ml   Filed Weights   05/10/18 2214 05/11/18 0416 05/12/18 0423  Weight: 186 lb 8.2 oz (84.6 kg) 186 lb 8.2 oz (84.6 kg) 188 lb 7.9 oz (85.5 kg)    Physical Exam   General appearance: alert and mild distress Neck: no carotid bruit, no JVD and thyroid not enlarged, symmetric, no tenderness/mass/nodules Lungs: diminished breath sounds bilaterally Heart: regular rate and rhythm Abdomen: mild TTP diffusely over the LLQ and RLQ, no rebound, +guarding Extremities: extremities normal, atraumatic, no cyanosis or edema Pulses: 2+ and symmetric Skin: Skin color, texture, turgor normal. No rashes or lesions Neurologic: Grossly normal Psych: In pain  Inpatient  Medications    Scheduled Meds: . amLODipine  10 mg Oral Daily  . azithromycin  1,200 mg Oral Weekly  . carvedilol  12.5 mg Oral BID WC  . darunavir-cobicistat  1 tablet Oral Q breakfast  . dolutegravir  50 mg Oral Q breakfast  . furosemide  40 mg Intravenous BID  . isosorbide-hydrALAZINE  1 tablet Oral TID  . rilpivirine  25 mg Oral Q breakfast  . senna-docusate  1 tablet Oral Daily  . sodium bicarbonate  1,300 mg Oral BID    Continuous Infusions:   PRN Meds: acetaminophen **OR** [DISCONTINUED] acetaminophen, bisacodyl, HYDROcodone-acetaminophen, labetalol, ondansetron **OR** ondansetron (ZOFRAN) IV, senna-docusate, traMADol   Labs   Results for orders placed or performed during the hospital encounter of 05/10/18 (from the past 48 hour(s))  Basic metabolic panel     Status: Abnormal   Collection Time: 05/10/18  2:24 PM  Result Value Ref Range   Sodium 143 135 - 145 mmol/L   Potassium 3.8 3.5 - 5.1 mmol/L   Chloride 117 (H) 101 - 111 mmol/L   CO2 15 (L) 22 - 32 mmol/L   Glucose, Bld 106 (H) 65 - 99 mg/dL   BUN 73 (H) 6 - 20 mg/dL   Creatinine, Ser 5.82 (H) 0.61 - 1.24 mg/dL   Calcium 7.5 (L) 8.9 - 10.3 mg/dL   GFR calc non Af Amer 10 (L) >60 mL/min   GFR calc Af Amer 11 (L) >60  mL/min    Comment: (NOTE) The eGFR has been calculated using the CKD EPI equation. This calculation has not been validated in all clinical situations. eGFR's persistently <60 mL/min signify possible Chronic Kidney Disease.    Anion gap 11 5 - 15    Comment: Performed at [facility] [address] [place], [place] [address]  CBC     Status: Abnormal   Collection Time: 05/10/18  2:24 PM  Result Value Ref Range   WBC 4.8 4.0 - 10.5 K/uL   RBC 2.72 (L) 4.22 - 5.81 MIL/uL   Hemoglobin 7.2 (L) 13.0 - 17.0 g/dL   HCT 22.9 (L) 39.0 - 52.0 %   MCV 84.2 78.0 - 100.0 fL   MCH 26.5 26.0 - 34.0 pg   MCHC 31.4 30.0 - 36.0 g/dL   RDW 17.3 (H) 11.5 - 15.5 %   Platelets 272 150 - 400 K/uL     Comment: Performed at [facility] [address], [place], [place] [address]  Brain natriuretic peptide     Status: Abnormal   Collection Time: 05/10/18  2:24 PM  Result Value Ref Range   B Natriuretic Peptide 788.2 (H) 0.0 - 100.0 pg/mL    Comment: Performed at [facility] [address] [place], [place] [address]  I-stat troponin, ED     Status: Abnormal   Collection Time: 05/10/18  2:57 PM  Result Value Ref Range   Troponin i, poc 0.11 (HH) 0.00 - 0.08 ng/mL   Comment NOTIFIED PHYSICIAN    Comment 3            Comment: Due to the release kinetics of cTnI, a negative result within the first hours of the onset of symptoms does not rule out myocardial infarction with certainty. If myocardial infarction is still suspected, repeat the test at appropriate intervals.   POC occult blood, ED Provider will collect     Status: Abnormal   Collection Time: 05/10/18  4:47 PM  Result Value Ref Range   Fecal Occult Bld POSITIVE (A) NEGATIVE  Type and screen [place]     Status: None   Collection Time: 05/10/18  5:06 PM  Result Value Ref Range   ABO/RH(D) O POS    Antibody Screen NEG    Sample Expiration 05/13/2018    Unit Number [id number]    Blood Component Type RED CELLS,LR    Unit division 00    Status of Unit ISSUED,FINAL    Transfusion Status OK TO TRANSFUSE    Crossmatch Result Compatible    Unit Number [id number]    Blood Component Type RED CELLS,LR    Unit division 00    Status of Unit ISSUED,FINAL    Transfusion Status OK TO TRANSFUSE    Crossmatch Result      Compatible Performed at [facility], [place] [address], [place], [place] [address]   ABO/Rh     Status: None   Collection Time: 05/10/18  5:06 PM  Result Value Ref Range   ABO/RH(D)      O POS Performed at [facility] [address], [place], [place] [address]   Comprehensive metabolic panel     Status: Abnormal   Collection Time: 05/10/18  5:49 PM  Result  Value Ref Range   Sodium 141 135 - 145 mmol/L   Potassium 3.8 3.5 - 5.1 mmol/L    Comment: SLIGHT HEMOLYSIS   Chloride 113 (H) 101 -  111 mmol/L   CO2 15 (L) 22 - 32 mmol/L   Glucose, Bld 102 (H) 65 - 99 mg/dL   BUN 75 (H) 6 - 20 mg/dL   Creatinine, Ser 5.91 (H) 0.61 - 1.24 mg/dL   Calcium 7.3 (L) 8.9 - 10.3 mg/dL   Total Protein 6.2 (L) 6.5 - 8.1 g/dL   Albumin 1.5 (L) 3.5 - 5.0 g/dL   AST 26 15 - 41 U/L   ALT 16 (L) 17 - 63 U/L   Alkaline Phosphatase 36 (L) 38 - 126 U/L   Total Bilirubin 0.5 0.3 - 1.2 mg/dL   GFR calc non Af Amer 10 (L) >60 mL/min   GFR calc Af Amer 11 (L) >60 mL/min    Comment: (NOTE) The eGFR has been calculated using the CKD EPI equation. This calculation has not been validated in all clinical situations. eGFR's persistently <60 mL/min signify possible Chronic Kidney Disease.    Anion gap 13 5 - 15    Comment: Performed at [facility] [address], [place], [place] [address]  Protime-INR     Status: None   Collection Time: 05/10/18  5:49 PM  Result Value Ref Range   Prothrombin Time 13.5 11.4 - 15.2 seconds   INR 1.04     Comment: Performed at [facility] [address] [place], [place] [address]  TSH     Status: None   Collection Time: 05/10/18  7:06 PM  Result Value Ref Range   TSH 4.213 0.350 - 4.500 uIU/mL    Comment: Performed by a 3rd Generation assay with a functional sensitivity of <=0.01 uIU/mL. Performed at [facility], [place] [address] [place], [place] [address]   Troponin I     Status: Abnormal   Collection Time: 05/10/18  7:06 PM  Result Value Ref Range   Troponin I 0.15 (HH) <0.03 ng/mL    Comment: CRITICAL RESULT CALLED TO, READ BACK BY AND VERIFIED WITH: Marlinda Mike RN 2011 05/10/2018 WBOND Performed at [facility], [place] [address] [place], [place] [address]   Ethanol     Status: None   Collection Time: 05/10/18  7:06 PM  Result Value Ref Range   Alcohol, Ethyl (B) <10 <10 mg/dL    Comment:  (NOTE) Lowest detectable limit for serum alcohol is 10 mg/dL. For medical purposes only. Performed at [facility], [place] [address] [place], [place] [address]   Vitamin B12     Status: None   Collection Time: 05/10/18  7:06 PM  Result Value Ref Range   Vitamin B-12 443 180 - 914 pg/mL    Comment: (NOTE) This assay is not validated for testing neonatal or myeloproliferative syndrome specimens for Vitamin B12 levels. Performed at [facility], [place] [address], [place], [place] [address]   Iron and TIBC     Status: Abnormal   Collection Time: 05/10/18  7:06 PM  Result Value Ref Range   Iron 83 45 - 182 ug/dL   TIBC 190 (L) 250 - 450 ug/dL   Saturation Ratios 44 (H) 17.9 - 39.5 %   UIBC 107 ug/dL    Comment: Performed at [facility] [address], [place],  [address]  Ferritin     Status: Abnormal   Collection Time: 05/10/18  7:06 PM  Result Value Ref Range   Ferritin 1,380 (H) 24 - 336 ng/mL    Comment: Performed at [facility],  [address], [place], [place] [address]  Reticulocytes     Status: Abnormal   Collection Time: 05/10/18  7:06 PM  Result Value Ref Range   Retic Ct Pct 1.3 0.4 - 3.1 %   RBC. 2.69 (L) 4.22 - 5.81 MIL/uL   Retic Count, Absolute 35.0 19.0 - 186.0 K/uL    Comment: Performed at [facility] [address] [place], [place] [address]  Prepare RBC     Status: None   Collection Time: 05/10/18  7:35 PM  Result Value Ref Range   Order Confirmation      ORDER PROCESSED BY BLOOD BANK Performed at [facility], [place] [address] [place], [place] [address]   Troponin I     Status: Abnormal   Collection Time: 05/10/18 10:25 PM  Result Value Ref Range   Troponin I 0.18 (HH) <0.03 ng/mL    Comment: CRITICAL VALUE NOTED.  VALUE IS CONSISTENT WITH PREVIOUSLY REPORTED AND CALLED VALUE. Performed at [facility], [place] [address], [place], [place] [address]   MRSA PCR Screening     Status: None   Collection Time:  05/10/18 11:00 PM  Result Value Ref Range   MRSA by PCR NEGATIVE NEGATIVE    Comment:        The GeneXpert MRSA Assay (FDA approved for NASAL specimens only), is one component of a comprehensive MRSA colonization surveillance program. It is not intended to diagnose MRSA infection nor to guide or monitor treatment for MRSA infections. Performed at [facility], [place] [address] [place], [place] [address]   Urine rapid drug screen (hosp performed)     Status: Abnormal   Collection Time: 05/11/18  6:06 AM  Result Value Ref Range   Opiates NONE DETECTED NONE DETECTED   Cocaine NONE DETECTED NONE DETECTED   Benzodiazepines NONE DETECTED NONE DETECTED   Amphetamines NONE DETECTED NONE DETECTED   Tetrahydrocannabinol POSITIVE (A) NONE DETECTED   Barbiturates (A) NONE DETECTED    Result not available. Reagent lot number recalled by manufacturer.    Comment: Performed at [facility], [place] [address], [place], [place] [address]  Urinalysis, Routine w reflex microscopic     Status: Abnormal   Collection Time: 05/11/18  6:06 AM  Result Value Ref Range   Color, Urine YELLOW YELLOW   APPearance HAZY (A) CLEAR   Specific Gravity, Urine 1.012 1.005 - 1.030   pH 5.0 5.0 - 8.0   Glucose, UA NEGATIVE NEGATIVE mg/dL   Hgb urine dipstick MODERATE (A) NEGATIVE   Bilirubin Urine NEGATIVE NEGATIVE   Ketones, ur NEGATIVE NEGATIVE mg/dL   Protein, ur >=300 (A) NEGATIVE mg/dL   Nitrite NEGATIVE NEGATIVE   Leukocytes, UA NEGATIVE NEGATIVE   RBC / HPF 11-20 0 - 5 RBC/hpf   WBC, UA 6-10 0 - 5 WBC/hpf   Bacteria, UA NONE SEEN NONE SEEN   Squamous Epithelial / LPF 0-5 0 - 5   Hyaline Casts, UA PRESENT     Comment: Performed at [facility], [place] [address] [place], [place] [address]  Na and K (sodium & potassium), rand urine     Status: None   Collection Time: 05/11/18  6:06 AM  Result Value Ref Range   Sodium, Ur 71 mmol/L   Potassium Urine 15 mmol/L    Comment: Performed at  [facility] [address], [place], [place] [address]  Renal function panel     Status:  Abnormal   Collection Time: 05/11/18  3:21 PM  Result Value Ref Range   Sodium 141 135 - 145 mmol/L   Potassium 3.9 3.5 - 5.1 mmol/L   Chloride 117 (H) 98 - 111 mmol/L    Comment: Please note change in reference range.   CO2 13 (L) 22 - 32 mmol/L   Glucose, Bld 137 (H) 70 - 99 mg/dL    Comment: Please note change in reference range.   BUN 71 (H) 6 - 20 mg/dL    Comment: Please note change in reference range.   Creatinine, Ser 5.92 (H) 0.61 - 1.24 mg/dL   Calcium 7.5 (L) 8.9 - 10.3 mg/dL   Phosphorus 7.2 (H) 2.5 - 4.6 mg/dL   Albumin 1.8 (L) 3.5 - 5.0 g/dL   GFR calc non Af Amer 10 (L) >60 mL/min   GFR calc Af Amer 11 (L) >60 mL/min    Comment: (NOTE) The eGFR has been calculated using the CKD EPI equation. This calculation has not been validated in all clinical situations. eGFR's persistently <60 mL/min signify possible Chronic Kidney Disease.    Anion gap 11 5 - 15    Comment: Performed at [facility] [address], [place], [place] [address]    ECG   N/A  Telemetry   Sinus rhythm - Personally Reviewed  Radiology    Dg Chest 2 View  Result Date: 05/10/2018 CLINICAL DATA:  Short of breath EXAM: CHEST - 2 VIEW COMPARISON:  12/26/2009 FINDINGS: The heart size and mediastinal contours are within normal limits. Both lungs are clear. The visualized skeletal structures are unremarkable. IMPRESSION: No active cardiopulmonary disease. Electronically Signed   By: Franchot Gallo M.D.   On: 05/10/2018 15:24   US Renal  Result Date: 05/10/2018 CLINICAL DATA:  AKI, elevated BUN and creatinine. EXAM: RENAL / URINARY TRACT ULTRASOUND COMPLETE COMPARISON:  None. FINDINGS: Right Kidney: Length: 12 cm. RIGHT renal cortex is diffusely echogenic. Probable small cyst measuring 12 mm. No suspicious cortical mass or hydronephrosis seen. Left Kidney: Length: 13.5 cm. LEFT renal cortex is  also diffusely echogenic. No suspicious mass or hydronephrosis seen. Bladder: Appears normal for degree of bladder distention. IMPRESSION: 1. Both kidneys are diffusely echogenic indicating medical renal disease. No suspicious mass or hydronephrosis within either kidney. 2. Bladder is unremarkable, partially decompressed. Electronically Signed   By: Franki Cabot M.D.   On: 05/10/2018 21:15    Cardiac Studies   N/A  Assessment   1. Active Problems: 2.   Human immunodeficiency virus (HIV) disease ([facility]) 3.   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   Hypertension 5.   Chest pain 6.   Tobacco use 7.   Furuncle of gluteal region 8.   CHF (congestive heart failure) ([place]) 9.   Hypertensive urgency 10.   GIB (gastrointestinal bleeding) 11.   AKI (acute kidney injury) ([place]) 12.   Heme + stool 13.   Normocytic anemia 14.   Plan   1. C/o abdominal pain today, nausea - threw up his pills - BP elevated. Seems to be diuresing , swelling improved, but ?net positive - weight up. Creatinine worse, but GFR around 10. Appreciate renal recs. Recommend abd flat plate x-ray to start, to r/o ileus or obstruction. Will d/w Dr. Sherral Hammers. Continue diuresis - will hold off on stress testing for a few days, since he is slow to improve.  Time Spent Directly with Patient:  I have spent a  total of 25 minutes with the patient reviewing hospital notes, telemetry, EKGs, labs and examining the patient as well as establishing an assessment and plan that was discussed personally with the patient. > 50% of time was spent in direct patient care.  Length of Stay:  LOS: 2 days   Pixie Casino, MD, [facility], [place] Director of the [facility] Lipid Disorders &  Cardiovascular Risk Reduction Clinic Diplomate of the American Board of Clinical Lipidology Attending Cardiologist  Direct Dial: [phone]  Fax: [phone]  Website:   [email or web address] Kae Lauman 05/12/2018, 9:56 AM

## 2018-05-12 NOTE — Care Management Note (Addendum)
Case Management Note  Patient Details  Name: [name] MRN: [id number] Date of Birth: 03/28/1961  Subjective/Objective:    From [facility], says he can not afford his medications, he has Medicare , states he does not feel like talking right now, for NCM to speak with him later.    7/2 [name] RN, BSN- patient discussed in LOS , per Med Director , patient would  Be good candidate for [facility].  NCM will have [facility] reps look at today. ID is also aware of [facility] , Id Md  was also in LOS.  NCM confirmed with attending as well.   Per [facility] rep they do not have any HD beds available, and [name] states that patient does not have a 3 night sdu stay and she will need the revenue codes.   Tunneled HD catheter placed on 6/30 and HD started.  Hopeful for renal recovery if not will need ongoing HD, conts on iv abx, iv lasix.          Action/Plan: NCM will follow for dc needs.  Expected Discharge Date:  05/13/18               Expected Discharge Plan:  Home/Self Care  In-House Referral:     Discharge planning Services  CM Consult  Post Acute Care Choice:    Choice offered to:     DME Arranged:    DME Agency:     HH Arranged:    HH Agency:     Status of Service:  In process, will continue to follow  If discussed at Long Length of Stay Meetings, dates discussed:    Additional Comments:  [name] Mayo, RN 05/12/2018, 9:26 AM

## 2018-05-12 NOTE — Progress Notes (Addendum)
[facility] NEPHROLOGY PROGRESS NOTE  Assessment/ Plan: Pt is a 57 y.o. yo male with history of uncontrolled hypertension, HIV, T-cell lymphoma, polysubstance abuse, noncompliant with the medication including HIV medications presented from ID clinic for chest pain, shortness of breath and lower extremity edema.  Consulted for renal failure.  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 Normal renal function in 05/2017. 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  On admission patient had hypertensive urgency with systolic blood pressure more than 200s.  Patient has bilateral echogenic kidneys, anemia,  acidosis points towards underlying CKD. -Benazepril and Bactrim were listed as home medication however patient was not taking any medication at home. -Urinalysis with protein, RBCs and WBCs.  I will quantify proteinuria.  Agree with HIV medication.  Follow-up PTH level.  Check acute hepatitis panel. -Receiving IV Lasix for the management of lower extremity edema and CHF.  Urine output recorded as 950 cc.  Serum creatinine level worsening to 6.1.  Serum CO2 level 15, continue sodium bicarbonate pills -Serum albumin level is very low, received 3 doses of albumin. -Monitor BMP, urine output.  If no improvement in renal function, patient may need renal replacement therapy. -Avoid nephrotoxins.  #Hypertension: Elevated blood pressure on admission due to noncompliant with his medication.  Blood pressure is better controlled now.  On BiDil and amlodipine.  # LE edema DOE: Echo with EF of 45 to 50% with mildly reduced systolic function.    Cardiology evaluation ongoing.  Lower extremity edema is improving.  #Anemia: GI evaluation ongoing because of fecal occult blood test positive.  Iron saturation 44%.    Hemoglobin was  7.2.  Received dose of Aranesp on 6/25.  #Hypomagnesemia: Replete mag sulfate.  Monitor labs.  Potassium level borderline due to diuretics.  Order a dose of KCl.  Subjective: Seen and examined at bedside.  Upset about blood draws.  Patient was educated.  No chest pain or shortness of breath. Objective Vital signs in last 24 hours: Vitals:   05/12/18 0500 05/12/18 0600 05/12/18 0611 05/12/18 0837  BP:   (!) 156/100 (!) 169/107  Pulse: 79 75 78 77  Resp: 20 16 16 19   Temp:    98.2 F (36.8 C)  TempSrc:    Oral  SpO2: 100% 100% 98% 100%  Weight:      Height:       Weight change: 9.749 kg (21 lb 7.9 oz)  Intake/Output Summary (Last 24 hours) at 05/12/2018 1035 Last data filed at 05/12/2018 0902 Gross per 24 hour  Intake 1619.26 ml  Output 1300 ml  Net 319.26 ml       Labs: Basic Metabolic Panel: Recent Labs  Lab 05/10/18 1749 05/11/18 1521 05/12/18 0902  NA 141 141 140  K 3.8 3.9 3.4*  CL 113* 117* 115*  CO2 15* 13* 15*  GLUCOSE 102* 137* 109*  BUN 75* 71* 66*  CREATININE 5.91* 5.92* 6.11*  CALCIUM 7.3* 7.5* 7.8*  PHOS  --  7.2*  --    Liver Function Tests: Recent Labs  Lab 05/10/18 1749 05/11/18 1521  AST 26  --   ALT 16*  --   ALKPHOS 36*  --   BILITOT 0.5  --   PROT 6.2*  --   ALBUMIN 1.5* 1.8*   No results for input(s): LIPASE, AMYLASE in the last 168 hours. No results for input(s):  AMMONIA in the last 168 hours. CBC: Recent Labs  Lab 05/10/18 1424  WBC 4.8  HGB 7.2*  HCT 22.9*  MCV 84.2  PLT 272   Cardiac Enzymes: Recent Labs  Lab 05/10/18 1906 05/10/18 2225  TROPONINI 0.15* 0.18*   CBG: No results for input(s): GLUCAP in the last 168 hours.  Iron Studies:  Recent Labs    05/10/18 1906  IRON 83  TIBC 190*  FERRITIN 1,380*   Studies/Results: Dg Chest 2 View  Result Date: 05/10/2018 CLINICAL DATA:  Short of breath EXAM: CHEST - 2 VIEW COMPARISON:  12/26/2009 FINDINGS: The heart size and mediastinal contours are within normal  limits. Both lungs are clear. The visualized skeletal structures are unremarkable. IMPRESSION: No active cardiopulmonary disease. Electronically Signed   By: Franchot Gallo M.D.   On: 05/10/2018 15:24   US Renal  Result Date: 05/10/2018 CLINICAL DATA:  AKI, elevated BUN and creatinine. EXAM: RENAL / URINARY TRACT ULTRASOUND COMPLETE COMPARISON:  None. FINDINGS: Right Kidney: Length: 12 cm. RIGHT renal cortex is diffusely echogenic. Probable small cyst measuring 12 mm. No suspicious cortical mass or hydronephrosis seen. Left Kidney: Length: 13.5 cm. LEFT renal cortex is also diffusely echogenic. No suspicious mass or hydronephrosis seen. Bladder: Appears normal for degree of bladder distention. IMPRESSION: 1. Both kidneys are diffusely echogenic indicating medical renal disease. No suspicious mass or hydronephrosis within either kidney. 2. Bladder is unremarkable, partially decompressed. Electronically Signed   By: Franki Cabot M.D.   On: 05/10/2018 21:15    Medications: Infusions:   Scheduled Medications: . amLODipine  10 mg Oral Daily  . azithromycin  1,200 mg Oral Weekly  . carvedilol  12.5 mg Oral BID WC  . darunavir-cobicistat  1 tablet Oral Q breakfast  . dolutegravir  50 mg Oral Q breakfast  . furosemide  40 mg Intravenous BID  . isosorbide-hydrALAZINE  1 tablet Oral TID  . promethazine  12.5 mg Intravenous Once  . rilpivirine  25 mg Oral Q breakfast  . senna-docusate  1 tablet Oral Daily  . sodium bicarbonate  1,300 mg Oral BID    have reviewed scheduled and prn medications.  Physical Exam: General: Not in distress, comfortable Heart: Regular rate rhythm S1-S2 normal Lungs: Clear bilateral, no crackles Abdomen:, Nontender, nondistended Extremities: Bilateral lower extremity 1+ edema.   Roux Brandy Prasad Marylen Zuk 05/12/2018,10:35 AM  LOS: 2 days

## 2018-05-12 NOTE — Progress Notes (Addendum)
Pt c/o of nausea/vomiting as well as abdominal pain.  Points to LLQ for abdominal pain.  Went to give Zofran and has just had at [phone].  Asked MD for IV pain medication and discussed case with attending and cardiology.  K and Mg low.  Both MD's made aware.  Text paged MD for extra dose of Zofran now as only rec'd 4 mg previously.  Will continue to monitor.

## 2018-05-12 NOTE — Progress Notes (Signed)
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 [name] w/ right-sided chest pain with radiation to the right arm, nausea, and SOB after walking a short distance.  He also reported B LE swelling, paroxysmal nocturnal dyspnea, and bright red blood per rectum for a weeks duration.      Subjective: 6/26 A/O x4, negative CP, negative S OB.  States has abdominal pain but appears very comfortable.  Negative nausea.  States the only reason he has been noncompliant with his medication is because he could not afford it.  Per patient has informed ID of this problem and no help forthcoming from ID.     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  Per patient secondary to cost. - 6/26 consult to case management and social work resources for patient to help pay for HIV medication placed. -Viral load was 80K w/ CD4 80 June 2018 -Patient is HIV medication restarted.  ID on board -Azithromycin 1200 mg weekly -Prezcobix 800-150 mg daily -Tivicay 50 mg daily -Edurant 25 mg daily  Acute renal failure - Multifactorial hypertensive nephropathy, HIV nephropathy.   -Discussed case with nephrology Dr. Carolin Sicks unsure whether patient's renal function will improve.  Considering if patient should be started on HD if renal function continues to  deteriorate given his noncompliance.  Short-term HD may be appropriate however do not believe patient would be a good candidate for long-term HD given his poor compliance.  Dr. Carolin Sicks believes that patient's renal function may improve with resumption of his HIV medication. - Avoid nephrotoxic medication Recent Labs  Lab 05/10/18 1424 05/10/18 1749 05/11/18 1521 05/12/18 0902  CREATININE 5.82* 5.91* 5.92* 2.72*    Acute systolic and diastolic CHF. -Strict in and out -Daily weight - Transfuse for hemoglobin<8 -Cardiology on board -Amlodipine 10 mg daily -Coreg 12.5 mg BID - Lasix 20 mg BID   Uncontrolled essential HTN - Multifactorial noncompliance with medication, volume overload. -See CHF   GI bleed/BRBPR -Explained at length to patient while colonoscopy would be necessary given his symptoms and age.  Currently patient refuses colonoscopy. -GI seeing patient   Normocytic anemia -Anemia panel consistent with anemia of chronic disease.  Suspect a combination of AIDS +Renal disease does not appear to have significant GI bleed. 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  Culture not sent   Polysubstance abuse - 6/25 UDS positive marijuana     Goal of care -6/26 PALLIATIVE CARE: Noncompliant patient with HIV, acute systolic CHF, acute renal failure possibly going on HD discussed change of status to DNR, discussed short-term vs long-term goals of care     DVT prophylaxis: SCD Code Status: Full Family Communication: None  Disposition Plan: TBD   Consultants:  Cardiology ID Nephrology GI   Procedures/Significant Events:  6/25 echocardiogram:Left ventricle: LVEF =45% to 50%. Diffuse hypokinesis.- Grade 1   diastolic dysfunction. - Left atrium:  moderately dilated.    I have personally reviewed and interpreted all radiology studies and my findings are as above.  VENTILATOR SETTINGS:    Cultures 6/24  MRSA PCR negative  Antimicrobials: Anti-infectives (From admission, onward)   Start     Stop   05/12/18 0730  darunavir-cobicistat (PREZCOBIX) 800-150 MG per tablet 1 tablet         05/12/18 0730  dolutegravir (TIVICAY) tablet 50 mg         05/12/18 0730  rilpivirine (EDURANT) tablet 25 mg         05/12/18 0700  dolutegravir (TIVICAY) tablet 50 mg  Status:  Discontinued     05/12/18 0047   05/12/18 0700  rilpivirine (EDURANT) tablet 25 mg  Status:  Discontinued     05/12/18 0047   05/12/18 0700  darunavir-cobicistat (PREZCOBIX) 800-150 MG per tablet 1 tablet  Status:  Discontinued     05/12/18 0047   05/11/18 1015  azithromycin (ZITHROMAX) tablet 1,200 mg         05/11/18 1015  sulfamethoxazole-trimethoprim (BACTRIM,SEPTRA) 400-80 MG per tablet 1 tablet  Status:  Discontinued     05/11/18 1228   05/11/18 0800  elvitegravir-cobicistat-emtricitabine-tenofovir (GENVOYA) 150-150-200-10 MG tablet 1 tablet  Status:  Discontinued     05/10/18 1931   05/11/18 0800  darunavir (PREZISTA) tablet 800 mg  Status:  Discontinued     05/10/18 1931       Devices   LINES / TUBES:      Continuous Infusions:   Objective: Vitals:   05/12/18 0423 05/12/18 0500 05/12/18 0600 05/12/18 0611  BP:    (!) 156/100  Pulse:  79 75 78  Resp:  20 16 16   Temp:      TempSrc:      SpO2:  100% 100% 98%  Weight: 188 lb 7.9 oz (85.5 kg)     Height:        Intake/Output Summary (Last 24 hours) at 05/12/2018 9622 Last data filed at 05/12/2018 0600 Gross per 24 hour  Intake 1983.01 ml  Output 950 ml  Net 1033.01 ml   Filed Weights   05/10/18 2214 05/11/18 0416 05/12/18 0423  Weight: 186 lb 8.2 oz (84.6 kg) 186 lb 8.2 oz (84.6 kg) 188 lb 7.9 oz (85.5 kg)    Examination:  General: A/O x4, No acute respiratory distress Neck:  Negative scars, masses, torticollis, lymphadenopathy, JVD Lungs: Clear to auscultation bilaterally without wheezes or crackles Cardiovascular: Regular rate and rhythm  without murmur gallop or rub normal S1 and S2 Abdomen: negative abdominal pain, nondistended, positive soft, bowel sounds, no rebound, no ascites, no appreciable mass Extremities: No significant cyanosis, clubbing, or edema bilateral lower extremities Skin: Boil on the right ureter buttocks at the crack scab in place Psychiatric:  Negative depression, negative anxiety, negative fatigue, negative mania, extremely poor understanding of multiple medical problems Central nervous system:  Cranial nerves II through XII intact, tongue/uvula midline, all extremities muscle strength 5/5, sensation intact throughout,  negative dysarthria, negative expressive aphasia, negative receptive aphasia.  .     Data Reviewed: Care during the described time interval was provided by me .  I have reviewed this patient's available data, including medical history, events of note, physical examination, and all test results as part of my  evaluation.   CBC: Recent Labs  Lab 05/10/18 1424  WBC 4.8  HGB 7.2*  HCT 22.9*  MCV 84.2  PLT 097   Basic Metabolic Panel: Recent Labs  Lab 05/10/18 1424 05/10/18 1749 05/11/18 1521  NA 143 141 141  K 3.8 3.8 3.9  CL 117* 113* 117*  CO2 15* 15* 13*  GLUCOSE 106* 102* 137*  BUN 73* 75* 71*  CREATININE 5.82* 5.91* 5.92*  CALCIUM 7.5* 7.3* 7.5*  PHOS  --   --  7.2*   GFR: Estimated Creatinine Clearance: 14 mL/min (A) (by C-G formula based on SCr of 5.92 mg/dL (H)). Liver Function Tests: Recent Labs  Lab 05/10/18 1749 05/11/18 1521  AST 26  --   ALT 16*  --   ALKPHOS 36*  --   BILITOT 0.5  --   PROT 6.2*  --   ALBUMIN 1.5* 1.8*   No results for input(s): LIPASE, AMYLASE in the last 168 hours. No results for input(s): AMMONIA in the last 168 hours. Coagulation Profile: Recent Labs  Lab 05/10/18 1749  INR 1.04   Cardiac Enzymes: Recent Labs  Lab 05/10/18 1906 05/10/18 2225  TROPONINI 0.15* 0.18*   BNP (last 3 results) No results for input(s):  PROBNP in the last 8760 hours. HbA1C: No results for input(s): HGBA1C in the last 72 hours. CBG: No results for input(s): GLUCAP in the last 168 hours. Lipid Profile: No results for input(s): CHOL, HDL, LDLCALC, TRIG, CHOLHDL, LDLDIRECT in the last 72 hours. Thyroid Function Tests: Recent Labs    05/10/18 1906  TSH 4.213   Anemia Panel: Recent Labs    05/10/18 1906  VITAMINB12 443  FERRITIN 1,380*  TIBC 190*  IRON 83  RETICCTPCT 1.3   Urine analysis:    Component Value Date/Time   COLORURINE YELLOW 05/11/2018 0606   APPEARANCEUR HAZY (A) 05/11/2018 0606   LABSPEC 1.012 05/11/2018 0606   PHURINE 5.0 05/11/2018 0606   GLUCOSEU NEGATIVE 05/11/2018 0606   GLUCOSEU NEG mg/dL 11/23/2006 2036   HGBUR MODERATE (A) 05/11/2018 0606   BILIRUBINUR NEGATIVE 05/11/2018 0606   KETONESUR NEGATIVE 05/11/2018 0606   PROTEINUR >=300 (A) 05/11/2018 0606   UROBILINOGEN 0.2 11/23/2006 2036   NITRITE NEGATIVE 05/11/2018 0606   LEUKOCYTESUR NEGATIVE 05/11/2018 0606   Sepsis Labs: @LABRCNTIP (procalcitonin:4,lacticidven:4)  ) Recent Results (from the past 240 hour(s))  MRSA PCR Screening     Status: None   Collection Time: 05/10/18 11:00 PM  Result Value Ref Range Status   MRSA by PCR NEGATIVE NEGATIVE Final    Comment:        The GeneXpert MRSA Assay (FDA approved for NASAL specimens only), is one component of a comprehensive MRSA colonization surveillance program. It is not intended to diagnose MRSA infection nor to guide or monitor treatment for MRSA infections. Performed at [facility], [place] [address], [place], [place] [address]          Radiology Studies: Dg Chest 2 View  Result Date: 05/10/2018 CLINICAL DATA:  Short of breath EXAM: CHEST - 2 VIEW COMPARISON:  12/26/2009 FINDINGS: The heart size and mediastinal contours are within normal limits. Both lungs are clear. The visualized skeletal structures are unremarkable. IMPRESSION: No active cardiopulmonary  disease. Electronically Signed   By: Franchot Gallo M.D.   On: 05/10/2018 15:24   US Renal  Result Date: 05/10/2018 CLINICAL DATA:  AKI, elevated BUN and creatinine. EXAM: RENAL / URINARY TRACT ULTRASOUND COMPLETE COMPARISON:  None. FINDINGS: Right Kidney: Length:  12 cm. RIGHT renal cortex is diffusely echogenic. Probable small cyst measuring 12 mm. No suspicious cortical mass or hydronephrosis seen. Left Kidney: Length: 13.5 cm. LEFT renal cortex is also diffusely echogenic. No suspicious mass or hydronephrosis seen. Bladder: Appears normal for degree of bladder distention. IMPRESSION: 1. Both kidneys are diffusely echogenic indicating medical renal disease. No suspicious mass or hydronephrosis within either kidney. 2. Bladder is unremarkable, partially decompressed. Electronically Signed   By: Franki Cabot M.D.   On: 05/10/2018 21:15        Scheduled Meds: . amLODipine  10 mg Oral Daily  . azithromycin  1,200 mg Oral Weekly  . carvedilol  12.5 mg Oral BID WC  . darunavir-cobicistat  1 tablet Oral Q breakfast  . dolutegravir  50 mg Oral Q breakfast  . furosemide  40 mg Intravenous BID  . isosorbide-hydrALAZINE  1 tablet Oral TID  . rilpivirine  25 mg Oral Q breakfast  . senna-docusate  1 tablet Oral Daily  . sodium bicarbonate  1,300 mg Oral BID   Continuous Infusions:   LOS: 2 days    Time spent: 40 minutes    Tressa Maldonado, Geraldo Docker, MD [facility] Pager [phone]   If 7PM-7AM, please contact night-coverage [email or web address] Password [email or web address] 05/12/2018, 8:23 AM

## 2018-05-12 NOTE — Progress Notes (Addendum)
Daily Rounding Note  05/12/2018, 12:36 PM  LOS: 2 days   SUBJECTIVE:   Chief complaint: Patient has developed nausea and mid abdominal pain overnight, these continue continue this morning and this afternoon.  Around breakfast time he had taken his medications, including his new antiretrovirals.  He proceeded to throw up.  Currently he sitting in front of a tray of solid food which he ate the bulk of and a emesis basin is sitting in front of him because he is got nauseated again.  Emesis did not contain any dark material or blood. He is had a brown stool yesterday this morning, there was no blood on wiping up afterwards.  Continues to have scant blood coming from the perirectal region He is getting multiple new oral meds. First dose of antiretrovirals was this morning  OBJECTIVE:         Vital signs in last 24 hours:    Temp:  [97.7 F (36.5 C)-98.8 F (37.1 C)] 98.1 F (36.7 C) (06/26 1147) Pulse Rate:  [33-99] 77 (06/26 0837) Resp:  [14-26] 19 (06/26 0837) BP: (146-177)/(78-108) 169/107 (06/26 0837) SpO2:  [95 %-100 %] 100 % (06/26 0837) Weight:  [188 lb 7.9 oz (85.5 kg)] 188 lb 7.9 oz (85.5 kg) (06/26 0423) Last BM Date: 05/11/18 Filed Weights   05/10/18 2214 05/11/18 0416 05/12/18 0423  Weight: 186 lb 8.2 oz (84.6 kg) 186 lb 8.2 oz (84.6 kg) 188 lb 7.9 oz (85.5 kg)   General: Patient does not look acutely ill.  Looks comfortable.  Sitting up in bed. Heart: RRR.  No MRG.  S1, S2 present Chest: Clear bilaterally.  No cough.  No labored breathing Abdomen: Soft, active bowel sounds.  Slight tenderness in the right mid abdomen without guarding or rebound. Extremities: Bilateral lower extremity edema. Neuro/Psych: Alert.  Oriented x3.  No tremor.  No obvious deficits or weakness.  Intake/Output from previous day: 06/25 0701 - 06/26 0700 In: 1983 [P.O.:1440; I.V.:123.8; IV Piggyback:419.3] Out: 950  [Urine:950]  Intake/Output this shift: Total I/O In: -  Out: 350 [Urine:350]  Lab Results: Recent Labs    05/10/18 1424  WBC 4.8  HGB 7.2*  HCT 22.9*  PLT 272   BMET Recent Labs    05/10/18 1749 05/11/18 1521 05/12/18 0902  NA 141 141 140  K 3.8 3.9 3.4*  CL 113* 117* 115*  CO2 15* 13* 15*  GLUCOSE 102* 137* 109*  BUN 75* 71* 66*  CREATININE 5.91* 5.92* 6.11*  CALCIUM 7.3* 7.5* 7.8*   LFT Recent Labs    05/10/18 1749 05/11/18 1521  PROT 6.2*  --   ALBUMIN 1.5* 1.8*  AST 26  --   ALT 16*  --   ALKPHOS 36*  --   BILITOT 0.5  --    PT/INR Recent Labs    05/10/18 1749  LABPROT 13.5  INR 1.04   Hepatitis Panel No results for input(s): HEPBSAG, HCVAB, HEPAIGM, HEPBIGM in the last 72 hours.  Studies/Results: Dg Chest 2 View  Result Date: 05/10/2018 CLINICAL DATA:  Short of breath EXAM: CHEST - 2 VIEW COMPARISON:  12/26/2009 FINDINGS: The heart size and mediastinal contours are within normal limits. Both lungs are clear. The visualized skeletal structures are unremarkable. IMPRESSION: No active cardiopulmonary disease. Electronically Signed   By: Franchot Gallo M.D.   On: 05/10/2018 15:24   US Renal  Result Date: 05/10/2018 CLINICAL DATA:  AKI, elevated BUN and creatinine. EXAM: RENAL /  URINARY TRACT ULTRASOUND COMPLETE COMPARISON:  None. FINDINGS: Right Kidney: Length: 12 cm. RIGHT renal cortex is diffusely echogenic. Probable small cyst measuring 12 mm. No suspicious cortical mass or hydronephrosis seen. Left Kidney: Length: 13.5 cm. LEFT renal cortex is also diffusely echogenic. No suspicious mass or hydronephrosis seen. Bladder: Appears normal for degree of bladder distention. IMPRESSION: 1. Both kidneys are diffusely echogenic indicating medical renal disease. No suspicious mass or hydronephrosis within either kidney. 2. Bladder is unremarkable, partially decompressed. Electronically Signed   By: Franki Cabot M.D.   On: 05/10/2018 21:15   Dg Abd Portable  1v  Result Date: 05/12/2018 CLINICAL DATA:  Nausea, vomiting, and diarrhea today. Also left lower quadrant pain. History of HIV, lymphoma, CHF. EXAM: PORTABLE ABDOMEN - 1 VIEW COMPARISON:  Abdominal and pelvic CT scan of May 20, 2017 FINDINGS: The bowel gas pattern is within the limits of normal. There are no abnormal soft tissue calcifications. 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 The lumbar spine and visualized portions of the pelvis are unremarkable. IMPRESSION: No acute intra-abdominal or pelvic abnormality is observed. Electronically Signed   By: David  Martinique M.D.   On: 05/12/2018 12:34    ASSESMENT:   *   Rectal bleeding versus bleeding from perirectal lesions  *    Normocytic anemia.  Not iron, B12 deficient  *    Nausea, non-bloody emesis.  Abdominal pain.  This may be side effect of new medications, specifically Prezista and Prezcobix.    *   Perirectal lesions.  Diagnosed as boils in the past.  ?hidradenitis?, ?  Fistula arising disease?.  CT scan 1 year ago did show distal ileal thickening.  Also has remote history of T-cell lymphoma of the jaw.  *    Poorly controlled HIV.  Started antiretrovirals this morning.  *    AKI.  Suspect some level of CKD.  Etiology includes many possible causes including HIV nephropathy, hypertensive glomerulosclerosis and CHF. Nephrology treated him with IV albumin and has initiated oral sodium bicarb.  *    Atypical chest pain.  Slight elevation in troponins.  Lower extremity edema.  Out-of-control hypertension. Hypertensive cardiomyopathy, EF 45 -50%.  Global hypokinesis. Responding well to diuretics. Cardiology planning [facility], perhaps tomorrow.   PLAN   *   Eventually should undergo colonoscopic evaluation given the rectal bleeding as well as his age 57 and no prior colonoscopic screening.  *  Check lipase and LFTs to r/o pancreatitis though medication induced pancreatitis would not normally occur so quickly.Azucena Freed  05/12/2018, 12:36 PM Phone [phone]    [place] Attending   I have taken an interval history, reviewed the chart and examined the patient. I agree with the Advanced Practitioner's note, impression and recommendations.   I think cross sectional imaging like CT abd/pelvis with only oral contrast will be next step  Gatha Mayer, MD, [facility] 05/12/2018 7:48 PM

## 2018-05-12 NOTE — Progress Notes (Signed)
[place] for Infectious Disease    Date of Admission:  05/10/2018      ID: [name] is a 57 y.o. 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)   Heme + stool   Normocytic anemia    Subjective: No longer having chest pain feeling better, had vomited his meds this morning. Having RLQ pain  Medications:  . amLODipine  10 mg Oral Daily  . azithromycin  1,200 mg Oral Weekly  . carvedilol  12.5 mg Oral BID WC  . darunavir-cobicistat  1 tablet Oral Q breakfast  . dolutegravir  50 mg Oral Q breakfast  . furosemide  40 mg Intravenous BID  . isosorbide-hydrALAZINE  1 tablet Oral TID  . rilpivirine  25 mg Oral Q breakfast  . senna-docusate  1 tablet Oral Daily  . sodium bicarbonate  1,300 mg Oral BID    Objective: Vital signs in last 24 hours: Temp:  [97.9 F (36.6 C)-98.8 F (37.1 C)] 98.5 F (36.9 C) (06/26 1705) Pulse Rate:  [33-99] 72 (06/26 1705) Resp:  [14-26] 19 (06/26 1705) BP: (146-177)/(78-108) 169/107 (06/26 0837) SpO2:  [84 %-100 %] 84 % (06/26 1705) Weight:  [188 lb 7.9 oz (85.5 kg)] 188 lb 7.9 oz (85.5 kg) (06/26 0423) Physical Exam  Constitutional: He is oriented to person, place, and time. He appears well-developed and well-nourished. No distress.  HENT:  Mouth/Throat: Oropharynx is clear and moist. No oropharyngeal exudate.  Cardiovascular: Normal rate, regular rhythm and normal heart sounds. Exam reveals no gallop and no friction rub.  No murmur heard.  Pulmonary/Chest: Effort normal and breath sounds normal. No respiratory distress. He has no wheezes.  Abdominal: Soft. Bowel  sounds are normal. He exhibits no distension. +mild LQ tenderness.  Ext= +2 edema to LE B Neurological: He is alert and oriented to person, place, and time.  Skin: Skin is warm and dry. No rash noted. No erythema.  Psychiatric: He has a normal mood and affect. His behavior is normal.     Lab Results Recent Labs    05/10/18 1424  05/11/18 1521 05/12/18 0902  WBC 4.8  --   --   --   HGB 7.2*  --   --   --   HCT 22.9*  --   --   --   NA 143   < > 141 140  K 3.8   < > 3.9 3.4*  CL 117*   < > 117* 115*  CO2 15*   < > 13* 15*  BUN 73*   < > 71* 66*  CREATININE 5.82*   < > 5.92* 6.11*   < > = values in this interval not displayed.   Liver Panel Recent Labs    05/10/18 1749 05/11/18 1521  PROT 6.2*  --   ALBUMIN 1.5* 1.8*  AST 26  --   ALT 16*  --   ALKPHOS 36*  --   BILITOT 0.5  --     Microbiology: reveiwed Studies/Results: US Renal  Result Date: 05/10/2018 CLINICAL DATA:  AKI, elevated BUN and creatinine. EXAM: RENAL /  URINARY TRACT ULTRASOUND COMPLETE COMPARISON:  None. FINDINGS: Right Kidney: Length: 12 cm. RIGHT renal cortex is diffusely echogenic. Probable small cyst measuring 12 mm. No suspicious cortical mass or hydronephrosis seen. Left Kidney: Length: 13.5 cm. LEFT renal cortex is also diffusely echogenic. No suspicious mass or hydronephrosis seen. Bladder: Appears normal for degree of bladder distention. IMPRESSION: 1. Both kidneys are diffusely echogenic indicating medical renal disease. No suspicious mass or hydronephrosis within either kidney. 2. Bladder is unremarkable, partially decompressed. Electronically Signed   By: Franki Cabot M.D.   On: 05/10/2018 21:15   Dg Abd Portable 1v  Result Date: 05/12/2018 CLINICAL DATA:  Nausea, vomiting, and diarrhea today. Also left lower quadrant pain. History of HIV, lymphoma, CHF. EXAM: PORTABLE ABDOMEN - 1 VIEW COMPARISON:  Abdominal and pelvic CT scan of May 20, 2017 FINDINGS: The bowel gas pattern is within the limits  of normal. There are no abnormal soft tissue calcifications. 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 The lumbar spine and visualized portions of the pelvis are unremarkable. IMPRESSION: No acute intra-abdominal or pelvic abnormality is observed. Electronically Signed   By: David  Martinique M.D.   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 Will give TDF sparing regimen. Did not tolerate meds this morning. Recommend to premedicate with anti emetic 30 mins before getting his hiv meds  oi proph = will continue with weekly azithromycin plus dapsone. Awaiting g6pd . Will avoid bactrim for now  CKD = cr at 6.11. Non oligouric. Continues to diurese. Unclear if he weill need HD during this hospitalization vs follow as an outpatient. Some insult may reverse as he is on his HIV meds  Lower abdominal pain = abd xray does not suggest ileus. Will continue to monitor. Likely need colonoscopy as an outpatient but maybe sooner  Unstable angina= to get stress test if continues to have chest pain.  [facility] for Infectious Diseases Cell: [phone] Pager: [phone]  05/12/2018, 6:10 PM

## 2018-05-13 LAB — CBC
HCT: 25.4 % — ABNORMAL LOW (ref 39.0–52.0)
Hemoglobin: 8.2 g/dL — ABNORMAL LOW (ref 13.0–17.0)
MCH: 27.8 pg (ref 26.0–34.0)
MCHC: 32.3 g/dL (ref 30.0–36.0)
MCV: 86.1 fL (ref 78.0–100.0)
PLATELETS: 259 10*3/uL (ref 150–400)
RBC: 2.95 MIL/uL — AB (ref 4.22–5.81)
RDW: 17.1 % — AB (ref 11.5–15.5)
WBC: 6 10*3/uL (ref 4.0–10.5)

## 2018-05-13 LAB — RENAL FUNCTION PANEL
Albumin: 1.8 g/dL — ABNORMAL LOW (ref 3.5–5.0)
Anion gap: 10 (ref 5–15)
BUN: 66 mg/dL — ABNORMAL HIGH (ref 6–20)
CALCIUM: 7.6 mg/dL — AB (ref 8.9–10.3)
CHLORIDE: 116 mmol/L — AB (ref 98–111)
CO2: 17 mmol/L — AB (ref 22–32)
CREATININE: 7.1 mg/dL — AB (ref 0.61–1.24)
GFR calc Af Amer: 9 mL/min — ABNORMAL LOW (ref >60)
GFR calc non Af Amer: 8 mL/min — ABNORMAL LOW (ref >60)
GLUCOSE: 95 mg/dL (ref 70–99)
Phosphorus: 6.4 mg/dL — ABNORMAL HIGH (ref 2.5–4.6)
Potassium: 3.6 mmol/L (ref 3.5–5.1)
Sodium: 143 mmol/L (ref 135–145)

## 2018-05-13 LAB — LIPASE, BLOOD: Lipase: 93 U/L — ABNORMAL HIGH (ref 11–51)

## 2018-05-13 LAB — COMPREHENSIVE METABOLIC PANEL
ALK PHOS: 40 U/L (ref 38–126)
ALT: 13 U/L (ref 0–44)
AST: 21 U/L (ref 15–41)
Albumin: 1.8 g/dL — ABNORMAL LOW (ref 3.5–5.0)
Anion gap: 11 (ref 5–15)
BILIRUBIN TOTAL: 0.5 mg/dL (ref 0.3–1.2)
BUN: 65 mg/dL — ABNORMAL HIGH (ref 6–20)
CALCIUM: 7.5 mg/dL — AB (ref 8.9–10.3)
CO2: 16 mmol/L — ABNORMAL LOW (ref 22–32)
CREATININE: 7.1 mg/dL — AB (ref 0.61–1.24)
Chloride: 116 mmol/L — ABNORMAL HIGH (ref 98–111)
GFR calc non Af Amer: 8 mL/min — ABNORMAL LOW (ref >60)
GFR, EST AFRICAN AMERICAN: 9 mL/min — AB (ref >60)
Glucose, Bld: 95 mg/dL (ref 70–99)
Potassium: 3.6 mmol/L (ref 3.5–5.1)
Sodium: 143 mmol/L (ref 135–145)
TOTAL PROTEIN: 5.7 g/dL — AB (ref 6.5–8.1)

## 2018-05-13 LAB — GLUCOSE 6 PHOSPHATE DEHYDROGENASE
G6PDH: 11.5 U/g{Hb} (ref 4.6–13.5)
HEMOGLOBIN: 8.9 g/dL — AB (ref 13.0–17.7)

## 2018-05-13 LAB — TOXOPLASMA ANTIBODIES- IGG AND  IGM: Toxoplasma IgG Ratio: <3 IU/mL (ref 0.0–7.1)

## 2018-05-13 LAB — MAGNESIUM: Magnesium: 1.8 mg/dL (ref 1.7–2.4)

## 2018-05-13 MED ORDER — DAPSONE 100 MG PO TABS
100.0000 mg | ORAL_TABLET | Freq: Every day | ORAL | Status: DC
  Administered 2018-05-13 – 2018-05-26 (×14): 100 mg via ORAL
  Filled 2018-05-13 (×14): qty 1

## 2018-05-13 NOTE — Progress Notes (Addendum)
Daily Rounding Note  05/13/2018, 2:09 PM  LOS: 3 days   SUBJECTIVE:   Chief complaint: Patient aggravated with what he perceives as delay in administration of pain medication.  Nausea persists but improved after meds and being allowed to eat.  Had BM yesterday and again this morning which were brown, soft..  Pain in left lower quadrant improved after meds and being allowed to eat. Patient thinks the reason for his pain and nausea are because of not eating.  However he had his nausea yesterday after he had eaten some of his breakfast. Was going to have a Lexiscan today but this has been delayed until tomorrow so that he can eat and calm down.   OBJECTIVE:         Vital signs in last 24 hours:    Temp:  [97.8 F (36.6 C)-98.5 F (36.9 C)] 97.8 F (36.6 C) (06/27 0845) Pulse Rate:  [72-79] 79 (06/27 0845) Resp:  [19-20] 19 (06/27 1321) BP: (141-180)/(84-104) 180/104 (06/27 0845) SpO2:  [84 %-100 %] 100 % (06/27 0845) Weight:  [186 lb 1.1 oz (84.4 kg)] 186 lb 1.1 oz (84.4 kg) (06/27 0500) Last BM Date: 05/12/18 Filed Weights   05/11/18 0416 05/12/18 0423 05/13/18 0500  Weight: 186 lb 8.2 oz (84.6 kg) 188 lb 7.9 oz (85.5 kg) 186 lb 1.1 oz (84.4 kg)   General: Not ill-appearing Heart: RRR Chest: Clear bilaterally no shortness of breath.  No cough. Abdomen: Soft.  No tenderness.  Active bowel sounds.  No distention. Extremities: No CCE. Neuro/Psych: Alert.  Oriented x3.  Pressured speech.  Intake/Output from previous day: 06/26 0701 - 06/27 0700 In: 580 [P.O.:580] Out: 1500 [Urine:1500]  Intake/Output this shift: Total I/O In: 400 [P.O.:400] Out: -   Lab Results: Recent Labs    05/10/18 1424 05/13/18 0241  WBC 4.8 6.0  HGB 7.2* 8.2*  HCT 22.9* 25.4*  PLT 272 259   BMET Recent Labs    05/11/18 1521 05/12/18 0902 05/13/18 0241  NA 141 140 143  143  K 3.9 3.4* 3.6  3.6  CL 117* 115* 116*  116*  CO2  13* 15* 16*  17*  GLUCOSE 137* 109* 95  95  BUN 71* 66* 65*  66*  CREATININE 5.92* 6.11* 7.10*  7.10*  CALCIUM 7.5* 7.8* 7.5*  7.6*   LFT Recent Labs    05/10/18 1749 05/11/18 1521 05/13/18 0241  PROT 6.2*  --  5.7*  ALBUMIN 1.5* 1.8* 1.8*  1.8*  AST 26  --  21  ALT 16*  --  13  ALKPHOS 36*  --  40  BILITOT 0.5  --  0.5   PT/INR Recent Labs    05/10/18 1749  LABPROT 13.5  INR 1.04   Hepatitis Panel No results for input(s): HEPBSAG, HCVAB, HEPAIGM, HEPBIGM in the last 72 hours.  Studies/Results: Dg Abd Portable 1v  Result Date: 05/12/2018 CLINICAL DATA:  Nausea, vomiting, and diarrhea today. Also left lower quadrant pain. History of HIV, lymphoma, CHF. EXAM: PORTABLE ABDOMEN - 1 VIEW COMPARISON:  Abdominal and pelvic CT scan of May 20, 2017 FINDINGS: The bowel gas pattern is within the limits of normal. There are no abnormal soft tissue calcifications. 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 The lumbar spine and visualized portions of the pelvis are unremarkable. IMPRESSION: No acute intra-abdominal or pelvic abnormality is observed. Electronically Signed   By: David  Martinique M.D.   On: 05/12/2018 12:34  ASSESMENT:   *   Rectal bleeding versus bleeding from perirectal lesions  *    Normocytic anemia.  Not iron, B12 deficient  *    Nausea, non-bloody emesis.  Abdominal pain.  This may be side effect of new medications, specifically Prezista and Prezcobix.  However symptoms are better today.   LFTs, lipase unremarkable other than hypoalbuminemia.   *   Perirectal lesions.  Diagnosed as boils in the past.  ?hidradenitis?, ?  Fistulizing  disease?.  CT scan 1 year ago did show distal ileal thickening.  Also has remote history of T-cell lymphoma of the jaw.  *    Poorly controlled HIV.    Noncompliant with medications.  Started antiretrovirals this morning.  *    AKI.  Nonoliguric renal failure.  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 Nephrology treated him with IV albumin and has initiated oral sodium bicarb.   There is hope, that with resumption of his HIV meds and better control of his HIV, that the renal function might improve.  However he may require dialysis, though his candidacy for long-term dialysis given his history of noncompliance is in question.  *    Atypical chest pain.  Slight elevation in troponins.  Lower extremity edema.  Poorly controlled hypertension. Hypertensive cardiomyopathy, EF 45 -50%.  Global hypokinesis. Responding well to diuretics.  But remains volume overloaded and diuresis complicated by kidney injury. Lexi scan set for tomorrow  *    Hypoalbuminemia.  PLAN   *    Defer decision as to CT scan of abdomen pelvis with oral contrast to Dr Carlean Purl.  *     Ultimately ought to undergo colonoscopy but now is not the time.  Underwriting factor to all of this is the patient's limited tolerance for extensive testing and medical intervention    Azucena Freed  05/13/2018, 2:09 PM Phone [phone]    [place] Attending   I have taken an interval history, reviewed the chart and examined the patient. I agree with the Advanced Practitioner's note, impression and recommendations.   Renal failure w/u most important now  Will f/u tomorrow  Gatha Mayer, MD, [facility] 05/13/2018 9:17 PM

## 2018-05-13 NOTE — Progress Notes (Signed)
[facility] NEPHROLOGY PROGRESS NOTE  Assessment/ Plan: Pt is a 57 y.o. yo male with history of uncontrolled hypertension, HIV, T-cell lymphoma, polysubstance abuse, noncompliant with the medication including HIV medications presented from ID clinic for chest pain, shortness of breath and lower extremity edema.  Consulted for renal failure.  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 Normal renal function in 05/2017. 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  On admission patient had hypertensive urgency with systolic blood pressure more than 200s.  Patient has bilateral echogenic kidneys, anemia,  acidosis points towards underlying CKD. -Benazepril and Bactrim were listed as home medication however patient was not taking any medication at home. -Urinalysis with protein, RBCs and WBCs. Urine PCR 4.29, unable to use ACEI or ARB because of renal failure. Now on HIV medication.  Follow-up PTH level and acute hepatitis panel. -Receiving IV Lasix for the management of lower extremity edema and CHF.  -Urine output of 1500 cc in 24 hours.  He still has some lower extremity edema.  Plan to continue IV Lasix today.  Serum creatinine level noted to be elevated to 7.1 today.  Continue sodium bicarbonate.  I have discussed with the patient regarding worsening renal failure and possible need of dialysis if continue to worsen.  Patient verbalized understanding and agreed for dialysis if needed.  I am not sure how compliant he will be with outpatient dialysis if needed.  -Avoid nephrotoxins.  #Hypertension: Monitor blood pressure.  Continue BiDil and amlodipine.  # LE edema DOE: Echo with EF of 45 to 50% with mildly reduced systolic function. Cardiology evaluation ongoing.  Lower extremity edema still persist.  #Anemia:  GI evaluation ongoing because of fecal occult blood test positive.  Iron saturation 44%.    Hemoglobin was 7.2.  Received dose of Aranesp on 6/25.  #Hypomagnesemia: Improved.  Reordered a dose of KCl.  #Nausea vomiting and decreased oral intake: GI evaluation ongoing.  May need imaging studies of abdomen.  Subjective: Seen and examined at bedside.  Reported nausea vomiting and decreased oral intake.  Some abdomen discomfort.  No chest pain or shortness of breath. Objective Vital signs in last 24 hours: Vitals:   05/12/18 1705 05/12/18 2327 05/13/18 0500 05/13/18 0845  BP:  (!) 141/84  (!) 180/104  Pulse: 72 79  79  Resp: 19 20  20   Temp: 98.5 F (36.9 C) 98.2 F (36.8 C)  97.8 F (36.6 C)  TempSrc: Oral Oral  Oral  SpO2: (!) 84% 100%  100%  Weight:   84.4 kg (186 lb 1.1 oz)   Height:       Weight change: -1.1 kg (-2 lb 6.8 oz)  Intake/Output Summary (Last 24 hours) at 05/13/2018 0927 Last data filed at 05/13/2018 0600 Gross per 24 hour  Intake 580 ml  Output 1150 ml  Net -570 ml       Labs: Basic Metabolic Panel: Recent Labs  Lab 05/11/18 1521 05/12/18 0902 05/13/18 0241  NA 141 140 143  143  K 3.9 3.4* 3.6  3.6  CL 117* 115* 116*  116*  CO2 13* 15* 16*  17*  GLUCOSE 137* 109* 95  95  BUN 71* 66* 65*  66*  CREATININE 5.92* 6.11* 7.10*  7.10*  CALCIUM 7.5* 7.8* 7.5*  7.6*  PHOS 7.2*  --  6.4*   Liver Function Tests: Recent Labs  Lab 05/10/18 1749 05/11/18 1521 05/13/18 0241  AST 26  --  21  ALT 16*  --  13  ALKPHOS 36*  --  40  BILITOT 0.5  --  0.5  PROT 6.2*  --  5.7*  ALBUMIN 1.5* 1.8* 1.8*  1.8*   Recent Labs  Lab 05/13/18 0241  LIPASE 93*   No results for input(s): AMMONIA in the last 168 hours. CBC: Recent Labs  Lab 05/10/18 1424 05/13/18 0241  WBC 4.8 6.0  HGB 7.2* 8.2*  HCT 22.9* 25.4*  MCV 84.2 86.1  PLT 272 259   Cardiac Enzymes: Recent Labs  Lab 05/10/18 1906 05/10/18 2225  TROPONINI 0.15* 0.18*   CBG: No  results for input(s): GLUCAP in the last 168 hours.  Iron Studies:  Recent Labs    05/10/18 1906  IRON 83  TIBC 190*  FERRITIN 1,380*   Studies/Results: Dg Abd Portable 1v  Result Date: 05/12/2018 CLINICAL DATA:  Nausea, vomiting, and diarrhea today. Also left lower quadrant pain. History of HIV, lymphoma, CHF. EXAM: PORTABLE ABDOMEN - 1 VIEW COMPARISON:  Abdominal and pelvic CT scan of May 20, 2017 FINDINGS: The bowel gas pattern is within the limits of normal. There are no abnormal soft tissue calcifications. 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 The lumbar spine and visualized portions of the pelvis are unremarkable. IMPRESSION: No acute intra-abdominal or pelvic abnormality is observed. Electronically Signed   By: David  Martinique M.D.   On: 05/12/2018 12:34    Medications: Infusions: . sodium chloride Stopped (05/12/18 1526)    Scheduled Medications: . amLODipine  10 mg Oral Daily  . azithromycin  1,200 mg Oral Weekly  . carvedilol  12.5 mg Oral BID WC  . darunavir-cobicistat  1 tablet Oral Q breakfast  . dolutegravir  50 mg Oral Q breakfast  . furosemide  40 mg Intravenous BID  . isosorbide-hydrALAZINE  1 tablet Oral TID  . rilpivirine  25 mg Oral Q breakfast  . senna-docusate  1 tablet Oral Daily  . sodium bicarbonate  1,300 mg Oral BID    have reviewed scheduled and prn medications.  Physical Exam: General: Sitting on bed, not in distress Heart: Regular rate rhythm S1-S2 normal, no rubs Lungs: Clear bilateral, no crackle Abdomen:, Nontender, nondistended Extremities: Bilateral lower extremity 1+ edema.  Unchanged   Kedar Sedano Prasad Ethon Wymer 05/13/2018,9:27 AM  LOS: 3 days

## 2018-05-13 NOTE — Progress Notes (Signed)
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 [name] w/ right-sided chest pain with radiation to the right arm, nausea, and SOB after walking a short distance.  He also reported B LE swelling, paroxysmal nocturnal dyspnea, and bright red blood per rectum for a weeks duration.      Subjective: 6/27 Very aggravated, wants to transfer to a tertiary care center, does not like delayed in administration of his pain medication Endorses some nausea  A/O x4, negative CP, negative SOB.   Complains of left lower quadrant pain.       Assessment & Plan:   Active Problems:   Human immunodeficiency virus (HIV) disease ([place])   Malignant lymphomas of lymph nodes of head, face, and neck (HCC)   Hypertension   Chest pain   Tobacco use   Furuncle of gluteal region   CHF (congestive heart failure) (HCC)   Hypertensive urgency   GIB (gastrointestinal bleeding)   AKI (acute kidney injury) (HCC)   Heme + stool   Normocytic anemia   HIV/AIDS - Noncompliant with medication.  Per patient secondary to cost. - 6/26 consult to case management and social work resources for patient to help pay for HIV medication placed. -Viral load was 80K w/ CD4 80 June 2018 -Patient is HIV medication restarted.  ID on board -Azithromycin 1200 mg weekly, dapsone weekly, awaiting G6PD -Prezcobix 800-150 mg daily -Tivicay 50 mg daily -Edurant 25 mg daily Premedicated with antibiotic prior to administering oral medications  Acute renal failure - Multifactorial hypertensive nephropathy, HIV nephropathy.   -Discussed case with nephrology Dr. Carolin Sicks unsure whether patient's renal function will improve.  Considering if patient should be started on HD if  renal function continues to deteriorate given his noncompliance.  Short-term HD may be appropriate however do not believe patient would be a good candidate for long-term HD given his poor compliance.  Dr. Carolin Sicks believes that patient's renal function may improve with resumption of his HIV medication. - Avoid nephrotoxic medication Recent Labs  Lab 05/10/18 1424 05/10/18 1749 05/11/18 1521 05/12/18 0902 05/13/18 0241  CREATININE 5.82* 5.91* 5.92* 6.11* 7.10*  7.10*  IV Lasix for management of lower extremity edema Patient agreeable to dialysis if needed   Acute systolic and diastolic CHF/chest pain. -Strict in and out -Daily weight - Transfuse for hemoglobin<8 -Cardiology  -recommend ischemia evaluation since he may be progressing to dialysis, now scheduled for Myoview -Amlodipine 10 mg daily, BiDil -Coreg 12.5 mg BID - Lasix 40 mg IV twice a day   Uncontrolled essential HTN - Multifactorial noncompliance with medication, volume overload. -On Norvasc, Coreg, Lasix, BiDil   GI bleed/BRBPR -Explained at length to patient while colonoscopy would be necessary given his symptoms and age.  Currently patient refuses colonoscopy. -GI seeing patient   Normocytic anemia -Anemia panel consistent with anemia of chronic disease.  Suspect a combination of AIDS +Renal disease does not appear to have significant GI bleed. 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  Culture not sent   Polysubstance abuse - 6/25 UDS positive marijuana     Goal of care -6/26 PALLIATIVE CARE: Noncompliant  patient with HIV, acute systolic CHF, acute renal failure possibly going on HD discussed change of status to DNR, discussed short-term vs long-term goals of care     DVT prophylaxis: SCD Code Status: Full Family Communication: None Disposition Plan:  Not Stable for discharge   Consultants:   Cardiology ID Nephrology GI   Procedures/Significant Events:  6/25 echocardiogram:Left ventricle: LVEF =45% to 50%. Diffuse hypokinesis.- Grade 1   diastolic dysfunction. - Left atrium:  moderately dilated.    I have personally reviewed and interpreted all radiology studies and my findings are as above.  VENTILATOR SETTINGS:    Cultures 6/24 MRSA PCR negative  Antimicrobials: Anti-infectives (From admission, onward)   Start     Stop   05/12/18 0730  darunavir-cobicistat (PREZCOBIX) 800-150 MG per tablet 1 tablet         05/12/18 0730  dolutegravir (TIVICAY) tablet 50 mg         05/12/18 0730  rilpivirine (EDURANT) tablet 25 mg         05/12/18 0700  dolutegravir (TIVICAY) tablet 50 mg  Status:  Discontinued     05/12/18 0047   05/12/18 0700  rilpivirine (EDURANT) tablet 25 mg  Status:  Discontinued     05/12/18 0047   05/12/18 0700  darunavir-cobicistat (PREZCOBIX) 800-150 MG per tablet 1 tablet  Status:  Discontinued     05/12/18 0047   05/11/18 1015  azithromycin (ZITHROMAX) tablet 1,200 mg         05/11/18 1015  sulfamethoxazole-trimethoprim (BACTRIM,SEPTRA) 400-80 MG per tablet 1 tablet  Status:  Discontinued     05/11/18 1228   05/11/18 0800  elvitegravir-cobicistat-emtricitabine-tenofovir (GENVOYA) 150-150-200-10 MG tablet 1 tablet  Status:  Discontinued     05/10/18 1931   05/11/18 0800  darunavir (PREZISTA) tablet 800 mg  Status:  Discontinued     05/10/18 1931       Devices   LINES / TUBES:      Continuous Infusions: . sodium chloride Stopped (05/12/18 1526)     Objective: Vitals:   05/12/18 1705 05/12/18 2327 05/13/18 0500 05/13/18 0845  BP:  (!) 141/84  (!) 180/104  Pulse: 72 79  79  Resp: 19 20  20   Temp: 98.5 F (36.9 C) 98.2 F (36.8 C)  97.8 F (36.6 C)  TempSrc: Oral Oral  Oral  SpO2: (!) 84% 100%  100%  Weight:   84.4 kg (186 lb 1.1 oz)   Height:        Intake/Output Summary (Last 24 hours) at 05/13/2018 1216 Last data  filed at 05/13/2018 1100 Gross per 24 hour  Intake 980 ml  Output 1150 ml  Net -170 ml   Filed Weights   05/11/18 0416 05/12/18 0423 05/13/18 0500  Weight: 84.6 kg (186 lb 8.2 oz) 85.5 kg (188 lb 7.9 oz) 84.4 kg (186 lb 1.1 oz)    Examination:  General: A/O x4, No acute respiratory distress Neck:  Negative scars, masses, torticollis, lymphadenopathy, JVD Lungs: Clear to auscultation bilaterally without wheezes or crackles Cardiovascular: Regular rate and rhythm without murmur gallop or rub normal S1 and S2 Abdomen: Mild tenderness to palpation in the left lower quadrant, nondistended, positive soft, bowel sounds, no rebound, no ascites, no appreciable mass Extremities: No significant cyanosis, clubbing, or edema bilateral lower extremities Skin: Boil on the right ureter buttocks at the crack scab in place Psychiatric:  Negative depression, negative anxiety, negative fatigue, negative mania, extremely poor understanding of multiple medical problems Central nervous system:  Cranial nerves II through XII intact, tongue/uvula midline, all extremities muscle strength 5/5, sensation intact throughout,  negative dysarthria, negative expressive aphasia, negative receptive aphasia.  .     Data Reviewed: Care during the described time interval was provided by me .  I have reviewed this patient's available data, including medical history, events of note, physical examination, and all test results as part of my evaluation.   CBC: Recent Labs  Lab 05/10/18 1424 05/13/18 0241  WBC 4.8 6.0  HGB 7.2* 8.2*  HCT 22.9* 25.4*  MCV 84.2 86.1  PLT 272 193   Basic Metabolic Panel: Recent Labs  Lab 05/10/18 1424 05/10/18 1749 05/11/18 1521 05/12/18 0902 05/13/18 0241  NA 143 141 141 140 143  143  K 3.8 3.8 3.9 3.4* 3.6  3.6  CL 117* 113* 117* 115* 116*  116*  CO2 15* 15* 13* 15* 16*  17*  GLUCOSE 106* 102* 137* 109* 95  95  BUN 73* 75* 71* 66* 65*  66*  CREATININE 5.82* 5.91* 5.92*  6.11* 7.10*  7.10*  CALCIUM 7.5* 7.3* 7.5* 7.8* 7.5*  7.6*  MG  --   --   --  1.4* 1.8  PHOS  --   --  7.2*  --  6.4*   GFR: Estimated Creatinine Clearance: 11.6 mL/min (A) (by C-G formula based on SCr of 7.1 mg/dL (H)). Liver Function Tests: Recent Labs  Lab 05/10/18 1749 05/11/18 1521 05/13/18 0241  AST 26  --  21  ALT 16*  --  13  ALKPHOS 36*  --  40  BILITOT 0.5  --  0.5  PROT 6.2*  --  5.7*  ALBUMIN 1.5* 1.8* 1.8*  1.8*   Recent Labs  Lab 05/13/18 0241  LIPASE 93*   No results for input(s): AMMONIA in the last 168 hours. Coagulation Profile: Recent Labs  Lab 05/10/18 1749  INR 1.04   Cardiac Enzymes: Recent Labs  Lab 05/10/18 1906 05/10/18 2225  TROPONINI 0.15* 0.18*   BNP (last 3 results) No results for input(s): PROBNP in the last 8760 hours. HbA1C: No results for input(s): HGBA1C in the last 72 hours. CBG: No results for input(s): GLUCAP in the last 168 hours. Lipid Profile: No results for input(s): CHOL, HDL, LDLCALC, TRIG, CHOLHDL, LDLDIRECT in the last 72 hours. Thyroid Function Tests: Recent Labs    05/10/18 1906  TSH 4.213   Anemia Panel: Recent Labs    05/10/18 1906  VITAMINB12 443  FERRITIN 1,380*  TIBC 190*  IRON 83  RETICCTPCT 1.3   Urine analysis:    Component Value Date/Time   COLORURINE YELLOW 05/11/2018 0606   APPEARANCEUR HAZY (A) 05/11/2018 0606   LABSPEC 1.012 05/11/2018 0606   PHURINE 5.0 05/11/2018 0606   GLUCOSEU NEGATIVE 05/11/2018 0606   GLUCOSEU NEG mg/dL 11/23/2006 2036   HGBUR MODERATE (A) 05/11/2018 0606   BILIRUBINUR NEGATIVE 05/11/2018 0606   KETONESUR NEGATIVE 05/11/2018 0606   PROTEINUR >=300 (A) 05/11/2018 0606   UROBILINOGEN 0.2 11/23/2006 2036   NITRITE NEGATIVE 05/11/2018 0606   LEUKOCYTESUR NEGATIVE 05/11/2018 0606   Sepsis Labs: @LABRCNTIP (procalcitonin:4,lacticidven:4)  ) Recent Results (from the past 240 hour(s))  MRSA PCR Screening     Status: None   Collection Time: 05/10/18 11:00  PM  Result Value Ref Range Status   MRSA by PCR NEGATIVE NEGATIVE Final    Comment:        The GeneXpert MRSA Assay (FDA approved for NASAL specimens only), is one component of a  comprehensive MRSA colonization surveillance program. It is not intended to diagnose MRSA infection nor to guide or monitor treatment for MRSA infections. Performed at [facility], [place] [address] [place], [place] [address]          Radiology Studies: Dg Abd Portable 1v  Result Date: 05/12/2018 CLINICAL DATA:  Nausea, vomiting, and diarrhea today. Also left lower quadrant pain. History of HIV, lymphoma, CHF. EXAM: PORTABLE ABDOMEN - 1 VIEW COMPARISON:  Abdominal and pelvic CT scan of May 20, 2017 FINDINGS: The bowel gas pattern is within the limits of normal. There are no abnormal soft tissue calcifications. 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 The lumbar spine and visualized portions of the pelvis are unremarkable. IMPRESSION: No acute intra-abdominal or pelvic abnormality is observed. Electronically Signed   By: David  Martinique M.D.   On: 05/12/2018 12:34        Scheduled Meds: . amLODipine  10 mg Oral Daily  . azithromycin  1,200 mg Oral Weekly  . carvedilol  12.5 mg Oral BID WC  . darunavir-cobicistat  1 tablet Oral Q breakfast  . dolutegravir  50 mg Oral Q breakfast  . furosemide  40 mg Intravenous BID  . isosorbide-hydrALAZINE  1 tablet Oral TID  . rilpivirine  25 mg Oral Q breakfast  . senna-docusate  1 tablet Oral Daily  . sodium bicarbonate  1,300 mg Oral BID   Continuous Infusions: . sodium chloride Stopped (05/12/18 1526)     LOS: 3 days    Time spent: 40 minutes    Reyne Dumas, MD [facility]    If 7PM-7AM, please contact night-coverage [email or web address] Password [facility] 05/13/2018, 12:16 PM

## 2018-05-13 NOTE — Progress Notes (Signed)
[name] for Infectious Disease  Date of Admission:  05/10/2018             ASSESSMENT/PLAN  [name] is a 57 y/o male admitted from the ID clinic with hypertensive urgency and being treated for hypertensive urgency, kidney injury, GI bleed and poorly controlled AIDS. Blood pressures appear to be improving. Nephrology following with potential need for dialysis in the future. AIDS viral load pending with CD4 count of 50. Improved tolerance of ART today.  Dapsone and azithromycin for OI prophylaxis. results which were cancelled. GI is following abdominal pain and currently refusing a colonoscopy. He is scheduled for Myoview scan tomorrow.   1. Continue current dosage of Prezcobix, Rilpivirine, and Dalutegravir. 2. G6PD reordered for therapeutic drug monitoring of Dapsone.  3. Blood pressures improving. Appreciate cardiology and nephrology input.  4. Kidney function stable with no improvements overnight. Nephrology following.   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 amLODipine  10 mg Oral Daily  . azithromycin  1,200 mg Oral Weekly  . carvedilol  12.5 mg Oral BID WC  . darunavir-cobicistat  1 tablet Oral Q breakfast  . dolutegravir  50 mg Oral Q breakfast  . furosemide  40 mg Intravenous BID  . isosorbide-hydrALAZINE  1 tablet Oral TID  . rilpivirine  25 mg Oral Q breakfast  . senna-docusate  1 tablet Oral Daily  . sodium bicarbonate  1,300 mg Oral BID    SUBJECTIVE:  Afebrile overnight. Blood pressures appear to be improving. Renal function with no significant changes. Potential need for dialysis. Currently is hungry and has a  headache secondary to stress as he is not fond of the food and did not want to wait to eat. Able to tolerate his medication better with only a small amount of nausea.   Allergies  Allergen Reactions  . Penicillins Anaphylaxis    Has patient had a PCN reaction causing immediate rash, facial/tongue/throat swelling, SOB or lightheadedness with hypotension: Unknown Has patient had a PCN reaction causing severe rash involving mucus membranes or skin necrosis: Unknown Has patient had a PCN reaction that required hospitalization: Unknown Has patient had a PCN reaction occurring within the last 10 years: No If all of the above answers are "NO", then may proceed with Cephalosporin use.     Review of Systems: Review of Systems  Constitutional: Negative for chills and fever.  Respiratory: Negative for cough, shortness of breath and wheezing.   Cardiovascular: Positive for leg swelling. Negative for chest pain.  Gastrointestinal: Positive for abdominal pain and nausea. Negative for constipation, diarrhea and vomiting.  Genitourinary: Negative for frequency and urgency.  Neurological: Positive for headaches.      OBJECTIVE: Vitals:   05/12/18 1705 05/12/18 2327 05/13/18 0500 05/13/18 0845  BP:  (!) 141/84  (!) 180/104  Pulse: 72 79  79  Resp: 19 20  20   Temp: 98.5 F (36.9 C) 98.2 F (36.8 C)  97.8 F (36.6 C)  TempSrc: Oral Oral  Oral  SpO2: (!) 84% 100%  100%  Weight:   186 lb 1.1 oz (84.4 kg)  Height:       Body mass index is 30.96 kg/m.  Physical Exam  Constitutional: He is oriented to person, place, and time. He appears well-developed and well-nourished. No distress.  Cardiovascular: Normal rate, regular rhythm, normal heart sounds and intact distal pulses.  Pulmonary/Chest: Effort normal and breath sounds normal. No stridor. No respiratory distress. He has no wheezes. He has no rales. He exhibits no tenderness.  Neurological: He is alert and oriented to person, place, and  time.  Skin: Skin is warm and dry.  Psychiatric: He has a normal mood and affect.    Lab Results Lab Results  Component Value Date   WBC 6.0 05/13/2018   HGB 8.2 (L) 05/13/2018   HCT 25.4 (L) 05/13/2018   MCV 86.1 05/13/2018   PLT 259 05/13/2018    Lab Results  Component Value Date   CREATININE 7.10 (H) 05/13/2018   CREATININE 7.10 (H) 05/13/2018   BUN 66 (H) 05/13/2018   BUN 65 (H) 05/13/2018   NA 143 05/13/2018   NA 143 05/13/2018   K 3.6 05/13/2018   K 3.6 05/13/2018   CL 116 (H) 05/13/2018   CL 116 (H) 05/13/2018   CO2 17 (L) 05/13/2018   CO2 16 (L) 05/13/2018    Lab Results  Component Value Date   ALT 13 05/13/2018   AST 21 05/13/2018   ALKPHOS 40 05/13/2018   BILITOT 0.5 05/13/2018     Microbiology: Recent Results (from the past 240 hour(s))  MRSA PCR Screening     Status: None   Collection Time: 05/10/18 11:00 PM  Result Value Ref Range Status   MRSA by PCR NEGATIVE NEGATIVE Final    Comment:        The GeneXpert MRSA Assay (FDA approved for NASAL specimens only), is one component of a comprehensive MRSA colonization surveillance program. It is not intended to diagnose MRSA infection nor to guide or monitor treatment for MRSA infections. Performed at [facility], [place] [address], [place], [place] [address]      [name], [place] [facility] [phone] Pager  05/13/2018  1:23 PM

## 2018-05-13 NOTE — Progress Notes (Signed)
No charge note.  Attempted to see.  Patient in NM stress test.  PMT will try again tomorrow.

## 2018-05-13 NOTE — Progress Notes (Signed)
DAILY PROGRESS NOTE   Patient Name: [name] Date of Encounter: 05/13/2018  Chief Complaint   Lower abdominal pain, no chest pain  Patient Profile   [name] is a 57 y.o. male with a hx of uncontrolled hypertension, HIV disease and noncompliance with antiviral therapy, T-cell lymphoma treated with radiation therapy, remote history of polysubstance abuse, tobacco and alcohol abuse, hip AVN s/p hip replacement who is being seen today for the evaluation of chest pain and shortness of breath at the request of Dr. Thereasa Solo.  Subjective   Diuresed about 1500 cc negative overnight - creatinine continues to rise. May need dialysis. HIV meds changed due to nausea/vomiting. No ileus on x-ray. Remains volume overloaded. Chest pain was not typical and has not persisted. Considering ischemia evaluation - ? Timing. Has been NPO today.  Objective   Vitals:   05/12/18 1705 05/12/18 2327 05/13/18 0500 05/13/18 0845  BP:  (!) 141/84  (!) 180/104  Pulse: 72 79  79  Resp: '19 20  20  ' Temp: 98.5 F (36.9 C) 98.2 F (36.8 C)  97.8 F (36.6 C)  TempSrc: Oral Oral  Oral  SpO2: (!) 84% 100%  100%  Weight:   186 lb 1.1 oz (84.4 kg)   Height:        Intake/Output Summary (Last 24 hours) at 05/13/2018 1139 Last data filed at 05/13/2018 1100 Gross per 24 hour  Intake 980 ml  Output 1150 ml  Net -170 ml   Filed Weights   05/11/18 0416 05/12/18 0423 05/13/18 0500  Weight: 186 lb 8.2 oz (84.6 kg) 188 lb 7.9 oz (85.5 kg) 186 lb 1.1 oz (84.4 kg)    Physical Exam   General appearance: alert and mild distress Neck: no carotid bruit, no JVD and thyroid not enlarged, symmetric, no tenderness/mass/nodules Lungs: diminished breath sounds bilaterally Heart: regular rate and rhythm Abdomen: mild TTP diffusely over the LLQ and RLQ, no rebound, +guarding Extremities: extremities normal, atraumatic, no cyanosis or edema Pulses: 2+ and symmetric Skin: Skin color, texture, turgor normal. No  rashes or lesions Neurologic: Grossly normal Psych: In pain  Inpatient Medications    Scheduled Meds: . amLODipine  10 mg Oral Daily  . azithromycin  1,200 mg Oral Weekly  . carvedilol  12.5 mg Oral BID WC  . darunavir-cobicistat  1 tablet Oral Q breakfast  . dolutegravir  50 mg Oral Q breakfast  . furosemide  40 mg Intravenous BID  . isosorbide-hydrALAZINE  1 tablet Oral TID  . rilpivirine  25 mg Oral Q breakfast  . senna-docusate  1 tablet Oral Daily  . sodium bicarbonate  1,300 mg Oral BID    Continuous Infusions: . sodium chloride Stopped (05/12/18 1526)    PRN Meds: sodium chloride, acetaminophen **OR** [DISCONTINUED] acetaminophen, bisacodyl, HYDROcodone-acetaminophen, labetalol, ondansetron **OR** ondansetron (ZOFRAN) IV, senna-docusate, traMADol   Labs   Results for orders placed or performed during the hospital encounter of 05/10/18 (from the past 48 hour(s))  Renal function panel     Status: Abnormal   Collection Time: 05/11/18  3:21 PM  Result Value Ref Range   Sodium 141 135 - 145 mmol/L   Potassium 3.9 3.5 - 5.1 mmol/L   Chloride 117 (H) 98 - 111 mmol/L    Comment: Please note change in reference range.   CO2 13 (L) 22 - 32 mmol/L   Glucose, Bld 137 (H) 70 - 99 mg/dL    Comment: Please note change in reference range.  BUN 71 (H) 6 - 20 mg/dL    Comment: Please note change in reference range.   Creatinine, Ser 5.92 (H) 0.61 - 1.24 mg/dL   Calcium 7.5 (L) 8.9 - 10.3 mg/dL   Phosphorus 7.2 (H) 2.5 - 4.6 mg/dL   Albumin 1.8 (L) 3.5 - 5.0 g/dL   GFR calc non Af Amer 10 (L) >60 mL/min   GFR calc Af Amer 11 (L) >60 mL/min    Comment: (NOTE) The eGFR has been calculated using the CKD EPI equation. This calculation has not been validated in all clinical situations. eGFR's persistently <60 mL/min signify possible Chronic Kidney Disease.    Anion gap 11 5 - 15    Comment: Performed at [facility] [address] [place], [place] [address]  T-helper  cells (CD4) count (not at [facility])     Status: Abnormal   Collection Time: 05/11/18  3:21 PM  Result Value Ref Range   CD4 T Cell Abs 50 (L) 400 - 2,700 /uL   CD4 % Helper T Cell 6 (L) 33 - 55 %    Comment: Performed at [facility], [place] [address], [place], [place] [address]  Basic metabolic panel     Status: Abnormal   Collection Time: 05/12/18  9:02 AM  Result Value Ref Range   Sodium 140 135 - 145 mmol/L   Potassium 3.4 (L) 3.5 - 5.1 mmol/L   Chloride 115 (H) 98 - 111 mmol/L    Comment: Please note change in reference range.   CO2 15 (L) 22 - 32 mmol/L   Glucose, Bld 109 (H) 70 - 99 mg/dL    Comment: Please note change in reference range.   BUN 66 (H) 6 - 20 mg/dL    Comment: Please note change in reference range.   Creatinine, Ser 6.11 (H) 0.61 - 1.24 mg/dL   Calcium 7.8 (L) 8.9 - 10.3 mg/dL   GFR calc non Af Amer 9 (L) >60 mL/min   GFR calc Af Amer 11 (L) >60 mL/min    Comment: (NOTE) The eGFR has been calculated using the CKD EPI equation. This calculation has not been validated in all clinical situations. eGFR's persistently <60 mL/min signify possible Chronic Kidney Disease.    Anion gap 10 5 - 15    Comment: Performed at [facility] [address], [place], [place] [address]  Magnesium     Status: Abnormal   Collection Time: 05/12/18  9:02 AM  Result Value Ref Range   Magnesium 1.4 (L) 1.7 - 2.4 mg/dL    Comment: Performed at [facility] [address], [place], [place] [address]  Toxoplasma antibodies- IgG and  IgM     Status: None   Collection Time: 05/12/18 10:45 AM  Result Value Ref Range   Toxoplasma IgG Ratio <3.0 0.0 - 7.1 IU/mL    Comment: (NOTE)                                 Negative        <7.2                                 Equivocal  7.2 - 8.7  Positive        >8.7    Toxoplasma Antibody- IgM <3.0 0.0 - 7.9 AU/mL    Comment: (NOTE)                             Negative             <8.0                             Equivocal      8.0 - 9.9                             Positive            >9.9    Comment Comment     Comment: (NOTE) No serological evidence of infection with Toxoplasma. If symptoms persist, submit a new specimen after three weeks. Performed At: [facility] [place], [place] [id number] Rush Farmer MD QR:[id number] Performed at [facility], [place] [address], [place], [place] [address]   Protein / creatinine ratio, urine     Status: Abnormal   Collection Time: 05/12/18  4:10 PM  Result Value Ref Range   Creatinine, Urine 48.68 mg/dL   Total Protein, Urine 209 mg/dL    Comment: RESULTS CONFIRMED BY MANUAL DILUTION   Protein Creatinine Ratio 4.29 (H) 0.00 - 0.15 mg/mg[Cre]    Comment: Performed at [facility], [place] [address] [place], [place] [address]  Renal function panel     Status: Abnormal   Collection Time: 05/13/18  2:41 AM  Result Value Ref Range   Sodium 143 135 - 145 mmol/L   Potassium 3.6 3.5 - 5.1 mmol/L   Chloride 116 (H) 98 - 111 mmol/L    Comment: Please note change in reference range.   CO2 17 (L) 22 - 32 mmol/L   Glucose, Bld 95 70 - 99 mg/dL    Comment: Please note change in reference range.   BUN 66 (H) 6 - 20 mg/dL    Comment: Please note change in reference range.   Creatinine, Ser 7.10 (H) 0.61 - 1.24 mg/dL   Calcium 7.6 (L) 8.9 - 10.3 mg/dL   Phosphorus 6.4 (H) 2.5 - 4.6 mg/dL   Albumin 1.8 (L) 3.5 - 5.0 g/dL   GFR calc non Af Amer 8 (L) >60 mL/min   GFR calc Af Amer 9 (L) >60 mL/min    Comment: (NOTE) The eGFR has been calculated using the CKD EPI equation. This calculation has not been validated in all clinical situations. eGFR's persistently <60 mL/min signify possible Chronic Kidney Disease.    Anion gap 10 5 - 15    Comment: Performed at [facility] [address] [place], [place] [address]  Lipase, blood     Status: Abnormal   Collection Time: 05/13/18  2:41 AM   Result Value Ref Range   Lipase 93 (H) 11 - 51 U/L    Comment: Performed at [facility] [address], [place], [place] [address]  Comprehensive metabolic panel     Status: Abnormal   Collection Time: 05/13/18  2:41 AM  Result Value Ref Range   Sodium 143 135 - 145 mmol/L   Potassium 3.6 3.5 - 5.1 mmol/L   Chloride 116 (H) 98 - 111 mmol/L  Comment: Please note change in reference range.   CO2 16 (L) 22 - 32 mmol/L   Glucose, Bld 95 70 - 99 mg/dL    Comment: Please note change in reference range.   BUN 65 (H) 6 - 20 mg/dL    Comment: Please note change in reference range.   Creatinine, Ser 7.10 (H) 0.61 - 1.24 mg/dL   Calcium 7.5 (L) 8.9 - 10.3 mg/dL   Total Protein 5.7 (L) 6.5 - 8.1 g/dL   Albumin 1.8 (L) 3.5 - 5.0 g/dL   AST 21 15 - 41 U/L   ALT 13 0 - 44 U/L    Comment: Please note change in reference range.   Alkaline Phosphatase 40 38 - 126 U/L   Total Bilirubin 0.5 0.3 - 1.2 mg/dL   GFR calc non Af Amer 8 (L) >60 mL/min   GFR calc Af Amer 9 (L) >60 mL/min    Comment: (NOTE) The eGFR has been calculated using the CKD EPI equation. This calculation has not been validated in all clinical situations. eGFR's persistently <60 mL/min signify possible Chronic Kidney Disease.    Anion gap 11 5 - 15    Comment: Performed at [facility] [address] [place], [place] [address]  Magnesium     Status: None   Collection Time: 05/13/18  2:41 AM  Result Value Ref Range   Magnesium 1.8 1.7 - 2.4 mg/dL    Comment: Performed at [facility] [address], [place], [place] [address]  CBC     Status: Abnormal   Collection Time: 05/13/18  2:41 AM  Result Value Ref Range   WBC 6.0 4.0 - 10.5 K/uL   RBC 2.95 (L) 4.22 - 5.81 MIL/uL   Hemoglobin 8.2 (L) 13.0 - 17.0 g/dL   HCT 25.4 (L) 39.0 - 52.0 %   MCV 86.1 78.0 - 100.0 fL   MCH 27.8 26.0 - 34.0 pg   MCHC 32.3 30.0 - 36.0 g/dL   RDW 17.1 (H) 11.5 - 15.5 %   Platelets 259 150 - 400 K/uL    Comment:  Performed at [facility], [place] [address], [place], [place] [address]    ECG   N/A  Telemetry   Sinus rhythm - Personally Reviewed  Radiology    Dg Abd Portable 1v  Result Date: 05/12/2018 CLINICAL DATA:  Nausea, vomiting, and diarrhea today. Also left lower quadrant pain. History of HIV, lymphoma, CHF. EXAM: PORTABLE ABDOMEN - 1 VIEW COMPARISON:  Abdominal and pelvic CT scan of May 20, 2017 FINDINGS: The bowel gas pattern is within the limits of normal. There are no abnormal soft tissue calcifications. 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 The lumbar spine and visualized portions of the pelvis are unremarkable. IMPRESSION: No acute intra-abdominal or pelvic abnormality is observed. Electronically Signed   By: David  Martinique M.D.   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 Diuresed 1500 cc negative.  Creatinine is rising. Recommend ischemia evaluation since he may be progressing to dialysis - if he needs fistula, an ischemia evaluation prior to that would be helpful. He has not eaten today - will schedule for myoview.   Time Spent Directly with Patient:  I have spent a total of 25 minutes with the patient reviewing hospital notes, telemetry, EKGs, labs and examining the patient as well as establishing an assessment and plan that was discussed personally with the patient. > 50% of time was spent in direct patient care.  Length of Stay:  LOS: 3 days   Pixie Casino, MD, [facility], [place] Director of the Advanced Lipid Disorders &  Cardiovascular Risk Reduction Clinic Diplomate of the American Board of Clinical Lipidology Attending Cardiologist  Direct Dial: [phone]  Fax: [phone]  Website:  [email or web address] Osgood Leata Dominy 05/13/2018, 11:39 AM

## 2018-05-14 ENCOUNTER — Inpatient Hospital Stay (HOSPITAL_COMMUNITY): Payer: Medicare Other

## 2018-05-14 DIAGNOSIS — Z515 Encounter for palliative care: Secondary | ICD-10-CM

## 2018-05-14 DIAGNOSIS — M25552 Pain in left hip: Secondary | ICD-10-CM

## 2018-05-14 DIAGNOSIS — I5022 Chronic systolic (congestive) heart failure: Secondary | ICD-10-CM

## 2018-05-14 DIAGNOSIS — I200 Unstable angina: Secondary | ICD-10-CM

## 2018-05-14 DIAGNOSIS — M25551 Pain in right hip: Secondary | ICD-10-CM

## 2018-05-14 DIAGNOSIS — R079 Chest pain, unspecified: Secondary | ICD-10-CM

## 2018-05-14 DIAGNOSIS — Z7189 Other specified counseling: Secondary | ICD-10-CM

## 2018-05-14 LAB — CBC
HEMATOCRIT: 27.9 % — AB (ref 39.0–52.0)
HEMOGLOBIN: 9 g/dL — AB (ref 13.0–17.0)
MCH: 27.9 pg (ref 26.0–34.0)
MCHC: 32.3 g/dL (ref 30.0–36.0)
MCV: 86.4 fL (ref 78.0–100.0)
Platelets: 266 10*3/uL (ref 150–400)
RBC: 3.23 MIL/uL — AB (ref 4.22–5.81)
RDW: 17.2 % — ABNORMAL HIGH (ref 11.5–15.5)
WBC: 7.4 10*3/uL (ref 4.0–10.5)

## 2018-05-14 LAB — RENAL FUNCTION PANEL
ANION GAP: 11 (ref 5–15)
Albumin: 1.9 g/dL — ABNORMAL LOW (ref 3.5–5.0)
BUN: 62 mg/dL — ABNORMAL HIGH (ref 6–20)
CALCIUM: 7.8 mg/dL — AB (ref 8.9–10.3)
CO2: 17 mmol/L — AB (ref 22–32)
Chloride: 114 mmol/L — ABNORMAL HIGH (ref 98–111)
Creatinine, Ser: 7.36 mg/dL — ABNORMAL HIGH (ref 0.61–1.24)
GFR calc non Af Amer: 7 mL/min — ABNORMAL LOW (ref >60)
GFR, EST AFRICAN AMERICAN: 9 mL/min — AB (ref >60)
GLUCOSE: 93 mg/dL (ref 70–99)
PHOSPHORUS: 6.4 mg/dL — AB (ref 2.5–4.6)
POTASSIUM: 3.4 mmol/L — AB (ref 3.5–5.1)
SODIUM: 142 mmol/L (ref 135–145)

## 2018-05-14 LAB — NM MYOCAR MULTI W/SPECT W/WALL MOTION / EF
CHL CUP RESTING HR STRESS: 75 {beats}/min
CSEPED: 4 min
CSEPEW: 1 METS
Exercise duration (sec): 37 s
Peak HR: 95 {beats}/min

## 2018-05-14 LAB — PTH, INTACT AND CALCIUM
Calcium, Total (PTH): 7.3 mg/dL — ABNORMAL LOW (ref 8.7–10.2)
PTH: 146 pg/mL — ABNORMAL HIGH (ref 15–65)

## 2018-05-14 LAB — HEPATITIS PANEL, ACUTE
HCV Ab: <0.1 s/co ratio (ref 0.0–0.9)
Hep A IgM: NEGATIVE
Hep B C IgM: NEGATIVE
Hepatitis B Surface Ag: NEGATIVE

## 2018-05-14 LAB — MAGNESIUM: MAGNESIUM: 1.6 mg/dL — AB (ref 1.7–2.4)

## 2018-05-14 MED ORDER — REGADENOSON 0.4 MG/5ML IV SOLN
INTRAVENOUS | Status: AC
  Administered 2018-05-14: 0.4 mg via INTRAVENOUS
  Filled 2018-05-14: qty 5

## 2018-05-14 MED ORDER — ALBUMIN HUMAN 25 % IV SOLN
25.0000 g | Freq: Four times a day (QID) | INTRAVENOUS | Status: AC
  Administered 2018-05-14 – 2018-05-15 (×4): 25 g via INTRAVENOUS
  Filled 2018-05-14: qty 50
  Filled 2018-05-14: qty 100
  Filled 2018-05-14 (×2): qty 50

## 2018-05-14 MED ORDER — POTASSIUM CHLORIDE CRYS ER 20 MEQ PO TBCR
40.0000 meq | EXTENDED_RELEASE_TABLET | Freq: Once | ORAL | Status: AC
  Administered 2018-05-14: 40 meq via ORAL
  Filled 2018-05-14: qty 2

## 2018-05-14 MED ORDER — MAGNESIUM SULFATE 2 GM/50ML IV SOLN
2.0000 g | Freq: Once | INTRAVENOUS | Status: AC
  Administered 2018-05-14: 2 g via INTRAVENOUS
  Filled 2018-05-14: qty 50

## 2018-05-14 MED ORDER — REGADENOSON 0.4 MG/5ML IV SOLN
0.4000 mg | Freq: Once | INTRAVENOUS | Status: AC
  Administered 2018-05-14: 0.4 mg via INTRAVENOUS
  Filled 2018-05-14: qty 5

## 2018-05-14 MED ORDER — TECHNETIUM TC 99M TETROFOSMIN IV KIT
10.0000 | PACK | Freq: Once | INTRAVENOUS | Status: AC | PRN
  Administered 2018-05-14: 10 via INTRAVENOUS

## 2018-05-14 NOTE — Progress Notes (Signed)
Patient presented for Lexiscan. Tolerated procedure well. Pending final stress imaging result.  

## 2018-05-14 NOTE — Progress Notes (Signed)
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 [name] w/ right-sided chest pain with radiation to the right arm, nausea, and SOB after walking a short distance.  He also reported B LE swelling, paroxysmal nocturnal dyspnea, and bright red blood per rectum for a weeks duration.      Subjective:  Grumpy, yelling at [employer] and nursing staff using foul language, nausea is better no fevers      Assessment & Plan:   Active Problems:   Human immunodeficiency virus (HIV) disease ([place])   Malignant lymphomas of lymph nodes of head, face, and neck (HCC)   Hypertension   Chest pain   Tobacco use   Furuncle of gluteal region   CHF (congestive heart failure) (HCC)   Hypertensive urgency   GIB (gastrointestinal bleeding)   AKI (acute kidney injury) (HCC)   Heme + stool   Normocytic anemia   Pain of both hip joints   Goals of care, counseling/discussion   Palliative care by specialist   1)HIV/AIDS - Noncompliant with medication.- -Patient is HIV medication restarted.  CD4 count was 50  ID on board -Azithromycin 1200 mg weekly, dapsone weekly, awaiting G6PD -Prezcobix 800-150 mg daily -Tivicay 50 mg daily -Edurant 25 mg daily D/w Dr Baxter Flattery   2)Acute renal failure- - Multifactorial hypertensive nephropathy, HIV nephropathy.   -Discussed case with nephrology Dr. Carolin Sicks , patient may need HD , urine output remains good overall   Dr. Carolin Sicks believes that patient's renal function may improve with resumption of his HIV medication.  Lasix for lower extremity edema, significant concerns about patient's ability to be compliant with hemodialysis should he be initiated on HD  - Avoid nephrotoxic medication Recent Labs    Lab 05/10/18 1424 05/10/18 1749 05/11/18 1521 05/12/18 0902 05/13/18 0241 05/14/18 0253  CREATININE 5.82* 5.91* 5.92* 6.11* 7.10*  7.10* 7.36*    3)Acute systolic and diastolic CHF/chest pain.- Strict in and out, -Daily weight, - Transfuse for hemoglobin<8, -Cardiology consult appreciated, Myoview stress test on 05/14/2017 is abnormal, awaiting further cardiology input, , Amlodipine 10 mg daily, BiDil, -Coreg 12.5 mg BID - Lasix 40 mg IV twice a day  4)Uncontrolled essential HTN- - Multifactorial, but mostly due to noncompliance with medication, -On Norvasc, Coreg, Lasix, BiDil   5)GI bleed/BRBPR- -  Currently patient refuses colonoscopy., -GI seeing patient   6)Normocytic anemia -Anemia panel consistent with anemia of chronic disease.  Suspect a combination of AIDS +Renal disease does not appear to have significant GI bleed. -Occult blood pending    7)Chronic boil of buttocks -Hx of chronic "boil" of buttock -I&D in ED.  Culture not sent   8)Polysubstance abuse - 6/25 UDS positive marijuana  9)Goal of care -6/26 PALLIATIVE CARE: Noncompliant patient with HIV, acute systolic CHF, acute renal failure possibly going on HD discussed change of status to DNR, discussed short-term vs long-term goals of care     DVT prophylaxis: SCD Code Status: Full Family Communication: None Disposition Plan:  Not Stable for discharge   Consultants:  Cardiology ID Nephrology GI   Procedures/Significant Events:  6/25 echocardiogram:Left ventricle: LVEF =45% to 50%. Diffuse hypokinesis.- Grade 1   diastolic dysfunction. - Left  atrium:  moderately dilated.  05/14/2018 abnormal Myoview stress test   Cultures 6/24 MRSA PCR negative  Antimicrobials: Anti-infectives (From admission, onward)   Start     Stop   05/12/18 0730  darunavir-cobicistat (PREZCOBIX) 800-150 MG per tablet 1 tablet         05/12/18 0730  dolutegravir (TIVICAY) tablet 50 mg         05/12/18 0730  rilpivirine  (EDURANT) tablet 25 mg         05/12/18 0700  dolutegravir (TIVICAY) tablet 50 mg  Status:  Discontinued     05/12/18 0047   05/12/18 0700  rilpivirine (EDURANT) tablet 25 mg  Status:  Discontinued     05/12/18 0047   05/12/18 0700  darunavir-cobicistat (PREZCOBIX) 800-150 MG per tablet 1 tablet  Status:  Discontinued     05/12/18 0047   05/11/18 1015  azithromycin (ZITHROMAX) tablet 1,200 mg         05/11/18 1015  sulfamethoxazole-trimethoprim (BACTRIM,SEPTRA) 400-80 MG per tablet 1 tablet  Status:  Discontinued     05/11/18 1228   05/11/18 0800  elvitegravir-cobicistat-emtricitabine-tenofovir (GENVOYA) 150-150-200-10 MG tablet 1 tablet  Status:  Discontinued     05/10/18 1931   05/11/18 0800  darunavir (PREZISTA) tablet 800 mg  Status:  Discontinued     05/10/18 1931       Continuous Infusions: . sodium chloride Stopped (05/12/18 1526)  . albumin human 25 g (05/14/18 1315)     Objective: Vitals:   05/14/18 1055 05/14/18 1123 05/14/18 1125 05/14/18 1642  BP: (!) 185/115 (!) 188/104 (!) 182/103 (!) 178/88  Pulse:    81  Resp:    18  Temp:    98.2 F (36.8 C)  TempSrc:    Oral  SpO2:    99%  Weight:      Height:        Intake/Output Summary (Last 24 hours) at 05/14/2018 1854 Last data filed at 05/14/2018 1749 Gross per 24 hour  Intake 714.56 ml  Output 1475 ml  Net -760.44 ml   Filed Weights   05/12/18 0423 05/13/18 0500 05/14/18 0533  Weight: 85.5 kg (188 lb 7.9 oz) 84.4 kg (186 lb 1.1 oz) 84 kg (185 lb 3 oz)    Examination:  Physical Exam  Gen:- Awake Alert, very grumpy HEENT:- [place].AT, No sclera icterus Neck-Supple Neck,No JVD,.  Lungs-   no wheezing, fair air movement CV- S1, S2 normal Abd-  +ve B.Sounds, Abd Soft, No tenderness,    Extremity/Skin:-1-2+ pitting edema,   Skin: Boil on the right ureter buttocks at the crack scab in place Psych-affect is appropriate, oriented x3 Neuro-no new focal deficits, no tremors .  Marland Kitchen     Data Reviewed: Care  during the described time interval was provided by me .  I have reviewed this patient's available data, including medical history, events of note, physical examination, and all test results as part of my evaluation.   CBC: Recent Labs  Lab 05/10/18 1424 05/12/18 1038 05/13/18 0241 05/14/18 0253  WBC 4.8  --  6.0 7.4  HGB 7.2* 8.9* 8.2* 9.0*  HCT 22.9*  --  25.4* 27.9*  MCV 84.2  --  86.1 86.4  PLT 272  --  259 161   Basic Metabolic Panel: Recent Labs  Lab 05/10/18 1749 05/11/18 1521 05/12/18 0902 05/13/18 0241 05/14/18 0253  NA 141 141 140 143  143 142  K 3.8 3.9 3.4* 3.6  3.6 3.4*  CL 113* 117* 115*  116*  116* 114*  CO2 15* 13* 15* 16*  17* 17*  GLUCOSE 102* 137* 109* 95  95 93  BUN 75* 71* 66* 65*  66* 62*  CREATININE 5.91* 5.92* 6.11* 7.10*  7.10* 7.36*  CALCIUM 7.3* 7.5* 7.8* 7.5*  7.6*  7.3* 7.8*  MG  --   --  1.4* 1.8 1.6*  PHOS  --  7.2*  --  6.4* 6.4*   GFR: Estimated Creatinine Clearance: 11.2 mL/min (A) (by C-G formula based on SCr of 7.36 mg/dL (H)). Liver Function Tests: Recent Labs  Lab 05/10/18 1749 05/11/18 1521 05/13/18 0241 05/14/18 0253  AST 26  --  21  --   ALT 16*  --  13  --   ALKPHOS 36*  --  40  --   BILITOT 0.5  --  0.5  --   PROT 6.2*  --  5.7*  --   ALBUMIN 1.5* 1.8* 1.8*  1.8* 1.9*   Recent Labs  Lab 05/13/18 0241  LIPASE 93*   No results for input(s): AMMONIA in the last 168 hours. Coagulation Profile: Recent Labs  Lab 05/10/18 1749  INR 1.04   Cardiac Enzymes: Recent Labs  Lab 05/10/18 1906 05/10/18 2225  TROPONINI 0.15* 0.18*   BNP (last 3 results) No results for input(s): PROBNP in the last 8760 hours. HbA1C: No results for input(s): HGBA1C in the last 72 hours. CBG: No results for input(s): GLUCAP in the last 168 hours. Lipid Profile: No results for input(s): CHOL, HDL, LDLCALC, TRIG, CHOLHDL, LDLDIRECT in the last 72 hours. Thyroid Function Tests: No results for input(s): TSH, T4TOTAL, FREET4,  T3FREE, THYROIDAB in the last 72 hours. Anemia Panel: No results for input(s): VITAMINB12, FOLATE, FERRITIN, TIBC, IRON, RETICCTPCT in the last 72 hours. Urine analysis:    Component Value Date/Time   COLORURINE YELLOW 05/11/2018 0606   APPEARANCEUR HAZY (A) 05/11/2018 0606   LABSPEC 1.012 05/11/2018 0606   PHURINE 5.0 05/11/2018 0606   GLUCOSEU NEGATIVE 05/11/2018 0606   GLUCOSEU NEG mg/dL 11/23/2006 2036   HGBUR MODERATE (A) 05/11/2018 0606   BILIRUBINUR NEGATIVE 05/11/2018 0606   KETONESUR NEGATIVE 05/11/2018 0606   PROTEINUR >=300 (A) 05/11/2018 0606   UROBILINOGEN 0.2 11/23/2006 2036   NITRITE NEGATIVE 05/11/2018 0606   LEUKOCYTESUR NEGATIVE 05/11/2018 0606   Sepsis Labs: @LABRCNTIP (procalcitonin:4,lacticidven:4)  ) Recent Results (from the past 240 hour(s))  MRSA PCR Screening     Status: None   Collection Time: 05/10/18 11:00 PM  Result Value Ref Range Status   MRSA by PCR NEGATIVE NEGATIVE Final    Comment:        The GeneXpert MRSA Assay (FDA approved for NASAL specimens only), is one component of a comprehensive MRSA colonization surveillance program. It is not intended to diagnose MRSA infection nor to guide or monitor treatment for MRSA infections. Performed at [facility], [place] [address], [place], [place] [address]          Radiology Studies: Nm Myocar Multi W/spect W/wall Motion / Ef  Result Date: 05/14/2018 CLINICAL DATA:  57 year old with chest pain. Intermediate/high probability. EXAM: MYOCARDIAL IMAGING WITH SPECT (REST AND PHARMACOLOGIC-STRESS) GATED LEFT VENTRICULAR WALL MOTION STUDY LEFT VENTRICULAR EJECTION FRACTION TECHNIQUE: Standard myocardial SPECT imaging was performed after resting intravenous injection of 10 mCi Tc-91m tetrofosmin. Subsequently, intravenous infusion of Lexiscan was performed under the supervision of the Cardiology staff. At peak effect of the drug, 30 mCi Tc-6m tetrofosmin was injected intravenously and standard  myocardial SPECT imaging  was performed. Quantitative gated imaging was also performed to evaluate left ventricular wall motion, and estimate left ventricular ejection fraction. COMPARISON:  Chest radiograph 05/10/2018 FINDINGS: Perfusion: Mild to moderate reversibility along the septal wall, particularly in the mid segment. Findings are concerning for ischemia in this area. No other areas are concerning for reversibility or ischemia. There is fixed defect along the inferior wall which could be related to an infarct. Wall Motion: Diffuse hypokinesia throughout the left ventricle. Left ventricle is dilated. Left Ventricular Ejection Fraction: 38 % End diastolic volume 015 ml End systolic volume 615 ml IMPRESSION: 1. Mild to moderate reversibility involving the septal wall. Findings are suggestive for ischemia in this area. 2. Fixed defect along the inferior wall may be related to an infarct. 3. Left ventricular ejection fraction is 38% with diffuse hypokinesia in the left ventricle. 4. Non invasive risk stratification*: Intermediate *2012 Appropriate Use Criteria for Coronary Revascularization Focused Update: J Am Coll Cardiol. 3794;32(7):614-709. http://content.airportbarriers.com.aspx?articleid=[email or web address] Electronically Signed   By: Markus Daft M.D.   On: 05/14/2018 14:33        Scheduled Meds: . amLODipine  10 mg Oral Daily  . azithromycin  1,200 mg Oral Weekly  . carvedilol  12.5 mg Oral BID WC  . dapsone  100 mg Oral Daily  . darunavir-cobicistat  1 tablet Oral Q breakfast  . dolutegravir  50 mg Oral Q breakfast  . furosemide  40 mg Intravenous BID  . isosorbide-hydrALAZINE  1 tablet Oral TID  . rilpivirine  25 mg Oral Q breakfast  . senna-docusate  1 tablet Oral Daily  . sodium bicarbonate  1,300 mg Oral BID   Continuous Infusions: . sodium chloride Stopped (05/12/18 1526)  . albumin human 25 g (05/14/18 1315)     LOS: 4 days     Roxan Hockey, MD [facility]    If  7PM-7AM, please contact night-coverage [email or web address] Password [facility] 05/14/2018, 6:54 PM

## 2018-05-14 NOTE — Progress Notes (Addendum)
[facility] NEPHROLOGY PROGRESS NOTE  Assessment/ Plan: Pt is a 57 y.o. yo male with history of uncontrolled hypertension, HIV, T-cell lymphoma, polysubstance abuse, noncompliant with the medication including HIV medications presented from ID clinic for chest pain, shortness of breath and lower extremity edema.  Consulted for renal failure.  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 Normal renal function in 05/2017. 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  On admission patient had hypertensive urgency with systolic blood pressure more than 200s.  Patient has bilateral echogenic kidneys, anemia,  acidosis points towards underlying CKD. -Benazepril and Bactrim were listed as home medication however patient was not taking any medication at home. -Urinalysis with protein, RBCs and WBCs. Urine PCR 4.29, unable to use ACEI or ARB because of renal failure. Now on HIV medication. Hepatitis B and C serology negative.   -Receiving IV Lasix for the management of lower extremity edema and CHF.  -He has urine output of 1700 cc in 24 hours.  Serum creatinine level continued to elevate to 7.3.  Still has lower extremity edema.  We will continue IV Lasix today. Ordered IV albumin.  Monitor BMP.  He is likely heading towards requiring dialysis. Noted palliative care evaluation and cardiac stress test today.  Monitor BMP, urine output.   I have discussed with the patient regarding worsening renal failure and possible need of dialysis if continue to worsen.  Patient verbalized understanding and agreed for dialysis if needed.  I am not sure how compliant he will be with outpatient dialysis if needed.  -Avoid nephrotoxins.  #Hypertension: Monitor blood pressure.  Continue BiDil and amlodipine.  # LE edema DOE: Echo with EF of  45 to 50% with mildly reduced systolic function.  Plan for cardiac stress test today.  #Anemia: GI evaluation ongoing because of fecal occult blood test positive.  Iron saturation 44%.    Hemoglobin was 9.  Received dose of Aranesp on 6/25.  #Hypomagnesemia: Improved.  Reordered a dose of magnesium and potassium.  #Nausea vomiting and decreased oral intake: GI evaluation ongoing.  Clinically better today.  Subjective: Seen and examined at bedside.  Having intermittent chest pain.  Plan for cardiac test today.  Denied nausea vomiting headache or dizziness.  No urinary complaints.   Objective Vital signs in last 24 hours: Vitals:   05/14/18 0027 05/14/18 0306 05/14/18 0533 05/14/18 0700  BP: 137/81 (!) 164/81 (!) 163/100 (!) 165/78  Pulse: 79  82 78  Resp: 20  19 19   Temp: 98.8 F (37.1 C)  98.1 F (36.7 C) 98.5 F (36.9 C)  TempSrc: Oral  Oral Oral  SpO2: 99%  99% 98%  Weight:   84 kg (185 lb 3 oz)   Height:       Weight change: -0.4 kg (-14.1 oz)  Intake/Output Summary (Last 24 hours) at 05/14/2018 1048 Last data filed at 05/14/2018 0535 Gross per 24 hour  Intake 880 ml  Output 1700 ml  Net -820 ml       Labs: Basic Metabolic Panel: Recent Labs  Lab 05/11/18 1521 05/12/18 0902 05/13/18 0241 05/14/18 0253  NA 141 140 143  143 142  K 3.9 3.4* 3.6  3.6 3.4*  CL 117* 115* 116*  116* 114*  CO2 13* 15* 16*  17* 17*  GLUCOSE 137* 109* 95  95 93  BUN 71* 66* 65*  66*  62*  CREATININE 5.92* 6.11* 7.10*  7.10* 7.36*  CALCIUM 7.5* 7.8* 7.5*  7.6*  7.3* 7.8*  PHOS 7.2*  --  6.4* 6.4*   Liver Function Tests: Recent Labs  Lab 05/10/18 1749 05/11/18 1521 05/13/18 0241 05/14/18 0253  AST 26  --  21  --   ALT 16*  --  13  --   ALKPHOS 36*  --  40  --   BILITOT 0.5  --  0.5  --   PROT 6.2*  --  5.7*  --   ALBUMIN 1.5* 1.8* 1.8*  1.8* 1.9*   Recent Labs  Lab 05/13/18 0241  LIPASE 93*   No results for input(s): AMMONIA in the last 168  hours. CBC: Recent Labs  Lab 05/10/18 1424 05/12/18 1038 05/13/18 0241 05/14/18 0253  WBC 4.8  --  6.0 7.4  HGB 7.2* 8.9* 8.2* 9.0*  HCT 22.9*  --  25.4* 27.9*  MCV 84.2  --  86.1 86.4  PLT 272  --  259 266   Cardiac Enzymes: Recent Labs  Lab 05/10/18 1906 05/10/18 2225  TROPONINI 0.15* 0.18*   CBG: No results for input(s): GLUCAP in the last 168 hours.  Iron Studies:  No results for input(s): IRON, TIBC, TRANSFERRIN, FERRITIN in the last 72 hours. Studies/Results: Dg Abd Portable 1v  Result Date: 05/12/2018 CLINICAL DATA:  Nausea, vomiting, and diarrhea today. Also left lower quadrant pain. History of HIV, lymphoma, CHF. EXAM: PORTABLE ABDOMEN - 1 VIEW COMPARISON:  Abdominal and pelvic CT scan of May 20, 2017 FINDINGS: The bowel gas pattern is within the limits of normal. There are no abnormal soft tissue calcifications. 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 The lumbar spine and visualized portions of the pelvis are unremarkable. IMPRESSION: No acute intra-abdominal or pelvic abnormality is observed. Electronically Signed   By: David  Martinique M.D.   On: 05/12/2018 12:34    Medications: Infusions: . sodium chloride Stopped (05/12/18 1526)    Scheduled Medications: . amLODipine  10 mg Oral Daily  . azithromycin  1,200 mg Oral Weekly  . carvedilol  12.5 mg Oral BID WC  . dapsone  100 mg Oral Daily  . darunavir-cobicistat  1 tablet Oral Q breakfast  . dolutegravir  50 mg Oral Q breakfast  . furosemide  40 mg Intravenous BID  . isosorbide-hydrALAZINE  1 tablet Oral TID  . rilpivirine  25 mg Oral Q breakfast  . senna-docusate  1 tablet Oral Daily  . sodium bicarbonate  1,300 mg Oral BID    have reviewed scheduled and prn medications.  Physical Exam: General: Not in distress Heart: Regular rate rhythm S1-S2 normal, no rubs Lungs: Clear bilateral, no crackle Abdomen:, Nontender, nondistended Extremities: Bilateral lower extremity 1+ edema.  Unchanged   Dron  Prasad Bhandari 05/14/2018,10:48 AM  LOS: 4 days

## 2018-05-14 NOTE — Care Management Important Message (Signed)
Important Message  Patient Details  Name: [name] MRN: [id number] Date of Birth: 1961-07-20   Medicare Important Message Given:  Yes    [name] [name] 05/14/2018, 1:42 PM

## 2018-05-14 NOTE — Progress Notes (Signed)
DAILY PROGRESS NOTE   Patient Name: [name] Date of Encounter: 05/14/2018  Chief Complaint   No chest pain overnight, still with abd pain  Patient Profile   [name] is a 57 y.o. male with a hx of uncontrolled hypertension, HIV disease and noncompliance with antiviral therapy, T-cell lymphoma treated with radiation therapy, remote history of polysubstance abuse, tobacco and alcohol abuse, hip AVN s/p hip replacement who is being seen today for the evaluation of chest pain and shortness of breath at the request of Dr. Thereasa Solo.  Subjective   Refused myoview yesterday since he would have to be NPO and miss lunch. Rescheduled for today. Net negative another 1L overnight. Noted to be hypokalemic and hypomagnesemic again today. Creatinine continues to rise - expect he will need dialysis.  Objective   Vitals:   05/14/18 0027 05/14/18 0306 05/14/18 0533 05/14/18 0700  BP: 137/81 (!) 164/81 (!) 163/100 (!) 165/78  Pulse: 79  82 78  Resp: '20  19 19  ' Temp: 98.8 F (37.1 C)  98.1 F (36.7 C) 98.5 F (36.9 C)  TempSrc: Oral  Oral Oral  SpO2: 99%  99% 98%  Weight:   185 lb 3 oz (84 kg)   Height:        Intake/Output Summary (Last 24 hours) at 05/14/2018 0847 Last data filed at 05/14/2018 0535 Gross per 24 hour  Intake 880 ml  Output 1700 ml  Net -820 ml   Filed Weights   05/12/18 0423 05/13/18 0500 05/14/18 0533  Weight: 188 lb 7.9 oz (85.5 kg) 186 lb 1.1 oz (84.4 kg) 185 lb 3 oz (84 kg)    Physical Exam   General appearance: alert and mild distress Neck: no carotid bruit, no JVD and thyroid not enlarged, symmetric, no tenderness/mass/nodules Lungs: diminished breath sounds bilaterally Heart: regular rate and rhythm Abdomen: mild TTP diffusely over the LLQ and RLQ, no rebound, +guarding Extremities: extremities normal, atraumatic, no cyanosis or edema Pulses: 2+ and symmetric Skin: Skin color, texture, turgor normal. No rashes or lesions Neurologic: Grossly  normal Psych: In pain   No interval changes in physical exam overnight  Inpatient Medications    Scheduled Meds: . amLODipine  10 mg Oral Daily  . azithromycin  1,200 mg Oral Weekly  . carvedilol  12.5 mg Oral BID WC  . dapsone  100 mg Oral Daily  . darunavir-cobicistat  1 tablet Oral Q breakfast  . dolutegravir  50 mg Oral Q breakfast  . furosemide  40 mg Intravenous BID  . isosorbide-hydrALAZINE  1 tablet Oral TID  . rilpivirine  25 mg Oral Q breakfast  . senna-docusate  1 tablet Oral Daily  . sodium bicarbonate  1,300 mg Oral BID    Continuous Infusions: . sodium chloride Stopped (05/12/18 1526)    PRN Meds: sodium chloride, acetaminophen **OR** [DISCONTINUED] acetaminophen, bisacodyl, HYDROcodone-acetaminophen, labetalol, ondansetron **OR** ondansetron (ZOFRAN) IV, senna-docusate, traMADol   Labs   Results for orders placed or performed during the hospital encounter of 05/10/18 (from the past 48 hour(s))  Basic metabolic panel     Status: Abnormal   Collection Time: 05/12/18  9:02 AM  Result Value Ref Range   Sodium 140 135 - 145 mmol/L   Potassium 3.4 (L) 3.5 - 5.1 mmol/L   Chloride 115 (H) 98 - 111 mmol/L    Comment: Please note change in reference range.   CO2 15 (L) 22 - 32 mmol/L   Glucose, Bld 109 (H) 70 - 99  mg/dL    Comment: Please note change in reference range.   BUN 66 (H) 6 - 20 mg/dL    Comment: Please note change in reference range.   Creatinine, Ser 6.11 (H) 0.61 - 1.24 mg/dL   Calcium 7.8 (L) 8.9 - 10.3 mg/dL   GFR calc non Af Amer 9 (L) >60 mL/min   GFR calc Af Amer 11 (L) >60 mL/min    Comment: (NOTE) The eGFR has been calculated using the CKD EPI equation. This calculation has not been validated in all clinical situations. eGFR's persistently <60 mL/min signify possible Chronic Kidney Disease.    Anion gap 10 5 - 15    Comment: Performed at [facility] [address] [place], [place] [address]  Magnesium     Status: Abnormal    Collection Time: 05/12/18  9:02 AM  Result Value Ref Range   Magnesium 1.4 (L) 1.7 - 2.4 mg/dL    Comment: Performed at [facility] [address], [place], [place] [address]  Glucose 6 phosphate dehydrogenase     Status: Abnormal   Collection Time: 05/12/18 10:38 AM  Result Value Ref Range   Hemoglobin 8.9 (L) 13.0 - 17.7 g/dL   G6PDH 11.5 4.6 - 13.5 U/g Hb    Comment: (NOTE) When decreased, G-6-PD, Quant. values are associated with acute hemolytic anemia when deficient individuals are exposed to oxidative stress, such as with certain medications (e.g., primaquine), infection, or ingestion of fava beans. Caution: In patients with acute hemolysis (e.g., abnormally low RBC values), testing for G-6-PD may be falsely normal because older erythrocytes with a higher enzyme deficiency have been hemolyzed. Young erythrocytes and reticulocytes have normal or near-normal enzyme activity. Normal values of G-6-PD may be measured for several weeks following a hemolytic event. Performed At: [facility] [place], [place] [id number] Rush Farmer MD [id number] Performed at [facility], [place] [address], [place], [place] [address]   Toxoplasma antibodies- IgG and  IgM     Status: None   Collection Time: 05/12/18 10:45 AM  Result Value Ref Range   Toxoplasma IgG Ratio <3.0 0.0 - 7.1 IU/mL    Comment: (NOTE)                                 Negative        <7.2                                 Equivocal  7.2 - 8.7                                 Positive        >8.7    Toxoplasma Antibody- IgM <3.0 0.0 - 7.9 AU/mL    Comment: (NOTE)                             Negative            <8.0                             Equivocal      8.0 - 9.9  Positive            >9.9    Comment Comment     Comment: (NOTE) No serological evidence of infection with Toxoplasma. If symptoms persist, submit a new specimen after three weeks. Performed  At: [facility] [place], [place] [id number] Rush Farmer MD [id number] Performed at [facility], [place] [address] [place], [place] [address]   Protein / creatinine ratio, urine     Status: Abnormal   Collection Time: 05/12/18  4:10 PM  Result Value Ref Range   Creatinine, Urine 48.68 mg/dL   Total Protein, Urine 209 mg/dL    Comment: RESULTS CONFIRMED BY MANUAL DILUTION   Protein Creatinine Ratio 4.29 (H) 0.00 - 0.15 mg/mg[Cre]    Comment: Performed at [facility], [place] [address], [place], [place] [address]  PTH, intact and calcium     Status: Abnormal   Collection Time: 05/13/18  2:41 AM  Result Value Ref Range   PTH 146 (H) 15 - 65 pg/mL   Calcium, Total (PTH) 7.3 (L) 8.7 - 10.2 mg/dL   PTH Interp Comment     Comment: (NOTE) Interpretation                 Intact PTH    Calcium                                (pg/mL)      (mg/dL) Normal                          15 - 65     8.6 - 10.2 Primary Hyperparathyroidism         >65          >10.2 Secondary Hyperparathyroidism       >65          <10.2 Non-Parathyroid Hypercalcemia       <65          >10.2 Hypoparathyroidism                  <15          < 8.6 Non-Parathyroid Hypocalcemia    15 - 65          < 8.6 Performed At: [facility] [place], [place] [id number] Rush Farmer MD SA:[id number] Performed at [facility], [place] [address] [place], [place] [address]   Renal function panel     Status: Abnormal   Collection Time: 05/13/18  2:41 AM  Result Value Ref Range   Sodium 143 135 - 145 mmol/L   Potassium 3.6 3.5 - 5.1 mmol/L   Chloride 116 (H) 98 - 111 mmol/L    Comment: Please note change in reference range.   CO2 17 (L) 22 - 32 mmol/L   Glucose, Bld 95 70 - 99 mg/dL    Comment: Please note change in reference range.   BUN 66 (H) 6 - 20 mg/dL    Comment: Please note change in reference range.   Creatinine, Ser 7.10 (H) 0.61 - 1.24 mg/dL   Calcium 7.6  (L) 8.9 - 10.3 mg/dL   Phosphorus 6.4 (H) 2.5 - 4.6 mg/dL   Albumin 1.8 (L) 3.5 - 5.0 g/dL   GFR calc non Af Amer 8 (L) >60 mL/min   GFR calc Af Amer 9 (L) >60 mL/min  Comment: (NOTE) The eGFR has been calculated using the CKD EPI equation. This calculation has not been validated in all clinical situations. eGFR's persistently <60 mL/min signify possible Chronic Kidney Disease.    Anion gap 10 5 - 15    Comment: Performed at [facility] [address], [place], [place] [address]  Hepatitis panel, acute     Status: None   Collection Time: 05/13/18  2:41 AM  Result Value Ref Range   Hepatitis B Surface Ag Negative Negative   HCV Ab <0.1 0.0 - 0.9 s/co ratio    Comment: (NOTE)                                  Negative:     < 0.8                             Indeterminate: 0.8 - 0.9                                  Positive:     > 0.9 The CDC recommends that a positive HCV antibody result be followed up with a HCV Nucleic Acid Amplification test (357017). Performed At: [facility] [place], [place] [id number] Rush Farmer MD [id number]    Hep A IgM Negative Negative   Hep B C IgM Negative Negative    Comment: Performed at [facility], [place] [address], [place], [place] [address]  Lipase, blood     Status: Abnormal   Collection Time: 05/13/18  2:41 AM  Result Value Ref Range   Lipase 93 (H) 11 - 51 U/L    Comment: Performed at [facility] [address], [place], [place] [address]  Comprehensive metabolic panel     Status: Abnormal   Collection Time: 05/13/18  2:41 AM  Result Value Ref Range   Sodium 143 135 - 145 mmol/L   Potassium 3.6 3.5 - 5.1 mmol/L   Chloride 116 (H) 98 - 111 mmol/L    Comment: Please note change in reference range.   CO2 16 (L) 22 - 32 mmol/L   Glucose, Bld 95 70 - 99 mg/dL    Comment: Please note change in reference range.   BUN 65 (H) 6 - 20 mg/dL    Comment: Please note change in reference range.    Creatinine, Ser 7.10 (H) 0.61 - 1.24 mg/dL   Calcium 7.5 (L) 8.9 - 10.3 mg/dL   Total Protein 5.7 (L) 6.5 - 8.1 g/dL   Albumin 1.8 (L) 3.5 - 5.0 g/dL   AST 21 15 - 41 U/L   ALT 13 0 - 44 U/L    Comment: Please note change in reference range.   Alkaline Phosphatase 40 38 - 126 U/L   Total Bilirubin 0.5 0.3 - 1.2 mg/dL   GFR calc non Af Amer 8 (L) >60 mL/min   GFR calc Af Amer 9 (L) >60 mL/min    Comment: (NOTE) The eGFR has been calculated using the CKD EPI equation. This calculation has not been validated in all clinical situations. eGFR's persistently <60 mL/min signify possible Chronic Kidney Disease.    Anion gap 11 5 - 15    Comment: Performed at [facility] [address], [place],  [address]  Magnesium     Status: None   Collection Time: 05/13/18  2:41 AM  Result Value Ref Range   Magnesium 1.8 1.7 - 2.4 mg/dL    Comment: Performed at [facility], [place] [address], [place], [place] [address]  CBC     Status: Abnormal   Collection Time: 05/13/18  2:41 AM  Result Value Ref Range   WBC 6.0 4.0 - 10.5 K/uL   RBC 2.95 (L) 4.22 - 5.81 MIL/uL   Hemoglobin 8.2 (L) 13.0 - 17.0 g/dL   HCT 25.4 (L) 39.0 - 52.0 %   MCV 86.1 78.0 - 100.0 fL   MCH 27.8 26.0 - 34.0 pg   MCHC 32.3 30.0 - 36.0 g/dL   RDW 17.1 (H) 11.5 - 15.5 %   Platelets 259 150 - 400 K/uL    Comment: Performed at [facility], [place] [address] [place], [place] [address]  Renal function panel     Status: Abnormal   Collection Time: 05/14/18  2:53 AM  Result Value Ref Range   Sodium 142 135 - 145 mmol/L   Potassium 3.4 (L) 3.5 - 5.1 mmol/L   Chloride 114 (H) 98 - 111 mmol/L    Comment: Please note change in reference range.   CO2 17 (L) 22 - 32 mmol/L   Glucose, Bld 93 70 - 99 mg/dL    Comment: Please note change in reference range.   BUN 62 (H) 6 - 20 mg/dL    Comment: Please note change in reference range.   Creatinine, Ser 7.36 (H) 0.61 - 1.24 mg/dL   Calcium 7.8 (L) 8.9 - 10.3 mg/dL    Phosphorus 6.4 (H) 2.5 - 4.6 mg/dL   Albumin 1.9 (L) 3.5 - 5.0 g/dL   GFR calc non Af Amer 7 (L) >60 mL/min   GFR calc Af Amer 9 (L) >60 mL/min    Comment: (NOTE) The eGFR has been calculated using the CKD EPI equation. This calculation has not been validated in all clinical situations. eGFR's persistently <60 mL/min signify possible Chronic Kidney Disease.    Anion gap 11 5 - 15    Comment: Performed at [facility] [address] [place], [place] [address]  CBC     Status: Abnormal   Collection Time: 05/14/18  2:53 AM  Result Value Ref Range   WBC 7.4 4.0 - 10.5 K/uL   RBC 3.23 (L) 4.22 - 5.81 MIL/uL   Hemoglobin 9.0 (L) 13.0 - 17.0 g/dL   HCT 27.9 (L) 39.0 - 52.0 %   MCV 86.4 78.0 - 100.0 fL   MCH 27.9 26.0 - 34.0 pg   MCHC 32.3 30.0 - 36.0 g/dL   RDW 17.2 (H) 11.5 - 15.5 %   Platelets 266 150 - 400 K/uL    Comment: Performed at [facility], [place] [address], [place], [place] [address]  Magnesium     Status: Abnormal   Collection Time: 05/14/18  2:53 AM  Result Value Ref Range   Magnesium 1.6 (L) 1.7 - 2.4 mg/dL    Comment: Performed at [facility] [address], [place],  [address]    ECG   N/A  Telemetry   Sinus rhythm - Personally Reviewed  Radiology    Dg Abd Portable 1v  Result Date: 05/12/2018 CLINICAL DATA:  Nausea, vomiting, and diarrhea today. Also left lower quadrant pain. History of HIV, lymphoma, CHF. EXAM: PORTABLE ABDOMEN - 1 VIEW COMPARISON:  Abdominal and pelvic CT scan of May 20, 2017 FINDINGS: The bowel gas pattern is within the limits of normal. There are no abnormal soft tissue calcifications. 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 The lumbar spine and visualized portions of the pelvis are unremarkable. IMPRESSION: No acute intra-abdominal or pelvic abnormality is observed. Electronically Signed   By: David  Martinique M.D.   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 GFR<10 - nephrology is on board, possible dialysis. Plan for lexiscan myoview today. Keep NPO until after the test.   Time Spent Directly with Patient:  I have spent a total of 15 minutes with the patient reviewing hospital notes, telemetry, EKGs, labs and examining the patient as well as establishing an assessment and plan that was discussed personally with the patient. > 50% of time was spent in direct patient care.  Length of Stay:  LOS: 4 days   Pixie Casino, MD, [facility], [place] Director of the Advanced Lipid Disorders &  Cardiovascular Risk Reduction Clinic Diplomate of the American Board of Clinical Lipidology Attending Cardiologist  Direct Dial: [phone]  Fax: [phone]  Website:  [email or web address] 05/14/2018, 8:47 AM

## 2018-05-14 NOTE — Progress Notes (Signed)
[facility] for Infectious Disease    Date of Admission:  05/10/2018   Total days of antibiotics   ID: [name] is a 57 y.o. male with poorly controlled hiv disease, CD 4 count of 50. Admitted for hypertensive urgency/emergency, with subacute AKI, marked LE edema, and malnutrition. 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 He states nausea is improved with zofran this morning, though he did have nausea yesterday and didn't tolerate taking medicaitons.   Medications:  . amLODipine  10 mg Oral Daily  . azithromycin  1,200 mg Oral Weekly  . carvedilol  12.5 mg Oral BID WC  . dapsone  100 mg Oral Daily  . darunavir-cobicistat  1 tablet Oral Q breakfast  . dolutegravir  50 mg Oral Q breakfast  . furosemide  40 mg Intravenous BID  . isosorbide-hydrALAZINE  1 tablet Oral TID  . rilpivirine  25 mg Oral Q breakfast  . senna-docusate  1 tablet Oral Daily  . sodium bicarbonate  1,300 mg Oral BID    Objective: Vital signs in last 24 hours: Temp:  [97.8 F (36.6 C)-98.8 F (37.1 C)] 98.5 F (36.9 C) (06/28 0700) Pulse Rate:  [72-82] 78 (06/28 0700) Resp:  [13-20] 19 (06/28 0700) BP: (129-188)/(75-115) 182/103 (06/28 1125) SpO2:  [98 %-100 %] 98 % (06/28 0700) Weight:  [185 lb 3 oz (84 kg)] 185 lb 3 oz (84 kg) (06/28 0533) Physical Exam  Constitutional: He is oriented to person, place, and time. He appears well-developed and well-nourished. No distress. Watching and laughing at TV HENT:    Mouth/Throat: Oropharynx is clear and moist. No oropharyngeal exudate.  Cardiovascular: Normal rate, regular rhythm and normal heart sounds. Exam reveals no gallop and no friction rub.  No murmur heard.  Pulmonary/Chest: Effort normal and breath sounds normal. No respiratory distress. He has no wheezes.  Abdominal: Soft. Bowel sounds are normal. He exhibits no distension. There is no tenderness.  Ext: 2+ edema LE bilaterally Neurological: He is alert and oriented to person, place, and time.  Skin: Skin is warm and dry. No rash noted. No erythema.  Psychiatric: He has a normal mood and affect. His behavior is normal.    Lab Results Recent Labs    05/13/18 0241 05/14/18 0253  WBC 6.0 7.4  HGB 8.2* 9.0*  HCT 25.4* 27.9*  NA 143  143 142  K 3.6  3.6 3.4*  CL 116*  116* 114*  CO2 16*  17* 17*  BUN 65*  66* 62*  CREATININE 7.10*  7.10* 7.36*   Liver Panel Recent Labs    05/13/18 0241 05/14/18 0253  PROT 5.7*  --   ALBUMIN 1.8*  1.8* 1.9*  AST 21  --   ALT 13  --   ALKPHOS 40  --   BILITOT 0.5  --     Microbiology: reviewed Studies/Results: Nm Myocar Multi W/spect W/wall Motion / Ef  Result Date: 05/14/2018 CLINICAL DATA:  57 year old with chest pain. Intermediate/high probability. EXAM: MYOCARDIAL IMAGING WITH SPECT (REST AND PHARMACOLOGIC-STRESS) GATED LEFT VENTRICULAR WALL MOTION STUDY LEFT VENTRICULAR EJECTION FRACTION TECHNIQUE: Standard myocardial SPECT imaging was performed after resting intravenous injection of 10 mCi Tc-37m tetrofosmin. Subsequently, intravenous infusion of Lexiscan was performed under the supervision of the Cardiology staff. At peak effect of the drug, 30 mCi Tc-15m tetrofosmin was injected intravenously and standard myocardial SPECT imaging was performed. Quantitative gated imaging was also performed to evaluate left ventricular wall motion, and estimate left ventricular ejection fraction. COMPARISON:  Chest radiograph 05/10/2018 FINDINGS:  Perfusion: Mild to moderate reversibility along the septal wall, particularly in the mid segment. Findings are concerning for ischemia in this area. No other areas are concerning for reversibility or ischemia. There is fixed defect along the inferior wall which could be related to an infarct. Wall Motion: Diffuse hypokinesia throughout the left ventricle. Left ventricle is dilated. Left Ventricular Ejection Fraction: 38 % End diastolic volume 697 ml End systolic volume 948 ml IMPRESSION: 1. Mild to moderate reversibility involving the septal wall. Findings are suggestive for ischemia in this area. 2. Fixed defect along the inferior wall may be related to an infarct. 3. Left ventricular ejection fraction is 38% with diffuse hypokinesia in the left ventricle. 4. Non invasive risk stratification*: Intermediate *2012 Appropriate Use Criteria for Coronary Revascularization Focused Update: J Am Coll Cardiol. 0165;53(7):482-707. http://content.airportbarriers.com.aspx?articleid=[email or web address] Electronically Signed   By: Markus Daft M.D.   On: 05/14/2018 14:33     Assessment/Plan: hiv disease= has hx of poor compliance. We have placed him on tivicay,prezcobix plus rilpivirine. Moved the timing to dinner time so that it is with meals and that he is not having large pill burden with other meds. I am waiting for his [id number] to return, since I will probably prefer to do tivicay-abacavir-lamivudine- unclear if he will consistently eat and meet caloric requirement for RPV.  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 He has hx of noncompliance that makes me favor PI based regimen but I question he understands the importance to take his meds  htn = defer to primary team/renal for management  CKD = sometime in the course of this year, has had insult to kidneys. Currently continuing to monitor to see if will meet criteria for HD. Thus far, making sufficient urine  Dr Tommy Medal to see over the weekend   [facility]  for Infectious Diseases Cell: [phone] Pager: [phone]  05/14/2018, 3:28 PM

## 2018-05-14 NOTE — Consult Note (Signed)
Consultation Note Date: 05/14/2018   Patient Name: [name]  DOB: 22-Feb-1961  MRN: [id number]  Age / Sex: 57 y.o., male  PCP: [name], MD Referring Physician: Roxan Hockey, MD  Reason for Consultation: Establishing goals of care and Psychosocial/spiritual support  HPI/Patient Profile: 57 y.o. 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  Patient was on his way to see his infectious disease doctor when he developed worsening symptoms and states and "I woke up in here".  He reported having the symptoms for approximately 2 weeks, with shortness of breath.  He states he has not been taking his infectious disease medicine for approx 2 years secondary to inability to afford medication.  His CD4 count is 80.  Upon presentation patient was found to have a new onset of severe acute kidney injury.  Upon admission his creatinine was 5.82.  He is still making urine.  As of 05/14/2018 his creatinine is now 7.36.  Consult ordered for goals of care  Clinical Assessment and Goals of Care: Patient, chart reviewed.  Alert and oriented x4 and able to make his own healthcare decisions.  He does have one adult son who would be his healthcare proxy in the event that he were unable to speak for himself who resides near [place].  Introduced palliative medicine services.  Reviewed CODE STATUS, defined terms.  Patient is a full code by choice.  He does describe an episode approximately 10 years ago when he had bilateral pneumonia which required intubation.  He states "they were asking my mother to pull the plug" and she did not and he obviously survived.  He is verbalizing that he sees the importance of  being compliant with his HIV medicine and to stay in touch with his physicians regarding side effects as well as getting on patient assistance programs to help him be compliant.  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  He is verbalizing willingness to go to hemodialysis both short-term as well as long-term if necessary.  He is also verbalizing that full code, full scope of treatment is his choice at this point.  He does recognize he has serious illnesses but at this point "I am grateful to be alive, and want to stay that way".  At this point patient is able to speak for himself in terms of healthcare decisions.  Were he unable to do so, he does have an adult son who would legally be his healthcare proxy.  A Mr. Chipper Herb is listed under demographics section of Epic, and this gentleman is his best friend    SUMMARY OF RECOMMENDATIONS   Full code, full scope of treatment Able to hemodialysis both short-term as  well as long-term Can case management/social work assist with patient assistance programs to receive antiviral medications? Code Status/Advance Care Planning:  Full code    Symptom Management:   Hip pain: Continue with Norco 5/325 1 to 2 tablets every 4 hours as needed for your pain  Palliative Prophylaxis:   Aspiration, Bowel Regimen, Delirium Protocol, Eye Care, Frequent Pain Assessment, Oral Care and Turn Reposition  Additional Recommendations (Limitations, Scope, Preferences):  Full Scope Treatment  Psycho-social/Spiritual:   Desire for further Chaplaincy support:no  Additional Recommendations: Referral to Community Resources   Prognosis:   Unable to determine  Discharge Planning: To Be Determined      Primary Diagnoses: Present on Admission: . Human immunodeficiency virus (HIV) disease ([place]) . 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 Tobacco use . Chest pain   I have reviewed the medical record, interviewed the patient and family, and examined the patient. The following aspects are pertinent.  Past Medical History:  Diagnosis Date  . 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 Candidiasis of mouth   . Chest pain, unspecified   . Diseases of lips    Angular cheilitis  . HIV infection ([place])   . Hypertension   . Lymphoma ([place])   . Personal history of unspecified disease of respiratory system   . 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 Shingles   . Tobacco use    Social History   Socioeconomic History  . Marital status: Single    Spouse name: Not on file  . Number of children: Not on file  . Years of education: Not on file  . Highest education level: Not on file  Occupational History  . Not on file  Social Needs  . Financial resource strain: Not on file  . Food insecurity:    Worry: Not on file    Inability: Not on file  . Transportation needs:    Medical: Not on file    Non-medical: Not on file  Tobacco Use  . Smoking status: Former Smoker    Packs/day: 1.00    Years: 35.00    Pack years: 35.00    Last attempt to quit: 11/23/2015    Years since quitting: 2.4  . Smokeless tobacco: Never Used  . Tobacco comment: would like help quitting  Substance and Sexual Activity  . Alcohol use: Yes    Alcohol/week: 0.0 oz    Comment: beer weekends  . Drug use: No  . Sexual activity: Yes    Partners: Female    Comment: pt. declined condoms  Lifestyle  . Physical activity:    Days per week: Not on file    Minutes per session: Not on file  . Stress: Not on file  Relationships  . Social connections:    Talks on phone: Not on file    Gets together: Not on file    Attends religious service: Not on file    Active member of club or organization: Not on file    Attends meetings of clubs or organizations: Not on file    Relationship status: Not on file  Other Topics Concern  . Not on  file  Social History Narrative   Single - unemployed   + EtOH weekend beer   Former smoker   + THC   Family History  Problem Relation Age of Onset  . Heart disease Mother   . Diabetes Mother   . Cancer Mother   .  Diabetes Sister   . CAD Sister        ? stents  . Diabetes Maternal Grandmother   . Hypertension Brother    Scheduled Meds: . amLODipine  10 mg Oral Daily  . azithromycin  1,200 mg Oral Weekly  . carvedilol  12.5 mg Oral BID WC  . dapsone  100 mg Oral Daily  . darunavir-cobicistat  1 tablet Oral Q breakfast  . dolutegravir  50 mg Oral Q breakfast  . furosemide  40 mg Intravenous BID  . isosorbide-hydrALAZINE  1 tablet Oral TID  . rilpivirine  25 mg Oral Q breakfast  . senna-docusate  1 tablet Oral Daily  . sodium bicarbonate  1,300 mg Oral BID   Continuous Infusions: . sodium chloride Stopped (05/12/18 1526)  . albumin human 25 g (05/14/18 1315)   PRN Meds:.sodium chloride, acetaminophen **OR** [DISCONTINUED] acetaminophen, bisacodyl, HYDROcodone-acetaminophen, labetalol, ondansetron **OR** ondansetron (ZOFRAN) IV, senna-docusate, traMADol Medications Prior to Admission:  Prior to Admission medications   Medication Sig Start Date End Date Taking? Authorizing Provider  benazepril (LOTENSIN) 5 MG tablet Take 1 tablet (5 mg total) by mouth daily. Patient not taking: Reported on 04/23/2017 01/19/17   [name], MD  elvitegravir-cobicistat-emtricitabine-tenofovir (GENVOYA) 150-150-200-10 MG TABS tablet Take 1 tablet by mouth daily with breakfast. Patient not taking: Reported on 04/23/2017 01/19/17   [name], MD  fluconazole (DIFLUCAN) 100 MG tablet Take 1 tablet by mouth daily for one week then switched to one tablet each week Patient not taking: Reported on 05/20/2017 04/23/17   [name], MD  magic mouthwash SOLN Take 5 mLs by mouth 3 (three) times daily as needed. Patient not taking: Reported on 04/23/2017 04/07/16   [name], MD    senna-docusate (SENOKOT-S) 8.6-50 MG per tablet Take 1 tablet by mouth daily. 05/26/12 05/26/13  [name], MD  sulfamethoxazole-trimethoprim (BACTRIM) 400-80 MG tablet Take 1 tablet by mouth 2 (two) times daily. Patient not taking: Reported on 05/20/2017 04/23/17   [name], MD   Allergies  Allergen Reactions  . Penicillins Anaphylaxis    Has patient had a PCN reaction causing immediate rash, facial/tongue/throat swelling, SOB or lightheadedness with hypotension: Unknown Has patient had a PCN reaction causing severe rash involving mucus membranes or skin necrosis: Unknown Has patient had a PCN reaction that required hospitalization: Unknown Has patient had a PCN reaction occurring within the last 10 years: No If all of the above answers are "NO", then may proceed with Cephalosporin use.   Review of Systems  Constitutional: Positive for fatigue.  HENT: Negative.   Eyes: Negative.   Respiratory: Positive for chest tightness and shortness of breath.   Cardiovascular: Positive for leg swelling.  Gastrointestinal: Positive for nausea.  Endocrine: Negative.   Genitourinary: Negative.   Musculoskeletal: Positive for back pain.  Neurological: Positive for weakness.  Hematological: Negative.   Psychiatric/Behavioral: Positive for agitation.    Physical Exam  Constitutional: He is oriented to person, place, and time.  Middle-aged man, sitting on the side of the bed, feeding himself lunch; in no acute distress  HENT:  Head: Normocephalic and atraumatic.  Neck: Normal range of motion.  Cardiovascular: Normal rate.  Pulmonary/Chest: Effort normal.  Abdominal: Soft.  Musculoskeletal: Normal range of motion.  Neurological: He is alert and oriented to person, place, and time.  Skin: Skin is warm and dry.  Psychiatric: He has a normal mood and affect. His behavior is normal. Judgment and thought content normal.  Nursing note and vitals reviewed.   Vital Signs: BP (!)  182/103   Pulse 78   Temp 98.5 F (36.9 C) (Oral)   Resp 19   Ht 5\' 5"  (1.651 m)   Wt 84 kg (185 lb 3 oz)   SpO2 98%   BMI 30.82 kg/m  Pain Scale: 0-10   Pain Score: 3    SpO2: SpO2: 98 % O2 Device:SpO2: 98 % O2 Flow Rate: .O2 Flow Rate (L/min): 2 L/min  IO: Intake/output summary:   Intake/Output Summary (Last 24 hours) at 05/14/2018 1451 Last data filed at 05/14/2018 1400 Gross per 24 hour  Intake 315 ml  Output 1700 ml  Net -1385 ml    LBM: Last BM Date: 05/12/18 Baseline Weight: Weight: 75.8 kg (167 lb) Most recent weight: Weight: 84 kg (185 lb 3 oz)     Palliative Assessment/Data:   Flowsheet Rows     Most Recent Value  Intake Tab  Referral Department  Hospitalist  Unit at Time of Referral  Med/Surg Unit  Palliative Care Primary Diagnosis  Cardiac  Date Notified  05/12/18  Reason for referral  Clarify Goals of Care  Date of Admission  05/10/18  Date first seen by Palliative Care  05/14/18  # of days Palliative referral response time  2 Day(s)  # of days IP prior to Palliative referral  2  Clinical Assessment  Palliative Performance Scale Score  50%  Pain Max last 24 hours  4  Pain Min Last 24 hours  0  Dyspnea Max Last 24 Hours  0  Dyspnea Min Last 24 hours  0  Nausea Max Last 24 Hours  0  Nausea Min Last 24 Hours  0  Anxiety Max Last 24 Hours  0  Anxiety Min Last 24 Hours  0  Other Max Last 24 Hours  0  Psychosocial & Spiritual Assessment  Palliative Care Outcomes  Patient/Family meeting held?  Yes  Who was at the meeting?  pt  Palliative Care Outcomes  Provided psychosocial or spiritual support, Clarified goals of care      Time In: 1400 Time Out: 1500 Time Total: 60 min Greater than 50%  of this time was spent counseling and coordinating care related to the above assessment and plan.  Signed by: Dory Horn, NP   Please contact Palliative Medicine Team phone at [phone] for questions and concerns.  For individual provider: See  Shea Evans

## 2018-05-15 DIAGNOSIS — Z7189 Other specified counseling: Secondary | ICD-10-CM

## 2018-05-15 DIAGNOSIS — C8591 Non-Hodgkin lymphoma, unspecified, lymph nodes of head, face, and neck: Secondary | ICD-10-CM

## 2018-05-15 DIAGNOSIS — N179 Acute kidney failure, unspecified: Secondary | ICD-10-CM

## 2018-05-15 LAB — CBC
HEMATOCRIT: 25.3 % — AB (ref 39.0–52.0)
HEMOGLOBIN: 8.2 g/dL — AB (ref 13.0–17.0)
MCH: 28.1 pg (ref 26.0–34.0)
MCHC: 32.4 g/dL (ref 30.0–36.0)
MCV: 86.6 fL (ref 78.0–100.0)
Platelets: 252 10*3/uL (ref 150–400)
RBC: 2.92 MIL/uL — ABNORMAL LOW (ref 4.22–5.81)
RDW: 16.9 % — ABNORMAL HIGH (ref 11.5–15.5)
WBC: 6 10*3/uL (ref 4.0–10.5)

## 2018-05-15 LAB — RENAL FUNCTION PANEL
Albumin: 2 g/dL — ABNORMAL LOW (ref 3.5–5.0)
Anion gap: 9 (ref 5–15)
BUN: 59 mg/dL — AB (ref 6–20)
CHLORIDE: 114 mmol/L — AB (ref 98–111)
CO2: 18 mmol/L — AB (ref 22–32)
Calcium: 7.7 mg/dL — ABNORMAL LOW (ref 8.9–10.3)
Creatinine, Ser: 7.8 mg/dL — ABNORMAL HIGH (ref 0.61–1.24)
GFR calc Af Amer: 8 mL/min — ABNORMAL LOW (ref >60)
GFR calc non Af Amer: 7 mL/min — ABNORMAL LOW (ref >60)
Glucose, Bld: 110 mg/dL — ABNORMAL HIGH (ref 70–99)
POTASSIUM: 3.7 mmol/L (ref 3.5–5.1)
Phosphorus: 6.8 mg/dL — ABNORMAL HIGH (ref 2.5–4.6)
Sodium: 141 mmol/L (ref 135–145)

## 2018-05-15 LAB — BASIC METABOLIC PANEL
Anion gap: 10 (ref 5–15)
BUN: 60 mg/dL — AB (ref 6–20)
CHLORIDE: 114 mmol/L — AB (ref 98–111)
CO2: 18 mmol/L — AB (ref 22–32)
Calcium: 7.7 mg/dL — ABNORMAL LOW (ref 8.9–10.3)
Creatinine, Ser: 7.85 mg/dL — ABNORMAL HIGH (ref 0.61–1.24)
GFR calc non Af Amer: 7 mL/min — ABNORMAL LOW (ref >60)
GFR, EST AFRICAN AMERICAN: 8 mL/min — AB (ref >60)
Glucose, Bld: 109 mg/dL — ABNORMAL HIGH (ref 70–99)
POTASSIUM: 3.7 mmol/L (ref 3.5–5.1)
SODIUM: 142 mmol/L (ref 135–145)

## 2018-05-15 LAB — MAGNESIUM: MAGNESIUM: 2.1 mg/dL (ref 1.7–2.4)

## 2018-05-15 MED ORDER — ASPIRIN 81 MG PO CHEW
81.0000 mg | CHEWABLE_TABLET | Freq: Every day | ORAL | Status: DC
  Administered 2018-05-15 – 2018-05-26 (×12): 81 mg via ORAL
  Filled 2018-05-15 (×12): qty 1

## 2018-05-15 MED ORDER — CARVEDILOL 25 MG PO TABS
25.0000 mg | ORAL_TABLET | Freq: Two times a day (BID) | ORAL | Status: DC
  Administered 2018-05-15 – 2018-05-24 (×17): 25 mg via ORAL
  Filled 2018-05-15 (×7): qty 1
  Filled 2018-05-15: qty 2
  Filled 2018-05-15 (×12): qty 1

## 2018-05-15 NOTE — Progress Notes (Signed)
[facility] NEPHROLOGY PROGRESS NOTE  Assessment/ Plan: Pt is a 57 y.o. yo male with history of uncontrolled hypertension, HIV, T-cell lymphoma, polysubstance abuse, noncompliant with the medication including HIV medications presented from ID clinic for chest pain, shortness of breath and lower extremity edema.  Consulted for renal failure.  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 Normal renal function in 05/2017. 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  On admission patient had hypertensive urgency with systolic blood pressure more than 200s.  Patient has bilateral echogenic kidneys, anemia,  acidosis points towards underlying CKD. -Benazepril and Bactrim were listed as home medication however patient was not taking any medication at home. -Urinalysis with protein, RBCs and WBCs. Urine PCR 4.29, unable to use ACEI or ARB because of renal failure. Now on HIV medication. Hepatitis B and C serology negative.   -Nonoliguric, urine output of about a liter in 24 hours.  Serum creatinine level elevated to 7.8.  Plan to continue IV Lasix for lower extremity edema management.  He has no uremic symptoms for now.  Continue to monitor urine output and renal function.  If no improvement, plan for dialysis on Monday.  I discussed with the patient and he agrees with the plan.  Palliative care evaluation noted.  #Hypertension: Blood pressure elevated.  Increase the dose of carvedilol to 25 twice daily.  On amlodipine, BiDil and Lasix.  # LE edema DOE: Echo with EF of 45 to 50% with mildly reduced systolic function.  Cardiac stress test with mild to moderate reversibility of septal wall suggesting ischemia.  #Anemia: GI evaluation ongoing because of fecal occult blood test positive.  Iron saturation 44%.    Hemoglobin was  9.  Received dose of Aranesp on 6/25.  #Hypomagnesemia: Improved.   #Nausea vomiting and decreased oral intake: GI evaluation of the patient.  Clinically better today.  Subjective: Seen and examined at bedside.  No chest pain today.  No chest shortness of breath, nausea or vomiting.  Able to lie flat.  Objective Vital signs in last 24 hours: Vitals:   05/14/18 2345 05/15/18 0414 05/15/18 0601 05/15/18 0803  BP: (!) 147/89 (!) 153/88  (!) 162/90  Pulse: 78 79    Resp: 20 (!) 22  16  Temp: 98 F (36.7 C) 97.8 F (36.6 C)  98 F (36.7 C)  TempSrc: Oral Axillary  Oral  SpO2: 97% 100%  100%  Weight:   80.7 kg (178 lb)   Height:       Weight change: -3.26 kg (-7 lb 3 oz)  Intake/Output Summary (Last 24 hours) at 05/15/2018 1001 Last data filed at 05/15/2018 0916 Gross per 24 hour  Intake 714.56 ml  Output 1000 ml  Net -285.44 ml       Labs: Basic Metabolic Panel: Recent Labs  Lab 05/13/18 0241 05/14/18 0253 05/15/18 0222  NA 143  143 142 142  141  K 3.6  3.6 3.4* 3.7  3.7  CL 116*  116* 114* 114*  114*  CO2 16*  17* 17* 18*  18*  GLUCOSE 95  95 93 109*  110*  BUN 65*  66* 62* 60*  59*  CREATININE 7.10*  7.10* 7.36* 7.85*  7.80*  CALCIUM 7.5*  7.6*  7.3* 7.8* 7.7*  7.7*  PHOS 6.4* 6.4* 6.8*   Liver Function Tests: Recent Labs  Lab 05/10/18 1749  05/13/18 0241 05/14/18 0253 05/15/18 0222  AST 26  --  21  --   --   ALT 16*  --  13  --   --   ALKPHOS 36*  --  40  --   --   BILITOT 0.5  --  0.5  --   --   PROT 6.2*  --  5.7*  --   --   ALBUMIN 1.5*   < > 1.8*  1.8* 1.9* 2.0*   < > = values in this interval not displayed.   Recent Labs  Lab 05/13/18 0241  LIPASE 93*   No results for input(s): AMMONIA in the last 168 hours. CBC: Recent Labs  Lab 05/10/18 1424  05/13/18 0241 05/14/18 0253 05/15/18 0222  WBC 4.8  --  6.0 7.4 6.0  HGB 7.2*   < > 8.2* 9.0* 8.2*  HCT 22.9*  --  25.4* 27.9* 25.3*  MCV 84.2  --  86.1 86.4 86.6  PLT 272   --  259 266 252   < > = values in this interval not displayed.   Cardiac Enzymes: Recent Labs  Lab 05/10/18 1906 05/10/18 2225  TROPONINI 0.15* 0.18*   CBG: No results for input(s): GLUCAP in the last 168 hours.  Iron Studies:  No results for input(s): IRON, TIBC, TRANSFERRIN, FERRITIN in the last 72 hours. Studies/Results: Nm Myocar Multi W/spect W/wall Motion / Ef  Result Date: 05/14/2018 CLINICAL DATA:  57 year old with chest pain. Intermediate/high probability. EXAM: MYOCARDIAL IMAGING WITH SPECT (REST AND PHARMACOLOGIC-STRESS) GATED LEFT VENTRICULAR WALL MOTION STUDY LEFT VENTRICULAR EJECTION FRACTION TECHNIQUE: Standard myocardial SPECT imaging was performed after resting intravenous injection of 10 mCi Tc-70m tetrofosmin. Subsequently, intravenous infusion of Lexiscan was performed under the supervision of the Cardiology staff. At peak effect of the drug, 30 mCi Tc-46m tetrofosmin was injected intravenously and standard myocardial SPECT imaging was performed. Quantitative gated imaging was also performed to evaluate left ventricular wall motion, and estimate left ventricular ejection fraction. COMPARISON:  Chest radiograph 05/10/2018 FINDINGS: Perfusion: Mild to moderate reversibility along the septal wall, particularly in the mid segment. Findings are concerning for ischemia in this area. No other areas are concerning for reversibility or ischemia. There is fixed defect along the inferior wall which could be related to an infarct. Wall Motion: Diffuse hypokinesia throughout the left ventricle. Left ventricle is dilated. Left Ventricular Ejection Fraction: 38 % End diastolic volume 696 ml End systolic volume 789 ml IMPRESSION: 1. Mild to moderate reversibility involving the septal wall. Findings are suggestive for ischemia in this area. 2. Fixed defect along the inferior wall may be related to an infarct. 3. Left ventricular ejection fraction is 38% with diffuse hypokinesia in the left  ventricle. 4. Non invasive risk stratification*: Intermediate *2012 Appropriate Use Criteria for Coronary Revascularization Focused Update: J Am Coll Cardiol. 3810;17(5):102-585. http://[email or web address]?articleid=[email or web address] Electronically Signed   By: Markus Daft M.D.   On: 05/14/2018 14:33    Medications: Infusions: . sodium chloride Stopped (05/12/18 1526)    Scheduled Medications: . amLODipine  10 mg Oral Daily  . azithromycin  1,200 mg Oral Weekly  . carvedilol  12.5 mg Oral BID WC  . dapsone  100 mg Oral Daily  . darunavir-cobicistat  1 tablet Oral Q breakfast  . dolutegravir  50 mg Oral Q breakfast  . furosemide  40 mg Intravenous BID  . isosorbide-hydrALAZINE  1 tablet Oral TID  . rilpivirine  25 mg Oral Q breakfast  . senna-docusate  1  tablet Oral Daily  . sodium bicarbonate  1,300 mg Oral BID    have reviewed scheduled and prn medications.  Physical Exam: General: Lying on bed with no distress. Heart: Regular rate rhythm S1-S2 normal, no rubs Lungs: Clear bilateral, no crackles Abdomen:, Nontender, nondistended Extremities: Lower extremity edema 1+   [name] 05/15/2018,10:01 AM  LOS: 5 days

## 2018-05-15 NOTE — Progress Notes (Signed)
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 [name] w/ right-sided chest pain with radiation to the right arm, nausea, and SOB after walking a short distance.  He also reported B LE swelling, paroxysmal nocturnal dyspnea, and bright red blood per rectum for a weeks duration.    Subjective:-Patient was more food, does not like Renal diet    Plan:-  1)HIV/AIDS- - Noncompliant with medication.- -Patient is HIV medication restarted.  CD4 count was 50,   ID on board, Dr Tommy Medal states that  Prezcobix/Tivicay and Doravirine would give him 3 FULLY active drugs without concern for need for food or antacids, -Azithromycin 1200 mg weekly, dapsone weekly, awaiting G6PD, -Prezcobix 800-150 mg daily, -Tivicay 50 mg daily -Edurant 25 mg daily   2)Acute renal failure- - Multifactorial hypertensive nephropathy, HIV nephropathy.   -Discussed case with nephrology Dr. Carolin Sicks , patient may need HD , urine output remains good overall   Dr. Carolin Sicks believes that patient's renal function may improve with resumption of his HIV medication. C/n   Lasix iv 40 mg bid for lower extremity edema, significant concerns about patient's ability to be compliant with hemodialysis should he  be initiated on HD  - Avoid nephrotoxic medication Recent Labs  Lab 05/10/18 1424 05/10/18 1749 05/11/18 1521 05/12/18 0902 05/13/18 0241 05/14/18 0253 05/15/18 0222  CREATININE 5.82* 5.91* 5.92* 6.11* 7.10*  7.10* 7.36* 7.85*  7.80*    3)Acute systolic and diastolic CHF/chest pain.- Strict in and out, -Daily weight, - Transfuse for hemoglobin<8, -Cardiology consult appreciated, Myoview stress test on 05/14/2017 is abnormal, awaiting further cardiology input, , c/n Amlodipine 10 mg daily, BiDil,- Lasix 40 mg IV twice a day, C/n coreg 25 mg twice daily, lasix - add low dose aspirin. Hold off on statin due to HAART and advanced renal disease.   4)Uncontrolled essential HTN- - Multifactorial, but mostly due to noncompliance with medication, -On Norvasc, Coreg, Lasix, BiDil   5)GI bleed/BRBPR- -  Currently patient refuses colonoscopy., -GI input appreciated,   6)Normocytic anemia- -Anemia panel consistent with anemia of chronic disease.  Suspect a combination of AIDS +Renal disease does not appear to have significant GI bleed. ESA/Procrit as per nephrology team  7)Chronic boil of buttocks- -Hx of chronic "boil" of buttock -I&D in ED.  Culture not sent   8)Polysubstance abuse - 6/25 UDS positive marijuana  9)Goal of care -6/26 PALLIATIVE CARE: Noncompliant patient with HIV, acute systolic CHF, acute renal failure possibly going on HD discussed change of status to DNR, discussed short-term vs long-term goals of care  10)CAD--Myoview stress test from 05/14/2018 with reversible ischemia along the septal wall, cardiologist recommends medical  management, concerns about early complications if left heart catheterization is done , also should angioplasty and stenting be done patient would not likely be compliant with dual antiplatelet therapy   DVT prophylaxis: SCD Code Status: Full Family Communication: None Disposition Plan:  Once HIV Meds can be obtained and if ok with  Nephrology  Consultants:  Cardiology ID Nephrology GI Palliative  Procedures/Significant Events:  6/25 echocardiogram:Left ventricle: LVEF =45% to 50%. Diffuse hypokinesis.- Grade 1   diastolic dysfunction. - Left atrium:  moderately dilated.  05/14/2018 abnormal Myoview stress test   Cultures 6/24 MRSA PCR negative  Antimicrobials: Anti-infectives (From admission, onward)   Start     Stop   05/12/18 0730  darunavir-cobicistat (PREZCOBIX) 800-150 MG per tablet 1 tablet         05/12/18 0730  dolutegravir (TIVICAY) tablet 50 mg         05/12/18 0730  rilpivirine (EDURANT) tablet 25 mg         05/12/18 0700  dolutegravir (TIVICAY) tablet 50 mg  Status:  Discontinued     05/12/18 0047   05/12/18 0700  rilpivirine (EDURANT) tablet 25 mg  Status:  Discontinued     05/12/18 0047   05/12/18 0700  darunavir-cobicistat (PREZCOBIX) 800-150 MG per tablet 1 tablet  Status:  Discontinued     05/12/18 0047   05/11/18 1015  azithromycin (ZITHROMAX) tablet 1,200 mg         05/11/18 1015  sulfamethoxazole-trimethoprim (BACTRIM,SEPTRA) 400-80 MG per tablet 1 tablet  Status:  Discontinued     05/11/18 1228   05/11/18 0800  elvitegravir-cobicistat-emtricitabine-tenofovir (GENVOYA) 150-150-200-10 MG tablet 1 tablet  Status:  Discontinued     05/10/18 1931   05/11/18 0800  darunavir (PREZISTA) tablet 800 mg  Status:  Discontinued     05/10/18 1931       Continuous Infusions: . sodium chloride Stopped (05/12/18 1526)     Objective: Vitals:   05/14/18 2345 05/15/18 0414 05/15/18 0601 05/15/18 0803  BP: (!) 147/89 (!) 153/88  (!) 162/90  Pulse: 78 79    Resp: 20 (!) 22  16  Temp: 98 F (36.7 C) 97.8 F (36.6 C)  98 F (36.7 C)  TempSrc: Oral Axillary  Oral  SpO2: 97% 100%  100%  Weight:   80.7 kg (178 lb)   Height:        Intake/Output Summary (Last 24 hours) at 05/15/2018 1120 Last data filed at 05/15/2018 0916 Gross per 24 hour  Intake 714.56 ml  Output 1000 ml  Net  -285.44 ml   Filed Weights   05/13/18 0500 05/14/18 0533 05/15/18 0601  Weight: 84.4 kg (186 lb 1.1 oz) 84 kg (185 lb 3 oz) 80.7 kg (178 lb)    Examination:  Physical Exam  Gen:- Awake Alert, in no apparent distress  HEENT:- [place].AT, No sclera icterus Neck-Supple Neck,No JVD,.  Lungs-   no wheezing, fair air movement CV- S1, S2 normal Abd-  +ve B.Sounds, Abd Soft, No tenderness,    Extremity/Skin:-1-2+ pitting edema,   Skin: Boil on the right ureter buttocks at the crack scab in place Psych-affect is appropriate, oriented x3 Neuro-no new focal deficits, no tremors .  .    CBC: Recent Labs  Lab 05/10/18 1424 05/12/18 1038 05/13/18 0241 05/14/18 0253 05/15/18 0222  WBC 4.8  --  6.0 7.4 6.0  HGB 7.2* 8.9* 8.2* 9.0* 8.2*  HCT 22.9*  --  25.4* 27.9* 25.3*  MCV 84.2  --  86.1 86.4 86.6  PLT 272  --  259 266 790   Basic Metabolic Panel: Recent Labs  Lab 05/11/18 1521 05/12/18 0902 05/13/18 0241 05/14/18 0253 05/15/18 0222  NA 141 140 143  143 142 142  141  K 3.9 3.4* 3.6  3.6 3.4* 3.7  3.7  CL 117* 115* 116*  116* 114* 114*  114*  CO2 13* 15* 16*  17* 17* 18*  18*  GLUCOSE 137* 109* 95  95 93 109*  110*  BUN 71* 66* 65*  66* 62* 60*  59*  CREATININE 5.92* 6.11* 7.10*  7.10* 7.36* 7.85*  7.80*  CALCIUM 7.5* 7.8* 7.5*  7.6*  7.3* 7.8* 7.7*  7.7*  MG  --  1.4* 1.8 1.6* 2.1  PHOS 7.2*  --  6.4* 6.4* 6.8*   GFR: Estimated Creatinine Clearance: 10.4 mL/min (A) (by C-G formula based on SCr of 7.8 mg/dL (H)). Liver Function Tests: Recent Labs  Lab 05/10/18 1749 05/11/18 1521 05/13/18 0241 05/14/18 0253 05/15/18 0222  AST 26  --  21  --   --   ALT 16*  --  13  --   --   ALKPHOS 36*  --  40  --   --   BILITOT 0.5  --  0.5  --   --   PROT 6.2*  --  5.7*  --   --   ALBUMIN 1.5* 1.8* 1.8*  1.8* 1.9* 2.0*   Recent Labs  Lab 05/13/18 0241  LIPASE 93*   No results for input(s): AMMONIA in the last 168 hours. Coagulation Profile: Recent Labs   Lab 05/10/18 1749  INR 1.04   Cardiac Enzymes: Recent Labs  Lab 05/10/18 1906 05/10/18 2225  TROPONINI 0.15* 0.18*   BNP (last 3 results) No results for input(s): PROBNP in the last 8760 hours. HbA1C: No results for input(s): HGBA1C in the last 72 hours. CBG: No results for input(s): GLUCAP in the last 168 hours. Lipid Profile: No results for input(s): CHOL, HDL, LDLCALC, TRIG, CHOLHDL, LDLDIRECT in the last 72 hours. Thyroid Function Tests: No results for input(s): TSH, T4TOTAL, FREET4, T3FREE, THYROIDAB in the last 72 hours. Anemia Panel: No results for input(s): VITAMINB12, FOLATE, FERRITIN, TIBC, IRON, RETICCTPCT in the last 72 hours. Urine analysis:    Component Value Date/Time   COLORURINE YELLOW 05/11/2018 0606   APPEARANCEUR HAZY (A) 05/11/2018 0606   LABSPEC 1.012 05/11/2018 0606   PHURINE 5.0 05/11/2018 0606   GLUCOSEU NEGATIVE 05/11/2018 0606   GLUCOSEU NEG mg/dL 11/23/2006 2036   HGBUR MODERATE (A) 05/11/2018 0606   BILIRUBINUR NEGATIVE 05/11/2018 0606   KETONESUR NEGATIVE 05/11/2018 0606   PROTEINUR >=300 (A) 05/11/2018 0606   UROBILINOGEN 0.2 11/23/2006 2036   NITRITE NEGATIVE 05/11/2018 0606   LEUKOCYTESUR NEGATIVE 05/11/2018 0606   Sepsis Labs: @LABRCNTIP (procalcitonin:4,lacticidven:4)  ) Recent Results (from the past 240 hour(s))  MRSA PCR Screening     Status: None   Collection Time: 05/10/18 11:00 PM  Result Value Ref Range Status   MRSA by PCR NEGATIVE NEGATIVE Final    Comment:        The GeneXpert MRSA Assay (FDA approved for NASAL specimens only), is one component of a comprehensive MRSA colonization surveillance program. It is not intended to diagnose MRSA infection nor to guide or monitor treatment for MRSA infections. Performed at [facility], [place] [address], [place], [place] [address]          Radiology Studies: Nm Myocar Multi W/spect W/wall Motion / Ef  Result Date: 05/14/2018 CLINICAL DATA:  57 year old with  chest pain. Intermediate/high probability. EXAM: MYOCARDIAL IMAGING WITH SPECT (REST AND PHARMACOLOGIC-STRESS) GATED LEFT VENTRICULAR WALL MOTION STUDY LEFT VENTRICULAR EJECTION FRACTION TECHNIQUE: Standard myocardial SPECT imaging was performed after resting intravenous injection of 10 mCi Tc-20m tetrofosmin. Subsequently, intravenous infusion of Lexiscan was performed under the supervision of the Cardiology staff. At peak effect of the drug, 30 mCi Tc-14m tetrofosmin was injected intravenously and standard myocardial SPECT imaging was performed. Quantitative gated imaging was also performed to evaluate left ventricular wall motion, and estimate left ventricular ejection fraction. COMPARISON:  Chest radiograph 05/10/2018 FINDINGS: Perfusion: Mild to moderate reversibility along the septal wall, particularly in the mid segment. Findings are concerning for ischemia in this area. No other areas are concerning for reversibility or ischemia. There is fixed defect along the inferior wall which could be related to an infarct. Wall Motion: Diffuse hypokinesia throughout the left ventricle. Left ventricle is dilated. Left Ventricular Ejection Fraction: 38 % End diastolic volume 366 ml End systolic volume 440 ml IMPRESSION: 1. Mild to moderate reversibility involving the septal wall. Findings are suggestive for ischemia in this area. 2. Fixed defect along the inferior wall may be related to an infarct. 3. Left ventricular ejection fraction is 38% with diffuse hypokinesia in the left ventricle. 4. Non invasive risk stratification*: Intermediate *2012 Appropriate Use Criteria for Coronary Revascularization Focused Update: J Am Coll Cardiol. 3474;25(9):563-875. http://[email or web address] Electronically Signed   By: Markus Daft M.D.   On: 05/14/2018 14:33        Scheduled Meds: . amLODipine  10 mg Oral Daily  . aspirin  81 mg Oral Daily  . azithromycin  1,200 mg Oral Weekly  .  carvedilol  25 mg Oral BID WC  . dapsone  100 mg Oral Daily  . darunavir-cobicistat  1 tablet Oral Q breakfast  . dolutegravir  50 mg Oral Q breakfast  . furosemide  40 mg Intravenous BID  . isosorbide-hydrALAZINE  1 tablet Oral TID  . rilpivirine  25 mg Oral Q breakfast  . senna-docusate  1 tablet Oral Daily  . sodium bicarbonate  1,300 mg Oral BID   Continuous Infusions: . sodium chloride Stopped (05/12/18 1526)     LOS: 5 days     Roxan Hockey, MD [facility]    If 7PM-7AM, please contact night-coverage [email or web address] Password [facility] 05/15/2018, 11:20 AM

## 2018-05-15 NOTE — Progress Notes (Signed)
Subjective: No new complaints, tolerating his medications.   Antibiotics:  Anti-infectives (From admission, onward)   Start     Dose/Rate Route Frequency Ordered Stop   05/13/18 1500  dapsone tablet 100 mg     100 mg Oral Daily 05/13/18 1332     05/12/18 0730  darunavir-cobicistat (PREZCOBIX) 800-150 MG per tablet 1 tablet     1 tablet Oral Daily with breakfast 05/12/18 0047     05/12/18 0730  dolutegravir (TIVICAY) tablet 50 mg     50 mg Oral Daily with breakfast 05/12/18 0047     05/12/18 0730  rilpivirine (EDURANT) tablet 25 mg     25 mg Oral Daily with breakfast 05/12/18 0047     05/12/18 0700  dolutegravir (TIVICAY) tablet 50 mg  Status:  Discontinued     50 mg Oral Daily with breakfast 05/11/18 1521 05/12/18 0047   05/12/18 0700  rilpivirine (EDURANT) tablet 25 mg  Status:  Discontinued     25 mg Oral Daily with breakfast 05/11/18 1521 05/12/18 0047   05/12/18 0700  darunavir-cobicistat (PREZCOBIX) 800-150 MG per tablet 1 tablet  Status:  Discontinued     1 tablet Oral Daily with breakfast 05/11/18 1521 05/12/18 0047   05/11/18 1015  azithromycin (ZITHROMAX) tablet 1,200 mg     1,200 mg Oral Weekly 05/11/18 1007     05/11/18 1015  sulfamethoxazole-trimethoprim (BACTRIM,SEPTRA) 400-80 MG per tablet 1 tablet  Status:  Discontinued     1 tablet Oral Daily 05/11/18 1007 05/11/18 1228   05/11/18 0800  elvitegravir-cobicistat-emtricitabine-tenofovir (GENVOYA) 150-150-200-10 MG tablet 1 tablet  Status:  Discontinued     1 tablet Oral Daily with breakfast 05/10/18 1819 05/10/18 1931   05/11/18 0800  darunavir (PREZISTA) tablet 800 mg  Status:  Discontinued     800 mg Oral Daily with breakfast 05/10/18 1819 05/10/18 1931      Medications: Scheduled Meds: . amLODipine  10 mg Oral Daily  . aspirin  81 mg Oral Daily  . azithromycin  1,200 mg Oral Weekly  . carvedilol  25 mg Oral BID WC  . dapsone  100 mg Oral Daily  . darunavir-cobicistat  1 tablet Oral Q breakfast  .  dolutegravir  50 mg Oral Q breakfast  . furosemide  40 mg Intravenous BID  . isosorbide-hydrALAZINE  1 tablet Oral TID  . rilpivirine  25 mg Oral Q breakfast  . senna-docusate  1 tablet Oral Daily  . sodium bicarbonate  1,300 mg Oral BID   Continuous Infusions: . sodium chloride Stopped (05/12/18 1526)   PRN Meds:.sodium chloride, acetaminophen **OR** [DISCONTINUED] acetaminophen, bisacodyl, HYDROcodone-acetaminophen, labetalol, ondansetron **OR** ondansetron (ZOFRAN) IV, senna-docusate, traMADol    Objective: Weight change: -7 lb 3 oz (-3.26 kg)  Intake/Output Summary (Last 24 hours) at 05/15/2018 1338 Last data filed at 05/15/2018 0916 Gross per 24 hour  Intake 714.56 ml  Output 1000 ml  Net -285.44 ml   Blood pressure (!) 162/90, pulse 79, temperature 98 F (36.7 C), temperature source Oral, resp. rate 16, height _0  (1.651 m), weight 178 lb (80.7 kg), SpO2 100 %. Temp:  [97.7 F (36.5 C)-98.2 F (36.8 C)] 98 F (36.7 C) (06/29 0803) Pulse Rate:  [78-85] 79 (06/29 0414) Resp:  [16-22] 16 (06/29 0803) BP: (147-178)/(88-94) 162/90 (06/29 0803) SpO2:  [97 %-100 %] 100 % (06/29 0803) Weight:  [178 lb (80.7 kg)] 178 lb (80.7 kg) (06/29 0601)  Physical Exam: General: Alert and awake, oriented  x3, not in any acute distress, eating his breakfast HEENT: anicteric sclera, EOMI CVS regular rate, normal  Chest: , no wheezing, no respiratory distress Abdomen: soft non-distended,  Neuro: nonfocal  CBC:    BMET Recent Labs    05/14/18 0253 05/15/18 0222  NA 142 142  141  K 3.4* 3.7  3.7  CL 114* 114*  114*  CO2 17* 18*  18*  GLUCOSE 93 109*  110*  BUN 62* 60*  59*  CREATININE 7.36* 7.85*  7.80*  CALCIUM 7.8* 7.7*  7.7*     Liver Panel  Recent Labs    05/13/18 0241 05/14/18 0253 05/15/18 0222  PROT 5.7*  --   --   ALBUMIN 1.8*  1.8* 1.9* 2.0*  AST 21  --   --   ALT 13  --   --   ALKPHOS 40  --   --   BILITOT 0.5  --   --         Sedimentation Rate No results for input(s): ESRSEDRATE in the last 72 hours. C-Reactive Protein No results for input(s): CRP in the last 72 hours.  Micro Results: Recent Results (from the past 720 hour(s))  MRSA PCR Screening     Status: None   Collection Time: 05/10/18 11:00 PM  Result Value Ref Range Status   MRSA by PCR NEGATIVE NEGATIVE Final    Comment:        The GeneXpert MRSA Assay (FDA approved for NASAL specimens only), is one component of a comprehensive MRSA colonization surveillance program. It is not intended to diagnose MRSA infection nor to guide or monitor treatment for MRSA infections. Performed at [facility], [place] [address], [place], [place] [address]     Studies/Results: Nm Myocar Multi W/spect Tamela Oddi Motion / Ef  Result Date: 05/14/2018 CLINICAL DATA:  57 year old with chest pain. Intermediate/high probability. EXAM: MYOCARDIAL IMAGING WITH SPECT (REST AND PHARMACOLOGIC-STRESS) GATED LEFT VENTRICULAR WALL MOTION STUDY LEFT VENTRICULAR EJECTION FRACTION TECHNIQUE: Standard myocardial SPECT imaging was performed after resting intravenous injection of 10 mCi Tc-61mtetrofosmin. Subsequently, intravenous infusion of Lexiscan was performed under the supervision of the Cardiology staff. At peak effect of the drug, 30 mCi Tc-943metrofosmin was injected intravenously and standard myocardial SPECT imaging was performed. Quantitative gated imaging was also performed to evaluate left ventricular wall motion, and estimate left ventricular ejection fraction. COMPARISON:  Chest radiograph 05/10/2018 FINDINGS: Perfusion: Mild to moderate reversibility along the septal wall, particularly in the mid segment. Findings are concerning for ischemia in this area. No other areas are concerning for reversibility or ischemia. There is fixed defect along the inferior wall which could be related to an infarct. Wall Motion: Diffuse hypokinesia throughout the left ventricle.  Left ventricle is dilated. Left Ventricular Ejection Fraction: 38 % End diastolic volume 17426l End systolic volume 10834l IMPRESSION: 1. Mild to moderate reversibility involving the septal wall. Findings are suggestive for ischemia in this area. 2. Fixed defect along the inferior wall may be related to an infarct. 3. Left ventricular ejection fraction is 38% with diffuse hypokinesia in the left ventricle. 4. Non invasive risk stratification*: Intermediate *2012 Appropriate Use Criteria for Coronary Revascularization Focused Update: J Am Coll Cardiol. 201962;22(9):[email or web address] Electronically Signed   By: AdMarkus Daft.D.   On: 05/14/2018 14:33      Assessment/Plan:  INTERVAL HISTORY:tolerating antiretrovirals   Active Problems:   Human immunodeficiency virus (HIV) disease (HCPurdy  Malignant lymphomas of lymph nodes of  head, face, and neck ([place])   Hypertension   Chest pain   Tobacco use   Furuncle of gluteal region   CHF (congestive heart failure) (H[facility])   Hypertensive urgency   GIB (gastrointestinal bleeding)   AKI (acute kidney injury) ([facility])   Heme + stool   Normocytic anemia   Pain of both hip joints   Goals of care, counseling/discussion   Palliative care by specialist    Paula Libra is a 57 y.o. male with a 25 year old man with HIV and AIDS with poor adherence to antiretroviral therapy prior his lymphoma that was treated with chronic kidney disease that is worsened with acute on chronic renal failure.  He is currently on the inpatient service with hopes that he may have renal recovery.  He has been changed to a more renal based regimen of TIVICAY PREZCOBIX and RPV. HLA B701 pending  #1 HIV AIDS: for adhererence sake the best regimen for him would be SYMTUZA that is FDA indicated for individuals who have S to TNF and DRV. While TAF thought to be [facility] safer than TDF we dont want to any chances in current setting.  Certainly I  think PREZCOBIX and TIVICAY make a lot of sense for him.  I worry very much about real RILPVIRINE  being helpful drug in this patient  It does REQUIRE MEALS with it and it requires patient to not take ANTACIDS whether the ones they can buy at the store or ones foisted upon them by providers  I think for outpatient world Prezcobix/Tivicay and Doravirine would give him 3 FULLY active drugs without concern for need for food or antacids  I dont think we have latter available in house  He appears to have insurance and if so we should get him meds via [facility] and deliver regimen to the bedside for him early next week that he can leave the hospital on      LOS: 5 days   Alcide Evener 05/15/2018, 1:38 PM

## 2018-05-15 NOTE — Progress Notes (Signed)
DAILY PROGRESS NOTE   Patient Name: GARO HEIDELBERG Date of Encounter: 05/15/2018  Chief Complaint   No complaints  Patient Profile   ROMERO LETIZIA is a 57 y.o. male with a hx of uncontrolled hypertension, HIV disease and noncompliance with antiviral therapy, T-cell lymphoma treated with radiation therapy, remote history of polysubstance abuse, tobacco and alcohol abuse, hip AVN s/p hip replacement who is being seen today for the evaluation of chest pain and shortness of breath at the request of Dr. Thereasa Solo.  Subjective   Myoview yesterday shows reversible ischemia of the septum with LVEF 38% and global hypokinesis - LVEF 45-50% on echo.    Objective   Vitals:   05/14/18 2345 05/15/18 0414 05/15/18 0601 05/15/18 0803  BP: (!) 147/89 (!) 153/88  (!) 162/90  Pulse: 78 79    Resp: 20 (!) 22  16  Temp: 98 F (36.7 C) 97.8 F (36.6 C)  98 F (36.7 C)  TempSrc: Oral Axillary  Oral  SpO2: 97% 100%  100%  Weight:   178 lb (80.7 kg)   Height:        Intake/Output Summary (Last 24 hours) at 05/15/2018 1110 Last data filed at 05/15/2018 8757 Gross per 24 hour  Intake 714.56 ml  Output 1000 ml  Net -285.44 ml   Filed Weights   05/13/18 0500 05/14/18 0533 05/15/18 0601  Weight: 186 lb 1.1 oz (84.4 kg) 185 lb 3 oz (84 kg) 178 lb (80.7 kg)    Physical Exam   General appearance: alert and no distress Neck: no carotid bruit, no JVD and thyroid not enlarged, symmetric, no tenderness/mass/nodules Lungs: diminished breath sounds bilaterally Heart: regular rate and rhythm Abdomen: soft, non-tender; bowel sounds normal; no masses,  no organomegaly Extremities: extremities normal, atraumatic, no cyanosis or edema Pulses: 2+ and symmetric Skin: Skin color, texture, turgor normal. No rashes or lesions Neurologic: Grossly normal Psych: Pleasant   Inpatient Medications    Scheduled Meds: . amLODipine  10 mg Oral Daily  . azithromycin  1,200 mg Oral Weekly  . carvedilol  25 mg  Oral BID WC  . dapsone  100 mg Oral Daily  . darunavir-cobicistat  1 tablet Oral Q breakfast  . dolutegravir  50 mg Oral Q breakfast  . furosemide  40 mg Intravenous BID  . isosorbide-hydrALAZINE  1 tablet Oral TID  . rilpivirine  25 mg Oral Q breakfast  . senna-docusate  1 tablet Oral Daily  . sodium bicarbonate  1,300 mg Oral BID    Continuous Infusions: . sodium chloride Stopped (05/12/18 1526)    PRN Meds: sodium chloride, acetaminophen **OR** [DISCONTINUED] acetaminophen, bisacodyl, HYDROcodone-acetaminophen, labetalol, ondansetron **OR** ondansetron (ZOFRAN) IV, senna-docusate, traMADol   Labs   Results for orders placed or performed during the hospital encounter of 05/10/18 (from the past 48 hour(s))  Renal function panel     Status: Abnormal   Collection Time: 05/14/18  2:53 AM  Result Value Ref Range   Sodium 142 135 - 145 mmol/L   Potassium 3.4 (L) 3.5 - 5.1 mmol/L   Chloride 114 (H) 98 - 111 mmol/L    Comment: Please note change in reference range.   CO2 17 (L) 22 - 32 mmol/L   Glucose, Bld 93 70 - 99 mg/dL    Comment: Please note change in reference range.   BUN 62 (H) 6 - 20 mg/dL    Comment: Please note change in reference range.   Creatinine, Ser 7.36 (H) 0.61 -  1.24 mg/dL   Calcium 7.8 (L) 8.9 - 10.3 mg/dL   Phosphorus 6.4 (H) 2.5 - 4.6 mg/dL   Albumin 1.9 (L) 3.5 - 5.0 g/dL   GFR calc non Af Amer 7 (L) >60 mL/min   GFR calc Af Amer 9 (L) >60 mL/min    Comment: (NOTE) The eGFR has been calculated using the CKD EPI equation. This calculation has not been validated in all clinical situations. eGFR's persistently <60 mL/min signify possible Chronic Kidney Disease.    Anion gap 11 5 - 15    Comment: Performed at [facility] [address], [place], [place] [address]  CBC     Status: Abnormal   Collection Time: 05/14/18  2:53 AM  Result Value Ref Range   WBC 7.4 4.0 - 10.5 K/uL   RBC 3.23 (L) 4.22 - 5.81 MIL/uL   Hemoglobin 9.0 (L) 13.0 - 17.0  g/dL   HCT 27.9 (L) 39.0 - 52.0 %   MCV 86.4 78.0 - 100.0 fL   MCH 27.9 26.0 - 34.0 pg   MCHC 32.3 30.0 - 36.0 g/dL   RDW 17.2 (H) 11.5 - 15.5 %   Platelets 266 150 - 400 K/uL    Comment: Performed at [facility], [place] [address] [place], [place] [address]  Magnesium     Status: Abnormal   Collection Time: 05/14/18  2:53 AM  Result Value Ref Range   Magnesium 1.6 (L) 1.7 - 2.4 mg/dL    Comment: Performed at [facility] [address] [place], [place] [address]  Renal function panel     Status: Abnormal   Collection Time: 05/15/18  2:22 AM  Result Value Ref Range   Sodium 141 135 - 145 mmol/L   Potassium 3.7 3.5 - 5.1 mmol/L   Chloride 114 (H) 98 - 111 mmol/L    Comment: Please note change in reference range.   CO2 18 (L) 22 - 32 mmol/L   Glucose, Bld 110 (H) 70 - 99 mg/dL    Comment: Please note change in reference range.   BUN 59 (H) 6 - 20 mg/dL    Comment: Please note change in reference range.   Creatinine, Ser 7.80 (H) 0.61 - 1.24 mg/dL   Calcium 7.7 (L) 8.9 - 10.3 mg/dL   Phosphorus 6.8 (H) 2.5 - 4.6 mg/dL   Albumin 2.0 (L) 3.5 - 5.0 g/dL   GFR calc non Af Amer 7 (L) >60 mL/min   GFR calc Af Amer 8 (L) >60 mL/min    Comment: (NOTE) The eGFR has been calculated using the CKD EPI equation. This calculation has not been validated in all clinical situations. eGFR's persistently <60 mL/min signify possible Chronic Kidney Disease.    Anion gap 9 5 - 15    Comment: Performed at [facility] [address], [place], [place] [address]  Basic metabolic panel     Status: Abnormal   Collection Time: 05/15/18  2:22 AM  Result Value Ref Range   Sodium 142 135 - 145 mmol/L   Potassium 3.7 3.5 - 5.1 mmol/L   Chloride 114 (H) 98 - 111 mmol/L    Comment: Please note change in reference range.   CO2 18 (L) 22 - 32 mmol/L   Glucose, Bld 109 (H) 70 - 99 mg/dL    Comment: Please note change in reference range.   BUN 60 (H) 6 - 20 mg/dL    Comment: Please note  change  in reference range.   Creatinine, Ser 7.85 (H) 0.61 - 1.24 mg/dL   Calcium 7.7 (L) 8.9 - 10.3 mg/dL   GFR calc non Af Amer 7 (L) >60 mL/min   GFR calc Af Amer 8 (L) >60 mL/min    Comment: (NOTE) The eGFR has been calculated using the CKD EPI equation. This calculation has not been validated in all clinical situations. eGFR's persistently <60 mL/min signify possible Chronic Kidney Disease.    Anion gap 10 5 - 15    Comment: Performed at [facility] [address] [place], [place] [address]  Magnesium     Status: None   Collection Time: 05/15/18  2:22 AM  Result Value Ref Range   Magnesium 2.1 1.7 - 2.4 mg/dL    Comment: Performed at [facility] [address], [place], [place] [address]  CBC     Status: Abnormal   Collection Time: 05/15/18  2:22 AM  Result Value Ref Range   WBC 6.0 4.0 - 10.5 K/uL   RBC 2.92 (L) 4.22 - 5.81 MIL/uL   Hemoglobin 8.2 (L) 13.0 - 17.0 g/dL   HCT 25.3 (L) 39.0 - 52.0 %   MCV 86.6 78.0 - 100.0 fL   MCH 28.1 26.0 - 34.0 pg   MCHC 32.4 30.0 - 36.0 g/dL   RDW 16.9 (H) 11.5 - 15.5 %   Platelets 252 150 - 400 K/uL    Comment: Performed at [facility], [place] [address], [place], [place] [address]    ECG   N/A  Telemetry   Sinus rhythm - Personally Reviewed  Radiology    Nm Myocar Multi W/spect W/wall Motion / Ef  Result Date: 05/14/2018 CLINICAL DATA:  57 year old with chest pain. Intermediate/high probability. EXAM: MYOCARDIAL IMAGING WITH SPECT (REST AND PHARMACOLOGIC-STRESS) GATED LEFT VENTRICULAR WALL MOTION STUDY LEFT VENTRICULAR EJECTION FRACTION TECHNIQUE: Standard myocardial SPECT imaging was performed after resting intravenous injection of 10 mCi Tc-5mtetrofosmin. Subsequently, intravenous infusion of Lexiscan was performed under the supervision of the Cardiology staff. At peak effect of the drug, 30 mCi Tc-928metrofosmin was injected intravenously and standard myocardial SPECT imaging was performed.  Quantitative gated imaging was also performed to evaluate left ventricular wall motion, and estimate left ventricular ejection fraction. COMPARISON:  Chest radiograph 05/10/2018 FINDINGS: Perfusion: Mild to moderate reversibility along the septal wall, particularly in the mid segment. Findings are concerning for ischemia in this area. No other areas are concerning for reversibility or ischemia. There is fixed defect along the inferior wall which could be related to an infarct. Wall Motion: Diffuse hypokinesia throughout the left ventricle. Left ventricle is dilated. Left Ventricular Ejection Fraction: 38 % End diastolic volume 17654l End systolic volume 10650l IMPRESSION: 1. Mild to moderate reversibility involving the septal wall. Findings are suggestive for ischemia in this area. 2. Fixed defect along the inferior wall may be related to an infarct. 3. Left ventricular ejection fraction is 38% with diffuse hypokinesia in the left ventricle. 4. Non invasive risk stratification*: Intermediate *2012 Appropriate Use Criteria for Coronary Revascularization Focused Update: J Am Coll Cardiol. 203546;56(8):127-517http://[email or web address]?articleid=[email or web address] Electronically Signed   By: AdMarkus Daft.D.   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 Discussion about possible dialysis on Monday - Dr. Carolynne Edouard suggests  the renal function may improve with HIV treatment. 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 At this point, cardiac cath would certainly lead to dialysis and may cause irreversible renal failure. I would favor medical therapy as his chest pain has resolved. Palliative care has also been consulted. On coreg, lasix - add low dose aspirin. Hold off on statin due to HAART and advanced renal disease.  Time Spent Directly with Patient:  I have spent a total of 25 minutes with the patient reviewing hospital notes, telemetry, EKGs, labs and examining the patient as well as establishing an assessment and plan that was discussed personally with the patient. > 50% of time was spent in direct patient care.  Length of Stay:  LOS: 5 days   Pixie Casino, MD, [facility], [place] Director of the Advanced Lipid Disorders &  Cardiovascular Risk Reduction Clinic Diplomate of the American Board of Clinical Lipidology Attending Cardiologist  Direct Dial: [phone]  Fax: [phone]  Website:  [email or web address] 05/15/2018, 11:09 AM

## 2018-05-16 DIAGNOSIS — N171 Acute kidney failure with acute cortical necrosis: Secondary | ICD-10-CM

## 2018-05-16 LAB — CBC
HEMATOCRIT: 25.6 % — AB (ref 39.0–52.0)
HEMOGLOBIN: 8.2 g/dL — AB (ref 13.0–17.0)
MCH: 27.6 pg (ref 26.0–34.0)
MCHC: 32 g/dL (ref 30.0–36.0)
MCV: 86.2 fL (ref 78.0–100.0)
Platelets: 268 10*3/uL (ref 150–400)
RBC: 2.97 MIL/uL — AB (ref 4.22–5.81)
RDW: 17.2 % — ABNORMAL HIGH (ref 11.5–15.5)
WBC: 7.4 10*3/uL (ref 4.0–10.5)

## 2018-05-16 LAB — RENAL FUNCTION PANEL
ALBUMIN: 1.9 g/dL — AB (ref 3.5–5.0)
ANION GAP: 9 (ref 5–15)
BUN: 56 mg/dL — ABNORMAL HIGH (ref 6–20)
CO2: 19 mmol/L — ABNORMAL LOW (ref 22–32)
Calcium: 7.8 mg/dL — ABNORMAL LOW (ref 8.9–10.3)
Chloride: 113 mmol/L — ABNORMAL HIGH (ref 98–111)
Creatinine, Ser: 8.04 mg/dL — ABNORMAL HIGH (ref 0.61–1.24)
GFR calc non Af Amer: 7 mL/min — ABNORMAL LOW (ref >60)
GFR, EST AFRICAN AMERICAN: 8 mL/min — AB (ref >60)
GLUCOSE: 107 mg/dL — AB (ref 70–99)
PHOSPHORUS: 6.5 mg/dL — AB (ref 2.5–4.6)
POTASSIUM: 3.6 mmol/L (ref 3.5–5.1)
Sodium: 141 mmol/L (ref 135–145)

## 2018-05-16 LAB — MAGNESIUM: Magnesium: 1.9 mg/dL (ref 1.7–2.4)

## 2018-05-16 MED ORDER — CHLORHEXIDINE GLUCONATE CLOTH 2 % EX PADS
6.0000 | MEDICATED_PAD | Freq: Every day | CUTANEOUS | Status: DC
  Administered 2018-05-17 – 2018-05-24 (×6): 6 via TOPICAL

## 2018-05-16 MED ORDER — VANCOMYCIN HCL IN DEXTROSE 1-5 GM/200ML-% IV SOLN
1000.0000 mg | INTRAVENOUS | Status: DC
  Filled 2018-05-16: qty 200

## 2018-05-16 MED ORDER — VANCOMYCIN HCL IN DEXTROSE 1-5 GM/200ML-% IV SOLN
1000.0000 mg | INTRAVENOUS | Status: AC
  Administered 2018-05-17: 1000 mg via INTRAVENOUS
  Filled 2018-05-16: qty 200

## 2018-05-16 MED ORDER — DORAVIRINE 100 MG PO TABS: 100.0000 mg | ORAL_TABLET | Freq: Every day | ORAL | Status: DC

## 2018-05-16 MED ORDER — RILPIVIRINE HCL 25 MG PO TABS
25.0000 mg | ORAL_TABLET | Freq: Every day | ORAL | Status: DC
Start: 2018-05-17 — End: 2018-05-24
  Administered 2018-05-17 – 2018-05-24 (×8): 25 mg via ORAL
  Filled 2018-05-16 (×9): qty 1

## 2018-05-16 MED ORDER — CALCIUM ACETATE (PHOS BINDER) 667 MG PO CAPS
667.0000 mg | ORAL_CAPSULE | Freq: Three times a day (TID) | ORAL | Status: DC
  Administered 2018-05-16 – 2018-05-23 (×16): 667 mg via ORAL
  Filled 2018-05-16 (×16): qty 1

## 2018-05-16 NOTE — Progress Notes (Signed)
HIV Genotype Composite Data Genotype Dates:   Mutations in Bold impact drug susceptibility RT Mutations M184V, T215DE  PI Mutations   Integrase Mutations    Interpretation of Genotype Data per [employer] HIV Database Nucleoside RTIs  abacavir (ABC) Low-Level Resistance zidovudine (AZT) Potential Low-Level Resistance emtricitabine (FTC) High-Level Resistance lamivudine (3TC) High-Level Resistance tenofovir (TDF) Susceptible   Non-Nucleoside RTIs  doravirine (DOR) Susceptible efavirenz (EFV) Susceptible etravirine (ETR) Susceptible nevirapine (NVP) Susceptible rilpivirine (RPV) Susceptible   Protease Inhibitors     Integrase Inhibitors

## 2018-05-16 NOTE — Progress Notes (Signed)
    At some point a GI work-up is reasonable but many other issues right now and he has refused colonoscopy.  Will not continue to follow for now  Call back prn  He can be referred as outpatient back once he gets through other acute issues or if possible GI problems become a more pressing concern.  Gatha Mayer, MD, [facility] 05/16/2018 3:05 PM [phone]

## 2018-05-16 NOTE — Progress Notes (Signed)
PROGRESS NOTE    [name]  DZH:[id number] DOB: [birth year] DOA: 05/10/2018 PCP: [name] Riches, MD   Active Problems:   Human immunodeficiency virus (HIV) disease ([place])   Malignant lymphomas of lymph nodes of head, face, and neck ([place])   Hypertension   Chest pain   Tobacco use   Furuncle of gluteal region   CHF (congestive heart failure) (HCC)   Hypertensive urgency   GIB (gastrointestinal bleeding)   AKI (acute kidney injury) ([place])   Heme + stool   Normocytic anemia   Pain of both hip joints   Goals of care, counseling/discussion   Palliative care by specialist  Brief Narrative:  [name] PMHx  HIV with CD4 count of 13, 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  He also reported B LE swelling, paroxysmal nocturnal dyspnea, and bright red blood per rectum for a weeks duration.    Subjective:-Patient is okay with undergoing hemodialysis, voiding well    Plan:-  1)HIV/AIDS- - Noncompliant with medication.- - HIV medication restarted,   CD4 count was 50,   ID on board, [name] states that  Prezcobix/Tivicay and Doravirine would give him 3 FULLY active drugs without concern for need for food or antacids, changed to a more renal based regimen of TIVICAY PREZCOBIX and RPV. HLA B701 pending -Azithromycin 1200 mg weekly, dapsone weekly, awaiting G6PD, -Prezcobix 800-150 mg daily, -Tivicay 50 mg dailym, -Edurant 25 mg daily  Infectious disease service is signing off as of 05/16/2018  2)Acute renal failure- - creatinine is now up to 8, multifactorial hypertensive nephropathy, HIV nephropathy.  -Discussed case with nephrology [name] , plan is to place tunneled HD catheter on 05/17/2018 and initiate hemodialysis, patient is okay with this, C/n    Lasix iv 40 mg bid for lower extremity edema, significant concerns about patient's ability to be compliant with hemodialysis should he be initiated on HD  - Avoid nephrotoxic medication Recent Labs  Lab 05/10/18 1749 05/11/18 1521 05/12/18 0902 05/13/18 0241 05/14/18 0253 05/15/18 0222 05/16/18 0236  CREATININE 5.91* 5.92* 6.11* 7.10*  7.10* 7.36* 7.85*  7.80* 8.04*    3)Acute systolic and diastolic CHF/chest pain.- Strict in and out, -Daily weight, - Transfuse for hemoglobin<8, -Cardiology consult appreciated, Myoview stress test on 05/14/2017 is abnormal, awaiting further cardiology input, , c/n Amlodipine 10 mg daily, BiDil,- Lasix 40 mg IV twice a day, C/n coreg 25 mg twice daily, lasix - add low dose aspirin. Hold off on statin due to HAART and advanced renal disease.  4)Uncontrolled essential HTN- - Multifactorial, but mostly due to noncompliance with medication, -On Norvasc, Coreg, Lasix, BiDil   5)GI bleed/BRBPR- -  Currently patient refuses colonoscopy., -GI input appreciated,  GI signed off as of 05/16/2018  6)Normocytic anemia- -Anemia panel consistent with anemia of chronic disease.  Suspect a combination of AIDS +Renal disease does not appear to have significant GI bleed. ESA/Procrit as per nephrology team  7)Chronic boil of buttocks- -Hx of chronic "boil" of buttock, -I&D in ED.  Culture not sent   8)Polysubstance abuse - 6/25 UDS positive marijuana  9)Goal of care -6/26 PALLIATIVE CARE: Noncompliant patient with HIV, acute systolic CHF, acute renal failure possibly going on HD discussed change of status to  DNR, discussed short-term vs long-term goals of care  10)CAD--Myoview stress test from 05/14/2018 with reversible ischemia along the septal wall, cardiologist recommends medical management, concerns about early complications if left heart catheterization is done , also should angioplasty and stenting be done patient would not likely be compliant with dual antiplatelet  therapy   DVT prophylaxis: SCD Code Status: Full Family Communication: None Disposition Plan:  Once HIV Meds can be obtained and if ok with Nephrology  Consultants:  Cardiology ID Nephrology GI Palliative  Procedures/Significant Events:  6/25 echocardiogram:Left ventricle: LVEF =45% to 50%. Diffuse hypokinesis.- Grade 1   diastolic dysfunction. - Left atrium:  moderately dilated.  05/14/2018 abnormal Myoview stress test   Cultures 6/24 MRSA PCR negative  Antimicrobials: Anti-infectives (From admission, onward)   Start     Stop   05/12/18 0730  darunavir-cobicistat (PREZCOBIX) 800-150 MG per tablet 1 tablet         05/12/18 0730  dolutegravir (TIVICAY) tablet 50 mg         05/12/18 0730  rilpivirine (EDURANT) tablet 25 mg         05/12/18 0700  dolutegravir (TIVICAY) tablet 50 mg  Status:  Discontinued     05/12/18 0047   05/12/18 0700  rilpivirine (EDURANT) tablet 25 mg  Status:  Discontinued     05/12/18 0047   05/12/18 0700  darunavir-cobicistat (PREZCOBIX) 800-150 MG per tablet 1 tablet  Status:  Discontinued     05/12/18 0047   05/11/18 1015  azithromycin (ZITHROMAX) tablet 1,200 mg         05/11/18 1015  sulfamethoxazole-trimethoprim (BACTRIM,SEPTRA) 400-80 MG per tablet 1 tablet  Status:  Discontinued     05/11/18 1228   05/11/18 0800  elvitegravir-cobicistat-emtricitabine-tenofovir (GENVOYA) 150-150-200-10 MG tablet 1 tablet  Status:  Discontinued     05/10/18 1931   05/11/18 0800  darunavir (PREZISTA) tablet 800 mg  Status:  Discontinued     05/10/18 1931       Continuous Infusions: . sodium chloride Stopped (05/12/18 1526)  . [START ON 05/17/2018] vancomycin       Objective: Vitals:   05/15/18 2239 05/16/18 0845 05/16/18 1553 05/16/18 1736  BP: (!) 157/87 138/77  (!) 166/98  Pulse: 76 77  78  Resp: '16 20 17   ' Temp: 98.2 F (36.8 C) 98.2 F (36.8 C)  97.9 F (36.6 C)  TempSrc: Oral Oral  Oral  SpO2: 100% 97%  98%  Weight:      Height:          Intake/Output Summary (Last 24 hours) at 05/16/2018 1743 Last data filed at 05/16/2018 1556 Gross per 24 hour  Intake 840 ml  Output 1150 ml  Net -310 ml   Filed Weights   05/13/18 0500 05/14/18 0533 05/15/18 0601  Weight: 84.4 kg (186 lb 1.1 oz) 84 kg (185 lb 3 oz) 80.7 kg (178 lb)    Examination:  Physical Exam  Gen:- Awake Alert, in no apparent distress  HEENT:- [place].AT, No sclera icterus Neck-Supple Neck,No JVD,.  Lungs-   no wheezing, fair air movement CV- S1, S2 normal Abd-  +ve B.Sounds, Abd Soft, No tenderness,    Extremity/Skin:-1-2+ pitting edema,   Skin: Boil on the right ureter buttocks at the crack scab in place Psych-affect is appropriate, oriented x3 Neuro-no new focal deficits, no tremors .  .    CBC: Recent Labs  Lab 05/10/18 1424 05/12/18 1038 05/13/18 0241 05/14/18 0253 05/15/18 0222 05/16/18 0236  WBC 4.8  --  6.0 7.4 6.0 7.4  HGB 7.2* 8.9* 8.2* 9.0* 8.2* 8.2*  HCT 22.9*  --  25.4* 27.9* 25.3* 25.6*  MCV 84.2  --  86.1 86.4 86.6 86.2  PLT 272  --  259 266 252 259   Basic Metabolic Panel: Recent Labs  Lab 05/11/18 1521 05/12/18 0902 05/13/18 0241 05/14/18 0253 05/15/18 0222 05/16/18 0236  NA 141 140 143  143 142 142  141 141  K 3.9 3.4* 3.6  3.6 3.4* 3.7  3.7 3.6  CL 117* 115* 116*  116* 114* 114*  114* 113*  CO2 13* 15* 16*  17* 17* 18*  18* 19*  GLUCOSE 137* 109* 95  95 93 109*  110* 107*  BUN 71* 66* 65*  66* 62* 60*  59* 56*  CREATININE 5.92* 6.11* 7.10*  7.10* 7.36* 7.85*  7.80* 8.04*  CALCIUM 7.5* 7.8* 7.5*  7.6*  7.3* 7.8* 7.7*  7.7* 7.8*  MG  --  1.4* 1.8 1.6* 2.1 1.9  PHOS 7.2*  --  6.4* 6.4* 6.8* 6.5*   GFR: Estimated Creatinine Clearance: 10 mL/min (A) (by C-G formula based on SCr of 8.04 mg/dL (H)). Liver Function Tests: Recent Labs  Lab 05/10/18 1749 05/11/18 1521 05/13/18 0241 05/14/18 0253 05/15/18 0222 05/16/18 0236  AST 26  --  21  --   --   --   ALT 16*  --  13  --   --   --   ALKPHOS  36*  --  40  --   --   --   BILITOT 0.5  --  0.5  --   --   --   PROT 6.2*  --  5.7*  --   --   --   ALBUMIN 1.5* 1.8* 1.8*  1.8* 1.9* 2.0* 1.9*   Recent Labs  Lab 05/13/18 0241  LIPASE 93*   No results for input(s): AMMONIA in the last 168 hours. Coagulation Profile: Recent Labs  Lab 05/10/18 1749  INR 1.04   Cardiac Enzymes: Recent Labs  Lab 05/10/18 1906 05/10/18 2225  TROPONINI 0.15* 0.18*   BNP (last 3 results) No results for input(s): PROBNP in the last 8760 hours. HbA1C: No results for input(s): HGBA1C in the last 72 hours. CBG: No results for input(s): GLUCAP in the last 168 hours. Lipid Profile: No results for input(s): CHOL, HDL, LDLCALC, TRIG, CHOLHDL, LDLDIRECT in the last 72 hours. Thyroid Function Tests: No results for input(s): TSH, T4TOTAL, FREET4, T3FREE, THYROIDAB in the last 72 hours. Anemia Panel: No results for input(s): VITAMINB12, FOLATE, FERRITIN, TIBC, IRON, RETICCTPCT in the last 72 hours. Urine analysis:    Component Value Date/Time   COLORURINE YELLOW 05/11/2018 0606   APPEARANCEUR HAZY (A) 05/11/2018 0606   LABSPEC 1.012 05/11/2018 0606   PHURINE 5.0 05/11/2018 0606   GLUCOSEU NEGATIVE 05/11/2018 0606   GLUCOSEU NEG mg/dL 11/23/2006 2036   HGBUR MODERATE (A) 05/11/2018 0606   BILIRUBINUR NEGATIVE 05/11/2018 0606   KETONESUR NEGATIVE 05/11/2018 0606   PROTEINUR >=300 (A) 05/11/2018 0606   UROBILINOGEN 0.2 11/23/2006 2036   NITRITE NEGATIVE 05/11/2018 0606   LEUKOCYTESUR NEGATIVE 05/11/2018 0606   Sepsis Labs: '@LABRCNTIP' (procalcitonin:4,lacticidven:4)  ) Recent Results (from the past 240 hour(s))  MRSA PCR Screening     Status: None   Collection Time: 05/10/18 11:00 PM  Result Value Ref Range Status   MRSA by PCR NEGATIVE NEGATIVE Final    Comment:        The GeneXpert MRSA Assay (FDA  approved for NASAL specimens only), is one component of a comprehensive MRSA colonization surveillance program. It is not intended to  diagnose MRSA infection nor to guide or monitor treatment for MRSA infections. Performed at [facility], [place] [address], [place], [place] [address]          Radiology Studies: No results found.      Scheduled Meds: . amLODipine  10 mg Oral Daily  . aspirin  81 mg Oral Daily  . azithromycin  1,200 mg Oral Weekly  . calcium acetate  667 mg Oral TID WC  . carvedilol  25 mg Oral BID WC  . Chlorhexidine Gluconate Cloth  6 each Topical Q0600  . dapsone  100 mg Oral Daily  . darunavir-cobicistat  1 tablet Oral Q breakfast  . dolutegravir  50 mg Oral Q breakfast  . furosemide  40 mg Intravenous BID  . isosorbide-hydrALAZINE  1 tablet Oral TID  . [START ON 05/17/2018] rilpivirine  25 mg Oral Q breakfast  . senna-docusate  1 tablet Oral Daily  . sodium bicarbonate  1,300 mg Oral BID   Continuous Infusions: . sodium chloride Stopped (05/12/18 1526)  . [START ON 05/17/2018] vancomycin       LOS: 6 days    Roxan Hockey, MD [facility]    If 7PM-7AM, please contact night-coverage [email or web address] Password [facility] 05/16/2018, 5:43 PM

## 2018-05-16 NOTE — Consult Note (Signed)
Chief Complaint: Patient was seen in consultation today for renal failure  Referring Physician(s): Dr. Carolin Sicks  Supervising Physician: Arne Cleveland  Patient Status: [name] - In-pt  History of Present Illness: TREVAUGHN SCHEAR is a 57 y.o. male with past medical history of uncontrolled HTN, HIV, lymphoma, polysubstance abuse who presented to [facility] ED with shortness of breath and edema. Patient found to have acute vs. Chronic renal failure and cardiomyopathy. Request for tunnneled dialysis catheter by Dr. Carolin Sicks in the setting of elevated SCr.   Past Medical History:  Diagnosis Date  . 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 Candidiasis of mouth   . Chest pain, unspecified   . Diseases of lips    Angular cheilitis  . HIV infection ([place])   . Hypertension   . Lymphoma ([place])   . Personal history of unspecified disease of respiratory system   . Pneumocystosis ([place])   . Shingles   . Tobacco use     History reviewed. No pertinent surgical history.  Allergies: Penicillins  Medications: Prior to Admission medications   Medication Sig Start Date End Date Taking? Authorizing Provider  benazepril (LOTENSIN) 5 MG tablet Take 1 tablet (5 mg total) by mouth daily. Patient not taking: Reported on 04/23/2017 01/19/17   [name], MD  elvitegravir-cobicistat-emtricitabine-tenofovir (GENVOYA) 150-150-200-10 MG TABS tablet Take 1 tablet by mouth daily with breakfast. Patient not taking: Reported on 04/23/2017 01/19/17   [name], MD  fluconazole (DIFLUCAN) 100 MG tablet Take 1 tablet by mouth daily for one week then switched to one tablet each week Patient not taking: Reported on 05/20/2017 04/23/17   [name], MD  magic mouthwash SOLN Take 5 mLs by mouth 3 (three) times daily as needed. Patient not taking: Reported on 04/23/2017 04/07/16   [name], MD  senna-docusate (SENOKOT-S) 8.6-50 MG per tablet Take 1 tablet by mouth daily. 05/26/12  05/26/13  [name], MD  sulfamethoxazole-trimethoprim (BACTRIM) 400-80 MG tablet Take 1 tablet by mouth 2 (two) times daily. Patient not taking: Reported on 05/20/2017 04/23/17   [name], MD     Family History  Problem Relation Age of Onset  . Heart disease Mother   . Diabetes Mother   . Cancer Mother   . Diabetes Sister   . CAD Sister        ? stents  . Diabetes Maternal Grandmother   . Hypertension Brother     Social History   Socioeconomic History  . Marital status: Single    Spouse name: Not on file  . Number of children: Not on file  . Years of education: Not on file  . Highest education level: Not on file  Occupational History  . Not on file  Social Needs  . Financial resource strain: Not on file  . Food insecurity:    Worry: Not on file    Inability: Not on file  . Transportation needs:    Medical: Not on file    Non-medical: Not on file  Tobacco Use  . Smoking status: Former Smoker    Packs/day: 1.00    Years: 35.00    Pack years: 35.00    Last attempt to quit: 11/23/2015    Years since quitting: 2.4  . Smokeless tobacco: Never Used  . Tobacco comment: would like help quitting  Substance and Sexual Activity  . Alcohol use: Yes    Alcohol/week: 0.0 oz  Comment: beer weekends  . Drug use: No  . Sexual activity: Yes    Partners: Female    Comment: pt. declined condoms  Lifestyle  . Physical activity:    Days per week: Not on file    Minutes per session: Not on file  . Stress: Not on file  Relationships  . Social connections:    Talks on phone: Not on file    Gets together: Not on file    Attends religious service: Not on file    Active member of club or organization: Not on file    Attends meetings of clubs or organizations: Not on file    Relationship status: Not on file  Other Topics Concern  . Not on file  Social History Narrative   Single - unemployed   + EtOH weekend beer   Former smoker   + THC     Review of  Systems: A 12 point ROS discussed and pertinent positives are indicated in the HPI above.  All other systems are negative.  Review of Systems  Constitutional: Negative for fatigue and fever.  Respiratory: Positive for shortness of breath (PTA). Negative for cough.   Cardiovascular: Positive for chest pain (PTA, none at present).  Gastrointestinal: Negative for abdominal pain.  Musculoskeletal: Negative for back pain.  Psychiatric/Behavioral: Negative for behavioral problems and confusion.    Vital Signs: BP 138/77 (BP Location: Right Arm)   Pulse 77   Temp 98.2 F (36.8 C) (Oral)   Resp 20   Ht 5\' 5"  (1.651 m)   Wt 178 lb (80.7 kg)   SpO2 97%   BMI 29.62 kg/m   Physical Exam  Constitutional: He is oriented to person, place, and time. He appears well-developed.  Neck: Normal range of motion. Neck supple. No tracheal deviation present.  Cardiovascular: Normal rate, regular rhythm and normal heart sounds.  Pulmonary/Chest: Effort normal and breath sounds normal. No tachypnea. No respiratory distress.  Lymphadenopathy:    He has no cervical adenopathy.  Neurological: He is alert and oriented to person, place, and time.  Skin: Skin is warm and dry.  Psychiatric: He has a normal mood and affect. His behavior is normal.  Nursing note and vitals reviewed.    MD Evaluation Airway: WNL Heart: WNL Abdomen: WNL Chest/ Lungs: WNL ASA  Classification: 3 Mallampati/Airway Score: Two   Imaging: Dg Chest 2 View  Result Date: 05/10/2018 CLINICAL DATA:  Short of breath EXAM: CHEST - 2 VIEW COMPARISON:  12/26/2009 FINDINGS: The heart size and mediastinal contours are within normal limits. Both lungs are clear. The visualized skeletal structures are unremarkable. IMPRESSION: No active cardiopulmonary disease. Electronically Signed   By: Franchot Gallo M.D.   On: 05/10/2018 15:24   US Renal  Result Date: 05/10/2018 CLINICAL DATA:  AKI, elevated BUN and creatinine. EXAM: RENAL / URINARY  TRACT ULTRASOUND COMPLETE COMPARISON:  None. FINDINGS: Right Kidney: Length: 12 cm. RIGHT renal cortex is diffusely echogenic. Probable small cyst measuring 12 mm. No suspicious cortical mass or hydronephrosis seen. Left Kidney: Length: 13.5 cm. LEFT renal cortex is also diffusely echogenic. No suspicious mass or hydronephrosis seen. Bladder: Appears normal for degree of bladder distention. IMPRESSION: 1. Both kidneys are diffusely echogenic indicating medical renal disease. No suspicious mass or hydronephrosis within either kidney. 2. Bladder is unremarkable, partially decompressed. Electronically Signed   By: Franki Cabot M.D.   On: 05/10/2018 21:15   Nm Myocar Multi W/spect W/wall Motion / Ef  Result Date: 05/14/2018 CLINICAL  DATA:  57 year old with chest pain. Intermediate/high probability. EXAM: MYOCARDIAL IMAGING WITH SPECT (REST AND PHARMACOLOGIC-STRESS) GATED LEFT VENTRICULAR WALL MOTION STUDY LEFT VENTRICULAR EJECTION FRACTION TECHNIQUE: Standard myocardial SPECT imaging was performed after resting intravenous injection of 10 mCi Tc-5m tetrofosmin. Subsequently, intravenous infusion of Lexiscan was performed under the supervision of the Cardiology staff. At peak effect of the drug, 30 mCi Tc-68m tetrofosmin was injected intravenously and standard myocardial SPECT imaging was performed. Quantitative gated imaging was also performed to evaluate left ventricular wall motion, and estimate left ventricular ejection fraction. COMPARISON:  Chest radiograph 05/10/2018 FINDINGS: Perfusion: Mild to moderate reversibility along the septal wall, particularly in the mid segment. Findings are concerning for ischemia in this area. No other areas are concerning for reversibility or ischemia. There is fixed defect along the inferior wall which could be related to an infarct. Wall Motion: Diffuse hypokinesia throughout the left ventricle. Left ventricle is dilated. Left Ventricular Ejection Fraction: 38 % End diastolic  volume 604 ml End systolic volume 540 ml IMPRESSION: 1. Mild to moderate reversibility involving the septal wall. Findings are suggestive for ischemia in this area. 2. Fixed defect along the inferior wall may be related to an infarct. 3. Left ventricular ejection fraction is 38% with diffuse hypokinesia in the left ventricle. 4. Non invasive risk stratification*: Intermediate *2012 Appropriate Use Criteria for Coronary Revascularization Focused Update: J Am Coll Cardiol. 9811;91(4):782-956. http://content.airportbarriers.com.aspx?articleid=[email or web address] Electronically Signed   By: Markus Daft M.D.   On: 05/14/2018 14:33   Dg Abd Portable 1v  Result Date: 05/12/2018 CLINICAL DATA:  Nausea, vomiting, and diarrhea today. Also left lower quadrant pain. History of HIV, lymphoma, CHF. EXAM: PORTABLE ABDOMEN - 1 VIEW COMPARISON:  Abdominal and pelvic CT scan of May 20, 2017 FINDINGS: The bowel gas pattern is within the limits of normal. There are no abnormal soft tissue calcifications. 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 The lumbar spine and visualized portions of the pelvis are unremarkable. IMPRESSION: No acute intra-abdominal or pelvic abnormality is observed. Electronically Signed   By: David  Martinique M.D.   On: 05/12/2018 12:34    Labs:  CBC: Recent Labs    05/13/18 0241 05/14/18 0253 05/15/18 0222 05/16/18 0236  WBC 6.0 7.4 6.0 7.4  HGB 8.2* 9.0* 8.2* 8.2*  HCT 25.4* 27.9* 25.3* 25.6*  PLT 259 266 252 268    COAGS: Recent Labs    05/10/18 1749  INR 1.04    BMP: Recent Labs    05/13/18 0241 05/14/18 0253 05/15/18 0222 05/16/18 0236  NA 143  143 142 142  141 141  K 3.6  3.6 3.4* 3.7  3.7 3.6  CL 116*  116* 114* 114*  114* 113*  CO2 16*  17* 17* 18*  18* 19*  GLUCOSE 95  95 93 109*  110* 107*  BUN 65*  66* 62* 60*  59* 56*  CALCIUM 7.5*  7.6*  7.3* 7.8* 7.7*  7.7* 7.8*  CREATININE 7.10*  7.10* 7.36* 7.85*  7.80* 8.04*  GFRNONAA 8*  8* 7* 7*  7* 7*  GFRAA 9*   9* 9* 8*  8* 8*    LIVER FUNCTION TESTS: Recent Labs    05/20/17 1037 05/10/18 1749  05/13/18 0241 05/14/18 0253 05/15/18 0222 05/16/18 0236  BILITOT 0.7 0.5  --  0.5  --   --   --   AST 30 26  --  21  --   --   --   ALT 25 16*  --  13  --   --   --  ALKPHOS 40 36*  --  40  --   --   --   PROT 6.7 6.2*  --  5.7*  --   --   --   ALBUMIN 2.8* 1.5*   < > 1.8*  1.8* 1.9* 2.0* 1.9*   < > = values in this interval not displayed.    TUMOR MARKERS: No results for input(s): AFPTM, CEA, CA199, CHROMGRNA in the last 8760 hours.  Assessment and Plan: Patient with past medical history of HIV, polysubstance abuse, HTN presents with complaint of renal failure, edema.  IR consulted for tunneled dialysis catheter at the request of Dr. Carolin Sicks. NPO after midnight.  Check INR.   Risks and benefits discussed with the patient including, but not limited to bleeding, infection, vascular injury, pneumothorax which may require chest tube placement, air embolism or even death  All of the patient's questions were answered, patient is agreeable to proceed. Consent signed and in chart.  Thank you for this interesting consult.  I greatly enjoyed meeting ALPHEUS STIFF and look forward to participating in their care.  A copy of this report was sent to the requesting provider on this date.  Electronically Signed: Docia Barrier, PA 05/16/2018, 2:23 PM   I spent a total of 40 Minutes    in face to face in clinical consultation, greater than 50% of which was counseling/coordinating care for renal failure.

## 2018-05-16 NOTE — Progress Notes (Addendum)
Subjective: Out states that he thinks the HIV medications are making him feel dizzy and he says that Dr. [name] knows which medications do this.   Antibiotics:  Anti-infectives (From admission, onward)   Start     Dose/Rate Route Frequency Ordered Stop   05/16/18 1430  vancomycin (VANCOCIN) IVPB 1000 mg/200 mL premix     1,000 mg 200 mL/hr over 60 Minutes Intravenous On call 05/16/18 1424 05/17/18 1430   05/13/18 1500  dapsone tablet 100 mg     100 mg Oral Daily 05/13/18 1332     05/12/18 0730  darunavir-cobicistat (PREZCOBIX) 800-150 MG per tablet 1 tablet     1 tablet Oral Daily with breakfast 05/12/18 0047     05/12/18 0730  dolutegravir (TIVICAY) tablet 50 mg     50 mg Oral Daily with breakfast 05/12/18 0047     05/12/18 0730  rilpivirine (EDURANT) tablet 25 mg     25 mg Oral Daily with breakfast 05/12/18 0047     05/12/18 0700  dolutegravir (TIVICAY) tablet 50 mg  Status:  Discontinued     50 mg Oral Daily with breakfast 05/11/18 1521 05/12/18 0047   05/12/18 0700  rilpivirine (EDURANT) tablet 25 mg  Status:  Discontinued     25 mg Oral Daily with breakfast 05/11/18 1521 05/12/18 0047   05/12/18 0700  darunavir-cobicistat (PREZCOBIX) 800-150 MG per tablet 1 tablet  Status:  Discontinued     1 tablet Oral Daily with breakfast 05/11/18 1521 05/12/18 0047   05/11/18 1015  azithromycin (ZITHROMAX) tablet 1,200 mg     1,200 mg Oral Weekly 05/11/18 1007     05/11/18 1015  sulfamethoxazole-trimethoprim (BACTRIM,SEPTRA) 400-80 MG per tablet 1 tablet  Status:  Discontinued     1 tablet Oral Daily 05/11/18 1007 05/11/18 1228   05/11/18 0800  elvitegravir-cobicistat-emtricitabine-tenofovir (GENVOYA) 150-150-200-10 MG tablet 1 tablet  Status:  Discontinued     1 tablet Oral Daily with breakfast 05/10/18 1819 05/10/18 1931   05/11/18 0800  darunavir (PREZISTA) tablet 800 mg  Status:  Discontinued     800 mg Oral Daily with breakfast 05/10/18 1819 05/10/18 1931       Medications: Scheduled Meds: . amLODipine  10 mg Oral Daily  . aspirin  81 mg Oral Daily  . azithromycin  1,200 mg Oral Weekly  . calcium acetate  667 mg Oral TID WC  . carvedilol  25 mg Oral BID WC  . Chlorhexidine Gluconate Cloth  6 each Topical Q0600  . dapsone  100 mg Oral Daily  . darunavir-cobicistat  1 tablet Oral Q breakfast  . dolutegravir  50 mg Oral Q breakfast  . furosemide  40 mg Intravenous BID  . isosorbide-hydrALAZINE  1 tablet Oral TID  . rilpivirine  25 mg Oral Q breakfast  . senna-docusate  1 tablet Oral Daily  . sodium bicarbonate  1,300 mg Oral BID   Continuous Infusions: . sodium chloride Stopped (05/12/18 1526)  . vancomycin     PRN Meds:.sodium chloride, acetaminophen **OR** [DISCONTINUED] acetaminophen, bisacodyl, HYDROcodone-acetaminophen, labetalol, ondansetron **OR** ondansetron (ZOFRAN) IV, senna-docusate, traMADol    Objective: Weight change:   Intake/Output Summary (Last 24 hours) at 05/16/2018 1446 Last data filed at 05/16/2018 1002 Gross per 24 hour  Intake 120 ml  Output 1450 ml  Net -1330 ml   Blood pressure 138/77, pulse 77, temperature 98.2 F (36.8 C), temperature source Oral, resp. rate 20, height _0  (1.651 m), weight 178 lb (  80.7 kg), SpO2 97 %. Temp:  [98.2 F (36.8 C)-98.3 F (36.8 C)] 98.2 F (36.8 C) (06/30 0845) Pulse Rate:  [76-77] 77 (06/30 0845) Resp:  [16-20] 20 (06/30 0845) BP: (138-157)/(77-91) 138/77 (06/30 0845) SpO2:  [97 %-100 %] 97 % (06/30 0845)  Physical Exam: General: Alert and awake, oriented x3, but more dysphoric than yesterday.  Breakfast HEENT: anicteric sclera, EOMI CVS regular rate, normal  Chest: , no wheezing, no respiratory distress Abdomen: soft non-distended,  Neuro: nonfocal  CBC:    BMET Recent Labs    05/15/18 0222 05/16/18 0236  NA 142  141 141  K 3.7  3.7 3.6  CL 114*  114* 113*  CO2 18*  18* 19*  GLUCOSE 109*  110* 107*  BUN 60*  59* 56*  CREATININE 7.85*   7.80* 8.04*  CALCIUM 7.7*  7.7* 7.8*     Liver Panel  Recent Labs    05/15/18 0222 05/16/18 0236  ALBUMIN 2.0* 1.9*       Sedimentation Rate No results for input(s): ESRSEDRATE in the last 72 hours. C-Reactive Protein No results for input(s): CRP in the last 72 hours.  Micro Results: Recent Results (from the past 720 hour(s))  MRSA PCR Screening     Status: None   Collection Time: 05/10/18 11:00 PM  Result Value Ref Range Status   MRSA by PCR NEGATIVE NEGATIVE Final    Comment:        The GeneXpert MRSA Assay (FDA approved for NASAL specimens only), is one component of a comprehensive MRSA colonization surveillance program. It is not intended to diagnose MRSA infection nor to guide or monitor treatment for MRSA infections. Performed at [facility], [place] [address] [place], [place] [address]     Studies/Results: No results found.    Assessment/Plan:  INTERVAL HISTORY: He thinks that his antiretrovirals are responsible for some of the symptoms of dizziness which I highly doubt.   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 male with a 23 year old man with HIV and AIDS with poor adherence to antiretroviral therapy prior his lymphoma that was treated with chronic kidney disease that is worsened with acute on chronic renal failure.  He is currently on the inpatient service with hopes that he may have renal recovery.  He has been changed to a more renal based regimen  of TIVICAY PREZCOBIX and RPV. HLA B701 pending  #1 HIV AIDS:   cumultative genotype available shows following pertinent Resistance:  abacavir (ABC)  Low-Level Resistance zidovudine (AZT) Potential Low-Level Resistance emtricitabine (FTC) High-Level Resistance lamivudine (3TC) High-Level Resistance tenofovir (TDF) Susceptible  for adhererence sake the best regimen for him would be SYMTUZA that is FDA indicated for individuals who have S to TNF and DRV. While TAF thought to be [facility] safer than TDF we dont want to any chances in current setting.  Certainly I think PREZCOBIX and TIVICAY make a  lot of sense for him.  I worry very much aboutRILPVIRINE  being helpful drug in this patient  It does REQUIRE MEALS with it and it requires patient to not take ANTACIDS whether the ones they can buy at the store or ones foisted upon them by providers   I think for outpatient world however that Prezcobix/Tivicay and Doravirine would give him 3 FULLY active drugs without concern for need for food or antacids  I ACTUALLY was able to order this med as an inpatient med so I will switch him to it today.   I do not think that his dizziness is due to ANY of his ARV's. He has a history of TERRIBLE ADHERENCE to medications.  He has been on protease inhibitors including darunavir in the past and integrase inhibitors including raltegravir EVG.  If we think the NNRTI is causing problems Iexchange to [place] may make it better vs  or go to a 2 drug regimen such as DTG and PREZCOBIX    I will touch place with the infectious disease pharmacist on Monday and we will try to see if we can get medications filled for him at [facility] that can be delivered to his room and placed under the care of his nurse so that they are ready for him at time of discharge.  At present there does not appear to be much else going on that is requiring our expertise in the terms of his HIV.    Therefore for now I will sign off  please call us back when he nears discharge to ensure that we have done what we can to make sure he has medications in hand when he physically leaves the building.        LOS: 6 days   Alcide Evener 05/16/2018, 2:46 PM

## 2018-05-16 NOTE — Progress Notes (Signed)
Antivirals management:  Since we don't have doravirine here yet because it has not been to [facility], we will continue to use rilpivirine for now. Dr. Tommy Medal is ok with it.   Onnie Boer, PharmD, BCPS, AAHIVP, CPP Infectious Disease Pharmacist Pager: [phone] 05/16/2018 3:21 PM

## 2018-05-16 NOTE — Progress Notes (Signed)
[facility] NEPHROLOGY PROGRESS NOTE  Assessment/ Plan: Pt is a 57 y.o. yo male with history of uncontrolled hypertension, HIV, T-cell lymphoma, polysubstance abuse, noncompliant with the medication including HIV medications presented from ID clinic for chest pain, shortness of breath and lower extremity edema.  Consulted for renal failure.  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 Normal renal function in 05/2017. 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  On admission patient had hypertensive urgency with systolic blood pressure more than 200s.  Patient has bilateral echogenic kidneys, anemia,  acidosis points towards underlying CKD. -Benazepril and Bactrim were listed as home medication however patient was not taking any medication at home. -Urinalysis with protein, RBCs and WBCs. Urine PCR 4.29, unable to use ACEI or ARB because of renal failure. Now on HIV medication. Hepatitis B and C serology negative.    Serum creatinine elevated to 8.  Patient is on IV Lasix with urine output of around 1.4 L in 24 hours.  He still has fluid overload.  He has some nausea and weakness.  Discussed with patient and primary team.  Plan for tunneled catheter tomorrow and initiate hemodialysis. Since patient is now back on HIV medication, watch for renal recovery.  No plan for AV fistula for now.  #Hypertension: Blood pressure is better controlled.  Continue current medications.    #CHF with lower extremity edema: Echo with EF of 45 to 50% with mildly reduced systolic function.  Cardiac stress test with mild to moderate reversibility of septal wall suggesting ischemia.  Further evaluation per cardiology.  #Anemia:  fecal occult blood test positive. GI evaluated the patient.  Iron saturation 44%.    Hemoglobin 8.2.   Received dose of Aranesp on 6/25.  #Secondary hyperparathyroidism: Phosphorus 6.5, corrected ca 9.4, PTH 146. I will start phoslo.   Subjective: Seen and examined at bedside.  Reports generalized weakness and some nausea.  No chest pain or shortness of breath.  Objective Vital signs in last 24 hours: Vitals:   05/15/18 0803 05/15/18 1714 05/15/18 2239 05/16/18 0845  BP: (!) 162/90 (!) 153/91 (!) 157/87 138/77  Pulse:   76 77  Resp: 16 16 16 20   Temp: 98 F (36.7 C) 98.3 F (36.8 C) 98.2 F (36.8 C) 98.2 F (36.8 C)  TempSrc: Oral Oral Oral Oral  SpO2: 100% 97% 100% 97%  Weight:      Height:       Weight change:   Intake/Output Summary (Last 24 hours) at 05/16/2018 1128 Last data filed at 05/16/2018 1002 Gross per 24 hour  Intake 120 ml  Output 1450 ml  Net -1330 ml       Labs: Basic Metabolic Panel: Recent Labs  Lab 05/14/18 0253 05/15/18 0222 05/16/18 0236  NA 142 142  141 141  K 3.4* 3.7  3.7 3.6  CL 114* 114*  114* 113*  CO2 17* 18*  18* 19*  GLUCOSE 93 109*  110* 107*  BUN 62* 60*  59* 56*  CREATININE 7.36* 7.85*  7.80* 8.04*  CALCIUM 7.8* 7.7*  7.7* 7.8*  PHOS 6.4* 6.8* 6.5*   Liver Function Tests: Recent Labs  Lab 05/10/18 1749  05/13/18 0241 05/14/18 0253 05/15/18 0222 05/16/18 0236  AST 26  --  21  --   --   --   ALT 16*  --  13  --   --   --  ALKPHOS 36*  --  40  --   --   --   BILITOT 0.5  --  0.5  --   --   --   PROT 6.2*  --  5.7*  --   --   --   ALBUMIN 1.5*   < > 1.8*  1.8* 1.9* 2.0* 1.9*   < > = values in this interval not displayed.   Recent Labs  Lab 05/13/18 0241  LIPASE 93*   No results for input(s): AMMONIA in the last 168 hours. CBC: Recent Labs  Lab 05/10/18 1424  05/13/18 0241 05/14/18 0253 05/15/18 0222 05/16/18 0236  WBC 4.8  --  6.0 7.4 6.0 7.4  HGB 7.2*   < > 8.2* 9.0* 8.2* 8.2*  HCT 22.9*  --  25.4* 27.9* 25.3* 25.6*  MCV 84.2  --  86.1 86.4 86.6 86.2  PLT 272  --  259 266 252 268   < > =  values in this interval not displayed.   Cardiac Enzymes: Recent Labs  Lab 05/10/18 1906 05/10/18 2225  TROPONINI 0.15* 0.18*   CBG: No results for input(s): GLUCAP in the last 168 hours.  Iron Studies:  No results for input(s): IRON, TIBC, TRANSFERRIN, FERRITIN in the last 72 hours. Studies/Results: Nm Myocar Multi W/spect W/wall Motion / Ef  Result Date: 05/14/2018 CLINICAL DATA:  57 year old with chest pain. Intermediate/high probability. EXAM: MYOCARDIAL IMAGING WITH SPECT (REST AND PHARMACOLOGIC-STRESS) GATED LEFT VENTRICULAR WALL MOTION STUDY LEFT VENTRICULAR EJECTION FRACTION TECHNIQUE: Standard myocardial SPECT imaging was performed after resting intravenous injection of 10 mCi Tc-50m tetrofosmin. Subsequently, intravenous infusion of Lexiscan was performed under the supervision of the Cardiology staff. At peak effect of the drug, 30 mCi Tc-65m tetrofosmin was injected intravenously and standard myocardial SPECT imaging was performed. Quantitative gated imaging was also performed to evaluate left ventricular wall motion, and estimate left ventricular ejection fraction. COMPARISON:  Chest radiograph 05/10/2018 FINDINGS: Perfusion: Mild to moderate reversibility along the septal wall, particularly in the mid segment. Findings are concerning for ischemia in this area. No other areas are concerning for reversibility or ischemia. There is fixed defect along the inferior wall which could be related to an infarct. Wall Motion: Diffuse hypokinesia throughout the left ventricle. Left ventricle is dilated. Left Ventricular Ejection Fraction: 38 % End diastolic volume 335 ml End systolic volume 456 ml IMPRESSION: 1. Mild to moderate reversibility involving the septal wall. Findings are suggestive for ischemia in this area. 2. Fixed defect along the inferior wall may be related to an infarct. 3. Left ventricular ejection fraction is 38% with diffuse hypokinesia in the left ventricle. 4. Non invasive  risk stratification*: Intermediate *2012 Appropriate Use Criteria for Coronary Revascularization Focused Update: J Am Coll Cardiol. 2563;89(3):734-287. http://[email or web address] Electronically Signed   By: Markus Daft M.D.   On: 05/14/2018 14:33    Medications: Infusions: . sodium chloride Stopped (05/12/18 1526)    Scheduled Medications: . amLODipine  10 mg Oral Daily  . aspirin  81 mg Oral Daily  . azithromycin  1,200 mg Oral Weekly  . carvedilol  25 mg Oral BID WC  . Chlorhexidine Gluconate Cloth  6 each Topical Q0600  . dapsone  100 mg Oral Daily  . darunavir-cobicistat  1 tablet Oral Q breakfast  . dolutegravir  50 mg Oral Q breakfast  . furosemide  40 mg Intravenous BID  . isosorbide-hydrALAZINE  1 tablet Oral TID  . rilpivirine  25 mg Oral Q breakfast  . senna-docusate  1 tablet Oral Daily  . sodium bicarbonate  1,300 mg Oral BID    have reviewed scheduled and prn medications.  Physical Exam: General: Not in distress, comfortable Heart: Regular rate rhythm S1-S2 normal, no rubs Lungs: Clear bilateral, no crackles Abdomen:, Nontender, nondistended Extremities: Lower extremity edema +   Shareta Fishbaugh Prasad Prim Morace 05/16/2018,11:28 AM  LOS: 6 days

## 2018-05-16 NOTE — Progress Notes (Signed)
Noted plans for tunneled HD catheter tomorrow and to start HD. Recommend continued medical management of CAD for now - further work-up would be pending recovery of renal function or not.  Pixie Casino, MD, [facility], [place] Director of the Advanced Lipid Disorders &  Cardiovascular Risk Reduction Clinic Diplomate of the American Board of Clinical Lipidology Attending Cardiologist  Direct Dial: [phone]  Fax: [phone]  Website:  [email or web address]

## 2018-05-17 ENCOUNTER — Other Ambulatory Visit: Payer: Self-pay | Admitting: Pharmacist Clinician (PhC)/ Clinical Pharmacy Specialist

## 2018-05-17 ENCOUNTER — Telehealth: Payer: Self-pay | Admitting: *Deleted

## 2018-05-17 ENCOUNTER — Inpatient Hospital Stay (HOSPITAL_COMMUNITY): Payer: Medicare Other

## 2018-05-17 ENCOUNTER — Encounter (HOSPITAL_COMMUNITY): Payer: Self-pay | Admitting: Interventional Radiology

## 2018-05-17 DIAGNOSIS — I5021 Acute systolic (congestive) heart failure: Secondary | ICD-10-CM

## 2018-05-17 HISTORY — PX: IR FLUORO GUIDE CV LINE RIGHT: IMG2283

## 2018-05-17 HISTORY — PX: IR US GUIDE VASC ACCESS RIGHT: IMG2390

## 2018-05-17 LAB — CBC
HCT: 26.5 % — ABNORMAL LOW (ref 39.0–52.0)
Hemoglobin: 8.5 g/dL — ABNORMAL LOW (ref 13.0–17.0)
MCH: 27.8 pg (ref 26.0–34.0)
MCHC: 32.1 g/dL (ref 30.0–36.0)
MCV: 86.6 fL (ref 78.0–100.0)
PLATELETS: 283 10*3/uL (ref 150–400)
RBC: 3.06 MIL/uL — ABNORMAL LOW (ref 4.22–5.81)
RDW: 17.2 % — AB (ref 11.5–15.5)
WBC: 7.4 10*3/uL (ref 4.0–10.5)

## 2018-05-17 LAB — BASIC METABOLIC PANEL
ANION GAP: 10 (ref 5–15)
BUN: 57 mg/dL — ABNORMAL HIGH (ref 6–20)
CALCIUM: 7.9 mg/dL — AB (ref 8.9–10.3)
CO2: 19 mmol/L — AB (ref 22–32)
CREATININE: 8.15 mg/dL — AB (ref 0.61–1.24)
Chloride: 111 mmol/L (ref 98–111)
GFR, EST AFRICAN AMERICAN: 8 mL/min — AB (ref >60)
GFR, EST NON AFRICAN AMERICAN: 7 mL/min — AB (ref >60)
Glucose, Bld: 103 mg/dL — ABNORMAL HIGH (ref 70–99)
Potassium: 3.6 mmol/L (ref 3.5–5.1)
Sodium: 140 mmol/L (ref 135–145)

## 2018-05-17 LAB — HLA B*5701: HLA B 5701: NEGATIVE

## 2018-05-17 LAB — MAGNESIUM: Magnesium: 2 mg/dL (ref 1.7–2.4)

## 2018-05-17 LAB — PROTIME-INR
INR: 1.04
PROTHROMBIN TIME: 13.5 s (ref 11.4–15.2)

## 2018-05-17 MED ORDER — LIDOCAINE HCL 1 % IJ SOLN
INTRAMUSCULAR | Status: AC
  Filled 2018-05-17: qty 20

## 2018-05-17 MED ORDER — DOLUTEGRAVIR SODIUM 50 MG PO TABS: 50.0000 mg | ORAL_TABLET | Freq: Every day | ORAL | 3 refills | Status: DC

## 2018-05-17 MED ORDER — MIDAZOLAM HCL 2 MG/2ML IJ SOLN
INTRAMUSCULAR | Status: AC | PRN
  Administered 2018-05-17 (×2): 1 mg via INTRAVENOUS

## 2018-05-17 MED ORDER — DAPSONE 100 MG PO TABS
100.0000 mg | ORAL_TABLET | Freq: Every day | ORAL | 5 refills | Status: DC
Start: 2018-05-17 — End: 2018-05-26

## 2018-05-17 MED ORDER — DORAVIRINE 100 MG PO TABS: 100.0000 mg | ORAL_TABLET | Freq: Every day | ORAL | 3 refills | Status: DC

## 2018-05-17 MED ORDER — HEPARIN SODIUM (PORCINE) 1000 UNIT/ML IJ SOLN
INTRAMUSCULAR | Status: AC
  Filled 2018-05-17: qty 1

## 2018-05-17 MED ORDER — LIDOCAINE HCL (PF) 1 % IJ SOLN
INTRAMUSCULAR | Status: AC | PRN
  Administered 2018-05-17: 2 mL

## 2018-05-17 MED ORDER — FENTANYL CITRATE (PF) 100 MCG/2ML IJ SOLN
INTRAMUSCULAR | Status: AC | PRN
  Administered 2018-05-17: 50 ug via INTRAVENOUS

## 2018-05-17 MED ORDER — FENTANYL CITRATE (PF) 100 MCG/2ML IJ SOLN
INTRAMUSCULAR | Status: AC
  Filled 2018-05-17: qty 2

## 2018-05-17 MED ORDER — DARUNAVIR-COBICISTAT 800-150 MG PO TABS: 1.0000 | ORAL_TABLET | Freq: Every day | ORAL | 3 refills | Status: DC

## 2018-05-17 MED ORDER — SODIUM CHLORIDE 0.9 % IV SOLN
INTRAVENOUS | Status: AC | PRN
  Administered 2018-05-17: 10 mL/h via INTRAVENOUS

## 2018-05-17 MED ORDER — MIDAZOLAM HCL 2 MG/2ML IJ SOLN
INTRAMUSCULAR | Status: AC
  Filled 2018-05-17: qty 2

## 2018-05-17 MED ORDER — HEPARIN SODIUM (PORCINE) 1000 UNIT/ML IJ SOLN
INTRAMUSCULAR | Status: AC | PRN
  Administered 2018-05-17: 3200 [IU] via INTRAVENOUS

## 2018-05-17 MED FILL — DAPSONE 100 MG TABLET: 100 | 30 days supply | Qty: 30 | Fill #0 | Status: TO

## 2018-05-17 MED FILL — PIFELTRO 100 MG TABS: 100 | 30 days supply | Qty: 30 | Fill #0

## 2018-05-17 MED FILL — TIVICAY 50 MG TABLET: 50 | 30 days supply | Qty: 30 | Fill #0

## 2018-05-17 MED FILL — PREZCOBIX 800 MG-150 MG TAB: 800-150 | 30 days supply | Qty: 30 | Fill #0

## 2018-05-17 NOTE — Progress Notes (Signed)
S: complains of some tenderness at [facility] site O:BP 140/82   Pulse 75   Temp 97.9 F (36.6 C) (Oral)   Resp 15   Ht _0  (1.651 m)   Wt 80.7 kg (178 lb)   SpO2 (!) 89%   BMI 29.62 kg/m   Intake/Output Summary (Last 24 hours) at 05/17/2018 1330 Last data filed at 05/16/2018 2222 Gross per 24 hour  Intake 480 ml  Output 675 ml  Net -195 ml   Intake/Output: I/O last 3 completed shifts: In: 8299 [P.O.:1560] Out: 3716 [Urine:1625]  Intake/Output this shift:  No intake/output data recorded. Weight change:  Gen: NAD CVS: no rub Resp: cta Abd: benign Ext: no edema  Recent Labs  Lab 05/10/18 1749 05/11/18 1521 05/12/18 0902 05/13/18 0241 05/14/18 0253 05/15/18 0222 05/16/18 0236 05/17/18 0206  NA 141 141 140 143  143 142 142  141 141 140  K 3.8 3.9 3.4* 3.6  3.6 3.4* 3.7  3.7 3.6 3.6  CL 113* 117* 115* 116*  116* 114* 114*  114* 113* 111  CO2 15* 13* 15* 16*  17* 17* 18*  18* 19* 19*  GLUCOSE 102* 137* 109* 95  95 93 109*  110* 107* 103*  BUN 75* 71* 66* 65*  66* 62* 60*  59* 56* 57*  CREATININE 5.91* 5.92* 6.11* 7.10*  7.10* 7.36* 7.85*  7.80* 8.04* 8.15*  ALBUMIN 1.5* 1.8*  --  1.8*  1.8* 1.9* 2.0* 1.9*  --   CALCIUM 7.3* 7.5* 7.8* 7.5*  7.6*  7.3* 7.8* 7.7*  7.7* 7.8* 7.9*  PHOS  --  7.2*  --  6.4* 6.4* 6.8* 6.5*  --   AST 26  --   --  21  --   --   --   --   ALT 16*  --   --  13  --   --   --   --    Liver Function Tests: Recent Labs  Lab 05/10/18 1749  05/13/18 0241 05/14/18 0253 05/15/18 0222 05/16/18 0236  AST 26  --  21  --   --   --   ALT 16*  --  13  --   --   --   ALKPHOS 36*  --  40  --   --   --   BILITOT 0.5  --  0.5  --   --   --   PROT 6.2*  --  5.7*  --   --   --   ALBUMIN 1.5*   < > 1.8*  1.8* 1.9* 2.0* 1.9*   < > = values in this interval not displayed.   Recent Labs  Lab 05/13/18 0241  LIPASE 93*   No results for input(s): AMMONIA in the last 168 hours. CBC: Recent Labs  Lab 05/13/18 0241 05/14/18 0253  05/15/18 0222 05/16/18 0236 05/17/18 0206  WBC 6.0 7.4 6.0 7.4 7.4  HGB 8.2* 9.0* 8.2* 8.2* 8.5*  HCT 25.4* 27.9* 25.3* 25.6* 26.5*  MCV 86.1 86.4 86.6 86.2 86.6  PLT 259 266 252 268 283   Cardiac Enzymes: Recent Labs  Lab 05/10/18 1906 05/10/18 2225  TROPONINI 0.15* 0.18*   CBG: No results for input(s): GLUCAP in the last 168 hours.  Iron Studies: No results for input(s): IRON, TIBC, TRANSFERRIN, FERRITIN in the last 72 hours. Studies/Results: Ir Fluoro Guide Cv Line Right  Result Date: 05/17/2018 INDICATION: End-stage renal disease. In need of durable intravenous access for the initiation of dialysis.  EXAM: TUNNELED CENTRAL VENOUS HEMODIALYSIS CATHETER PLACEMENT WITH ULTRASOUND AND FLUOROSCOPIC GUIDANCE MEDICATIONS: Vancomycin 1 gm IV . The antibiotic was given in an appropriate time interval prior to skin puncture. ANESTHESIA/SEDATION: Versed 2 mg IV; Fentanyl 50 mcg IV; Moderate Sedation Time:  17 minutes The patient was continuously monitored during the procedure by the interventional radiology nurse under my direct supervision. FLUOROSCOPY TIME:  Fluoroscopy Time: 24 seconds (5 mGy). COMPLICATIONS: None immediate. PROCEDURE: Informed written consent was obtained from the patient after a discussion of the risks, benefits, and alternatives to treatment. Questions regarding the procedure were encouraged and answered. The right neck and chest were prepped with chlorhexidine in a sterile fashion, and a sterile drape was applied covering the operative field. Maximum barrier sterile technique with sterile gowns and gloves were used for the procedure. A timeout was performed prior to the initiation of the procedure. After creating a small venotomy incision, a micropuncture kit was utilized to access the internal jugular vein. Real-time ultrasound guidance was utilized for vascular access including the acquisition of a permanent ultrasound image documenting patency of the accessed vessel. The  microwire was utilized to measure appropriate catheter length. A stiff Glidewire was advanced to the level of the IVC and the micropuncture sheath was exchanged for a peel-away sheath. A palindrome tunneled hemodialysis catheter measuring 19 cm from tip to cuff was tunneled in a retrograde fashion from the anterior chest wall to the venotomy incision. The catheter was then placed through the peel-away sheath with tips ultimately positioned within the superior aspect of the right atrium. Final catheter positioning was confirmed and documented with a spot radiographic image. The catheter aspirates and flushes normally. The catheter was flushed with appropriate volume heparin dwells. The catheter exit site was secured with a 0-Prolene retention suture. The venotomy incision was closed with an interrupted 4-0 Vicryl, Dermabond and Steri-strips. Dressings were applied. The patient tolerated the procedure well without immediate post procedural complication. IMPRESSION: Successful placement of 19 cm tip to cuff tunneled hemodialysis catheter via the right internal jugular vein with tips terminating within the superior aspect of the right atrium. The catheter is ready for immediate use. Electronically Signed   By: Sandi Mariscal M.D.   On: 05/17/2018 10:09   Ir US Guide Vasc Access Right  Result Date: 05/17/2018 INDICATION: End-stage renal disease. In need of durable intravenous access for the initiation of dialysis. EXAM: TUNNELED CENTRAL VENOUS HEMODIALYSIS CATHETER PLACEMENT WITH ULTRASOUND AND FLUOROSCOPIC GUIDANCE MEDICATIONS: Vancomycin 1 gm IV . The antibiotic was given in an appropriate time interval prior to skin puncture. ANESTHESIA/SEDATION: Versed 2 mg IV; Fentanyl 50 mcg IV; Moderate Sedation Time:  17 minutes The patient was continuously monitored during the procedure by the interventional radiology nurse under my direct supervision. FLUOROSCOPY TIME:  Fluoroscopy Time: 24 seconds (5 mGy). COMPLICATIONS: None  immediate. PROCEDURE: Informed written consent was obtained from the patient after a discussion of the risks, benefits, and alternatives to treatment. Questions regarding the procedure were encouraged and answered. The right neck and chest were prepped with chlorhexidine in a sterile fashion, and a sterile drape was applied covering the operative field. Maximum barrier sterile technique with sterile gowns and gloves were used for the procedure. A timeout was performed prior to the initiation of the procedure. After creating a small venotomy incision, a micropuncture kit was utilized to access the internal jugular vein. Real-time ultrasound guidance was utilized for vascular access including the acquisition of a permanent ultrasound image documenting patency of the  accessed vessel. The microwire was utilized to measure appropriate catheter length. A stiff Glidewire was advanced to the level of the IVC and the micropuncture sheath was exchanged for a peel-away sheath. A palindrome tunneled hemodialysis catheter measuring 19 cm from tip to cuff was tunneled in a retrograde fashion from the anterior chest wall to the venotomy incision. The catheter was then placed through the peel-away sheath with tips ultimately positioned within the superior aspect of the right atrium. Final catheter positioning was confirmed and documented with a spot radiographic image. The catheter aspirates and flushes normally. The catheter was flushed with appropriate volume heparin dwells. The catheter exit site was secured with a 0-Prolene retention suture. The venotomy incision was closed with an interrupted 4-0 Vicryl, Dermabond and Steri-strips. Dressings were applied. The patient tolerated the procedure well without immediate post procedural complication. IMPRESSION: Successful placement of 19 cm tip to cuff tunneled hemodialysis catheter via the right internal jugular vein with tips terminating within the superior aspect of the right  atrium. The catheter is ready for immediate use. Electronically Signed   By: Sandi Mariscal M.D.   On: 05/17/2018 10:09   . amLODipine  10 mg Oral Daily  . aspirin  81 mg Oral Daily  . azithromycin  1,200 mg Oral Weekly  . calcium acetate  667 mg Oral TID WC  . carvedilol  25 mg Oral BID WC  . Chlorhexidine Gluconate Cloth  6 each Topical Q0600  . dapsone  100 mg Oral Daily  . darunavir-cobicistat  1 tablet Oral Q breakfast  . dolutegravir  50 mg Oral Q breakfast  . furosemide  40 mg Intravenous BID  . isosorbide-hydrALAZINE  1 tablet Oral TID  . lidocaine      . rilpivirine  25 mg Oral Q breakfast  . senna-docusate  1 tablet Oral Daily  . sodium bicarbonate  1,300 mg Oral BID    BMET    Component Value Date/Time   NA 140 05/17/2018 0206   K 3.6 05/17/2018 0206   CL 111 05/17/2018 0206   CO2 19 (L) 05/17/2018 0206   GLUCOSE 103 (H) 05/17/2018 0206   BUN 57 (H) 05/17/2018 0206   CREATININE 8.15 (H) 05/17/2018 0206   CREATININE 0.67 (L) 04/23/2017 1728   CALCIUM 7.9 (L) 05/17/2018 0206   CALCIUM 7.3 (L) 05/13/2018 0241   GFRNONAA 7 (L) 05/17/2018 0206   GFRNONAA >89 09/14/2014 0947   GFRAA 8 (L) 05/17/2018 0206   GFRAA >89 09/14/2014 0947   CBC    Component Value Date/Time   WBC 7.4 05/17/2018 0206   RBC 3.06 (L) 05/17/2018 0206   HGB 8.5 (L) 05/17/2018 0206   HGB 8.9 (L) 05/12/2018 1038   HGB 13.7 11/20/2008 1531   HCT 26.5 (L) 05/17/2018 0206   HCT 40.5 11/20/2008 1531   PLT 283 05/17/2018 0206   PLT 172 11/20/2008 1531   MCV 86.6 05/17/2018 0206   MCV 90.7 11/20/2008 1531   MCH 27.8 05/17/2018 0206   MCHC 32.1 05/17/2018 0206   RDW 17.2 (H) 05/17/2018 0206   RDW 15.0 (H) 11/20/2008 1531   LYMPHSABS 1,054 04/23/2017 1728   LYMPHSABS 1.5 11/20/2008 1531   MONOABS 465 04/23/2017 1728   MONOABS 0.3 11/20/2008 1531   EOSABS 93 04/23/2017 1728   EOSABS 0.1 11/20/2008 1531   BASOSABS 31 04/23/2017 1728   BASOSABS 0.0 11/20/2008 1531      Assessment/Plan:  1. AKI/progressive CKD due to poorly controlled HTN and HIV.  Creatinine  has continued to climb and [place] consistent with chronic medical renal disease.  He is s/p [facility] placement today and if scheduled for his first HD session today.  He will need longterm access placement and will consult VVS.  He may regain some renal function with treatment of his HIV and HTN but for now will require HD. 2. HIV- poorly controlled.  Started on Tivicay, prezcobix, and RPV per ID. 3. Dilated CMP- UF with HD and cont with carvedilol and bidil per Cardiology 4. Uncontrolled HTN- improved with meds 5. Anemia of chronic disease- will start esa with HD and check iron stores 6. Severe protein malnutrition- consider protein supplements 7. Disposition- will be difficult if he lives in [facility] and has not had any nephrology care there compounded by his history on nonadherence with medications and follow up.  Donetta Potts, MD [employer] [phone]

## 2018-05-17 NOTE — Progress Notes (Signed)
Progress Note  Patient Name: [name] Date of Encounter: 05/17/2018  Primary Cardiologist: Pixie Casino, MD   Subjective   No significant shortness of breath, no chest pain.  Inpatient Medications    Scheduled Meds: . amLODipine  10 mg Oral Daily  . aspirin  81 mg Oral Daily  . azithromycin  1,200 mg Oral Weekly  . calcium acetate  667 mg Oral TID WC  . carvedilol  25 mg Oral BID WC  . Chlorhexidine Gluconate Cloth  6 each Topical Q0600  . dapsone  100 mg Oral Daily  . darunavir-cobicistat  1 tablet Oral Q breakfast  . dolutegravir  50 mg Oral Q breakfast  . fentaNYL      . furosemide  40 mg Intravenous BID  . heparin      . isosorbide-hydrALAZINE  1 tablet Oral TID  . lidocaine      . midazolam      . rilpivirine  25 mg Oral Q breakfast  . senna-docusate  1 tablet Oral Daily  . sodium bicarbonate  1,300 mg Oral BID   Continuous Infusions: . sodium chloride Stopped (05/12/18 1526)   PRN Meds: sodium chloride, acetaminophen **OR** [DISCONTINUED] acetaminophen, bisacodyl, HYDROcodone-acetaminophen, labetalol, ondansetron **OR** ondansetron (ZOFRAN) IV, senna-docusate, traMADol   Vital Signs    Vitals:   05/17/18 0950 05/17/18 0955 05/17/18 1000 05/17/18 1006  BP: (!) 145/94 (!) 150/97 (!) 144/82 140/82  Pulse: 73 74 71 75  Resp: '15 16 15 15  ' Temp:      TempSrc:      SpO2: 96% 95% 93% (!) 89%  Weight:      Height:        Intake/Output Summary (Last 24 hours) at 05/17/2018 1153 Last data filed at 05/16/2018 2222 Gross per 24 hour  Intake 960 ml  Output 675 ml  Net 285 ml   Filed Weights   05/13/18 0500 05/14/18 0533 05/15/18 0601  Weight: 186 lb 1.1 oz (84.4 kg) 185 lb 3 oz (84 kg) 178 lb (80.7 kg)    Telemetry    No adverse arrhythmias- Personally Reviewed  ECG    Sinus rhythm with nonspecific ST T wave depression.- Personally Reviewed  Physical Exam   GEN: No acute distress.   Neck: No JVD Cardiac: RRR, no murmurs, rubs, or gallops.   Dialysis catheter in place Respiratory: Clear to auscultation bilaterally. GI: Soft, nontender, non-distended  MS: No edema; No deformity. Neuro:  Nonfocal  Psych: Normal affect   Labs    Chemistry Recent Labs  Lab 05/10/18 1749  05/13/18 0241 05/14/18 0253 05/15/18 0222 05/16/18 0236 05/17/18 0206  NA 141   < > 143  143 142 142  141 141 140  K 3.8   < > 3.6  3.6 3.4* 3.7  3.7 3.6 3.6  CL 113*   < > 116*  116* 114* 114*  114* 113* 111  CO2 15*   < > 16*  17* 17* 18*  18* 19* 19*  GLUCOSE 102*   < > 95  95 93 109*  110* 107* 103*  BUN 75*   < > 65*  66* 62* 60*  59* 56* 57*  CREATININE 5.91*   < > 7.10*  7.10* 7.36* 7.85*  7.80* 8.04* 8.15*  CALCIUM 7.3*   < > 7.5*  7.6*  7.3* 7.8* 7.7*  7.7* 7.8* 7.9*  PROT 6.2*  --  5.7*  --   --   --   --  ALBUMIN 1.5*   < > 1.8*  1.8* 1.9* 2.0* 1.9*  --   AST 26  --  21  --   --   --   --   ALT 16*  --  13  --   --   --   --   ALKPHOS 36*  --  40  --   --   --   --   BILITOT 0.5  --  0.5  --   --   --   --   GFRNONAA 10*   < > 8*  8* 7* 7*  7* 7* 7*  GFRAA 11*   < > 9*  9* 9* 8*  8* 8* 8*  ANIONGAP 13   < > '11  10 11 10  9 9 10   ' < > = values in this interval not displayed.     Hematology Recent Labs  Lab 05/15/18 0222 05/16/18 0236 05/17/18 0206  WBC 6.0 7.4 7.4  RBC 2.92* 2.97* 3.06*  HGB 8.2* 8.2* 8.5*  HCT 25.3* 25.6* 26.5*  MCV 86.6 86.2 86.6  MCH 28.1 27.6 27.8  MCHC 32.4 32.0 32.1  RDW 16.9* 17.2* 17.2*  PLT 252 268 283    Cardiac Enzymes Recent Labs  Lab 05/10/18 1906 05/10/18 2225  TROPONINI 0.15* 0.18*    Recent Labs  Lab 05/10/18 1457  TROPIPOC 0.11*     BNP Recent Labs  Lab 05/10/18 1424  BNP 788.2*     DDimer No results for input(s): DDIMER in the last 168 hours.   Radiology    Ir Fluoro Guide Cv Line Right  Result Date: 05/17/2018 INDICATION: End-stage renal disease. In need of durable intravenous access for the initiation of dialysis. EXAM: TUNNELED CENTRAL  VENOUS HEMODIALYSIS CATHETER PLACEMENT WITH ULTRASOUND AND FLUOROSCOPIC GUIDANCE MEDICATIONS: Vancomycin 1 gm IV . The antibiotic was given in an appropriate time interval prior to skin puncture. ANESTHESIA/SEDATION: Versed 2 mg IV; Fentanyl 50 mcg IV; Moderate Sedation Time:  17 minutes The patient was continuously monitored during the procedure by the interventional radiology nurse under my direct supervision. FLUOROSCOPY TIME:  Fluoroscopy Time: 24 seconds (5 mGy). COMPLICATIONS: None immediate. PROCEDURE: Informed written consent was obtained from the patient after a discussion of the risks, benefits, and alternatives to treatment. Questions regarding the procedure were encouraged and answered. The right neck and chest were prepped with chlorhexidine in a sterile fashion, and a sterile drape was applied covering the operative field. Maximum barrier sterile technique with sterile gowns and gloves were used for the procedure. A timeout was performed prior to the initiation of the procedure. After creating a small venotomy incision, a micropuncture kit was utilized to access the internal jugular vein. Real-time ultrasound guidance was utilized for vascular access including the acquisition of a permanent ultrasound image documenting patency of the accessed vessel. The microwire was utilized to measure appropriate catheter length. A stiff Glidewire was advanced to the level of the IVC and the micropuncture sheath was exchanged for a peel-away sheath. A palindrome tunneled hemodialysis catheter measuring 19 cm from tip to cuff was tunneled in a retrograde fashion from the anterior chest wall to the venotomy incision. The catheter was then placed through the peel-away sheath with tips ultimately positioned within the superior aspect of the right atrium. Final catheter positioning was confirmed and documented with a spot radiographic image. The catheter aspirates and flushes normally. The catheter was flushed with  appropriate volume heparin dwells. The catheter exit site  was secured with a 0-Prolene retention suture. The venotomy incision was closed with an interrupted 4-0 Vicryl, Dermabond and Steri-strips. Dressings were applied. The patient tolerated the procedure well without immediate post procedural complication. IMPRESSION: Successful placement of 19 cm tip to cuff tunneled hemodialysis catheter via the right internal jugular vein with tips terminating within the superior aspect of the right atrium. The catheter is ready for immediate use. Electronically Signed   By: Sandi Mariscal M.D.   On: 05/17/2018 10:09   Ir US Guide Vasc Access Right  Result Date: 05/17/2018 INDICATION: End-stage renal disease. In need of durable intravenous access for the initiation of dialysis. EXAM: TUNNELED CENTRAL VENOUS HEMODIALYSIS CATHETER PLACEMENT WITH ULTRASOUND AND FLUOROSCOPIC GUIDANCE MEDICATIONS: Vancomycin 1 gm IV . The antibiotic was given in an appropriate time interval prior to skin puncture. ANESTHESIA/SEDATION: Versed 2 mg IV; Fentanyl 50 mcg IV; Moderate Sedation Time:  17 minutes The patient was continuously monitored during the procedure by the interventional radiology nurse under my direct supervision. FLUOROSCOPY TIME:  Fluoroscopy Time: 24 seconds (5 mGy). COMPLICATIONS: None immediate. PROCEDURE: Informed written consent was obtained from the patient after a discussion of the risks, benefits, and alternatives to treatment. Questions regarding the procedure were encouraged and answered. The right neck and chest were prepped with chlorhexidine in a sterile fashion, and a sterile drape was applied covering the operative field. Maximum barrier sterile technique with sterile gowns and gloves were used for the procedure. A timeout was performed prior to the initiation of the procedure. After creating a small venotomy incision, a micropuncture kit was utilized to access the internal jugular vein. Real-time ultrasound  guidance was utilized for vascular access including the acquisition of a permanent ultrasound image documenting patency of the accessed vessel. The microwire was utilized to measure appropriate catheter length. A stiff Glidewire was advanced to the level of the IVC and the micropuncture sheath was exchanged for a peel-away sheath. A palindrome tunneled hemodialysis catheter measuring 19 cm from tip to cuff was tunneled in a retrograde fashion from the anterior chest wall to the venotomy incision. The catheter was then placed through the peel-away sheath with tips ultimately positioned within the superior aspect of the right atrium. Final catheter positioning was confirmed and documented with a spot radiographic image. The catheter aspirates and flushes normally. The catheter was flushed with appropriate volume heparin dwells. The catheter exit site was secured with a 0-Prolene retention suture. The venotomy incision was closed with an interrupted 4-0 Vicryl, Dermabond and Steri-strips. Dressings were applied. The patient tolerated the procedure well without immediate post procedural complication. IMPRESSION: Successful placement of 19 cm tip to cuff tunneled hemodialysis catheter via the right internal jugular vein with tips terminating within the superior aspect of the right atrium. The catheter is ready for immediate use. Electronically Signed   By: Sandi Mariscal M.D.   On: 05/17/2018 10:09    Cardiac Studies   ECHO:05/11/18 - Left ventricle: The cavity size was normal. Wall thickness was   increased in a pattern of moderate LVH. Systolic function was   mildly reduced. The estimated ejection fraction was in the range   of 45% to 50%. Diffuse hypokinesis. Doppler parameters are   consistent with abnormal left ventricular relaxation (grade 1   diastolic dysfunction). - Left atrium: The atrium was moderately dilated. - Pericardium, extracardiac: A trivial pericardial effusion was   identified.  Patient  Profile     57 y.o. male with newly discovered cardiomyopathy, HIV, end-stage renal  disease  Assessment & Plan     Dilated cardiomyopathy - Fluid management per hemodialysis -Continue with carvedilol and hydralazine/isosorbide -Agree with Dr. Debara Pickett, will forego invasive work-up at this time.  Continue with medical management.  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  HIV -Per primary team, infectious disease.  End-stage renal disease -Per nephrology, catheter in place  [place] will sign off.   Medication Recommendations: As above, no changes Other recommendations (labs, testing, etc): No further cardiac testing Follow up as an outpatient: With Dr. Debara Pickett or APP in 2 to 4 weeks  For questions or updates, please contact [place] Please consult [email or web address] for contact info under Cardiology/STEMI.      Signed, Candee Furbish, MD  05/17/2018, 11:53 AM

## 2018-05-17 NOTE — Progress Notes (Signed)
Dr. Tommy Medal would like to put him on Prezcobix/Tiivicay/Doravirine. He has a medicare plan that will cover them. We will send to [place]. Will pick up later and deliver to the patient at the hospital.

## 2018-05-17 NOTE — Progress Notes (Signed)
Hemodialysis canceled for today by Dr. Marval Regal.  Rescheduled for 05-18-18 Patient and patient's nurse Shayne Alken, RN at 1630 by Almira Bar, RN. Orders written for today will be used for next scheduled hemodialysis treatment unless a new order is written

## 2018-05-17 NOTE — Progress Notes (Signed)
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 on 05/10/2018 w/ right-sided chest pain with radiation to the right arm, nausea, and SOB after walking a short distance.  He also reported B LE swelling, paroxysmal nocturnal dyspnea, and bright red blood per rectum for a weeks duration.  Rt Neck/Chest tunneled HD catheter placed on 05/17/18, first HD session on 05/17/18     Subjective:- c/o discomfort at hemodialysis catheter site, voiding well    Plan:-  1)HIV/AIDS- - Noncompliant with medication.- - HIV medication restarted,   CD4 count was 50,   ID consult appreciated, Dr Tommy Medal states that  Prezcobix/Tivicay and Doravirine would give him 3 FULLY active drugs without concern for need for food or antacids, changed to a more renal based regimen of TIVICAY PREZCOBIX and RPV. HLA B701 pending -Azithromycin 1200 mg weekly, dapsone weekly,  , -Prezcobix 800-150 mg daily, -Tivicay 50 mg dailym, -Edurant 25 mg daily  Infectious disease service is signing off as of 05/16/2018  2)Acute renal failure- - creatinine is now > 8, multifactorial hypertensive nephropathy, HIV nephropathy.  -Discussed case with nephrology Dr. Blake Divine ,   Rt Neck/Chest  tunneled HD catheter placed on 05/17/18, first HD session on 05/17/18, was on  Lasix iv 40 mg bid for lower extremity edema, significant concerns about patient's ability to be compliant with hemodialysis should he be initiated on HD  - Avoid nephrotoxic medication Recent Labs  Lab 05/11/18 1521 05/12/18 0902 05/13/18 0241 05/14/18 0253 05/15/18 0222 05/16/18 0236 05/17/18 0206  CREATININE 5.92* 6.11* 7.10*  7.10* 7.36* 7.85*  7.80* 8.04* 8.15*    3)Acute systolic and diastolic CHF/chest pain.- Strict in and out, -Daily weight, - Transfuse for hemoglobin<8, -Cardiology consult appreciated, Myoview stress test on 05/14/2017 is abnormal, awaiting further cardiology input, , c/n Amlodipine 10 mg daily, BiDil,- Lasix  , C/n coreg 25 mg twice daily, lasix -  ,  aspirin. Hold off on statin due to HAART and advanced renal disease.  Cardiology service is signing off as of 05/17/2018  4)Uncontrolled essential HTN- - Multifactorial, but mostly due to noncompliance with medication, - c/n Norvasc, Coreg, Lasix, BiDil   5)GI bleed/BRBPR- -  Currently patient refuses colonoscopy., -GI input appreciated,  GI signed off as of 05/16/2018  6)Normocytic anemia- -Anemia panel consistent with anemia of chronic disease.  Suspect a combination of AIDS +Renal disease does not appear to have significant GI bleed. ESA/Procrit as per nephrology team  7)Chronic boil of buttocks- -Hx of chronic "boil" of buttock, -I&D in ED.  Culture not sent   8)Polysubstance abuse-- 6/25 UDS positive marijuana  9)Goal of care -6/26 PALLIATIVE CARE: Noncompliant  patient with HIV, acute systolic CHF, acute renal failure possibly going on HD discussed change of status to DNR, discussed short-term vs long-term goals of care  10)CAD--Myoview stress test from 05/14/2018 with reversible ischemia along the septal wall, cardiologist recommends medical management, concerns about early complications if left heart catheterization is done , also  should angioplasty and stenting be done patient would not likely be compliant with dual antiplatelet therapy.  Cardiology service is signing off as of 05/17/2018   DVT prophylaxis: SCD Code Status: Full Family Communication: None Disposition Plan:  Once HIV Meds can be obtained and if ok with Nephrology  Consultants:  Cardiology ID Nephrology GI Palliative IR Rt Neck/Chest tunneled HD catheter placed on 05/17/18,  Procedures/Significant Events:  6/25 echocardiogram:Left ventricle: LVEF =45% to 50%. Diffuse hypokinesis.- Grade 1   diastolic dysfunction. - Left atrium:  moderately dilated.  05/14/2018 abnormal Myoview stress test  Rt Neck/Chest tunneled HD catheter placed on 05/17/18,  Cultures 6/24 MRSA PCR negative  Antimicrobials: Anti-infectives (From admission, onward)   Start     Stop   05/12/18 0730  darunavir-cobicistat (PREZCOBIX) 800-150 MG per tablet 1 tablet         05/12/18 0730  dolutegravir (TIVICAY) tablet 50 mg         05/12/18 0730  rilpivirine (EDURANT) tablet 25 mg         05/12/18 0700  dolutegravir (TIVICAY) tablet 50 mg  Status:  Discontinued     05/12/18 0047   05/12/18 0700  rilpivirine (EDURANT) tablet 25 mg  Status:  Discontinued     05/12/18 0047   05/12/18 0700  darunavir-cobicistat (PREZCOBIX) 800-150 MG per tablet 1 tablet  Status:  Discontinued     05/12/18 0047   05/11/18 1015  azithromycin (ZITHROMAX) tablet 1,200 mg         05/11/18 1015  sulfamethoxazole-trimethoprim (BACTRIM,SEPTRA) 400-80 MG per tablet 1 tablet  Status:  Discontinued     05/11/18 1228   05/11/18 0800  elvitegravir-cobicistat-emtricitabine-tenofovir (GENVOYA) 150-150-200-10 MG tablet 1 tablet  Status:  Discontinued     05/10/18 1931   05/11/18 0800  darunavir (PREZISTA) tablet 800 mg  Status:  Discontinued     05/10/18 1931       Continuous Infusions: . sodium chloride Stopped (05/12/18 1526)     Objective: Vitals:   05/17/18 0950 05/17/18 0955 05/17/18 1000  05/17/18 1006  BP: (!) 145/94 (!) 150/97 (!) 144/82 140/82  Pulse: 73 74 71 75  Resp: _0 Temp:      TempSrc:      SpO2: 96% 95% 93% (!) 89%  Weight:      Height:        Intake/Output Summary (Last 24 hours) at 05/17/2018 1223 Last data filed at 05/16/2018 2222 Gross per 24 hour  Intake 960 ml  Output 675 ml  Net 285 ml   Filed Weights   05/13/18 0500 05/14/18 0533 05/15/18 0601  Weight: 84.4 kg (186 lb 1.1 oz) 84 kg (185 lb 3 oz) 80.7 kg (178 lb)    Examination:  Physical Exam  Gen:- Awake Alert, in no apparent distress  HEENT:- [place].AT, No sclera icterus Neck-Supple Neck,No JVD, Rt Neck/Chest tunneled HD catheter placed on 05/17/18,  Lungs-   no wheezing, fair air movement CV- S1, S2 normal Abd-  +ve B.Sounds, Abd Soft, No tenderness,    Extremity-1-2+ pitting edema,    Skin: Boil on the right ureter buttocks at the crack scab in place Psych-affect  is appropriate, oriented x3 Neuro-no new focal deficits, no tremors   CBC: Recent Labs  Lab 05/13/18 0241 05/14/18 0253 05/15/18 0222 05/16/18 0236 05/17/18 0206  WBC 6.0 7.4 6.0 7.4 7.4  HGB 8.2* 9.0* 8.2* 8.2* 8.5*  HCT 25.4* 27.9* 25.3* 25.6* 26.5*  MCV 86.1 86.4 86.6 86.2 86.6  PLT 259 266 252 268 989   Basic Metabolic Panel: Recent Labs  Lab 05/11/18 1521  05/13/18 0241 05/14/18 0253 05/15/18 0222 05/16/18 0236 05/17/18 0206  NA 141   < > 143  143 142 142  141 141 140  K 3.9   < > 3.6  3.6 3.4* 3.7  3.7 3.6 3.6  CL 117*   < > 116*  116* 114* 114*  114* 113* 111  CO2 13*   < > 16*  17* 17* 18*  18* 19* 19*  GLUCOSE 137*   < > 95  95 93 109*  110* 107* 103*  BUN 71*   < > 65*  66* 62* 60*  59* 56* 57*  CREATININE 5.92*   < > 7.10*  7.10* 7.36* 7.85*  7.80* 8.04* 8.15*  CALCIUM 7.5*   < > 7.5*  7.6*  7.3* 7.8* 7.7*  7.7* 7.8* 7.9*  MG  --    < > 1.8 1.6* 2.1 1.9 2.0  PHOS 7.2*  --  6.4* 6.4* 6.8* 6.5*  --    < > = values in this interval not displayed.   GFR: Estimated  Creatinine Clearance: 9.9 mL/min (A) (by C-G formula based on SCr of 8.15 mg/dL (H)). Liver Function Tests: Recent Labs  Lab 05/10/18 1749 05/11/18 1521 05/13/18 0241 05/14/18 0253 05/15/18 0222 05/16/18 0236  AST 26  --  21  --   --   --   ALT 16*  --  13  --   --   --   ALKPHOS 36*  --  40  --   --   --   BILITOT 0.5  --  0.5  --   --   --   PROT 6.2*  --  5.7*  --   --   --   ALBUMIN 1.5* 1.8* 1.8*  1.8* 1.9* 2.0* 1.9*   Recent Labs  Lab 05/13/18 0241  LIPASE 93*   No results for input(s): AMMONIA in the last 168 hours. Coagulation Profile: Recent Labs  Lab 05/10/18 1749 05/17/18 0206  INR 1.04 1.04   Cardiac Enzymes: Recent Labs  Lab 05/10/18 1906 05/10/18 2225  TROPONINI 0.15* 0.18*   BNP (last 3 results) No results for input(s): PROBNP in the last 8760 hours. HbA1C: No results for input(s): HGBA1C in the last 72 hours. CBG: No results for input(s): GLUCAP in the last 168 hours. Lipid Profile: No results for input(s): CHOL, HDL, LDLCALC, TRIG, CHOLHDL, LDLDIRECT in the last 72 hours. Thyroid Function Tests: No results for input(s): TSH, T4TOTAL, FREET4, T3FREE, THYROIDAB in the last 72 hours. Anemia Panel: No results for input(s): VITAMINB12, FOLATE, FERRITIN, TIBC, IRON, RETICCTPCT in the last 72 hours. Urine analysis:    Component Value Date/Time   COLORURINE YELLOW 05/11/2018 0606   APPEARANCEUR HAZY (A) 05/11/2018 0606   LABSPEC 1.012 05/11/2018 0606   PHURINE 5.0 05/11/2018 0606   GLUCOSEU NEGATIVE 05/11/2018 0606   GLUCOSEU NEG mg/dL 11/23/2006 2036   HGBUR MODERATE (A) 05/11/2018 0606   BILIRUBINUR NEGATIVE 05/11/2018 0606   KETONESUR NEGATIVE 05/11/2018 0606   PROTEINUR >=300 (A) 05/11/2018 0606   UROBILINOGEN 0.2 11/23/2006  2036   NITRITE NEGATIVE 05/11/2018 0606   LEUKOCYTESUR NEGATIVE 05/11/2018 0606   Sepsis Labs: _0 (procalcitonin:4,lacticidven:4)  ) Recent Results (from the past 240 hour(s))  MRSA PCR Screening      Status: None   Collection Time: 05/10/18 11:00 PM  Result Value Ref Range Status   MRSA by PCR NEGATIVE NEGATIVE Final    Comment:        The GeneXpert MRSA Assay (FDA approved for NASAL specimens only), is one component of a comprehensive MRSA colonization surveillance program. It is not intended to diagnose MRSA infection nor to guide or monitor treatment for MRSA infections. Performed at [facility], [place] [address] [place], [place] [address]     Radiology Studies: Ir Fluoro Guide Cv Line Right  Result Date: 05/17/2018 INDICATION: End-stage renal disease. In need of durable intravenous access for the initiation of dialysis. EXAM: TUNNELED CENTRAL VENOUS HEMODIALYSIS CATHETER PLACEMENT WITH ULTRASOUND AND FLUOROSCOPIC GUIDANCE MEDICATIONS: Vancomycin 1 gm IV . The antibiotic was given in an appropriate time interval prior to skin puncture. ANESTHESIA/SEDATION: Versed 2 mg IV; Fentanyl 50 mcg IV; Moderate Sedation Time:  17 minutes The patient was continuously monitored during the procedure by the interventional radiology nurse under my direct supervision. FLUOROSCOPY TIME:  Fluoroscopy Time: 24 seconds (5 mGy). COMPLICATIONS: None immediate. PROCEDURE: Informed written consent was obtained from the patient after a discussion of the risks, benefits, and alternatives to treatment. Questions regarding the procedure were encouraged and answered. The right neck and chest were prepped with chlorhexidine in a sterile fashion, and a sterile drape was applied covering the operative field. Maximum barrier sterile technique with sterile gowns and gloves were used for the procedure. A timeout was performed prior to the initiation of the procedure. After creating a small venotomy incision, a micropuncture kit was utilized to access the internal jugular vein. Real-time ultrasound guidance was utilized for vascular access including the acquisition of a permanent ultrasound image documenting patency of  the accessed vessel. The microwire was utilized to measure appropriate catheter length. A stiff Glidewire was advanced to the level of the IVC and the micropuncture sheath was exchanged for a peel-away sheath. A palindrome tunneled hemodialysis catheter measuring 19 cm from tip to cuff was tunneled in a retrograde fashion from the anterior chest wall to the venotomy incision. The catheter was then placed through the peel-away sheath with tips ultimately positioned within the superior aspect of the right atrium. Final catheter positioning was confirmed and documented with a spot radiographic image. The catheter aspirates and flushes normally. The catheter was flushed with appropriate volume heparin dwells. The catheter exit site was secured with a 0-Prolene retention suture. The venotomy incision was closed with an interrupted 4-0 Vicryl, Dermabond and Steri-strips. Dressings were applied. The patient tolerated the procedure well without immediate post procedural complication. IMPRESSION: Successful placement of 19 cm tip to cuff tunneled hemodialysis catheter via the right internal jugular vein with tips terminating within the superior aspect of the right atrium. The catheter is ready for immediate use. Electronically Signed   By: Sandi Mariscal M.D.   On: 05/17/2018 10:09   Ir US Guide Vasc Access Right  Result Date: 05/17/2018 INDICATION: End-stage renal disease. In need of durable intravenous access for the initiation of dialysis. EXAM: TUNNELED CENTRAL VENOUS HEMODIALYSIS CATHETER PLACEMENT WITH ULTRASOUND AND FLUOROSCOPIC GUIDANCE MEDICATIONS: Vancomycin 1 gm IV . The antibiotic was given in an appropriate time interval prior to skin puncture. ANESTHESIA/SEDATION: Versed 2 mg IV; Fentanyl 50  mcg IV; Moderate Sedation Time:  17 minutes The patient was continuously monitored during the procedure by the interventional radiology nurse under my direct supervision. FLUOROSCOPY TIME:  Fluoroscopy Time: 24 seconds (5  mGy). COMPLICATIONS: None immediate. PROCEDURE: Informed written consent was obtained from the patient after a discussion of the risks, benefits, and alternatives to treatment. Questions regarding the procedure were encouraged and answered. The right neck and chest were prepped with chlorhexidine in a sterile fashion, and a sterile drape was applied covering the operative field. Maximum barrier sterile technique with sterile gowns and gloves were used for the procedure. A timeout was performed prior to the initiation of the procedure. After creating a small venotomy incision, a micropuncture kit was utilized to access the internal jugular vein. Real-time ultrasound guidance was utilized for vascular access including the acquisition of a permanent ultrasound image documenting patency of the accessed vessel. The microwire was utilized to measure appropriate catheter length. A stiff Glidewire was advanced to the level of the IVC and the micropuncture sheath was exchanged for a peel-away sheath. A palindrome tunneled hemodialysis catheter measuring 19 cm from tip to cuff was tunneled in a retrograde fashion from the anterior chest wall to the venotomy incision. The catheter was then placed through the peel-away sheath with tips ultimately positioned within the superior aspect of the right atrium. Final catheter positioning was confirmed and documented with a spot radiographic image. The catheter aspirates and flushes normally. The catheter was flushed with appropriate volume heparin dwells. The catheter exit site was secured with a 0-Prolene retention suture. The venotomy incision was closed with an interrupted 4-0 Vicryl, Dermabond and Steri-strips. Dressings were applied. The patient tolerated the procedure well without immediate post procedural complication. IMPRESSION: Successful placement of 19 cm tip to cuff tunneled hemodialysis catheter via the right internal jugular vein with tips terminating within the  superior aspect of the right atrium. The catheter is ready for immediate use. Electronically Signed   By: Sandi Mariscal M.D.   On: 05/17/2018 10:09    Scheduled Meds: . amLODipine  10 mg Oral Daily  . aspirin  81 mg Oral Daily  . azithromycin  1,200 mg Oral Weekly  . calcium acetate  667 mg Oral TID WC  . carvedilol  25 mg Oral BID WC  . Chlorhexidine Gluconate Cloth  6 each Topical Q0600  . dapsone  100 mg Oral Daily  . darunavir-cobicistat  1 tablet Oral Q breakfast  . dolutegravir  50 mg Oral Q breakfast  . furosemide  40 mg Intravenous BID  . isosorbide-hydrALAZINE  1 tablet Oral TID  . lidocaine      . rilpivirine  25 mg Oral Q breakfast  . senna-docusate  1 tablet Oral Daily  . sodium bicarbonate  1,300 mg Oral BID   Continuous Infusions: . sodium chloride Stopped (05/12/18 1526)     LOS: 7 days    Roxan Hockey, MD [facility]    If 7PM-7AM, please contact night-coverage [email or web address] Password [facility] 05/17/2018, 12:23 PM

## 2018-05-17 NOTE — Telephone Encounter (Signed)
Called to schedule 2-4 week  post hospital visit with Dr. Debara Pickett or APP ordered by Dr. Marlou Porch

## 2018-05-17 NOTE — Progress Notes (Signed)
Pt is alert and oriented x4.  Denies any pain.  Went for HD cath placement this morning.  Cath placed on right IJ, dressing clean, dry and intact.  Was supposed to go for HD today, but was switched to tomorrow, pt notified.  Plan for HD tomorrow.  Pt home prescriptions were filled and brought here by [facility] pharmacist.  RN placed them in pharmacy to be locked up until discharge, form placed in chart.  Will continue to monitior

## 2018-05-17 NOTE — Procedures (Signed)
Pre-procedure Diagnosis: ESRD Post-procedure Diagnosis: Same  Successful placement of tunneled HD catheter with tips terminating within the superior aspect of the right atrium.    Complications: None Immediate  EBL: Minimal   The catheter is ready for immediate use.   Jay Maximiliano Cromartie, MD Pager #: [phone]   

## 2018-05-17 NOTE — Progress Notes (Signed)
Will send dapsone to [facility] pharmacy also.

## 2018-05-17 NOTE — Progress Notes (Signed)
Transition of care:  Antiretrovirals (doravirine/Dolutegravir/Prezcobix) have been delivered to nurse Sharyn Lull to be given to patient at discharge. Spent a good amount of time counseling him on the new regimen. He said that he still resides in the [facility] area but likely to move back here.

## 2018-05-18 ENCOUNTER — Inpatient Hospital Stay (HOSPITAL_COMMUNITY): Payer: Medicare Other

## 2018-05-18 DIAGNOSIS — Z01810 Encounter for preprocedural cardiovascular examination: Secondary | ICD-10-CM

## 2018-05-18 LAB — CBC
HCT: 28.5 % — ABNORMAL LOW (ref 39.0–52.0)
Hemoglobin: 8.8 g/dL — ABNORMAL LOW (ref 13.0–17.0)
MCH: 27.2 pg (ref 26.0–34.0)
MCHC: 30.9 g/dL (ref 30.0–36.0)
MCV: 88.2 fL (ref 78.0–100.0)
PLATELETS: 321 10*3/uL (ref 150–400)
RBC: 3.23 MIL/uL — AB (ref 4.22–5.81)
RDW: 16.9 % — ABNORMAL HIGH (ref 11.5–15.5)
WBC: 8.3 10*3/uL (ref 4.0–10.5)

## 2018-05-18 LAB — BASIC METABOLIC PANEL
ANION GAP: 10 (ref 5–15)
BUN: 59 mg/dL — ABNORMAL HIGH (ref 6–20)
CALCIUM: 8.4 mg/dL — AB (ref 8.9–10.3)
CO2: 21 mmol/L — AB (ref 22–32)
Chloride: 108 mmol/L (ref 98–111)
Creatinine, Ser: 8.16 mg/dL — ABNORMAL HIGH (ref 0.61–1.24)
GFR calc Af Amer: 8 mL/min — ABNORMAL LOW (ref >60)
GFR calc non Af Amer: 7 mL/min — ABNORMAL LOW (ref >60)
GLUCOSE: 108 mg/dL — AB (ref 70–99)
Potassium: 4.3 mmol/L (ref 3.5–5.1)
Sodium: 139 mmol/L (ref 135–145)

## 2018-05-18 LAB — MAGNESIUM: Magnesium: 1.9 mg/dL (ref 1.7–2.4)

## 2018-05-18 NOTE — Progress Notes (Signed)
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 [name] on 05/10/2018 w/ right-sided chest pain with radiation to the right arm, nausea, and SOB after walking a short distance.  He also reported B LE swelling, paroxysmal nocturnal dyspnea, and bright red blood per rectum for a weeks duration.  Rt Neck/Chest tunneled HD catheter placed on 05/17/18, first HD session on 05/17/18     Subjective:-Tolerated hemodialysis well on 05/18/2018, requesting food  Plan:-  1)Acute renal failure- - creatinine is 8.16,  multifactorial hypertensive nephropathy, HIV nephropathy.  -Discussed case with nephrology Dr. Blake Divine ,   Rt Neck/Chest tunneled HD catheter placed on 05/17/18 (initiated on HD on 05/17/18), tolerated HD session on 05/18/18,   significant concerns about patient's ability to be compliant with hemodialysis should he be initiated on HD  - Avoid nephrotoxic medication Recent Labs  Lab 05/12/18 0902 05/13/18 0241 05/14/18 0253 05/15/18 0222 05/16/18 0236 05/17/18 0206 05/18/18 0317  CREATININE 6.11* 7.10*  7.10* 7.36* 7.85*  7.80* 8.04* 8.15* 8.16*    2)HIV/AIDS- - Noncompliant with medication.- - HIV medication restarted,   CD4 count was 50,   ID  consult appreciated, Dr Tommy Medal states that  Prezcobix/Tivicay and Doravirine would give him 3 FULLY active drugs without concern for need for food or antacids, changed to a more renal based regimen of TIVICAY PREZCOBIX and RPV. HLA B701 pending -Azithromycin 1200 mg weekly, dapsone weekly,  , -Prezcobix 800-150 mg daily, -Tivicay 50 mg dailym, -Edurant 25 mg daily  Infectious disease service is signing off as of 06/27/9146   3)Acute systolic and diastolic CHF/chest pain.-Use hemodialysis to address volume status, -Daily weight, - Transfuse for hemoglobin<8, -Cardiology consult appreciated, Myoview stress test on 05/14/2017 is abnormal, awaiting further cardiology input, , c/n Amlodipine 10 mg daily, BiDil,- Lasix  , C/n coreg 25 mg twice daily, lasix -  ,  aspirin. Hold off on statin due to HAART and advanced renal disease.  Cardiology service is signing off as of 05/17/2018  4)Uncontrolled essential HTN- - improved BP control, multifactorial, but mostly due to noncompliance with medication, - c/n Norvasc, Coreg, Lasix, BiDil   5)GI bleed/BRBPR- -  Currently patient refuses colonoscopy., -GI input appreciated,  GI signed off as of 05/16/2018  6)Normocytic anemia- -Anemia panel consistent with anemia of chronic disease.  Suspect a combination of AIDS +Renal disease does not appear to have significant GI bleed. ESA/Procrit as per nephrology team  7)Chronic boil of buttocks- -Hx of chronic "boil" of buttock, -I&D in ED.  Culture not sent   8)Polysubstance abuse-- 6/25 UDS positive marijuana  9)Goal of care -6/26 PALLIATIVE CARE: Noncompliant patient with HIV, acute systolic  CHF, acute renal failure possibly going on HD discussed change of status to DNR, discussed short-term vs long-term goals of care  10)CAD--Myoview stress test from 05/14/2018 with reversible ischemia along the septal wall, cardiologist recommends medical management, concerns about early complications if left heart catheterization is  done , also should angioplasty and stenting be done patient would not likely be compliant with dual antiplatelet therapy.  Cardiology service is signing off as of 05/17/2018  11)Disposition--- ??? LTAC needed, patient will need outpatient hemodialysis chair/spot signed prior to discharge home  DVT prophylaxis: SCD Code Status: Full Family Communication: None Disposition Plan:  Once HIV Meds can be obtained and if ok with Nephrology  Consultants:  Cardiology ID Nephrology GI Palliative IR Rt Neck/Chest tunneled HD catheter placed on 05/17/18,  Procedures/Significant Events:  6/25 echocardiogram:Left ventricle: LVEF =45% to 50%. Diffuse hypokinesis.- Grade 1   diastolic dysfunction. - Left atrium:  moderately dilated.  05/14/2018 abnormal Myoview stress test  Rt Neck/Chest tunneled HD catheter placed on 05/17/18,  Cultures 6/24 MRSA PCR negative  Antimicrobials: Anti-infectives (From admission, onward)   Start     Stop   05/12/18 0730  darunavir-cobicistat (PREZCOBIX) 800-150 MG per tablet 1 tablet         05/12/18 0730  dolutegravir (TIVICAY) tablet 50 mg         05/12/18 0730  rilpivirine (EDURANT) tablet 25 mg         05/12/18 0700  dolutegravir (TIVICAY) tablet 50 mg  Status:  Discontinued     05/12/18 0047   05/12/18 0700  rilpivirine (EDURANT) tablet 25 mg  Status:  Discontinued     05/12/18 0047   05/12/18 0700  darunavir-cobicistat (PREZCOBIX) 800-150 MG per tablet 1 tablet  Status:  Discontinued     05/12/18 0047   05/11/18 1015  azithromycin (ZITHROMAX) tablet 1,200 mg         05/11/18 1015  sulfamethoxazole-trimethoprim (BACTRIM,SEPTRA) 400-80 MG per tablet 1 tablet  Status:  Discontinued     05/11/18 1228   05/11/18 0800  elvitegravir-cobicistat-emtricitabine-tenofovir (GENVOYA) 150-150-200-10 MG tablet 1 tablet  Status:  Discontinued     05/10/18 1931   05/11/18 0800  darunavir (PREZISTA) tablet 800 mg  Status:  Discontinued     05/10/18 1931        Continuous Infusions: . sodium chloride Stopped (05/12/18 1526)     Objective: Vitals:   05/18/18 0800 05/18/18 0830 05/18/18 0957 05/18/18 1710  BP: (!) 142/85 128/82 137/87 116/70  Pulse: 65 65 89 66  Resp: _0 Temp:   97.8 F (36.6 C) (!) 97.5 F (36.4 C)  TempSrc:   Oral Oral  SpO2:   95% 95%  Weight:   81.1 kg (178 lb 12.7 oz)   Height:        Intake/Output Summary (Last 24 hours) at 05/18/2018 1731 Last data filed at 05/18/2018 1300 Gross per 24 hour  Intake 480 ml  Output 1457 ml  Net -977 ml   Filed Weights   05/18/18 0100 05/18/18 0750 05/18/18 0957  Weight: 83.1 kg (183 lb 4.8 oz) 82.8 kg (182 lb 8.7 oz) 81.1 kg (178 lb 12.7 oz)    Examination:  Physical Exam  Gen:- Awake Alert, in no apparent distress  HEENT:- [place].AT, No sclera icterus Neck-Supple Neck,No JVD, Rt Neck/Chest tunneled HD catheter placed on 05/17/18,  Lungs-   no wheezing, fair air movement CV- S1, S2 normal Abd-  +ve B.Sounds, Abd Soft, No tenderness,  Extremity-1 + pitting edema,    Psych-affect is appropriate, oriented x3 Neuro-no new focal deficits, no tremors   CBC: Recent Labs  Lab 05/14/18 0253 05/15/18 0222 05/16/18 0236 05/17/18 0206 05/18/18 0800  WBC 7.4 6.0 7.4 7.4 8.3  HGB 9.0* 8.2* 8.2* 8.5* 8.8*  HCT 27.9* 25.3* 25.6* 26.5* 28.5*  MCV 86.4 86.6 86.2 86.6 88.2  PLT 266 252 268 283 244   Basic Metabolic Panel: Recent Labs  Lab 05/13/18 0241 05/14/18 0253 05/15/18 0222 05/16/18 0236 05/17/18 0206 05/18/18 0317  NA 143  143 142 142  141 141 140 139  K 3.6  3.6 3.4* 3.7  3.7 3.6 3.6 4.3  CL 116*  116* 114* 114*  114* 113* 111 108  CO2 16*  17* 17* 18*  18* 19* 19* 21*  GLUCOSE 95  95 93 109*  110* 107* 103* 108*  BUN 65*  66* 62* 60*  59* 56* 57* 59*  CREATININE 7.10*  7.10* 7.36* 7.85*  7.80* 8.04* 8.15* 8.16*  CALCIUM 7.5*  7.6*  7.3* 7.8* 7.7*  7.7* 7.8* 7.9* 8.4*  MG 1.8 1.6* 2.1 1.9 2.0 1.9  PHOS 6.4* 6.4* 6.8* 6.5*  --    --    GFR: Estimated Creatinine Clearance: 9.9 mL/min (A) (by C-G formula based on SCr of 8.16 mg/dL (H)). Liver Function Tests: Recent Labs  Lab 05/13/18 0241 05/14/18 0253 05/15/18 0222 05/16/18 0236  AST 21  --   --   --   ALT 13  --   --   --   ALKPHOS 40  --   --   --   BILITOT 0.5  --   --   --   PROT 5.7*  --   --   --   ALBUMIN 1.8*  1.8* 1.9* 2.0* 1.9*   Recent Labs  Lab 05/13/18 0241  LIPASE 93*   No results for input(s): AMMONIA in the last 168 hours. Coagulation Profile: Recent Labs  Lab 05/17/18 0206  INR 1.04   Cardiac Enzymes: No results for input(s): CKTOTAL, CKMB, CKMBINDEX, TROPONINI in the last 168 hours. BNP (last 3 results) No results for input(s): PROBNP in the last 8760 hours. HbA1C: No results for input(s): HGBA1C in the last 72 hours. CBG: No results for input(s): GLUCAP in the last 168 hours. Lipid Profile: No results for input(s): CHOL, HDL, LDLCALC, TRIG, CHOLHDL, LDLDIRECT in the last 72 hours. Thyroid Function Tests: No results for input(s): TSH, T4TOTAL, FREET4, T3FREE, THYROIDAB in the last 72 hours. Anemia Panel: No results for input(s): VITAMINB12, FOLATE, FERRITIN, TIBC, IRON, RETICCTPCT in the last 72 hours. Urine analysis:    Component Value Date/Time   COLORURINE YELLOW 05/11/2018 0606   APPEARANCEUR HAZY (A) 05/11/2018 0606   LABSPEC 1.012 05/11/2018 0606   PHURINE 5.0 05/11/2018 0606   GLUCOSEU NEGATIVE 05/11/2018 0606   GLUCOSEU NEG mg/dL 11/23/2006 2036   HGBUR MODERATE (A) 05/11/2018 0606   BILIRUBINUR NEGATIVE 05/11/2018 0606   KETONESUR NEGATIVE 05/11/2018 0606   PROTEINUR >=300 (A) 05/11/2018 0606   UROBILINOGEN 0.2 11/23/2006 2036   NITRITE NEGATIVE 05/11/2018 0606   LEUKOCYTESUR NEGATIVE 05/11/2018 0606   Sepsis Labs: '@LABRCNTIP'$ (procalcitonin:4,lacticidven:4)  ) Recent Results (from the past 240 hour(s))  MRSA PCR Screening     Status: None   Collection Time: 05/10/18 11:00 PM  Result Value Ref Range  Status   MRSA by PCR NEGATIVE NEGATIVE Final    Comment:        The GeneXpert MRSA  Assay (FDA approved for NASAL specimens only), is one component of a comprehensive MRSA colonization surveillance program. It is not intended to diagnose MRSA infection nor to guide or monitor treatment for MRSA infections. Performed at [facility], [place] [address], [place], [place] [address]     Radiology Studies: Ir Fluoro Guide Cv Line Right  Result Date: 05/17/2018 INDICATION: End-stage renal disease. In need of durable intravenous access for the initiation of dialysis. EXAM: TUNNELED CENTRAL VENOUS HEMODIALYSIS CATHETER PLACEMENT WITH ULTRASOUND AND FLUOROSCOPIC GUIDANCE MEDICATIONS: Vancomycin 1 gm IV . The antibiotic was given in an appropriate time interval prior to skin puncture. ANESTHESIA/SEDATION: Versed 2 mg IV; Fentanyl 50 mcg IV; Moderate Sedation Time:  17 minutes The patient was continuously monitored during the procedure by the interventional radiology nurse under my direct supervision. FLUOROSCOPY TIME:  Fluoroscopy Time: 24 seconds (5 mGy). COMPLICATIONS: None immediate. PROCEDURE: Informed written consent was obtained from the patient after a discussion of the risks, benefits, and alternatives to treatment. Questions regarding the procedure were encouraged and answered. The right neck and chest were prepped with chlorhexidine in a sterile fashion, and a sterile drape was applied covering the operative field. Maximum barrier sterile technique with sterile gowns and gloves were used for the procedure. A timeout was performed prior to the initiation of the procedure. After creating a small venotomy incision, a micropuncture kit was utilized to access the internal jugular vein. Real-time ultrasound guidance was utilized for vascular access including the acquisition of a permanent ultrasound image documenting patency of the accessed vessel. The microwire was utilized to measure appropriate  catheter length. A stiff Glidewire was advanced to the level of the IVC and the micropuncture sheath was exchanged for a peel-away sheath. A palindrome tunneled hemodialysis catheter measuring 19 cm from tip to cuff was tunneled in a retrograde fashion from the anterior chest wall to the venotomy incision. The catheter was then placed through the peel-away sheath with tips ultimately positioned within the superior aspect of the right atrium. Final catheter positioning was confirmed and documented with a spot radiographic image. The catheter aspirates and flushes normally. The catheter was flushed with appropriate volume heparin dwells. The catheter exit site was secured with a 0-Prolene retention suture. The venotomy incision was closed with an interrupted 4-0 Vicryl, Dermabond and Steri-strips. Dressings were applied. The patient tolerated the procedure well without immediate post procedural complication. IMPRESSION: Successful placement of 19 cm tip to cuff tunneled hemodialysis catheter via the right internal jugular vein with tips terminating within the superior aspect of the right atrium. The catheter is ready for immediate use. Electronically Signed   By: Sandi Mariscal M.D.   On: 05/17/2018 10:09   Ir US Guide Vasc Access Right  Result Date: 05/17/2018 INDICATION: End-stage renal disease. In need of durable intravenous access for the initiation of dialysis. EXAM: TUNNELED CENTRAL VENOUS HEMODIALYSIS CATHETER PLACEMENT WITH ULTRASOUND AND FLUOROSCOPIC GUIDANCE MEDICATIONS: Vancomycin 1 gm IV . The antibiotic was given in an appropriate time interval prior to skin puncture. ANESTHESIA/SEDATION: Versed 2 mg IV; Fentanyl 50 mcg IV; Moderate Sedation Time:  17 minutes The patient was continuously monitored during the procedure by the interventional radiology nurse under my direct supervision. FLUOROSCOPY TIME:  Fluoroscopy Time: 24 seconds (5 mGy). COMPLICATIONS: None immediate. PROCEDURE: Informed written consent  was obtained from the patient after a discussion of the risks, benefits, and alternatives to treatment. Questions regarding the procedure were encouraged and answered. The right neck and chest were prepped  with chlorhexidine in a sterile fashion, and a sterile drape was applied covering the operative field. Maximum barrier sterile technique with sterile gowns and gloves were used for the procedure. A timeout was performed prior to the initiation of the procedure. After creating a small venotomy incision, a micropuncture kit was utilized to access the internal jugular vein. Real-time ultrasound guidance was utilized for vascular access including the acquisition of a permanent ultrasound image documenting patency of the accessed vessel. The microwire was utilized to measure appropriate catheter length. A stiff Glidewire was advanced to the level of the IVC and the micropuncture sheath was exchanged for a peel-away sheath. A palindrome tunneled hemodialysis catheter measuring 19 cm from tip to cuff was tunneled in a retrograde fashion from the anterior chest wall to the venotomy incision. The catheter was then placed through the peel-away sheath with tips ultimately positioned within the superior aspect of the right atrium. Final catheter positioning was confirmed and documented with a spot radiographic image. The catheter aspirates and flushes normally. The catheter was flushed with appropriate volume heparin dwells. The catheter exit site was secured with a 0-Prolene retention suture. The venotomy incision was closed with an interrupted 4-0 Vicryl, Dermabond and Steri-strips. Dressings were applied. The patient tolerated the procedure well without immediate post procedural complication. IMPRESSION: Successful placement of 19 cm tip to cuff tunneled hemodialysis catheter via the right internal jugular vein with tips terminating within the superior aspect of the right atrium. The catheter is ready for immediate use.  Electronically Signed   By: Sandi Mariscal M.D.   On: 05/17/2018 10:09    Scheduled Meds: . amLODipine  10 mg Oral Daily  . aspirin  81 mg Oral Daily  . azithromycin  1,200 mg Oral Weekly  . calcium acetate  667 mg Oral TID WC  . carvedilol  25 mg Oral BID WC  . Chlorhexidine Gluconate Cloth  6 each Topical Q0600  . dapsone  100 mg Oral Daily  . darunavir-cobicistat  1 tablet Oral Q breakfast  . dolutegravir  50 mg Oral Q breakfast  . furosemide  40 mg Intravenous BID  . isosorbide-hydrALAZINE  1 tablet Oral TID  . rilpivirine  25 mg Oral Q breakfast  . senna-docusate  1 tablet Oral Daily  . sodium bicarbonate  1,300 mg Oral BID   Continuous Infusions: . sodium chloride Stopped (05/12/18 1526)     LOS: 8 days    Roxan Hockey, MD [facility]    If 7PM-7AM, please contact night-coverage [email or web address] Password [facility] 05/18/2018, 5:31 PM

## 2018-05-18 NOTE — Progress Notes (Signed)
Bilateral upper extremity vein mapping has been completed.    05/18/18 1:52 PM Carlos Levering RVT

## 2018-05-18 NOTE — Telephone Encounter (Signed)
Left message for patient to call and schedule 2-4 week post hospital visit with Dr. Debara Pickett or APP

## 2018-05-18 NOTE — Progress Notes (Signed)
S: seen on HD and tolerating it well O:BP 128/82   Pulse 65   Temp 97.9 F (36.6 C) (Oral)   Resp 12   Ht _0  (1.651 m)   Wt 82.8 kg (182 lb 8.7 oz)   SpO2 95%   BMI 30.38 kg/m   Intake/Output Summary (Last 24 hours) at 05/18/2018 0847 Last data filed at 05/18/2018 0222 Gross per 24 hour  Intake 240 ml  Output 551 ml  Net -311 ml   Intake/Output: I/O last 3 completed shifts: In: 240 [P.O.:240] Out: 851 [Urine:850; Stool:1]  Intake/Output this shift:  No intake/output data recorded. Weight change:  Gen: NAD CVS: no rub Resp: cta Abd: benign Ext: 1+ edema  Recent Labs  Lab 05/11/18 1521 05/12/18 0902 05/13/18 0241 05/14/18 0253 05/15/18 0222 05/16/18 0236 05/17/18 0206 05/18/18 0317  NA 141 140 143  143 142 142  141 141 140 139  K 3.9 3.4* 3.6  3.6 3.4* 3.7  3.7 3.6 3.6 4.3  CL 117* 115* 116*  116* 114* 114*  114* 113* 111 108  CO2 13* 15* 16*  17* 17* 18*  18* 19* 19* 21*  GLUCOSE 137* 109* 95  95 93 109*  110* 107* 103* 108*  BUN 71* 66* 65*  66* 62* 60*  59* 56* 57* 59*  CREATININE 5.92* 6.11* 7.10*  7.10* 7.36* 7.85*  7.80* 8.04* 8.15* 8.16*  ALBUMIN 1.8*  --  1.8*  1.8* 1.9* 2.0* 1.9*  --   --   CALCIUM 7.5* 7.8* 7.5*  7.6*  7.3* 7.8* 7.7*  7.7* 7.8* 7.9* 8.4*  PHOS 7.2*  --  6.4* 6.4* 6.8* 6.5*  --   --   AST  --   --  21  --   --   --   --   --   ALT  --   --  13  --   --   --   --   --    Liver Function Tests: Recent Labs  Lab 05/13/18 0241 05/14/18 0253 05/15/18 0222 05/16/18 0236  AST 21  --   --   --   ALT 13  --   --   --   ALKPHOS 40  --   --   --   BILITOT 0.5  --   --   --   PROT 5.7*  --   --   --   ALBUMIN 1.8*  1.8* 1.9* 2.0* 1.9*   Recent Labs  Lab 05/13/18 0241  LIPASE 93*   No results for input(s): AMMONIA in the last 168 hours. CBC: Recent Labs  Lab 05/14/18 0253 05/15/18 0222 05/16/18 0236 05/17/18 0206 05/18/18 0800  WBC 7.4 6.0 7.4 7.4 8.3  HGB 9.0* 8.2* 8.2* 8.5* 8.8*  HCT 27.9* 25.3* 25.6*  26.5* 28.5*  MCV 86.4 86.6 86.2 86.6 88.2  PLT 266 252 268 283 321   Cardiac Enzymes: No results for input(s): CKTOTAL, CKMB, CKMBINDEX, TROPONINI in the last 168 hours. CBG: No results for input(s): GLUCAP in the last 168 hours.  Iron Studies: No results for input(s): IRON, TIBC, TRANSFERRIN, FERRITIN in the last 72 hours. Studies/Results: Ir Fluoro Guide Cv Line Right  Result Date: 05/17/2018 INDICATION: End-stage renal disease. In need of durable intravenous access for the initiation of dialysis. EXAM: TUNNELED CENTRAL VENOUS HEMODIALYSIS CATHETER PLACEMENT WITH ULTRASOUND AND FLUOROSCOPIC GUIDANCE MEDICATIONS: Vancomycin 1 gm IV . The antibiotic was given in an appropriate time interval prior to skin puncture. ANESTHESIA/SEDATION:  Versed 2 mg IV; Fentanyl 50 mcg IV; Moderate Sedation Time:  17 minutes The patient was continuously monitored during the procedure by the interventional radiology nurse under my direct supervision. FLUOROSCOPY TIME:  Fluoroscopy Time: 24 seconds (5 mGy). COMPLICATIONS: None immediate. PROCEDURE: Informed written consent was obtained from the patient after a discussion of the risks, benefits, and alternatives to treatment. Questions regarding the procedure were encouraged and answered. The right neck and chest were prepped with chlorhexidine in a sterile fashion, and a sterile drape was applied covering the operative field. Maximum barrier sterile technique with sterile gowns and gloves were used for the procedure. A timeout was performed prior to the initiation of the procedure. After creating a small venotomy incision, a micropuncture kit was utilized to access the internal jugular vein. Real-time ultrasound guidance was utilized for vascular access including the acquisition of a permanent ultrasound image documenting patency of the accessed vessel. The microwire was utilized to measure appropriate catheter length. A stiff Glidewire was advanced to the level of the IVC  and the micropuncture sheath was exchanged for a peel-away sheath. A palindrome tunneled hemodialysis catheter measuring 19 cm from tip to cuff was tunneled in a retrograde fashion from the anterior chest wall to the venotomy incision. The catheter was then placed through the peel-away sheath with tips ultimately positioned within the superior aspect of the right atrium. Final catheter positioning was confirmed and documented with a spot radiographic image. The catheter aspirates and flushes normally. The catheter was flushed with appropriate volume heparin dwells. The catheter exit site was secured with a 0-Prolene retention suture. The venotomy incision was closed with an interrupted 4-0 Vicryl, Dermabond and Steri-strips. Dressings were applied. The patient tolerated the procedure well without immediate post procedural complication. IMPRESSION: Successful placement of 19 cm tip to cuff tunneled hemodialysis catheter via the right internal jugular vein with tips terminating within the superior aspect of the right atrium. The catheter is ready for immediate use. Electronically Signed   By: Sandi Mariscal M.D.   On: 05/17/2018 10:09   Ir US Guide Vasc Access Right  Result Date: 05/17/2018 INDICATION: End-stage renal disease. In need of durable intravenous access for the initiation of dialysis. EXAM: TUNNELED CENTRAL VENOUS HEMODIALYSIS CATHETER PLACEMENT WITH ULTRASOUND AND FLUOROSCOPIC GUIDANCE MEDICATIONS: Vancomycin 1 gm IV . The antibiotic was given in an appropriate time interval prior to skin puncture. ANESTHESIA/SEDATION: Versed 2 mg IV; Fentanyl 50 mcg IV; Moderate Sedation Time:  17 minutes The patient was continuously monitored during the procedure by the interventional radiology nurse under my direct supervision. FLUOROSCOPY TIME:  Fluoroscopy Time: 24 seconds (5 mGy). COMPLICATIONS: None immediate. PROCEDURE: Informed written consent was obtained from the patient after a discussion of the risks,  benefits, and alternatives to treatment. Questions regarding the procedure were encouraged and answered. The right neck and chest were prepped with chlorhexidine in a sterile fashion, and a sterile drape was applied covering the operative field. Maximum barrier sterile technique with sterile gowns and gloves were used for the procedure. A timeout was performed prior to the initiation of the procedure. After creating a small venotomy incision, a micropuncture kit was utilized to access the internal jugular vein. Real-time ultrasound guidance was utilized for vascular access including the acquisition of a permanent ultrasound image documenting patency of the accessed vessel. The microwire was utilized to measure appropriate catheter length. A stiff Glidewire was advanced to the level of the IVC and the micropuncture sheath was exchanged for a peel-away sheath.  A palindrome tunneled hemodialysis catheter measuring 19 cm from tip to cuff was tunneled in a retrograde fashion from the anterior chest wall to the venotomy incision. The catheter was then placed through the peel-away sheath with tips ultimately positioned within the superior aspect of the right atrium. Final catheter positioning was confirmed and documented with a spot radiographic image. The catheter aspirates and flushes normally. The catheter was flushed with appropriate volume heparin dwells. The catheter exit site was secured with a 0-Prolene retention suture. The venotomy incision was closed with an interrupted 4-0 Vicryl, Dermabond and Steri-strips. Dressings were applied. The patient tolerated the procedure well without immediate post procedural complication. IMPRESSION: Successful placement of 19 cm tip to cuff tunneled hemodialysis catheter via the right internal jugular vein with tips terminating within the superior aspect of the right atrium. The catheter is ready for immediate use. Electronically Signed   By: Sandi Mariscal M.D.   On: 05/17/2018  10:09   . amLODipine  10 mg Oral Daily  . aspirin  81 mg Oral Daily  . azithromycin  1,200 mg Oral Weekly  . calcium acetate  667 mg Oral TID WC  . carvedilol  25 mg Oral BID WC  . Chlorhexidine Gluconate Cloth  6 each Topical Q0600  . dapsone  100 mg Oral Daily  . darunavir-cobicistat  1 tablet Oral Q breakfast  . dolutegravir  50 mg Oral Q breakfast  . furosemide  40 mg Intravenous BID  . isosorbide-hydrALAZINE  1 tablet Oral TID  . rilpivirine  25 mg Oral Q breakfast  . senna-docusate  1 tablet Oral Daily  . sodium bicarbonate  1,300 mg Oral BID    BMET    Component Value Date/Time   NA 139 05/18/2018 0317   K 4.3 05/18/2018 0317   CL 108 05/18/2018 0317   CO2 21 (L) 05/18/2018 0317   GLUCOSE 108 (H) 05/18/2018 0317   BUN 59 (H) 05/18/2018 0317   CREATININE 8.16 (H) 05/18/2018 0317   CREATININE 0.67 (L) 04/23/2017 1728   CALCIUM 8.4 (L) 05/18/2018 0317   CALCIUM 7.3 (L) 05/13/2018 0241   GFRNONAA 7 (L) 05/18/2018 0317   GFRNONAA >89 09/14/2014 0947   GFRAA 8 (L) 05/18/2018 0317   GFRAA >89 09/14/2014 0947   CBC    Component Value Date/Time   WBC 8.3 05/18/2018 0800   RBC 3.23 (L) 05/18/2018 0800   HGB 8.8 (L) 05/18/2018 0800   HGB 8.9 (L) 05/12/2018 1038   HGB 13.7 11/20/2008 1531   HCT 28.5 (L) 05/18/2018 0800   HCT 40.5 11/20/2008 1531   PLT 321 05/18/2018 0800   PLT 172 11/20/2008 1531   MCV 88.2 05/18/2018 0800   MCV 90.7 11/20/2008 1531   MCH 27.2 05/18/2018 0800   MCHC 30.9 05/18/2018 0800   RDW 16.9 (H) 05/18/2018 0800   RDW 15.0 (H) 11/20/2008 1531   LYMPHSABS 1,054 04/23/2017 1728   LYMPHSABS 1.5 11/20/2008 1531   MONOABS 465 04/23/2017 1728   MONOABS 0.3 11/20/2008 1531   EOSABS 93 04/23/2017 1728   EOSABS 0.1 11/20/2008 1531   BASOSABS 31 04/23/2017 1728   BASOSABS 0.0 11/20/2008 1531     Assessment/Plan:  1. AKI/progressive CKD due to poorly controlled HTN and HIV.  Creatinine has continued to climb and [place] consistent with chronic  medical renal disease.  He is s/p [facility] placement today and if scheduled for his first HD session today.  He will need longterm access placement and will consult VVS.  He may regain some renal function with treatment of his HIV and HTN but for now will require HD. 2. HIV- poorly controlled.  Started on Tivicay, prezcobix, and RPV per ID. 3. Dilated CMP- UF with HD and cont with carvedilol and bidil per Cardiology 4. Uncontrolled HTN- improved with meds 5. Anemia of chronic disease- will start esa with HD and check iron stores 6. Severe protein malnutrition- consider protein supplements 7. Disposition- will be difficult if he lives in [facility] and has not had any nephrology care there compounded by his history on nonadherence with medications and follow up.   Donetta Potts, MD [employer] [phone]

## 2018-05-19 LAB — CBC
HEMATOCRIT: 27.5 % — AB (ref 39.0–52.0)
Hemoglobin: 8.6 g/dL — ABNORMAL LOW (ref 13.0–17.0)
MCH: 27.7 pg (ref 26.0–34.0)
MCHC: 31.3 g/dL (ref 30.0–36.0)
MCV: 88.4 fL (ref 78.0–100.0)
PLATELETS: 281 10*3/uL (ref 150–400)
RBC: 3.11 MIL/uL — ABNORMAL LOW (ref 4.22–5.81)
RDW: 16.7 % — AB (ref 11.5–15.5)
WBC: 7.9 10*3/uL (ref 4.0–10.5)

## 2018-05-19 LAB — RENAL FUNCTION PANEL
Albumin: 1.8 g/dL — ABNORMAL LOW (ref 3.5–5.0)
Anion gap: 11 (ref 5–15)
BUN: 53 mg/dL — AB (ref 6–20)
CHLORIDE: 105 mmol/L (ref 98–111)
CO2: 23 mmol/L (ref 22–32)
CREATININE: 7.34 mg/dL — AB (ref 0.61–1.24)
Calcium: 8.2 mg/dL — ABNORMAL LOW (ref 8.9–10.3)
GFR calc Af Amer: 9 mL/min — ABNORMAL LOW (ref >60)
GFR calc non Af Amer: 7 mL/min — ABNORMAL LOW (ref >60)
Glucose, Bld: 98 mg/dL (ref 70–99)
POTASSIUM: 3.7 mmol/L (ref 3.5–5.1)
Phosphorus: 6.1 mg/dL — ABNORMAL HIGH (ref 2.5–4.6)
Sodium: 139 mmol/L (ref 135–145)

## 2018-05-19 LAB — HEPATITIS B SURFACE ANTIBODY,QUALITATIVE: HEP B S AB: NONREACTIVE

## 2018-05-19 LAB — MAGNESIUM: MAGNESIUM: 1.7 mg/dL (ref 1.7–2.4)

## 2018-05-19 MED ORDER — SODIUM CHLORIDE 0.9 % IV SOLN: 100.0000 mL | INTRAVENOUS | Status: DC | PRN

## 2018-05-19 MED ORDER — LIDOCAINE HCL (PF) 1 % IJ SOLN: 5.0000 mL | INTRAMUSCULAR | Status: DC | PRN

## 2018-05-19 MED ORDER — PENTAFLUOROPROP-TETRAFLUOROETH EX AERO: 1.0000 "application " | INHALATION_SPRAY | CUTANEOUS | Status: DC | PRN

## 2018-05-19 MED ORDER — ALTEPLASE 2 MG IJ SOLR: 2.0000 mg | Freq: Once | INTRAMUSCULAR | Status: DC | PRN

## 2018-05-19 MED ORDER — HEPARIN SODIUM (PORCINE) 1000 UNIT/ML DIALYSIS
1000.0000 [IU] | INTRAMUSCULAR | Status: DC | PRN
  Filled 2018-05-19: qty 1

## 2018-05-19 MED ORDER — LIDOCAINE-PRILOCAINE 2.5-2.5 % EX CREA
1.0000 "application " | TOPICAL_CREAM | CUTANEOUS | Status: DC | PRN
  Filled 2018-05-19: qty 5

## 2018-05-19 NOTE — Progress Notes (Signed)
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 [name] on 05/10/2018 w/ right-sided chest pain with radiation to the right arm, nausea, and SOB after walking a short distance.  He also reported B LE swelling, paroxysmal nocturnal dyspnea, and bright red blood per rectum for a weeks duration.  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 Blake Divine ,   Rt Neck/Chest tunneled HD catheter placed on 05/17/18 (initiated on HD on 05/18/18), tolerated HD session on 05/19/18,   significant concerns about patient's ability to be compliant with hemodialysis  , will need outpatient HD arranged for him in the [facility] area where he plans to return home  - Avoid nephrotoxic medication Recent Labs  Lab 05/13/18 0241 05/14/18 0253 05/15/18 0222 05/16/18 0236 05/17/18 0206 05/18/18 0317 05/19/18 1100    CREATININE 7.10*  7.10* 7.36* 7.85*  7.80* 8.04* 8.15* 8.16* 7.34*   2)HIV/AIDS- - Noncompliant with medication.- - HIV medications ( Dr Tommy Medal changed to a more renal based regimen of TIVICAY PREZCOBIX and RPV)  per ID,   CD4 count was 50,   ID consult appreciated,  HLA B701 ordered,  -Azithromycin 1200 mg weekly, dapsone weekly,  , -Prezcobix 800-150 mg daily, -Tivicay 50 mg dailym, -Edurant 25 mg daily  Infectious disease service is signing off as of 05/16/2018   3)Dilated cardiomyopathy with acute systolic and diastolic CHF/chest pain.-Overall stable at this time, appears compensated, continue to  use hemodialysis to address volume status, -Daily weight, - Transfuse for hemoglobin<8, -Cardiology consult appreciated, Myoview stress test on 05/14/2017 is abnormal, awaiting further cardiology input, , c/n Amlodipine 10 mg daily, BiDil,- Lasix  , C/n coreg 25 mg twice daily, lasix -  ,  aspirin. Hold off on statin due to HAART and advanced renal disease.  Cardiology service is signing off as of 05/17/2018  4)Uncontrolled essential HTN- - improved BP control, multifactorial, but mostly due to noncompliance with medication, - c/n Norvasc, Coreg, Lasix, BiDil   5)GI bleed/BRBPR- -  Currently patient refuses colonoscopy., -GI input appreciated,  GI signed off as of 05/16/2018  6)Normocytic anemia- -Anemia panel consistent with anemia of chronic disease.  Suspect a combination of AIDS +Renal disease does not appear to have significant GI bleed. ESA/Procrit as per nephrology team  7)Chronic boil of buttocks- -Hx of chronic "boil"  of buttock, -I&D in ED.  Culture not sent   8)Polysubstance abuse-- 6/25 UDS positive marijuana  9)Goal of care -6/26 PALLIATIVE CARE: Noncompliant patient with HIV, acute systolic CHF, acute renal failure possibly going on HD discussed change of status to DNR, discussed short-term vs long-term goals of care  10)CAD--Myoview stress test from 05/14/2018 with reversible  ischemia along the septal wall, cardiologist recommends medical management, concerns about early complications if left heart catheterization is done , also should angioplasty and stenting be done patient would not likely be compliant with dual antiplatelet therapy.  Cardiology service is signing off as of 05/17/2018  11)Disposition--- patient will need outpatient hemodialysis chair/spot assigned prior to discharge home , significant concerns about patient's ability to be compliant with hemodialysis  , will need outpatient HD arranged for him in the [facility] area where he plans to return home   DVT prophylaxis: SCD Code Status: Full Family Communication: None Disposition Plan:  Once HIV Meds can be obtained and if ok with Nephrology  Consultants:  Cardiology ID Nephrology GI Palliative IR Rt Neck/Chest tunneled HD catheter placed on 05/17/18,  Procedures/Significant Events:  6/25 echocardiogram:Left ventricle: LVEF =45% to 50%. Diffuse hypokinesis.- Grade 1   diastolic dysfunction. - Left atrium:  moderately dilated.  05/14/2018 abnormal Myoview stress test  Rt Neck/Chest tunneled HD catheter placed on 05/17/18,  Cultures 6/24 MRSA PCR negative  Antimicrobials: Anti-infectives (From admission, onward)   Start     Stop   05/12/18 0730  darunavir-cobicistat (PREZCOBIX) 800-150 MG per tablet 1 tablet         05/12/18 0730  dolutegravir (TIVICAY) tablet 50 mg         05/12/18 0730  rilpivirine (EDURANT) tablet 25 mg         05/12/18 0700  dolutegravir (TIVICAY) tablet 50 mg  Status:  Discontinued     05/12/18 0047   05/12/18 0700  rilpivirine (EDURANT) tablet 25 mg  Status:  Discontinued     05/12/18 0047   05/12/18 0700  darunavir-cobicistat (PREZCOBIX) 800-150 MG per tablet 1 tablet  Status:  Discontinued     05/12/18 0047   05/11/18 1015  azithromycin (ZITHROMAX) tablet 1,200 mg         05/11/18 1015  sulfamethoxazole-trimethoprim (BACTRIM,SEPTRA) 400-80 MG per tablet 1  tablet  Status:  Discontinued     05/11/18 1228   05/11/18 0800  elvitegravir-cobicistat-emtricitabine-tenofovir (GENVOYA) 150-150-200-10 MG tablet 1 tablet  Status:  Discontinued     05/10/18 1931   05/11/18 0800  darunavir (PREZISTA) tablet 800 mg  Status:  Discontinued     05/10/18 1931       Continuous Infusions: . sodium chloride Stopped (05/12/18 1526)  . sodium chloride    . sodium chloride       Objective: Vitals:   05/19/18 1200 05/19/18 1230 05/19/18 1300 05/19/18 1330  BP: 120/76 117/74 126/81 130/80  Pulse: 66 64 67 71  Resp:    18  Temp:    98.6 F (37 C)  TempSrc:    Oral  SpO2:    98%  Weight:    78.8 kg (173 lb 11.6 oz)  Height:        Intake/Output Summary (Last 24 hours) at 05/19/2018 1633 Last data filed at 05/19/2018 1330 Gross per 24 hour  Intake 720 ml  Output 2200 ml  Net -1480 ml   Filed Weights   05/19/18 0500 05/19/18 1000 05/19/18 1330  Weight: 84.3 kg (185 lb 13.6 oz) 83.6  kg (184 lb 4.9 oz) 78.8 kg (173 lb 11.6 oz)    Examination:  Physical Exam  Gen:- Awake Alert, in no apparent distress  HEENT:- [place].AT, No sclera icterus Neck-Supple Neck,No JVD, Rt Neck/Chest tunneled HD catheter placed on 05/17/18,  Lungs-   no wheezing, fair air movement CV- S1, S2 normal Abd-  +ve B.Sounds, Abd Soft, No tenderness,    Extremity-1 + pitting edema,    Psych-affect is appropriate, oriented x3 Neuro-no new focal deficits, no tremors   CBC: Recent Labs  Lab 05/15/18 0222 05/16/18 0236 05/17/18 0206 05/18/18 0800 05/19/18 1107  WBC 6.0 7.4 7.4 8.3 7.9  HGB 8.2* 8.2* 8.5* 8.8* 8.6*  HCT 25.3* 25.6* 26.5* 28.5* 27.5*  MCV 86.6 86.2 86.6 88.2 88.4  PLT 252 268 283 321 409   Basic Metabolic Panel: Recent Labs  Lab 05/13/18 0241 05/14/18 0253 05/15/18 0222 05/16/18 0236 05/17/18 0206 05/18/18 0317 05/19/18 0332 05/19/18 1100  NA 143  143 142 142  141 141 140 139  --  139  K 3.6  3.6 3.4* 3.7  3.7 3.6 3.6 4.3  --  3.7  CL 116*   116* 114* 114*  114* 113* 111 108  --  105  CO2 16*  17* 17* 18*  18* 19* 19* 21*  --  23  GLUCOSE 95  95 93 109*  110* 107* 103* 108*  --  98  BUN 65*  66* 62* 60*  59* 56* 57* 59*  --  53*  CREATININE 7.10*  7.10* 7.36* 7.85*  7.80* 8.04* 8.15* 8.16*  --  7.34*  CALCIUM 7.5*  7.6*  7.3* 7.8* 7.7*  7.7* 7.8* 7.9* 8.4*  --  8.2*  MG 1.8 1.6* 2.1 1.9 2.0 1.9 1.7  --   PHOS 6.4* 6.4* 6.8* 6.5*  --   --   --  6.1*   GFR: Estimated Creatinine Clearance: 10.9 mL/min (A) (by C-G formula based on SCr of 7.34 mg/dL (H)). Liver Function Tests: Recent Labs  Lab 05/13/18 0241 05/14/18 0253 05/15/18 0222 05/16/18 0236 05/19/18 1100  AST 21  --   --   --   --   ALT 13  --   --   --   --   ALKPHOS 40  --   --   --   --   BILITOT 0.5  --   --   --   --   PROT 5.7*  --   --   --   --   ALBUMIN 1.8*  1.8* 1.9* 2.0* 1.9* 1.8*   Recent Labs  Lab 05/13/18 0241  LIPASE 93*   No results for input(s): AMMONIA in the last 168 hours. Coagulation Profile: Recent Labs  Lab 05/17/18 0206  INR 1.04   Cardiac Enzymes: No results for input(s): CKTOTAL, CKMB, CKMBINDEX, TROPONINI in the last 168 hours. BNP (last 3 results) No results for input(s): PROBNP in the last 8760 hours. HbA1C: No results for input(s): HGBA1C in the last 72 hours. CBG: No results for input(s): GLUCAP in the last 168 hours. Lipid Profile: No results for input(s): CHOL, HDL, LDLCALC, TRIG, CHOLHDL, LDLDIRECT in the last 72 hours. Thyroid Function Tests: No results for input(s): TSH, T4TOTAL, FREET4, T3FREE, THYROIDAB in the last 72 hours. Anemia Panel: No results for input(s): VITAMINB12, FOLATE, FERRITIN, TIBC, IRON, RETICCTPCT in the last 72 hours. Urine analysis:    Component Value Date/Time   COLORURINE YELLOW 05/11/2018 0606   APPEARANCEUR HAZY (A) 05/11/2018 8119  LABSPEC 1.012 05/11/2018 0606   PHURINE 5.0 05/11/2018 0606   GLUCOSEU NEGATIVE 05/11/2018 0606   GLUCOSEU NEG mg/dL 11/23/2006 2036    HGBUR MODERATE (A) 05/11/2018 0606   BILIRUBINUR NEGATIVE 05/11/2018 0606   KETONESUR NEGATIVE 05/11/2018 0606   PROTEINUR >=300 (A) 05/11/2018 0606   UROBILINOGEN 0.2 11/23/2006 2036   NITRITE NEGATIVE 05/11/2018 0606   LEUKOCYTESUR NEGATIVE 05/11/2018 0606   Sepsis Labs: '@LABRCNTIP' (procalcitonin:4,lacticidven:4)  ) Recent Results (from the past 240 hour(s))  MRSA PCR Screening     Status: None   Collection Time: 05/10/18 11:00 PM  Result Value Ref Range Status   MRSA by PCR NEGATIVE NEGATIVE Final    Comment:        The GeneXpert MRSA Assay (FDA approved for NASAL specimens only), is one component of a comprehensive MRSA colonization surveillance program. It is not intended to diagnose MRSA infection nor to guide or monitor treatment for MRSA infections. Performed at [facility], [place] [address], [place], [place] [address]     Radiology Studies: No results found.  Scheduled Meds: . amLODipine  10 mg Oral Daily  . aspirin  81 mg Oral Daily  . azithromycin  1,200 mg Oral Weekly  . calcium acetate  667 mg Oral TID WC  . carvedilol  25 mg Oral BID WC  . Chlorhexidine Gluconate Cloth  6 each Topical Q0600  . dapsone  100 mg Oral Daily  . darunavir-cobicistat  1 tablet Oral Q breakfast  . dolutegravir  50 mg Oral Q breakfast  . furosemide  40 mg Intravenous BID  . isosorbide-hydrALAZINE  1 tablet Oral TID  . rilpivirine  25 mg Oral Q breakfast  . senna-docusate  1 tablet Oral Daily  . sodium bicarbonate  1,300 mg Oral BID   Continuous Infusions: . sodium chloride Stopped (05/12/18 1526)  . sodium chloride    . sodium chloride       LOS: 9 days    Roxan Hockey, MD [facility]    If 7PM-7AM, please contact night-coverage [email or web address] Password [facility] 05/19/2018, 4:33 PM

## 2018-05-19 NOTE — Telephone Encounter (Signed)
Left message for patient to call and schedule post hospital visit.

## 2018-05-19 NOTE — Procedures (Signed)
I was present at this dialysis session. I have reviewed the session itself and made appropriate changes.   Filed Weights   05/18/18 0957 05/19/18 0500 05/19/18 1000  Weight: 81.1 kg (178 lb 12.7 oz) 84.3 kg (185 lb 13.6 oz) 83.6 kg (184 lb 4.9 oz)    Recent Labs  Lab 05/16/18 0236  05/18/18 0317  NA 141   < > 139  K 3.6   < > 4.3  CL 113*   < > 108  CO2 19*   < > 21*  GLUCOSE 107*   < > 108*  BUN 56*   < > 59*  CREATININE 8.04*   < > 8.16*  CALCIUM 7.8*   < > 8.4*  PHOS 6.5*  --   --    < > = values in this interval not displayed.    Recent Labs  Lab 05/16/18 0236 05/17/18 0206 05/18/18 0800  WBC 7.4 7.4 8.3  HGB 8.2* 8.5* 8.8*  HCT 25.6* 26.5* 28.5*  MCV 86.2 86.6 88.2  PLT 268 283 321    Scheduled Meds: . amLODipine  10 mg Oral Daily  . aspirin  81 mg Oral Daily  . azithromycin  1,200 mg Oral Weekly  . calcium acetate  667 mg Oral TID WC  . carvedilol  25 mg Oral BID WC  . Chlorhexidine Gluconate Cloth  6 each Topical Q0600  . dapsone  100 mg Oral Daily  . darunavir-cobicistat  1 tablet Oral Q breakfast  . dolutegravir  50 mg Oral Q breakfast  . furosemide  40 mg Intravenous BID  . isosorbide-hydrALAZINE  1 tablet Oral TID  . rilpivirine  25 mg Oral Q breakfast  . senna-docusate  1 tablet Oral Daily  . sodium bicarbonate  1,300 mg Oral BID   Continuous Infusions: . sodium chloride Stopped (05/12/18 1526)  . sodium chloride    . sodium chloride     PRN Meds:.sodium chloride, sodium chloride, sodium chloride, acetaminophen **OR** [DISCONTINUED] acetaminophen, alteplase, bisacodyl, heparin, HYDROcodone-acetaminophen, labetalol, lidocaine (PF), lidocaine-prilocaine, ondansetron **OR** ondansetron (ZOFRAN) IV, pentafluoroprop-tetrafluoroeth, senna-docusate, traMADol     Assessment/Plan:  1. AKI/progressive CKD due to poorly controlled HTN and HIV. Creatinine has continued to climb and [place] consistent with chronic medical renal disease. He is s/p [facility]  placement today and if scheduled for his first HD session today. He will need longterm access placement and I have consulted [facility]. He may regain some renal function with treatment of his HIV and HTN but for now will require HD. 2. HIV- poorly controlled. Started on Tivicay, prezcobix, and RPV per ID. 3. Dilated CMP- UF with HD and cont with carvedilol and bidil per Cardiology 4. Uncontrolled HTN- improved with meds 5. Anemia of chronic disease- will start esa with HD and check iron stores 6. Severe protein malnutrition- consider protein supplements 7. Disposition- will be difficult if he lives in [facility] and has not had any nephrology care there compounded by his history on nonadherence with medications and follow up.   Donetta Potts,  MD 05/19/2018, 10:30 AM

## 2018-05-20 LAB — BASIC METABOLIC PANEL
Anion gap: 9 (ref 5–15)
BUN: 42 mg/dL — ABNORMAL HIGH (ref 6–20)
CO2: 27 mmol/L (ref 22–32)
Calcium: 8.1 mg/dL — ABNORMAL LOW (ref 8.9–10.3)
Chloride: 103 mmol/L (ref 98–111)
Creatinine, Ser: 6.55 mg/dL — ABNORMAL HIGH (ref 0.61–1.24)
GFR calc non Af Amer: 8 mL/min — ABNORMAL LOW (ref >60)
GFR, EST AFRICAN AMERICAN: 10 mL/min — AB (ref >60)
Glucose, Bld: 95 mg/dL (ref 70–99)
POTASSIUM: 3.8 mmol/L (ref 3.5–5.1)
SODIUM: 139 mmol/L (ref 135–145)

## 2018-05-20 LAB — CBC
HCT: 24.6 % — ABNORMAL LOW (ref 39.0–52.0)
HEMOGLOBIN: 7.7 g/dL — AB (ref 13.0–17.0)
MCH: 27.9 pg (ref 26.0–34.0)
MCHC: 31.3 g/dL (ref 30.0–36.0)
MCV: 89.1 fL (ref 78.0–100.0)
Platelets: 211 10*3/uL (ref 150–400)
RBC: 2.76 MIL/uL — AB (ref 4.22–5.81)
RDW: 16.3 % — ABNORMAL HIGH (ref 11.5–15.5)
WBC: 8 10*3/uL (ref 4.0–10.5)

## 2018-05-20 LAB — MAGNESIUM: Magnesium: 1.8 mg/dL (ref 1.7–2.4)

## 2018-05-20 MED ORDER — HYDROCODONE-ACETAMINOPHEN 5-325 MG PO TABS
ORAL_TABLET | ORAL | Status: AC
  Filled 2018-05-20: qty 1

## 2018-05-20 NOTE — Procedures (Signed)
I was present at this dialysis session. I have reviewed the session itself and made appropriate changes.   Filed Weights   05/19/18 1000 05/19/18 1330 05/20/18 0452  Weight: 83.6 kg (184 lb 4.9 oz) 78.8 kg (173 lb 11.6 oz) 79.3 kg (174 lb 13.2 oz)    Recent Labs  Lab 05/19/18 1100 05/20/18 0156  NA 139 139  K 3.7 3.8  CL 105 103  CO2 23 27  GLUCOSE 98 95  BUN 53* 42*  CREATININE 7.34* 6.55*  CALCIUM 8.2* 8.1*  PHOS 6.1*  --     Recent Labs  Lab 05/18/18 0800 05/19/18 1107 05/20/18 0156  WBC 8.3 7.9 8.0  HGB 8.8* 8.6* 7.7*  HCT 28.5* 27.5* 24.6*  MCV 88.2 88.4 89.1  PLT 321 281 211    Scheduled Meds: . amLODipine  10 mg Oral Daily  . aspirin  81 mg Oral Daily  . azithromycin  1,200 mg Oral Weekly  . calcium acetate  667 mg Oral TID WC  . carvedilol  25 mg Oral BID WC  . Chlorhexidine Gluconate Cloth  6 each Topical Q0600  . dapsone  100 mg Oral Daily  . darunavir-cobicistat  1 tablet Oral Q breakfast  . dolutegravir  50 mg Oral Q breakfast  . furosemide  40 mg Intravenous BID  . isosorbide-hydrALAZINE  1 tablet Oral TID  . rilpivirine  25 mg Oral Q breakfast  . senna-docusate  1 tablet Oral Daily  . sodium bicarbonate  1,300 mg Oral BID   Continuous Infusions: . sodium chloride Stopped (05/12/18 1526)  . sodium chloride    . sodium chloride     PRN Meds:.sodium chloride, sodium chloride, sodium chloride, acetaminophen **OR** [DISCONTINUED] acetaminophen, alteplase, bisacodyl, heparin, HYDROcodone-acetaminophen, labetalol, lidocaine (PF), lidocaine-prilocaine, ondansetron **OR** ondansetron (ZOFRAN) IV, pentafluoroprop-tetrafluoroeth, senna-docusate, traMADol    Assessment/Plan:  1. AKI/progressive CKD due to poorly controlled HTN and HIV. Creatinine continued to climb and [place] consistent with chronic medical renal disease. He started on HD 05/18/18 s/p [facility] placement and is currently receiving his 3rd treatment. He will need longterm access placement and I  have consulted [facility]. He may regain some renal function with treatment of his HIV and HTN but for now will require HD. 2. HIV- poorly controlled. Started on Tivicay, prezcobix, and RPV per ID. 3. Dilated CMP- UF with HD and cont with carvedilol and bidil per Cardiology 4. Uncontrolled HTN- improved with meds 5. Anemia of chronic disease- will start esa with HD and check iron stores 6. Severe protein malnutrition- consider protein supplements 7. Disposition- will be difficult if he lives in [facility] and has not had any nephrology care there compounded by his history on nonadherence with medications and follow up.   Donetta Potts,  MD 05/20/2018, 7:59 AM

## 2018-05-20 NOTE — Progress Notes (Signed)
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 [name] on 05/10/2018 w/ right-sided chest pain with radiation to the right arm, nausea, and SOB after walking a short distance.  He also reported B LE swelling, paroxysmal nocturnal dyspnea, and bright red blood per rectum for a weeks duration.  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 Blake Divine ,   Rt Neck/Chest tunneled HD catheter placed on 05/17/18 (initiated on HD on 05/18/18), tolerated HD session on 05/20/18,   significant concerns about patient's ability to be compliant with hemodialysis  , will need outpatient HD arranged for him in the [facility] area where he plans to return home  - Avoid nephrotoxic medication Recent Labs  Lab 05/14/18 0253 05/15/18 0222 05/16/18 0236 05/17/18 0206 05/18/18 0317 05/19/18 1100  05/20/18 0156  CREATININE 7.36* 7.85*  7.80* 8.04* 8.15* 8.16* 7.34* 6.55*   2)HIV/AIDS- - Noncompliant with medication.- - HIV medications ( Dr Tommy Medal changed to a more renal based regimen of TIVICAY PREZCOBIX and RPV)  per ID,   CD4 count was 50,   ID consult appreciated,  HLA B701 ordered,  -Azithromycin 1200 mg weekly, dapsone weekly,  , -Prezcobix 800-150 mg daily, -Tivicay 50 mg dailym, -Edurant 25 mg daily  Infectious disease service is signing off as of 05/16/2018   3)Dilated cardiomyopathy with acute systolic and diastolic CHF/chest pain.-Overall stable at this time, appears compensated, low-salt diet advised, continue to  use hemodialysis to address volume status, -Daily weight, - Transfuse for hemoglobin<8, -Cardiology consult appreciated, Myoview stress test on 05/14/2017 is abnormal, awaiting further cardiology input, , c/n Amlodipine 10 mg daily, BiDil,- Lasix  , C/n coreg 25 mg twice daily, lasix -  ,  aspirin. Hold off on statin due to HAART and advanced renal disease.  Cardiology service is signing off as of 05/17/2018  4)Uncontrolled essential HTN- - improved BP control, multifactorial, but mostly due to noncompliance with medication, - c/n Norvasc, Coreg, Lasix, BiDil   5)GI bleed/BRBPR- -  Currently patient refuses colonoscopy., -GI input appreciated,  GI signed off as of 05/16/2018  6)Normocytic anemia- -Anemia panel consistent with anemia of chronic disease.  Suspect a combination of AIDS +Renal disease does not appear to have significant GI bleed. ESA/Procrit as per nephrology team  7)Chronic boil of buttocks- -  Hx of chronic "boil" of buttock, -I&D in ED.  Culture not sent   8)Polysubstance abuse-- 6/25 UDS positive marijuana  9)Goal of care -6/26 PALLIATIVE CARE: Noncompliant patient with HIV, acute systolic CHF, acute renal failure possibly going on HD discussed change of status to DNR, discussed short-term vs long-term goals of care  10)CAD--Myoview stress test from  05/14/2018 with reversible ischemia along the septal wall, cardiologist recommends medical management, concerns about early complications if left heart catheterization is done , also should angioplasty and stenting be done patient would not likely be compliant with dual antiplatelet therapy.  Cardiology service is signing off as of 05/17/2018  11)Disposition--- patient will need outpatient hemodialysis chair/spot assigned prior to discharge home , significant concerns about patient's ability to be compliant with hemodialysis  , will need outpatient HD arranged for him in the [facility] area where he plans to return home   DVT prophylaxis: SCD Code Status: Full Family Communication: None Disposition Plan: Once outpatient hemodialysis center has been arranged  Consultants:  Cardiology ID Nephrology GI Palliative IR Rt Neck/Chest tunneled HD catheter placed on 05/17/18,  Procedures/Significant Events:  6/25 echocardiogram:Left ventricle: LVEF =45% to 50%. Diffuse hypokinesis.- Grade 1   diastolic dysfunction. - Left atrium:  moderately dilated.  05/14/2018 abnormal Myoview stress test  Rt Neck/Chest tunneled HD catheter placed on 05/17/18,  Cultures 6/24 MRSA PCR negative  Antimicrobials: Anti-infectives (From admission, onward)   Start     Stop   05/12/18 0730  darunavir-cobicistat (PREZCOBIX) 800-150 MG per tablet 1 tablet         05/12/18 0730  dolutegravir (TIVICAY) tablet 50 mg         05/12/18 0730  rilpivirine (EDURANT) tablet 25 mg         05/12/18 0700  dolutegravir (TIVICAY) tablet 50 mg  Status:  Discontinued     05/12/18 0047   05/12/18 0700  rilpivirine (EDURANT) tablet 25 mg  Status:  Discontinued     05/12/18 0047   05/12/18 0700  darunavir-cobicistat (PREZCOBIX) 800-150 MG per tablet 1 tablet  Status:  Discontinued     05/12/18 0047   05/11/18 1015  azithromycin (ZITHROMAX) tablet 1,200 mg         05/11/18 1015  sulfamethoxazole-trimethoprim (BACTRIM,SEPTRA)  400-80 MG per tablet 1 tablet  Status:  Discontinued     05/11/18 1228   05/11/18 0800  elvitegravir-cobicistat-emtricitabine-tenofovir (GENVOYA) 150-150-200-10 MG tablet 1 tablet  Status:  Discontinued     05/10/18 1931   05/11/18 0800  darunavir (PREZISTA) tablet 800 mg  Status:  Discontinued     05/10/18 1931       Continuous Infusions: . sodium chloride Stopped (05/12/18 1526)     Objective: Vitals:   05/20/18 1030 05/20/18 1055 05/20/18 1241 05/20/18 1547  BP: 128/81 115/69 116/74 104/65  Pulse: 76 76  65  Resp: 10 (!) 8  17  Temp:  98.9 F (37.2 C)  98.6 F (37 C)  TempSrc:  Oral  Oral  SpO2:  96%  96%  Weight:  76.3 kg (168 lb 3.4 oz)    Height:        Intake/Output Summary (Last 24 hours) at 05/20/2018 1705 Last data filed at 05/20/2018 1148 Gross per 24 hour  Intake 933 ml  Output 2500 ml  Net -1567 ml   Filed Weights   05/20/18 0452 05/20/18 0708 05/20/18 1055  Weight: 79.3 kg (174 lb 13.2 oz) 79.9 kg (176 lb 2.4 oz) 76.3 kg (168 lb  3.4 oz)    Examination:  Physical Exam  Gen:- Awake Alert, in no apparent distress  HEENT:- [place].AT, No sclera icterus Neck-Supple Neck,No JVD, Rt Neck/Chest tunneled HD catheter placed on 05/17/18,  Lungs-   no wheezing, fair air movement CV- S1, S2 normal Abd-  +ve B.Sounds, Abd Soft, No tenderness,    Extremity-1 + pitting edema,    Psych-affect is appropriate, oriented x3 Neuro-no new focal deficits, no tremors   CBC: Recent Labs  Lab 05/16/18 0236 05/17/18 0206 05/18/18 0800 05/19/18 1107 05/20/18 0156  WBC 7.4 7.4 8.3 7.9 8.0  HGB 8.2* 8.5* 8.8* 8.6* 7.7*  HCT 25.6* 26.5* 28.5* 27.5* 24.6*  MCV 86.2 86.6 88.2 88.4 89.1  PLT 268 283 321 281 710   Basic Metabolic Panel: Recent Labs  Lab 05/14/18 0253 05/15/18 0222 05/16/18 0236 05/17/18 0206 05/18/18 0317 05/19/18 0332 05/19/18 1100 05/20/18 0156  NA 142 142  141 141 140 139  --  139 139  K 3.4* 3.7  3.7 3.6 3.6 4.3  --  3.7 3.8  CL 114* 114*   114* 113* 111 108  --  105 103  CO2 17* 18*  18* 19* 19* 21*  --  23 27  GLUCOSE 93 109*  110* 107* 103* 108*  --  98 95  BUN 62* 60*  59* 56* 57* 59*  --  53* 42*  CREATININE 7.36* 7.85*  7.80* 8.04* 8.15* 8.16*  --  7.34* 6.55*  CALCIUM 7.8* 7.7*  7.7* 7.8* 7.9* 8.4*  --  8.2* 8.1*  MG 1.6* 2.1 1.9 2.0 1.9 1.7  --  1.8  PHOS 6.4* 6.8* 6.5*  --   --   --  6.1*  --    GFR: Estimated Creatinine Clearance: 12 mL/min (A) (by C-G formula based on SCr of 6.55 mg/dL (H)). Liver Function Tests: Recent Labs  Lab 05/14/18 0253 05/15/18 0222 05/16/18 0236 05/19/18 1100  ALBUMIN 1.9* 2.0* 1.9* 1.8*   No results for input(s): LIPASE, AMYLASE in the last 168 hours. No results for input(s): AMMONIA in the last 168 hours. Coagulation Profile: Recent Labs  Lab 05/17/18 0206  INR 1.04   Cardiac Enzymes: No results for input(s): CKTOTAL, CKMB, CKMBINDEX, TROPONINI in the last 168 hours. BNP (last 3 results) No results for input(s): PROBNP in the last 8760 hours. HbA1C: No results for input(s): HGBA1C in the last 72 hours. CBG: No results for input(s): GLUCAP in the last 168 hours. Lipid Profile: No results for input(s): CHOL, HDL, LDLCALC, TRIG, CHOLHDL, LDLDIRECT in the last 72 hours. Thyroid Function Tests: No results for input(s): TSH, T4TOTAL, FREET4, T3FREE, THYROIDAB in the last 72 hours. Anemia Panel: No results for input(s): VITAMINB12, FOLATE, FERRITIN, TIBC, IRON, RETICCTPCT in the last 72 hours. Urine analysis:    Component Value Date/Time   COLORURINE YELLOW 05/11/2018 0606   APPEARANCEUR HAZY (A) 05/11/2018 0606   LABSPEC 1.012 05/11/2018 0606   PHURINE 5.0 05/11/2018 0606   GLUCOSEU NEGATIVE 05/11/2018 0606   GLUCOSEU NEG mg/dL 11/23/2006 2036   HGBUR MODERATE (A) 05/11/2018 0606   BILIRUBINUR NEGATIVE 05/11/2018 0606   KETONESUR NEGATIVE 05/11/2018 0606   PROTEINUR >=300 (A) 05/11/2018 0606   UROBILINOGEN 0.2 11/23/2006 2036   NITRITE NEGATIVE 05/11/2018 0606    LEUKOCYTESUR NEGATIVE 05/11/2018 0606   Sepsis Labs: '@LABRCNTIP' (procalcitonin:4,lacticidven:4)  ) Recent Results (from the past 240 hour(s))  MRSA PCR Screening     Status: None   Collection Time: 05/10/18 11:00 PM  Result Value Ref  Range Status   MRSA by PCR NEGATIVE NEGATIVE Final    Comment:        The GeneXpert MRSA Assay (FDA approved for NASAL specimens only), is one component of a comprehensive MRSA colonization surveillance program. It is not intended to diagnose MRSA infection nor to guide or monitor treatment for MRSA infections. Performed at [facility], [place] [address], [place], [place] [address]     Radiology Studies: No results found.  Scheduled Meds: . amLODipine  10 mg Oral Daily  . aspirin  81 mg Oral Daily  . azithromycin  1,200 mg Oral Weekly  . calcium acetate  667 mg Oral TID WC  . carvedilol  25 mg Oral BID WC  . Chlorhexidine Gluconate Cloth  6 each Topical Q0600  . dapsone  100 mg Oral Daily  . darunavir-cobicistat  1 tablet Oral Q breakfast  . dolutegravir  50 mg Oral Q breakfast  . furosemide  40 mg Intravenous BID  . HYDROcodone-acetaminophen      . isosorbide-hydrALAZINE  1 tablet Oral TID  . rilpivirine  25 mg Oral Q breakfast  . senna-docusate  1 tablet Oral Daily  . sodium bicarbonate  1,300 mg Oral BID   Continuous Infusions: . sodium chloride Stopped (05/12/18 1526)     LOS: 10 days    Roxan Hockey, MD [facility]    If 7PM-7AM, please contact night-coverage [email or web address] Password [email or web address] 05/20/2018, 5:05 PM

## 2018-05-21 DIAGNOSIS — N185 Chronic kidney disease, stage 5: Secondary | ICD-10-CM

## 2018-05-21 DIAGNOSIS — Z992 Dependence on renal dialysis: Secondary | ICD-10-CM | POA: Diagnosis present

## 2018-05-21 DIAGNOSIS — N186 End stage renal disease: Secondary | ICD-10-CM | POA: Diagnosis present

## 2018-05-21 LAB — MAGNESIUM: Magnesium: 2 mg/dL (ref 1.7–2.4)

## 2018-05-21 NOTE — Progress Notes (Signed)
S: Feels well, no complaints but did report some numbness of his left foot last night O:BP 118/67 (BP Location: Left Arm)   Pulse 70   Temp 98.4 F (36.9 C) (Oral)   Resp 18   Ht 5\' 5"  (1.651 m)   Wt 76.7 kg (169 lb)   SpO2 95%   BMI 28.12 kg/m   Intake/Output Summary (Last 24 hours) at 05/21/2018 1309 Last data filed at 05/20/2018 2158 Gross per 24 hour  Intake 406 ml  Output -  Net 406 ml   Intake/Output: I/O last 3 completed shifts: In: 0355 [P.O.:1339] Out: 2500 [Other:2500]  Intake/Output this shift:  No intake/output data recorded. Weight change: -3.7 kg (-8 lb 2.5 oz) Gen: NAD CVS: no rub Resp: cta Abd: benign Ext: 1+ edema  Recent Labs  Lab 05/15/18 0222 05/16/18 0236 05/17/18 0206 05/18/18 0317 05/19/18 1100 05/20/18 0156  NA 142  141 141 140 139 139 139  K 3.7  3.7 3.6 3.6 4.3 3.7 3.8  CL 114*  114* 113* 111 108 105 103  CO2 18*  18* 19* 19* 21* 23 27  GLUCOSE 109*  110* 107* 103* 108* 98 95  BUN 60*  59* 56* 57* 59* 53* 42*  CREATININE 7.85*  7.80* 8.04* 8.15* 8.16* 7.34* 6.55*  ALBUMIN 2.0* 1.9*  --   --  1.8*  --   CALCIUM 7.7*  7.7* 7.8* 7.9* 8.4* 8.2* 8.1*  PHOS 6.8* 6.5*  --   --  6.1*  --    Liver Function Tests: Recent Labs  Lab 05/15/18 0222 05/16/18 0236 05/19/18 1100  ALBUMIN 2.0* 1.9* 1.8*   No results for input(s): LIPASE, AMYLASE in the last 168 hours. No results for input(s): AMMONIA in the last 168 hours. CBC: Recent Labs  Lab 05/16/18 0236 05/17/18 0206 05/18/18 0800 05/19/18 1107 05/20/18 0156  WBC 7.4 7.4 8.3 7.9 8.0  HGB 8.2* 8.5* 8.8* 8.6* 7.7*  HCT 25.6* 26.5* 28.5* 27.5* 24.6*  MCV 86.2 86.6 88.2 88.4 89.1  PLT 268 283 321 281 211   Cardiac Enzymes: No results for input(s): CKTOTAL, CKMB, CKMBINDEX, TROPONINI in the last 168 hours. CBG: No results for input(s): GLUCAP in the last 168 hours.  Iron Studies: No results for input(s): IRON, TIBC, TRANSFERRIN, FERRITIN in the last 72  hours. Studies/Results: No results found. Marland Kitchen amLODipine  10 mg Oral Daily  . aspirin  81 mg Oral Daily  . azithromycin  1,200 mg Oral Weekly  . calcium acetate  667 mg Oral TID WC  . carvedilol  25 mg Oral BID WC  . Chlorhexidine Gluconate Cloth  6 each Topical Q0600  . dapsone  100 mg Oral Daily  . darunavir-cobicistat  1 tablet Oral Q breakfast  . dolutegravir  50 mg Oral Q breakfast  . furosemide  40 mg Intravenous BID  . isosorbide-hydrALAZINE  1 tablet Oral TID  . rilpivirine  25 mg Oral Q breakfast  . senna-docusate  1 tablet Oral Daily  . sodium bicarbonate  1,300 mg Oral BID    BMET    Component Value Date/Time   NA 139 05/20/2018 0156   K 3.8 05/20/2018 0156   CL 103 05/20/2018 0156   CO2 27 05/20/2018 0156   GLUCOSE 95 05/20/2018 0156   BUN 42 (H) 05/20/2018 0156   CREATININE 6.55 (H) 05/20/2018 0156   CREATININE 0.67 (L) 04/23/2017 1728   CALCIUM 8.1 (L) 05/20/2018 0156   CALCIUM 7.3 (L) 05/13/2018 0241   GFRNONAA  8 (L) 05/20/2018 0156   GFRNONAA >89 09/14/2014 0947   GFRAA 10 (L) 05/20/2018 0156   GFRAA >89 09/14/2014 0947   CBC    Component Value Date/Time   WBC 8.0 05/20/2018 0156   RBC 2.76 (L) 05/20/2018 0156   HGB 7.7 (L) 05/20/2018 0156   HGB 8.9 (L) 05/12/2018 1038   HGB 13.7 11/20/2008 1531   HCT 24.6 (L) 05/20/2018 0156   HCT 40.5 11/20/2008 1531   PLT 211 05/20/2018 0156   PLT 172 11/20/2008 1531   MCV 89.1 05/20/2018 0156   MCV 90.7 11/20/2008 1531   MCH 27.9 05/20/2018 0156   MCHC 31.3 05/20/2018 0156   RDW 16.3 (H) 05/20/2018 0156   RDW 15.0 (H) 11/20/2008 1531   LYMPHSABS 1,054 04/23/2017 1728   LYMPHSABS 1.5 11/20/2008 1531   MONOABS 465 04/23/2017 1728   MONOABS 0.3 11/20/2008 1531   EOSABS 93 04/23/2017 1728   EOSABS 0.1 11/20/2008 1531   BASOSABS 31 04/23/2017 1728   BASOSABS 0.0 11/20/2008 1531     Assessment/Plan:  1. AKI/progressive CKD due to poorly controlled HTN and HIV, now appears to be ESRD. Creatinine  continued to climb and [place] consistent with chronic medical renal disease. He started on HD 05/18/18 s/p [facility] placement and is currently receiving his 3rd treatment. He will need longterm access placement and I haveconsultedVVS. He may regain some renal function with treatment of his HIV and HTN but for now will require ongoing HD. 1. Cont with TTS schedule 2. CLIP process underway but will be difficult as he lives in [facility] and has not seen a nephrologist there before. 2. HIV- poorly controlled. Started on Tivicay, prezcobix, and RPV per ID. 3. Dilated CMP- UF with HD and cont with carvedilol and bidil per Cardiology 4. Uncontrolled HTN- improved with meds 5. Anemia of chronic disease- will start esa with HD and check iron stores 6. Severe protein malnutrition- consider protein supplements 7. Disposition- will be difficult if he lives in [facility] and has not had any nephrology care there compounded by his history on nonadherence with medications and follow up.   Donetta Potts, MD [employer] [phone]

## 2018-05-21 NOTE — Consult Note (Addendum)
[facility] NOTE   MRN : [id number]  Reason for Consult: ESRD Referring Physician: Dr. Marval Regal  History of Present Illness: 57 y/o male with history of uncontrolled HTN has developed ESRD.  He is currently on HD via [facility].  We have been asked to place permanent access.     Past medical history includes HIV with non compliance of antiviral medications and HTN.  He lives in [place] and returns to [place] for ID f/u.  Had SOB and CP while at ID office and was sent to [facility] for further work up.   AKI on CKD with LE edema.      Current Facility-Administered Medications  Medication Dose Route Frequency Provider Last Rate Last Dose  . 0.9 %  sodium chloride infusion   Intravenous Continuous PRN Allie Bossier, MD   Stopped at 05/12/18 1526  . acetaminophen (TYLENOL) tablet 650 mg  650 mg Oral Q6H PRN Allie Bossier, MD   650 mg at 05/14/18 1747  . amLODipine (NORVASC) tablet 10 mg  10 mg Oral Daily Allie Bossier, MD   10 mg at 05/21/18 0900  . aspirin chewable tablet 81 mg  81 mg Oral Daily Pixie Casino, MD   81 mg at 05/21/18 0900  . azithromycin (ZITHROMAX) tablet 1,200 mg  1,200 mg Oral Weekly Allie Bossier, MD   1,200 mg at 05/18/18 1257  . bisacodyl (DULCOLAX) suppository 10 mg  10 mg Rectal Daily PRN Allie Bossier, MD   10 mg at 05/20/18 2154  . calcium acetate (PHOSLO) capsule 667 mg  667 mg Oral TID WC Rosita Fire, MD   667 mg at 05/21/18 0900  . carvedilol (COREG) tablet 25 mg  25 mg Oral BID WC Rosita Fire, MD   25 mg at 05/21/18 0900  . Chlorhexidine Gluconate Cloth 2 % PADS 6 each  6 each Topical Q0600 Rosita Fire, MD   6 each at 05/21/18 [phone]  . dapsone tablet 100 mg  100 mg Oral Daily Golden Circle, FNP   100 mg at 05/21/18 0901  . darunavir-cobicistat (PREZCOBIX) 800-150 MG per tablet 1 tablet  1 tablet Oral Q breakfast Carlyle Basques, MD   1 tablet at 05/21/18 0901  . dolutegravir (TIVICAY)  tablet 50 mg  50 mg Oral Q breakfast Carlyle Basques, MD   50 mg at 05/21/18 0901  . furosemide (LASIX) injection 40 mg  40 mg Intravenous BID Allie Bossier, MD   40 mg at 05/21/18 0859  . HYDROcodone-acetaminophen (NORCO/VICODIN) 5-325 MG per tablet 1-2 tablet  1-2 tablet Oral Q4H PRN Allie Bossier, MD   1 tablet at 05/20/18 2153  . isosorbide-hydrALAZINE (BIDIL) 20-37.5 MG per tablet 1 tablet  1 tablet Oral TID Allie Bossier, MD   1 tablet at 05/21/18 0859  . labetalol (NORMODYNE,TRANDATE) injection 10 mg  10 mg Intravenous Q2H PRN Allie Bossier, MD   10 mg at 05/10/18 2317  . ondansetron (ZOFRAN) tablet 4 mg  4 mg Oral Q6H PRN Allie Bossier, MD   4 mg at 05/14/18 2951   Or  . ondansetron [facility]) injection 4 mg  4 mg Intravenous Q6H PRN Allie Bossier, MD   4 mg at 05/18/18 1729  . rilpivirine (EDURANT) tablet 25 mg  25 mg Oral Q breakfast Tommy Medal, Lavell Islam, MD   25 mg at 05/21/18 0901  . senna-docusate (Senokot-S) tablet 1 tablet  1 tablet Oral QHS PRN Allie Bossier, MD   1 tablet at 05/17/18 2242  . senna-docusate (Senokot-S) tablet 1 tablet  1 tablet Oral Daily Allie Bossier, MD   1 tablet at 05/21/18 0900  . sodium bicarbonate tablet 1,300 mg  1,300 mg Oral BID Allie Bossier, MD   1,300 mg at 05/21/18 0859  . traMADol (ULTRAM) tablet 50 mg  50 mg Oral Q12H PRN Allie Bossier, MD        Pt meds include: Statin :No Betablocker: No ASA: No Other anticoagulants/antiplatelets: none  Past Medical History:  Diagnosis Date  . 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 Candidiasis of mouth   . Chest pain, unspecified   . Diseases of lips    Angular cheilitis  . HIV infection ([place])   . Hypertension   . Lymphoma ([place])   . Personal history of unspecified disease of respiratory system   . Pneumocystosis ([place])   . Shingles   . Tobacco use     Past Surgical History:  Procedure Laterality Date  . IR FLUORO GUIDE CV LINE RIGHT  05/17/2018  . IR US  GUIDE VASC ACCESS RIGHT  05/17/2018    Social History Social History   Tobacco Use  . Smoking status: Former Smoker    Packs/day: 1.00    Years: 35.00    Pack years: 35.00    Last attempt to quit: 11/23/2015    Years since quitting: 2.4  . Smokeless tobacco: Never Used  . Tobacco comment: would like help quitting  Substance Use Topics  . Alcohol use: Yes    Alcohol/week: 0.0 oz    Comment: beer weekends  . Drug use: No    Family History Family History  Problem Relation Age of Onset  . Heart disease Mother   . Diabetes Mother   . Cancer Mother   . Diabetes Sister   . CAD Sister        ? stents  . Diabetes Maternal Grandmother   . Hypertension Brother     Allergies  Allergen Reactions  . Penicillins Anaphylaxis    Has patient had a PCN reaction causing immediate rash, facial/tongue/throat swelling, SOB or lightheadedness with hypotension: Unknown Has patient had a PCN reaction causing severe rash involving mucus membranes or skin necrosis: Unknown Has patient had a PCN reaction that required hospitalization: Unknown Has patient had a PCN reaction occurring within the last 10 years: No If all of the above answers are "NO", then may proceed with Cephalosporin use.     REVIEW OF SYSTEMS  General: [ ]  Weight loss, [ ]  Fever, [ ]  chills Neurologic: [ ]  Dizziness, [ ]  Blackouts, [ ]  Seizure [ ]  Stroke, [ ]  "Mini stroke", [ ]  Slurred speech, [ ]  Temporary blindness; [ ]  weakness in arms or legs, [ ]  Hoarseness [ ]  Dysphagia Cardiac: [x ] Chest pain/pressure, [x ] Shortness of breath at rest [x ] Shortness of breath with exertion, [ ]  Atrial fibrillation or irregular heartbeat  Vascular: [ ]  Pain in legs with walking, [ ]  Pain in legs at rest, [ ]  Pain in legs at night,  [ ]  Non-healing ulcer, [ ]  Blood clot in vein/DVT,   Pulmonary: [ ]  Home oxygen, [ ]  Productive cough, [ ]  Coughing up blood, [ ]  Asthma,  [ ]  Wheezing [ ]  COPD Musculoskeletal:  [ ]  Arthritis, [ ]  Low back  pain, [ ]  Joint pain  Hematologic: [ ]  Easy Bruising, [ ]  Anemia; [ ]  Hepatitis Gastrointestinal: [x ] Blood in stool, [ ]  Gastroesophageal Reflux/heartburn, Urinary: [ ]  chronic Kidney disease, [x ] on HD - [ ]  MWF or [ ]  TTHS, [ ]  Burning with urination, [ ]  Difficulty urinating Skin: [ ]  Rashes, [ ]  Wounds Psychological: [ ]  Anxiety, [ ]  Depression  Physical Examination Vitals:   05/20/18 1547 05/20/18 2100 05/21/18 0500 05/21/18 0842  BP: 104/65 114/72  118/67  Pulse: 65 72  70  Resp: 17 18  18   Temp: 98.6 F (37 C) 98.8 F (37.1 C)  98.4 F (36.9 C)  TempSrc: Oral Oral  Oral  SpO2: 96% 97%  95%  Weight:   169 lb (76.7 kg)   Height:       Body mass index is 28.12 kg/m.  General:  WDWN in NAD HENT: WNL, normocephalic Eyes: Pupils equal Pulmonary: normal non-labored breathing , without Rales, rhonchi,  wheezing Cardiac: RRR, without  Murmurs, rubs or gallops; No carotid bruits Abdomen: soft, NT, no masses Skin: no rashes, ulcers noted;  no Gangrene , no cellulitis; no open wounds;   Vascular Exam/Pulses:palpable radial brachial pulses B   Musculoskeletal: no muscle wasting or atrophy; no edema  Neurologic: A&O X 3; Appropriate Affect ;  SENSATION: normal; MOTOR FUNCTION: 5/5 Symmetric Speech is fluent/normal   Significant Diagnostic Studies: CBC Lab Results  Component Value Date   WBC 8.0 05/20/2018   HGB 7.7 (L) 05/20/2018   HCT 24.6 (L) 05/20/2018   MCV 89.1 05/20/2018   PLT 211 05/20/2018    BMET    Component Value Date/Time   NA 139 05/20/2018 0156   K 3.8 05/20/2018 0156   CL 103 05/20/2018 0156   CO2 27 05/20/2018 0156   GLUCOSE 95 05/20/2018 0156   BUN 42 (H) 05/20/2018 0156   CREATININE 6.55 (H) 05/20/2018 0156   CREATININE 0.67 (L) 04/23/2017 1728   CALCIUM 8.1 (L) 05/20/2018 0156   CALCIUM 7.3 (L) 05/13/2018 0241   GFRNONAA 8 (L) 05/20/2018 0156   GFRNONAA >89 09/14/2014 0947   GFRAA 10 (L) 05/20/2018 0156   GFRAA >89 09/14/2014 0947    Estimated Creatinine Clearance: 12 mL/min (A) (by C-G formula based on SCr of 6.55 mg/dL (H)).  COAG Lab Results  Component Value Date   INR 1.04 05/17/2018   INR 1.04 05/10/2018   INR 1.00 05/12/2012     Non-Invasive Vascular Imaging:  Vein mapping shows acceptable left cephalic vein  ASSESSMENT/PLAN:  AKI on CKD.  He is right hand dominant and has an acceptable left cephalic vein.  Plan for left UE av fistula creation early next week.  Will save left arm and move IV.   Roxy Horseman 05/21/2018 9:55 AM  I have examined the patient, reviewed and agree with above.  Discussed with patient.  We will plan left AV fistula on Monday  Curt Jews, MD 05/21/2018 2:39 PM

## 2018-05-21 NOTE — Progress Notes (Signed)
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 [name] on 05/10/2018 w/ right-sided chest pain with radiation to the right arm, nausea, and SOB after walking a short distance.  He also reported B LE swelling, paroxysmal nocturnal dyspnea, and bright red blood per rectum for a weeks duration.  Rt Neck/Chest tunneled HD catheter placed on 05/17/18, first HD session on 05/18/18.Marland Kitchen CLIP process underway but will be difficult as he lives in [facility] and has not seen a nephrologist there before.    Subjective:-Cooperative, no fevers, no chest pains, shortness of breath and dyspnea on exertion and better,   Plan:-  1)Acute kidney injury superimposed on progressive CKD V/ ESRD-   in the setting of uncontrolled hypertension and noncompliance with HIV medications (hypertensive nephropathy And HIV nephropathy)- creatinine was >  8,  -Discussed case with nephrology Dr. Blake Divine ,   Rt Neck/Chest tunneled HD catheter placed on 05/17/18 (initiated on HD on 05/18/18), tolerated HD session on 05/20/18, patient now appears to be in ESRD according to Dr. Marval Regal. Please see Nephrology progress note dated 05/21/2018 . cont with TTS schedule, CLIP process underway but will be  difficult as he lives in [facility] and has not seen a nephrologist there before. There significant concerns about patient's ability to be compliant with hemodialysis  , will need outpatient HD arranged for him in the [facility] area where he plans to return home.  Vascular surgery has been consulted to help with permanent hemodialysis access placement  - Avoid nephrotoxic medication Recent Labs  Lab 05/15/18 0222 05/16/18 0236 05/17/18 0206 05/18/18 0317 05/19/18 1100 05/20/18 0156  CREATININE 7.85*  7.80* 8.04* 8.15* 8.16* 7.34* 6.55*   2)HIV/AIDS- - Noncompliant with medication.- - HIV medications ( Dr Tommy Medal changed to a more renal based regimen of TIVICAY PREZCOBIX and RPV)  per ID,   CD4 count was 50,   ID consult appreciated,  HLA B701 ordered,  -Azithromycin 1200 mg weekly (q Tuesday), dapsone 100 mg weekly (q Thursday),  , -Prezcobix 800-150 mg daily, -Tivicay 50 mg daily, -Edurant 25 mg daily  Infectious disease service is signing off as of 05/16/2018   3)Dilated cardiomyopathy with acute systolic and diastolic (Combined) CHF/chest pain.-Overall stable at this time, appears compensated, last known EF is 45 to 50%, low-salt diet advised, continue to  use hemodialysis to address volume status, -Daily weight, - Transfuse for hemoglobin<8, -Cardiology consult appreciated, Myoview stress test on 05/14/2017 is abnormal, awaiting further cardiology input, , c/n Amlodipine 10 mg daily, BiDil,- Lasix  , C/n coreg 25 mg twice daily, lasix -  ,  aspirin. Hold off on statin due to HAART  and advanced renal disease.    Cardiology service is signing off as of 05/17/2018  4)Uncontrolled essential HTN- - improved BP control, multifactorial, but mostly due to noncompliance with medication, - c/n Norvasc 10 mg daily, Coreg twice daily, Lasix 40 mg IV twice daily, BiDil 1 tab 3 times daily   5)GI bleed/BRBPR- -  Currently patient refuses colonoscopy., -GI input appreciated,    GI signed off as  of 05/16/2018  6)Normocytic anemia- -Anemia panel consistent with anemia of chronic disease.  Suspect a combination of AIDS +Renal disease does not appear to have significant GI bleed. ESA/Procrit as per nephrology team  7)Chronic boil of buttocks- -Hx of chronic "boil" of buttock, -I&D in ED.  Culture not sent   8)Polysubstance abuse-- 6/25 UDS positive marijuana  9)Goal of care -6/26 PALLIATIVE CARE: Noncompliant patient with HIV, acute systolic CHF, acute renal failure possibly going on HD discussed change of status to DNR, discussed short-term vs long-term goals of care  10)CAD--Myoview stress test from 05/14/2018 with reversible ischemia along the septal wall, cardiologist recommends medical management, concerns about early complications if left heart catheterization is done , also should angioplasty and stenting be done patient would not likely be compliant with dual antiplatelet therapy.  Cardiology service is signing off as of 05/17/2018  11)Disposition--- patient will need outpatient hemodialysis chair/spot assigned prior to discharge home , significant concerns about patient's ability to be compliant with hemodialysis  , will need outpatient HD arranged for him in the [facility] area where he plans to return home, Cont with TTS schedule, CLIP process underway but will be difficult as he lives in [facility] and has not seen a nephrologist there before.   DVT prophylaxis: SCD Code Status: Full Family Communication: None Disposition Plan: Once outpatient hemodialysis center has been arranged  Consultants:  Cardiology ID Nephrology GI Palliative IR Rt Neck/Chest tunneled HD catheter placed on 05/17/18,  Procedures/Significant Events:  6/25 echocardiogram:Left ventricle: LVEF =45% to 50%. Diffuse hypokinesis.- Grade 1   diastolic dysfunction. - Left atrium:  moderately dilated.  05/14/2018 abnormal Myoview stress test  Rt Neck/Chest tunneled HD catheter placed on  05/17/18,  Cultures 6/24 MRSA PCR negative  Antimicrobials: Anti-infectives (From admission, onward)   Start     Stop   05/12/18 0730  darunavir-cobicistat (PREZCOBIX) 800-150 MG per tablet 1 tablet         05/12/18 0730  dolutegravir (TIVICAY) tablet 50 mg         05/12/18 0730  rilpivirine (EDURANT) tablet 25 mg         05/12/18 0700  dolutegravir (TIVICAY) tablet 50 mg  Status:  Discontinued     05/12/18 0047   05/12/18 0700  rilpivirine (EDURANT) tablet 25 mg  Status:  Discontinued     05/12/18 0047   05/12/18 0700  darunavir-cobicistat (PREZCOBIX) 800-150 MG per tablet 1 tablet  Status:  Discontinued     05/12/18 0047   05/11/18 1015  azithromycin (ZITHROMAX) tablet 1,200 mg         05/11/18 1015  sulfamethoxazole-trimethoprim (BACTRIM,SEPTRA) 400-80 MG per tablet 1 tablet  Status:  Discontinued     05/11/18 1228   05/11/18 0800  elvitegravir-cobicistat-emtricitabine-tenofovir (GENVOYA) 150-150-200-10 MG tablet 1 tablet  Status:  Discontinued     05/10/18 1931   05/11/18 0800  darunavir (PREZISTA) tablet 800 mg  Status:  Discontinued     05/10/18 1931       Continuous Infusions: . sodium chloride Stopped (05/12/18 1526)  Objective: Vitals:   05/20/18 1547 05/20/18 2100 05/21/18 0500 05/21/18 0842  BP: 104/65 114/72  118/67  Pulse: 65 72  70  Resp: _0 Temp: 98.6 F (37 C) 98.8 F (37.1 C)  98.4 F (36.9 C)  TempSrc: Oral Oral  Oral  SpO2: 96% 97%  95%  Weight:   76.7 kg (169 lb)   Height:        Intake/Output Summary (Last 24 hours) at 05/21/2018 1500 Last data filed at 05/20/2018 2158 Gross per 24 hour  Intake 406 ml  Output -  Net 406 ml   Filed Weights   05/20/18 0708 05/20/18 1055 05/21/18 0500  Weight: 79.9 kg (176 lb 2.4 oz) 76.3 kg (168 lb 3.4 oz) 76.7 kg (169 lb)    Examination:  Physical Exam  Gen:- Awake Alert, in no apparent distress  HEENT:- [place].AT, No sclera icterus Neck-Supple Neck,No JVD, Rt Neck/Chest tunneled HD catheter  placed on 05/17/18,  Lungs-   no wheezing, fair air movement CV- S1, S2 normal Abd-  +ve B.Sounds, Abd Soft, No tenderness,    Extremity-1 + pitting edema,    Psych-affect is appropriate, oriented x3 Neuro-no new focal deficits, no tremors   CBC: Recent Labs  Lab 05/16/18 0236 05/17/18 0206 05/18/18 0800 05/19/18 1107 05/20/18 0156  WBC 7.4 7.4 8.3 7.9 8.0  HGB 8.2* 8.5* 8.8* 8.6* 7.7*  HCT 25.6* 26.5* 28.5* 27.5* 24.6*  MCV 86.2 86.6 88.2 88.4 89.1  PLT 268 283 321 281 891   Basic Metabolic Panel: Recent Labs  Lab 05/15/18 0222 05/16/18 0236 05/17/18 0206 05/18/18 0317 05/19/18 0332 05/19/18 1100 05/20/18 0156 05/21/18 0600  NA 142  141 141 140 139  --  139 139  --   K 3.7  3.7 3.6 3.6 4.3  --  3.7 3.8  --   CL 114*  114* 113* 111 108  --  105 103  --   CO2 18*  18* 19* 19* 21*  --  23 27  --   GLUCOSE 109*  110* 107* 103* 108*  --  98 95  --   BUN 60*  59* 56* 57* 59*  --  53* 42*  --   CREATININE 7.85*  7.80* 8.04* 8.15* 8.16*  --  7.34* 6.55*  --   CALCIUM 7.7*  7.7* 7.8* 7.9* 8.4*  --  8.2* 8.1*  --   MG 2.1 1.9 2.0 1.9 1.7  --  1.8 2.0  PHOS 6.8* 6.5*  --   --   --  6.1*  --   --    GFR: Estimated Creatinine Clearance: 12 mL/min (A) (by C-G formula based on SCr of 6.55 mg/dL (H)). Liver Function Tests: Recent Labs  Lab 05/15/18 0222 05/16/18 0236 05/19/18 1100  ALBUMIN 2.0* 1.9* 1.8*   No results for input(s): LIPASE, AMYLASE in the last 168 hours. No results for input(s): AMMONIA in the last 168 hours. Coagulation Profile: Recent Labs  Lab 05/17/18 0206  INR 1.04   Cardiac Enzymes: No results for input(s): CKTOTAL, CKMB, CKMBINDEX, TROPONINI in the last 168 hours. BNP (last 3 results) No results for input(s): PROBNP in the last 8760 hours. HbA1C: No results for input(s): HGBA1C in the last 72 hours. CBG: No results for input(s): GLUCAP in the last 168 hours. Lipid Profile: No results for input(s): CHOL, HDL, LDLCALC, TRIG, CHOLHDL,  LDLDIRECT in the last 72 hours. Thyroid Function Tests: No results for input(s): TSH, T4TOTAL, FREET4, T3FREE, THYROIDAB  in the last 72 hours. Anemia Panel: No results for input(s): VITAMINB12, FOLATE, FERRITIN, TIBC, IRON, RETICCTPCT in the last 72 hours. Urine analysis:    Component Value Date/Time   COLORURINE YELLOW 05/11/2018 0606   APPEARANCEUR HAZY (A) 05/11/2018 0606   LABSPEC 1.012 05/11/2018 0606   PHURINE 5.0 05/11/2018 0606   GLUCOSEU NEGATIVE 05/11/2018 0606   GLUCOSEU NEG mg/dL 11/23/2006 2036   HGBUR MODERATE (A) 05/11/2018 0606   BILIRUBINUR NEGATIVE 05/11/2018 0606   KETONESUR NEGATIVE 05/11/2018 0606   PROTEINUR >=300 (A) 05/11/2018 0606   UROBILINOGEN 0.2 11/23/2006 2036   NITRITE NEGATIVE 05/11/2018 0606   LEUKOCYTESUR NEGATIVE 05/11/2018 0606   Sepsis Labs: _0 (procalcitonin:4,lacticidven:4)  ) No results found for this or any previous visit (from the past 240 hour(s)).  Radiology Studies: No results found.  Scheduled Meds: . amLODipine  10 mg Oral Daily  . aspirin  81 mg Oral Daily  . azithromycin  1,200 mg Oral Weekly  . calcium acetate  667 mg Oral TID WC  . carvedilol  25 mg Oral BID WC  . Chlorhexidine Gluconate Cloth  6 each Topical Q0600  . dapsone  100 mg Oral Daily  . darunavir-cobicistat  1 tablet Oral Q breakfast  . dolutegravir  50 mg Oral Q breakfast  . furosemide  40 mg Intravenous BID  . isosorbide-hydrALAZINE  1 tablet Oral TID  . rilpivirine  25 mg Oral Q breakfast  . senna-docusate  1 tablet Oral Daily  . sodium bicarbonate  1,300 mg Oral BID   Continuous Infusions: . sodium chloride Stopped (05/12/18 1526)     LOS: 11 days    Roxan Hockey, MD [facility]    If 7PM-7AM, please contact night-coverage [email or web address] Password [email or web address] 05/21/2018, 3:00 PM

## 2018-05-21 NOTE — Progress Notes (Signed)
Spoke to patient at bedside. He states that he lives in [place]. Returning address will be [address] [place], [place] [address]. He states that he travels to [place] to see Dr [name] at [facility] clinic. He states that he was told he will need long term HD this afternoon.  Per Nephrology note: 1. He will need longterm access placement and I haveconsultedVVS. He may regain some renal function with treatment of his HIV and HTN but for now will require ongoing HD. 1. Cont with TTS schedule 2. CLIP process underway but will be difficult as he lives in [facility] and has not seen a nephrologist there before.  CM will continue to follow.

## 2018-05-22 DIAGNOSIS — K922 Gastrointestinal hemorrhage, unspecified: Secondary | ICD-10-CM

## 2018-05-22 DIAGNOSIS — N186 End stage renal disease: Secondary | ICD-10-CM

## 2018-05-22 LAB — RENAL FUNCTION PANEL
ALBUMIN: 2.1 g/dL — AB (ref 3.5–5.0)
ANION GAP: 11 (ref 5–15)
BUN: 41 mg/dL — ABNORMAL HIGH (ref 6–20)
CALCIUM: 8.6 mg/dL — AB (ref 8.9–10.3)
CO2: 28 mmol/L (ref 22–32)
CREATININE: 7.22 mg/dL — AB (ref 0.61–1.24)
Chloride: 99 mmol/L (ref 98–111)
GFR calc non Af Amer: 8 mL/min — ABNORMAL LOW (ref >60)
GFR, EST AFRICAN AMERICAN: 9 mL/min — AB (ref >60)
Glucose, Bld: 88 mg/dL (ref 70–99)
PHOSPHORUS: 5.3 mg/dL — AB (ref 2.5–4.6)
Potassium: 4.5 mmol/L (ref 3.5–5.1)
Sodium: 138 mmol/L (ref 135–145)

## 2018-05-22 LAB — CBC
HCT: 28 % — ABNORMAL LOW (ref 39.0–52.0)
HEMOGLOBIN: 8.4 g/dL — AB (ref 13.0–17.0)
MCH: 27.5 pg (ref 26.0–34.0)
MCHC: 30 g/dL (ref 30.0–36.0)
MCV: 91.8 fL (ref 78.0–100.0)
PLATELETS: 239 10*3/uL (ref 150–400)
RBC: 3.05 MIL/uL — AB (ref 4.22–5.81)
RDW: 15.9 % — ABNORMAL HIGH (ref 11.5–15.5)
WBC: 9.5 10*3/uL (ref 4.0–10.5)

## 2018-05-22 LAB — MAGNESIUM: Magnesium: 1.9 mg/dL (ref 1.7–2.4)

## 2018-05-22 MED ORDER — LACTULOSE 10 GM/15ML PO SOLN
20.0000 g | Freq: Three times a day (TID) | ORAL | Status: AC
  Administered 2018-05-22: 20 g via ORAL
  Filled 2018-05-22 (×2): qty 30

## 2018-05-22 NOTE — Progress Notes (Signed)
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 [name] on 05/10/2018 w/ right-sided chest pain with radiation to the right arm, nausea, and SOB after walking a short distance.  He also reported B LE swelling, paroxysmal nocturnal dyspnea, and bright red blood per rectum for a weeks duration.  Rt Neck/Chest tunneled HD catheter placed on 05/17/18, first HD session on 05/18/18.Marland Kitchen CLIP process underway but will be difficult as he lives in [facility] and has not seen a nephrologist there before.    Subjective:-No complaints  Plan:-  1)Acute kidney injury superimposed on progressive CKD V/ ESRD-   in the setting of uncontrolled hypertension and noncompliance with HIV medications (hypertensive nephropathy And HIV nephropathy)- creatinine was >  8,  -Discussed case with nephrology Dr. Blake Divine ,   Rt Neck/Chest tunneled HD catheter placed on 05/17/18 (initiated on HD on 05/18/18), tolerated HD session on 05/20/18, patient now appears to be in ESRD according to Dr. Marval Regal. Please see Nephrology progress note dated 05/21/2018 . cont with TTS schedule, CLIP process underway but will be difficult as he lives in [facility] and has not seen a nephrologist there before.  There significant concerns about patient's ability to be compliant with hemodialysis  , will need outpatient HD arranged for him in the [place] area where he plans to return home.  Vascular surgery has been consulted to help with permanent hemodialysis access placement and this will be done Monday.  - Avoid nephrotoxic medication Recent Labs  Lab 05/16/18 0236 05/17/18 0206 05/18/18 0317 05/19/18 1100 05/20/18 0156  CREATININE 8.04* 8.15* 8.16* 7.34* 6.55*   2)HIV/AIDS- - Noncompliant with medication.- - HIV medications ( Dr Tommy Medal changed to a more renal based regimen of TIVICAY PREZCOBIX and RPV)  per ID,   CD4 count was 50,   ID consult appreciated,  HLA B701 ordered,  -Azithromycin 1200 mg weekly (q Tuesday), dapsone 100 mg weekly (q Thursday),  , -Prezcobix 800-150 mg daily, -Tivicay 50 mg daily, -Edurant 25 mg daily  Infectious disease service is signing off as of 05/16/2018   3)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 Hold off on statin due to HAART and advanced renal disease.  Cardiology service is signing off as of 05/17/2018  4)Uncontrolled essential HTN- - improved BP control, multifactorial, but mostly due to noncompliance with medication, - c/n Norvasc 10 mg daily, Coreg twice daily, Lasix 40 mg IV twice daily, BiDil 1 tab 3 times daily   5)GI bleed/BRBPR- -  Currently patient refuses colonoscopy., -GI input appreciated, no further bleeding in the last several  days.    GI signed off as of 05/16/2018  6)Normocytic anemia- -Anemia panel consistent with anemia of chronic disease.  Suspect a combination of AIDS +Renal disease does not appear to have significant GI bleed. ESA/Procrit as per nephrology team  7)Chronic boil of buttocks- -Hx of chronic "boil" of buttock, -I&D in ED.  Culture not sent   8)Polysubstance abuse-- 6/25 UDS positive marijuana  9)Goal of care -6/26 PALLIATIVE CARE: Noncompliant patient with HIV, acute systolic CHF, acute renal failure possibly going on HD discussed change of status to DNR, discussed short-term vs long-term goals of care  10)CAD--Myoview stress test from 05/14/2018 with reversible ischemia along the septal wall, cardiologist recommends medical management, concerns about early complications if left heart catheterization is done , also should angioplasty and stenting be done patient would not likely be compliant with dual antiplatelet therapy.  Cardiology service is signing off as of 05/17/2018  11)Disposition--- patient will need outpatient hemodialysis chair/spot assigned prior to discharge home , significant concerns about patient's ability to be compliant with hemodialysis  , will need outpatient HD arranged for him in the [facility] area where he plans to return home, Cont with TTS schedule, CLIP process underway but will be difficult as he lives in [facility] and has not seen a nephrologist there before.   DVT prophylaxis: SCD Code Status: Full Family Communication: None Disposition Plan: Dialysis access with vascular surgery will be done Monday  Consultants:  Cardiology ID Nephrology GI Palliative IR Rt Neck/Chest tunneled HD catheter placed on 05/17/18 Vascular surgery  Procedures/Significant Events:  6/25 echocardiogram:Left ventricle: LVEF =45% to 50%. Diffuse hypokinesis.- Grade 1   diastolic dysfunction. - Left atrium:  moderately dilated.  05/14/2018 abnormal Myoview stress test  Rt  Neck/Chest tunneled HD catheter placed on 05/17/18,  Cultures 6/24 MRSA PCR negative  Antimicrobials: Anti-infectives (From admission, onward)   Start     Stop   05/12/18 0730  darunavir-cobicistat (PREZCOBIX) 800-150 MG per tablet 1 tablet         05/12/18 0730  dolutegravir (TIVICAY) tablet 50 mg         05/12/18 0730  rilpivirine (EDURANT) tablet 25 mg         05/12/18 0700  dolutegravir (TIVICAY) tablet 50 mg  Status:  Discontinued     05/12/18 0047   05/12/18 0700  rilpivirine (EDURANT) tablet 25 mg  Status:  Discontinued     05/12/18 0047   05/12/18 0700  darunavir-cobicistat (PREZCOBIX) 800-150 MG per tablet 1 tablet  Status:  Discontinued     05/12/18 0047   05/11/18 1015  azithromycin (ZITHROMAX) tablet 1,200 mg         05/11/18 1015  sulfamethoxazole-trimethoprim (BACTRIM,SEPTRA) 400-80 MG per tablet 1 tablet  Status:  Discontinued     05/11/18 1228   05/11/18 0800  elvitegravir-cobicistat-emtricitabine-tenofovir (GENVOYA) 150-150-200-10 MG tablet 1 tablet  Status:  Discontinued     05/10/18 1931   05/11/18 0800  darunavir (PREZISTA) tablet 800 mg  Status:  Discontinued     05/10/18 1931       Continuous Infusions: . sodium chloride Stopped (  05/12/18 1526)     Objective: Vitals:   05/21/18 2055 05/21/18 2319 05/22/18 0554 05/22/18 0754  BP: 115/67 (!) 103/59  138/80  Pulse: 63 61  63  Resp:  14  14  Temp:  98.1 F (36.7 C)  98.3 F (36.8 C)  TempSrc:  Oral  Oral  SpO2:  95%  92%  Weight:   72.5 kg (159 lb 13.3 oz)   Height:        Intake/Output Summary (Last 24 hours) at 05/22/2018 0938 Last data filed at 05/22/2018 0817 Gross per 24 hour  Intake 1296 ml  Output -  Net 1296 ml   Filed Weights   05/20/18 1055 05/21/18 0500 05/22/18 0554  Weight: 76.3 kg (168 lb 3.4 oz) 76.7 kg (169 lb) 72.5 kg (159 lb 13.3 oz)    Examination:  Physical Exam  Gen:- Awake Alert, in no apparent distress  HEENT:- [email or web address], No sclera icterus Neck-Supple Neck,No JVD, Rt  Neck/Chest tunneled HD catheter placed on 05/17/18,  Lungs-   clear to station bilaterally no wheezing, fair air movement CV- S1, S2 normal Abd-  +ve B.Sounds, Abd Soft, No tenderness,    Extremity-no clubbing cyanosis or edema Psych-affect is appropriate, oriented x3 Neuro-no new focal deficits, no tremors   CBC: Recent Labs  Lab 05/16/18 0236 05/17/18 0206 05/18/18 0800 05/19/18 1107 05/20/18 0156  WBC 7.4 7.4 8.3 7.9 8.0  HGB 8.2* 8.5* 8.8* 8.6* 7.7*  HCT 25.6* 26.5* 28.5* 27.5* 24.6*  MCV 86.2 86.6 88.2 88.4 89.1  PLT 268 283 321 281 637   Basic Metabolic Panel: Recent Labs  Lab 05/16/18 0236 05/17/18 0206 05/18/18 0317 05/19/18 0332 05/19/18 1100 05/20/18 0156 05/21/18 0600 05/22/18 0610  NA 141 140 139  --  139 139  --   --   K 3.6 3.6 4.3  --  3.7 3.8  --   --   CL 113* 111 108  --  105 103  --   --   CO2 19* 19* 21*  --  23 27  --   --   GLUCOSE 107* 103* 108*  --  98 95  --   --   BUN 56* 57* 59*  --  53* 42*  --   --   CREATININE 8.04* 8.15* 8.16*  --  7.34* 6.55*  --   --   CALCIUM 7.8* 7.9* 8.4*  --  8.2* 8.1*  --   --   MG 1.9 2.0 1.9 1.7  --  1.8 2.0 1.9  PHOS 6.5*  --   --   --  6.1*  --   --   --    GFR: Estimated Creatinine Clearance: 11 mL/min (A) (by C-G formula based on SCr of 6.55 mg/dL (H)). Liver Function Tests: Recent Labs  Lab 05/16/18 0236 05/19/18 1100  ALBUMIN 1.9* 1.8*   No results for input(s): LIPASE, AMYLASE in the last 168 hours. No results for input(s): AMMONIA in the last 168 hours. Coagulation Profile: Recent Labs  Lab 05/17/18 0206  INR 1.04   Cardiac Enzymes: No results for input(s): CKTOTAL, CKMB, CKMBINDEX, TROPONINI in the last 168 hours. BNP (last 3 results) No results for input(s): PROBNP in the last 8760 hours. HbA1C: No results for input(s): HGBA1C in the last 72 hours. CBG: No results for input(s): GLUCAP in the last 168 hours. Lipid Profile: No results for input(s): CHOL, HDL, LDLCALC, TRIG, CHOLHDL,  LDLDIRECT in the last 72 hours. Thyroid Function Tests: No results  for input(s): TSH, T4TOTAL, FREET4, T3FREE, THYROIDAB in the last 72 hours. Anemia Panel: No results for input(s): VITAMINB12, FOLATE, FERRITIN, TIBC, IRON, RETICCTPCT in the last 72 hours. Urine analysis:    Component Value Date/Time   COLORURINE YELLOW 05/11/2018 0606   APPEARANCEUR HAZY (A) 05/11/2018 0606   LABSPEC 1.012 05/11/2018 0606   PHURINE 5.0 05/11/2018 0606   GLUCOSEU NEGATIVE 05/11/2018 0606   GLUCOSEU NEG mg/dL 11/23/2006 2036   HGBUR MODERATE (A) 05/11/2018 0606   BILIRUBINUR NEGATIVE 05/11/2018 0606   KETONESUR NEGATIVE 05/11/2018 0606   PROTEINUR >=300 (A) 05/11/2018 0606   UROBILINOGEN 0.2 11/23/2006 2036   NITRITE NEGATIVE 05/11/2018 0606   LEUKOCYTESUR NEGATIVE 05/11/2018 0606   Sepsis Labs: '@LABRCNTIP' (procalcitonin:4,lacticidven:4)  ) No results found for this or any previous visit (from the past 240 hour(s)).  Radiology Studies: No results found.  Scheduled Meds: . amLODipine  10 mg Oral Daily  . aspirin  81 mg Oral Daily  . azithromycin  1,200 mg Oral Weekly  . calcium acetate  667 mg Oral TID WC  . carvedilol  25 mg Oral BID WC  . Chlorhexidine Gluconate Cloth  6 each Topical Q0600  . dapsone  100 mg Oral Daily  . darunavir-cobicistat  1 tablet Oral Q breakfast  . dolutegravir  50 mg Oral Q breakfast  . furosemide  40 mg Intravenous BID  . isosorbide-hydrALAZINE  1 tablet Oral TID  . rilpivirine  25 mg Oral Q breakfast  . senna-docusate  1 tablet Oral Daily  . sodium bicarbonate  1,300 mg Oral BID   Continuous Infusions: . sodium chloride Stopped (05/12/18 1526)     LOS: 12 days    Adebayo Ensminger A, MD [facility]    If 7PM-7AM, please contact night-coverage [email or web address] Password [facility] 05/22/2018, 9:38 AM     Patient ID: [id number] BRUHL, male   DOB: 08-04-1961, 57 y.o.   MRN: [id number]

## 2018-05-22 NOTE — Procedures (Signed)
I was present at this dialysis session. I have reviewed the session itself and made appropriate changes.   Filed Weights   05/20/18 1055 05/21/18 0500 05/22/18 0554  Weight: 76.3 kg (168 lb 3.4 oz) 76.7 kg (169 lb) 72.5 kg (159 lb 13.3 oz)    Recent Labs  Lab 05/19/18 1100 05/20/18 0156  NA 139 139  K 3.7 3.8  CL 105 103  CO2 23 27  GLUCOSE 98 95  BUN 53* 42*  CREATININE 7.34* 6.55*  CALCIUM 8.2* 8.1*  PHOS 6.1*  --     Recent Labs  Lab 05/18/18 0800 05/19/18 1107 05/20/18 0156  WBC 8.3 7.9 8.0  HGB 8.8* 8.6* 7.7*  HCT 28.5* 27.5* 24.6*  MCV 88.2 88.4 89.1  PLT 321 281 211    Scheduled Meds: . amLODipine  10 mg Oral Daily  . aspirin  81 mg Oral Daily  . azithromycin  1,200 mg Oral Weekly  . calcium acetate  667 mg Oral TID WC  . carvedilol  25 mg Oral BID WC  . Chlorhexidine Gluconate Cloth  6 each Topical Q0600  . dapsone  100 mg Oral Daily  . darunavir-cobicistat  1 tablet Oral Q breakfast  . dolutegravir  50 mg Oral Q breakfast  . furosemide  40 mg Intravenous BID  . isosorbide-hydrALAZINE  1 tablet Oral TID  . rilpivirine  25 mg Oral Q breakfast  . senna-docusate  1 tablet Oral Daily  . sodium bicarbonate  1,300 mg Oral BID   Continuous Infusions: . sodium chloride Stopped (05/12/18 1526)   PRN Meds:.sodium chloride, acetaminophen **OR** [DISCONTINUED] acetaminophen, bisacodyl, HYDROcodone-acetaminophen, labetalol, ondansetron **OR** ondansetron (ZOFRAN) IV, senna-docusate, traMADol    Assessment/Plan:  1. AKI/progressive CKD due to poorly controlled HTN and HIV, now ESRD. Creatinine continued to climb and [place] consistent with chronic medical renal disease. He started on HD 7/2/19s/p [facility] placement and is currently receiving his 3rd treatment. He will need longterm access placement and I haveconsultedVVS. He may regain some renal function with treatment of his HIV and HTN but for now will require ongoing HD. 1. Cont with TTS schedule 2. CLIP process  underway but will be difficult as he lives in [facility] and has not seen a nephrologist there before. 2. HIV- poorly controlled. Started on Tivicay, prezcobix, and RPV per ID. 3. Dilated CMP- UF with HD and cont with carvedilol and bidil per Cardiology 4. Uncontrolled HTN- improved with meds 5. Anemia of chronic disease- will start esa with HD and check iron stores 6. Severe protein malnutrition- consider protein supplements 7. Disposition- will be difficult if he lives in [facility] and has not had any nephrology care there compounded by his history on nonadherence with medications and follow up.     Donetta Potts,  MD 05/22/2018, 10:23 AM

## 2018-05-23 LAB — MAGNESIUM: Magnesium: 1.9 mg/dL (ref 1.7–2.4)

## 2018-05-23 MED ORDER — ZOLPIDEM TARTRATE 5 MG PO TABS
5.0000 mg | ORAL_TABLET | Freq: Every day | ORAL | Status: AC
  Administered 2018-05-23: 5 mg via ORAL
  Filled 2018-05-23: qty 1

## 2018-05-23 MED ORDER — DARBEPOETIN ALFA 60 MCG/0.3ML IJ SOSY: 60.0000 ug | PREFILLED_SYRINGE | INTRAMUSCULAR | Status: DC

## 2018-05-23 MED ORDER — VANCOMYCIN HCL IN DEXTROSE 1-5 GM/200ML-% IV SOLN
1000.0000 mg | INTRAVENOUS | Status: AC
  Administered 2018-05-24: 1000 mg via INTRAVENOUS
  Filled 2018-05-23 (×2): qty 200

## 2018-05-23 NOTE — Progress Notes (Signed)
Patient ID: [name], male   DOB: 12/13/1960, 57 y.o.   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 [name] on 05/10/2018 w/ right-sided chest pain with radiation to the right arm, nausea, and SOB after walking a short distance.  He also reported B LE swelling, paroxysmal nocturnal dyspnea, and bright red blood per rectum for a weeks duration.  Rt Neck/Chest tunneled HD catheter placed on 05/17/18, first HD session on 05/18/18.. CLIP process underway but will be difficult as he lives in [place] and has not seen a nephrologist there before.    Subjective:-Patient very frustrated today because multiple members of the staff come in to check on him.  He reports that he has had enough and does not want any more people coming into his room.  I have tried to explain to the patient that the nurses are just doing their job.  I have spoken to his nurse today to try to minimize interruptions.  He is threatening to leave AMA.  Plan:-  1)Acute kidney injury superimposed on progressive CKD V/ ESRD-   in the setting of uncontrolled hypertension and noncompliance with HIV medications (hypertensive nephropathy And HIV nephropathy)- creatinine was >  8,   -Discussed case with nephrology Dr. Bhandari/[name] ,   Rt Neck/Chest tunneled HD catheter placed on 05/17/18 (initiated on HD on 05/18/18), tolerated HD session on 05/20/18, patient now appears to be in ESRD according to Dr. Coladonato. Please see Nephrology progress note dated 05/21/2018 . cont with TTS schedule, CLIP process underway but will be difficult as he lives in [place] and has not seen a nephrologist there before. There significant concerns about patient's ability to be compliant with hemodialysis  , will need outpatient HD arranged for him in the [place] area where he plans to return home.  Vascular surgery has been consulted to help with permanent hemodialysis access placement and this will be done Monday July 8.  - Avoid nephrotoxic medication Recent Labs  Lab 05/17/18 0206 05/18/18 0317 05/19/18 1100 05/20/18 0156 05/22/18 0610  CREATININE 8.15* 8.16* 7.34* 6.55* 7.22*   2)HIV/AIDS- - Noncompliant with medication.- - HIV medications ( Dr [name] changed to a more renal based regimen of TIVICAY PREZCOBIX and RPV)  per ID,   CD4 count was 50,   ID consult appreciated,  HLA [name] ordered,  -Azithromycin 1200 mg weekly (q Tuesday), dapsone 100 mg weekly (q Thursday),  , -Prezcobix 800-150 mg daily, -Tivicay 50 mg daily, -Edurant 25 mg daily  Infectious disease service is signing off as of 05/16/2018   3)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 Hold off on statin due to HAART  and advanced renal disease.    Cardiology service is signing off as of 05/17/2018  4)Uncontrolled essential HTN- - improved BP control, multifactorial, but mostly due to noncompliance with medication, - c/n Norvasc 10 mg daily, Coreg twice daily, Lasix 40 mg IV twice daily, BiDil 1 tab 3 times daily   5)GI bleed/BRBPR- -  Currently patient refuses colonoscopy., -GI input appreciated, no further bleeding in the last several days.    GI signed off as of 05/16/2018  6)Normocytic anemia- -Anemia panel consistent with anemia of chronic disease.  Suspect a combination of AIDS +Renal disease does not appear to have significant GI bleed. ESA/Procrit as per nephrology team  7)Chronic boil of buttocks- -Hx of chronic "boil" of buttock, -I&D in ED.  Culture not sent   8)Polysubstance abuse-- 6/25 UDS positive marijuana  9)Goal of care -6/26 PALLIATIVE CARE: Noncompliant patient with HIV, acute systolic CHF, acute renal failure possibly going on HD discussed change of status to DNR, discussed short-term vs long-term goals of care  10)CAD--Myoview stress test from 05/14/2018 with reversible ischemia along the septal wall, cardiologist recommends medical management, concerns about early complications if left heart catheterization is done , also should angioplasty and stenting be done patient would not likely be compliant with dual antiplatelet therapy.  Cardiology service is signing off as of 05/17/2018  11)Disposition--- patient will need outpatient hemodialysis chair/spot assigned prior to discharge home , significant concerns about patient's ability to be compliant with hemodialysis  , will need outpatient HD arranged for him in the [facility] area where he plans to return home, Cont with TTS schedule, CLIP process underway but will be difficult as he lives in [facility] and has not seen a nephrologist there before.   DC telemetry and placed on MedSurg to try to minimize staff interactions with  patient.  DVT prophylaxis: SCD Code Status: Full Family Communication: None Disposition Plan: Dialysis access with vascular surgery will be done Monday  Consultants:  Cardiology ID Nephrology GI Palliative IR Rt Neck/Chest tunneled HD catheter placed on 05/17/18 Vascular surgery  Procedures/Significant Events:  6/25 echocardiogram:Left ventricle: LVEF =45% to 50%. Diffuse hypokinesis.- Grade 1   diastolic dysfunction. - Left atrium:  moderately dilated.  05/14/2018 abnormal Myoview stress test  Rt Neck/Chest tunneled HD catheter placed on 05/17/18,  Cultures 6/24 MRSA PCR negative  Antimicrobials: Anti-infectives (From admission, onward)   Start     Stop   05/12/18 0730  darunavir-cobicistat (PREZCOBIX) 800-150 MG per tablet 1 tablet         05/12/18 0730  dolutegravir (TIVICAY) tablet 50 mg         05/12/18 0730  rilpivirine (EDURANT) tablet 25 mg         05/12/18 0700  dolutegravir (TIVICAY) tablet 50 mg  Status:  Discontinued     05/12/18 0047   05/12/18 0700  rilpivirine (EDURANT) tablet 25 mg  Status:  Discontinued     05/12/18 0047   05/12/18 0700  darunavir-cobicistat (PREZCOBIX) 800-150 MG per tablet 1 tablet  Status:  Discontinued     05/12/18 0047   05/11/18 1015  azithromycin (ZITHROMAX) tablet 1,200 mg         05/11/18 1015  sulfamethoxazole-trimethoprim (BACTRIM,SEPTRA) 400-80 MG per tablet 1 tablet  Status:  Discontinued     05/11/18 1228   05/11/18 0800  elvitegravir-cobicistat-emtricitabine-tenofovir (GENVOYA) 150-150-200-10 MG tablet 1 tablet  Status:  Discontinued     05/10/18 1931   05/11/18 0800  darunavir (PREZISTA) tablet 800 mg  Status:  Discontinued     05/10/18 1931       Continuous Infusions: . sodium chloride Stopped (05/12/18 1526)  . [START ON 05/24/2018] vancomycin       Objective: Vitals:   05/22/18 1649 05/22/18 2320 05/23/18 0500 05/23/18 0733  BP: 132/75 125/66  (!) 146/69  Pulse: 78 70  80  Resp: 14 16  18  Temp: 98.5 F  (36.9 C) 98.7 F (37.1 C)  98.7 F (37.1 C)  TempSrc: Oral Oral  Oral  SpO2: 96% 94%  98%  Weight:   77.5 kg (170 lb 13.7 oz)   Height:        Intake/Output Summary (Last 24 hours) at 05/23/2018 0912 Last data filed at 05/22/2018 2200 Gross per 24 hour  Intake 287 ml  Output 2004 ml  Net -1717 ml   Filed Weights   05/22/18 1000 05/22/18 1345 05/23/18 0500  Weight: 72.5 kg (159 lb 13.3 oz) 70.5 kg (155 lb 6.8 oz) 77.5 kg (170 lb 13.7 oz)    Examination:  Physical Exam  Gen:- Awake Alert, in no apparent distress ambulating up in room without difficulty HEENT:- [place].AT, No sclera icterus Neck-Supple Neck,No JVD, Rt Neck/Chest tunneled HD catheter placed on 05/17/18,  Lungs-   clear to station bilaterally no wheezing, fair air movement CV- S1, S2 normal Abd-  +ve B.Sounds, Abd Soft, No tenderness,    Extremity-no clubbing cyanosis or edema Psych-affect is appropriate, oriented x3 Neuro-no new focal deficits, no tremors   CBC: Recent Labs  Lab 05/17/18 0206 05/18/18 0800 05/19/18 1107 05/20/18 0156 05/22/18 0610  WBC 7.4 8.3 7.9 8.0 9.5  HGB 8.5* 8.8* 8.6* 7.7* 8.4*  HCT 26.5* 28.5* 27.5* 24.6* 28.0*  MCV 86.6 88.2 88.4 89.1 91.8  PLT 283 321 281 211 239   Basic Metabolic Panel: Recent Labs  Lab 05/17/18 0206 05/18/18 0317 05/19/18 0332 05/19/18 1100 05/20/18 0156 05/21/18 0600 05/22/18 0610 05/23/18 0552  NA 140 139  --  139 139  --  138  --   K 3.6 4.3  --  3.7 3.8  --  4.5  --   CL 111 108  --  105 103  --  99  --   CO2 19* 21*  --  23 27  --  28  --   GLUCOSE 103* 108*  --  98 95  --  88  --   BUN 57* 59*  --  53* 42*  --  41*  --   CREATININE 8.15* 8.16*  --  7.34* 6.55*  --  7.22*  --   CALCIUM 7.9* 8.4*  --  8.2* 8.1*  --  8.6*  --   MG 2.0 1.9 1.7  --  1.8 2.0 1.9 1.9  PHOS  --   --   --  6.1*  --   --  5.3*  --    GFR: Estimated Creatinine Clearance: 11 mL/min (A) (by C-G formula based on SCr of 7.22 mg/dL (H)). Liver Function Tests: Recent   Labs   Lab 05/19/18 1100 05/22/18 0610  ALBUMIN 1.8* 2.1*   No results for input(s): LIPASE, AMYLASE in the last 168 hours. No results for input(s): AMMONIA in the last 168 hours. Coagulation Profile: Recent Labs  Lab 05/17/18 0206  INR 1.04   Cardiac Enzymes: No results for input(s): CKTOTAL, CKMB, CKMBINDEX, TROPONINI in the last 168 hours. BNP (last 3 results) No results for input(s): PROBNP in the last 8760 hours. HbA1C: No results for input(s): HGBA1C in the last 72 hours. CBG: No results for input(s): GLUCAP in the last 168 hours. Lipid Profile: No results for input(s): CHOL, HDL, LDLCALC, TRIG, CHOLHDL, LDLDIRECT in the last 72 hours. Thyroid Function Tests: No results for input(s): TSH, T4TOTAL, FREET4, T3FREE, THYROIDAB in the last 72 hours. Anemia Panel: No results for input(s): VITAMINB12, FOLATE, FERRITIN, TIBC, IRON, RETICCTPCT in the last 72 hours. Urine analysis:    Component Value Date/Time   COLORURINE YELLOW 05/11/2018 0606   APPEARANCEUR HAZY (A) 05/11/2018 0606   LABSPEC 1.012 05/11/2018 0606   PHURINE 5.0 05/11/2018 0606   GLUCOSEU NEGATIVE 05/11/2018 0606   GLUCOSEU NEG mg/dL 11/23/2006 2036   HGBUR MODERATE (A) 05/11/2018 0606   BILIRUBINUR NEGATIVE 05/11/2018 0606   KETONESUR NEGATIVE 05/11/2018 0606   PROTEINUR >=300 (A) 05/11/2018 0606   UROBILINOGEN 0.2 11/23/2006 2036   NITRITE NEGATIVE 05/11/2018 0606   LEUKOCYTESUR NEGATIVE 05/11/2018 0606   Sepsis Labs: @LABRCNTIP(procalcitonin:4,lacticidven:4)  ) No results found for this or any previous visit (from the past 240 hour(s)).  Radiology Studies: No results found.  Scheduled Meds: . amLODipine  10 mg Oral Daily  . aspirin  81 mg Oral Daily  . azithromycin  1,200 mg Oral Weekly  . calcium acetate  667 mg Oral TID WC  . carvedilol  25 mg Oral BID WC  . Chlorhexidine Gluconate Cloth  6 each Topical Q0600  . dapsone  100 mg Oral Daily  . darunavir-cobicistat  1 tablet Oral Q breakfast  .  dolutegravir  50 mg Oral Q breakfast  . furosemide  40 mg Intravenous BID  . isosorbide-hydrALAZINE  1 tablet Oral TID  . lactulose  20 g Oral TID  . rilpivirine  25 mg Oral Q breakfast  . senna-docusate  1 tablet Oral Daily  . sodium bicarbonate  1,300 mg Oral BID   Continuous Infusions: . sodium chloride Stopped (05/12/18 1526)  . [START ON 05/24/2018] vancomycin       LOS: 13 days    , A, MD [facility]    If 7PM-7AM, please contact night-coverage [email or web address] Password [email or web address] 05/23/2018, 9:12 AM     Patient ID: [name], male   DOB: 03/11/1961, 56 y.o.   MRN: [id number]  

## 2018-05-23 NOTE — Progress Notes (Signed)
   VASCULAR SURGERY ASSESSMENT & PLAN:   For left AVF tomorrow by Dr. Donnetta Hutching. Discussed with patient. All questions answered.  Preop orders written.   HGB was 8.4 yesterday. Will recheck in AM and also check K  NEPHROLOGY: He as dialyzed yesterday, so I assume that he will not be dialyzed again until Tuesday. Please be sure that he is not on for HD tomorrow since he is scheduled for surgery. Thanks.   SUBJECTIVE:   No complaints  PHYSICAL EXAM:   Vitals:   05/22/18 1649 05/22/18 2320 05/23/18 0500 05/23/18 0733  BP: 132/75 125/66  (!) 146/69  Pulse: 78 70  80  Resp: 14 16  18   Temp: 98.5 F (36.9 C) 98.7 F (37.1 C)  98.7 F (37.1 C)  TempSrc: Oral Oral  Oral  SpO2: 96% 94%  98%  Weight:   170 lb 13.7 oz (77.5 kg)   Height:       Palpable left radial pulse.   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 amLODipine  10 mg Oral Daily  . aspirin  81 mg Oral Daily  . azithromycin  1,200 mg Oral Weekly  . calcium acetate  667 mg Oral TID WC  . carvedilol  25 mg Oral BID WC  . Chlorhexidine Gluconate Cloth  6 each Topical Q0600  . dapsone  100 mg Oral Daily  . darunavir-cobicistat  1 tablet Oral Q  breakfast  . dolutegravir  50 mg Oral Q breakfast  . furosemide  40 mg Intravenous BID  . isosorbide-hydrALAZINE  1 tablet Oral TID  . lactulose  20 g Oral TID  . rilpivirine  25 mg Oral Q breakfast  . senna-docusate  1 tablet Oral Daily  . sodium bicarbonate  1,300 mg Oral BID    Deitra Mayo Beeper: [phone] Office: [phone] 05/23/2018

## 2018-05-23 NOTE — H&P (View-Only) (Signed)
   VASCULAR SURGERY ASSESSMENT & PLAN:   For left AVF tomorrow by Dr. Donnetta Hutching. Discussed with patient. All questions answered.  Preop orders written.   HGB was 8.4 yesterday. Will recheck in AM and also check K  NEPHROLOGY: He as dialyzed yesterday, so I assume that he will not be dialyzed again until Tuesday. Please be sure that he is not on for HD tomorrow since he is scheduled for surgery. Thanks.   SUBJECTIVE:   No complaints  PHYSICAL EXAM:   Vitals:   05/22/18 1649 05/22/18 2320 05/23/18 0500 05/23/18 0733  BP: 132/75 125/66  (!) 146/69  Pulse: 78 70  80  Resp: 14 16  18   Temp: 98.5 F (36.9 C) 98.7 F (37.1 C)  98.7 F (37.1 C)  TempSrc: Oral Oral  Oral  SpO2: 96% 94%  98%  Weight:   170 lb 13.7 oz (77.5 kg)   Height:       Palpable left radial pulse.   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 amLODipine  10 mg Oral Daily  . aspirin  81 mg Oral Daily  . azithromycin  1,200 mg Oral Weekly  . calcium acetate  667 mg Oral TID WC  . carvedilol  25 mg Oral BID WC  . Chlorhexidine Gluconate Cloth  6 each Topical Q0600  . dapsone  100 mg Oral Daily  . darunavir-cobicistat  1 tablet Oral Q  breakfast  . dolutegravir  50 mg Oral Q breakfast  . furosemide  40 mg Intravenous BID  . isosorbide-hydrALAZINE  1 tablet Oral TID  . lactulose  20 g Oral TID  . rilpivirine  25 mg Oral Q breakfast  . senna-docusate  1 tablet Oral Daily  . sodium bicarbonate  1,300 mg Oral BID    Deitra Mayo Beeper: [phone] Office: [phone] 05/23/2018

## 2018-05-23 NOTE — Progress Notes (Addendum)
S: "I didn't sleep well last night, they were always coming in to draw blood". O:BP (!) 146/69 (BP Location: Right Leg)   Pulse 80   Temp 98.7 F (37.1 C) (Oral)   Resp 18   Ht 5\' 5"  (1.651 m)   Wt 77.5 kg (170 lb 13.7 oz)   SpO2 98%   BMI 28.43 kg/m   Intake/Output Summary (Last 24 hours) at 05/23/2018 1158 Last data filed at 05/23/2018 0940 Gross per 24 hour  Intake 527 ml  Output 2004 ml  Net -1477 ml   Intake/Output: I/O last 3 completed shifts: In: 763 [P.O.:763] Out: 2004 [Other:2000; Stool:4]  Intake/Output this shift:  Total I/O In: 240 [P.O.:240] Out: -  Weight change: 0 kg (0 lb) Gen: NAD CVS: no rub Resp: cta Abd: benign Ext: trace edema, RIJ [facility]  Recent Labs  Lab 05/17/18 0206 05/18/18 0317 05/19/18 1100 05/20/18 0156 05/22/18 0610  NA 140 139 139 139 138  K 3.6 4.3 3.7 3.8 4.5  CL 111 108 105 103 99  CO2 19* 21* 23 27 28   GLUCOSE 103* 108* 98 95 88  BUN 57* 59* 53* 42* 41*  CREATININE 8.15* 8.16* 7.34* 6.55* 7.22*  ALBUMIN  --   --  1.8*  --  2.1*  CALCIUM 7.9* 8.4* 8.2* 8.1* 8.6*  PHOS  --   --  6.1*  --  5.3*   Liver Function Tests: Recent Labs  Lab 05/19/18 1100 05/22/18 0610  ALBUMIN 1.8* 2.1*   No results for input(s): LIPASE, AMYLASE in the last 168 hours. No results for input(s): AMMONIA in the last 168 hours. CBC: Recent Labs  Lab 05/17/18 0206 05/18/18 0800 05/19/18 1107 05/20/18 0156 05/22/18 0610  WBC 7.4 8.3 7.9 8.0 9.5  HGB 8.5* 8.8* 8.6* 7.7* 8.4*  HCT 26.5* 28.5* 27.5* 24.6* 28.0*  MCV 86.6 88.2 88.4 89.1 91.8  PLT 283 321 281 211 239   Cardiac Enzymes: No results for input(s): CKTOTAL, CKMB, CKMBINDEX, TROPONINI in the last 168 hours. CBG: No results for input(s): GLUCAP in the last 168 hours.  Iron Studies: No results for input(s): IRON, TIBC, TRANSFERRIN, FERRITIN in the last 72 hours. Studies/Results: No results found. Marland Kitchen amLODipine  10 mg Oral Daily  . aspirin  81 mg Oral Daily  . azithromycin  1,200 mg  Oral Weekly  . calcium acetate  667 mg Oral TID WC  . carvedilol  25 mg Oral BID WC  . Chlorhexidine Gluconate Cloth  6 each Topical Q0600  . dapsone  100 mg Oral Daily  . darunavir-cobicistat  1 tablet Oral Q breakfast  . dolutegravir  50 mg Oral Q breakfast  . furosemide  40 mg Intravenous BID  . isosorbide-hydrALAZINE  1 tablet Oral TID  . lactulose  20 g Oral TID  . rilpivirine  25 mg Oral Q breakfast  . senna-docusate  1 tablet Oral Daily  . sodium bicarbonate  1,300 mg Oral BID    BMET    Component Value Date/Time   NA 138 05/22/2018 0610   K 4.5 05/22/2018 0610   CL 99 05/22/2018 0610   CO2 28 05/22/2018 0610   GLUCOSE 88 05/22/2018 0610   BUN 41 (H) 05/22/2018 0610   CREATININE 7.22 (H) 05/22/2018 0610   CREATININE 0.67 (L) 04/23/2017 1728   CALCIUM 8.6 (L) 05/22/2018 0610   CALCIUM 7.3 (L) 05/13/2018 0241   GFRNONAA 8 (L) 05/22/2018 0610   GFRNONAA >89 09/14/2014 0947   GFRAA 9 (L)  05/22/2018 0610   GFRAA >89 09/14/2014 0947   CBC    Component Value Date/Time   WBC 9.5 05/22/2018 0610   RBC 3.05 (L) 05/22/2018 0610   HGB 8.4 (L) 05/22/2018 0610   HGB 8.9 (L) 05/12/2018 1038   HGB 13.7 11/20/2008 1531   HCT 28.0 (L) 05/22/2018 0610   HCT 40.5 11/20/2008 1531   PLT 239 05/22/2018 0610   PLT 172 11/20/2008 1531   MCV 91.8 05/22/2018 0610   MCV 90.7 11/20/2008 1531   MCH 27.5 05/22/2018 0610   MCHC 30.0 05/22/2018 0610   RDW 15.9 (H) 05/22/2018 0610   RDW 15.0 (H) 11/20/2008 1531   LYMPHSABS 1,054 04/23/2017 1728   LYMPHSABS 1.5 11/20/2008 1531   MONOABS 465 04/23/2017 1728   MONOABS 0.3 11/20/2008 1531   EOSABS 93 04/23/2017 1728   EOSABS 0.1 11/20/2008 1531   BASOSABS 31 04/23/2017 1728   BASOSABS 0.0 11/20/2008 1531      Assessment/Plan:  1. AKI/progressive CKD due to poorly controlled HTN and HIV, now ESRD. Creatinine continued to climb and [place] consistent with chronic medical renal disease. He started on HD 7/2/19s/p [facility] placement and is  currently receiving his 3rd treatment. He will need longterm access placement and I haveconsultedVVS. He may regain some renal function with treatment of his HIV and HTN but for now will require ongoingHD and remains anuric/oliguric. 1. Cont with TTS schedule 2. CLIP process underway but will be difficult as he lives in [facility] and has not seen a nephrologist there before. 2. HIV- poorly controlled. Started on Tivicay, prezcobix, and RPV per ID. 3. Dilated CMP- UF with HD and cont with carvedilol and bidil per Cardiology 4. Uncontrolled HTN- improved with meds 5. Anemia of chronic disease- will start esa with HD, TSAT 44% 6. Vascular access- RIJ TDC, and VVS to place avf/avg tomorrow. 7. Severe protein malnutrition- consider protein supplements 8. Disposition- will be difficult if he lives in [facility] and has not had any nephrology care there compounded by his history on nonadherence with medications and follow up.   Donetta Potts, MD [employer] [phone]

## 2018-05-23 NOTE — Progress Notes (Signed)
Received call from central tele, pt off monitor.  Pt has taken himself off tele box to "wash up" despite instructions to wait for assistance.  Standing at sink upon entering room with curtain pulled and asked for privacy.  Will closely monitor.

## 2018-05-24 ENCOUNTER — Encounter (HOSPITAL_COMMUNITY): Payer: Self-pay

## 2018-05-24 ENCOUNTER — Inpatient Hospital Stay (HOSPITAL_COMMUNITY): Payer: Medicare Other | Admitting: Certified Registered Nurse Anesthetist

## 2018-05-24 ENCOUNTER — Encounter (HOSPITAL_COMMUNITY)
Admission: EM | Disposition: A | Discharge status: Home / Self Care | Payer: Self-pay | Source: Home / Self Care | Attending: Family Medicine

## 2018-05-24 HISTORY — PX: BASCILIC VEIN TRANSPOSITION: SHX5742

## 2018-05-24 LAB — RENAL FUNCTION PANEL
ALBUMIN: 2 g/dL — AB (ref 3.5–5.0)
ANION GAP: 10 (ref 5–15)
BUN: 36 mg/dL — ABNORMAL HIGH (ref 6–20)
CALCIUM: 8.5 mg/dL — AB (ref 8.9–10.3)
CO2: 29 mmol/L (ref 22–32)
CREATININE: 6.79 mg/dL — AB (ref 0.61–1.24)
Chloride: 102 mmol/L (ref 98–111)
GFR calc non Af Amer: 8 mL/min — ABNORMAL LOW (ref >60)
GFR, EST AFRICAN AMERICAN: 9 mL/min — AB (ref >60)
GLUCOSE: 94 mg/dL (ref 70–99)
PHOSPHORUS: 6.3 mg/dL — AB (ref 2.5–4.6)
Potassium: 4 mmol/L (ref 3.5–5.1)
SODIUM: 141 mmol/L (ref 135–145)

## 2018-05-24 LAB — CBC
HCT: 25.6 % — ABNORMAL LOW (ref 39.0–52.0)
HEMOGLOBIN: 7.9 g/dL — AB (ref 13.0–17.0)
MCH: 27.5 pg (ref 26.0–34.0)
MCHC: 30.9 g/dL (ref 30.0–36.0)
MCV: 89.2 fL (ref 78.0–100.0)
PLATELETS: 197 10*3/uL (ref 150–400)
RBC: 2.87 MIL/uL — AB (ref 4.22–5.81)
RDW: 15.4 % (ref 11.5–15.5)
WBC: 8.3 10*3/uL (ref 4.0–10.5)

## 2018-05-24 LAB — MAGNESIUM: MAGNESIUM: 1.8 mg/dL (ref 1.7–2.4)

## 2018-05-24 LAB — GLUCOSE, CAPILLARY: GLUCOSE-CAPILLARY: 83 mg/dL (ref 70–99)

## 2018-05-24 SURGERY — TRANSPOSITION, VEIN, BASILIC
Anesthesia: Monitor Anesthesia Care | Site: Arm Upper | Laterality: Left

## 2018-05-24 MED ORDER — PROPOFOL 10 MG/ML IV BOLUS
INTRAVENOUS | Status: DC | PRN
  Administered 2018-05-24: 30 mg via INTRAVENOUS
  Administered 2018-05-24: 40 mg via INTRAVENOUS
  Administered 2018-05-24: 30 mg via INTRAVENOUS

## 2018-05-24 MED ORDER — SODIUM CHLORIDE 0.9 % IV SOLN
INTRAVENOUS | Status: AC
  Filled 2018-05-24: qty 1.2

## 2018-05-24 MED ORDER — AMLODIPINE BESYLATE 10 MG PO TABS
10.0000 mg | ORAL_TABLET | Freq: Every day | ORAL | Status: DC
  Administered 2018-05-25: 10 mg via ORAL
  Filled 2018-05-24 (×2): qty 1

## 2018-05-24 MED ORDER — OXYCODONE HCL 5 MG PO TABS
ORAL_TABLET | ORAL | Status: AC
  Filled 2018-05-24: qty 1

## 2018-05-24 MED ORDER — PHENYLEPHRINE HCL 10 MG/ML IJ SOLN
INTRAVENOUS | Status: DC | PRN
  Administered 2018-05-24: 50 ug/min via INTRAVENOUS

## 2018-05-24 MED ORDER — LIDOCAINE-EPINEPHRINE 0.5 %-1:200000 IJ SOLN
INTRAMUSCULAR | Status: AC
  Filled 2018-05-24: qty 1

## 2018-05-24 MED ORDER — SODIUM CHLORIDE 0.9 % IV SOLN
INTRAVENOUS | Status: DC | PRN
  Administered 2018-05-24: 14:00:00

## 2018-05-24 MED ORDER — SODIUM CHLORIDE 0.9 % IV SOLN
INTRAVENOUS | Status: DC | PRN
  Administered 2018-05-24: 13:00:00 via INTRAVENOUS

## 2018-05-24 MED ORDER — SODIUM CHLORIDE 0.9 % IV SOLN
INTRAVENOUS | Status: DC
Start: 2018-05-24 — End: 2018-05-24

## 2018-05-24 MED ORDER — PROPOFOL 500 MG/50ML IV EMUL
INTRAVENOUS | Status: DC | PRN
  Administered 2018-05-24: 100 ug/kg/min via INTRAVENOUS

## 2018-05-24 MED ORDER — ONDANSETRON HCL 4 MG/2ML IJ SOLN
INTRAMUSCULAR | Status: AC
  Filled 2018-05-24: qty 2

## 2018-05-24 MED ORDER — CALCIUM ACETATE (PHOS BINDER) 667 MG PO CAPS
2001.0000 mg | ORAL_CAPSULE | Freq: Three times a day (TID) | ORAL | Status: DC
  Administered 2018-05-25 – 2018-05-26 (×5): 2001 mg via ORAL
  Filled 2018-05-24 (×5): qty 3

## 2018-05-24 MED ORDER — DARBEPOETIN ALFA 200 MCG/0.4ML IJ SOSY
200.0000 ug | PREFILLED_SYRINGE | INTRAMUSCULAR | Status: DC
  Administered 2018-05-25: 200 ug via INTRAVENOUS
  Filled 2018-05-24: qty 0.4

## 2018-05-24 MED ORDER — LACTATED RINGERS IV SOLN: INTRAVENOUS | Status: DC

## 2018-05-24 MED ORDER — CHLORHEXIDINE GLUCONATE CLOTH 2 % EX PADS
6.0000 | MEDICATED_PAD | Freq: Every day | CUTANEOUS | Status: DC
  Administered 2018-05-25 – 2018-05-26 (×2): 6 via TOPICAL

## 2018-05-24 MED ORDER — ACETAMINOPHEN 10 MG/ML IV SOLN
INTRAVENOUS | Status: DC | PRN
  Administered 2018-05-24: 1000 mg via INTRAVENOUS

## 2018-05-24 MED ORDER — 0.9 % SODIUM CHLORIDE (POUR BTL) OPTIME
TOPICAL | Status: DC | PRN
  Administered 2018-05-24: 1000 mL

## 2018-05-24 MED ORDER — CALCIUM ACETATE (PHOS BINDER) 667 MG PO CAPS
2001.0000 mg | ORAL_CAPSULE | Freq: Three times a day (TID) | ORAL | Status: DC
  Administered 2018-05-24: 2001 mg via ORAL
  Filled 2018-05-24: qty 3

## 2018-05-24 MED ORDER — OXYCODONE HCL 5 MG PO TABS
5.0000 mg | ORAL_TABLET | Freq: Once | ORAL | Status: AC | PRN
  Administered 2018-05-24: 5 mg via ORAL

## 2018-05-24 MED ORDER — TRAMADOL HCL 50 MG PO TABS
ORAL_TABLET | ORAL | Status: AC
  Filled 2018-05-24: qty 1

## 2018-05-24 MED ORDER — FENTANYL CITRATE (PF) 250 MCG/5ML IJ SOLN
INTRAMUSCULAR | Status: AC
  Filled 2018-05-24: qty 5

## 2018-05-24 MED ORDER — LIDOCAINE-EPINEPHRINE 0.5 %-1:200000 IJ SOLN
INTRAMUSCULAR | Status: DC | PRN
  Administered 2018-05-24: 11 mL

## 2018-05-24 MED ORDER — RILPIVIRINE HCL 25 MG PO TABS
25.0000 mg | ORAL_TABLET | Freq: Every day | ORAL | Status: DC
  Administered 2018-05-25 – 2018-05-26 (×2): 25 mg via ORAL
  Filled 2018-05-24 (×2): qty 1

## 2018-05-24 SURGICAL SUPPLY — 29 items
ADH SKN CLS APL DERMABOND .7 (GAUZE/BANDAGES/DRESSINGS) ×2
ARMBAND PINK RESTRICT EXTREMIT (MISCELLANEOUS) ×4 IMPLANT
CANISTER SUCT 3000ML PPV (MISCELLANEOUS) ×3 IMPLANT
CANNULA VESSEL 3MM 2 BLNT TIP (CANNULA) ×3 IMPLANT
CLIP LIGATING EXTRA MED SLVR (CLIP) ×1 IMPLANT
CLIP LIGATING EXTRA SM BLUE (MISCELLANEOUS) ×1 IMPLANT
CLIP VESOCCLUDE MED 6/CT (CLIP) ×2 IMPLANT
CLIP VESOCCLUDE SM WIDE 6/CT (CLIP) ×4 IMPLANT
COVER PROBE W GEL 5X96 (DRAPES) ×3 IMPLANT
DECANTER SPIKE VIAL GLASS SM (MISCELLANEOUS) ×1 IMPLANT
DERMABOND ADVANCED (GAUZE/BANDAGES/DRESSINGS) ×1
DERMABOND ADVANCED .7 DNX12 (GAUZE/BANDAGES/DRESSINGS) ×2 IMPLANT
ELECT REM PT RETURN 9FT ADLT (ELECTROSURGICAL) ×3
ELECTRODE REM PT RTRN 9FT ADLT (ELECTROSURGICAL) ×2 IMPLANT
GLOVE SS BIOGEL STRL SZ 7.5 (GLOVE) ×2 IMPLANT
GLOVE SUPERSENSE BIOGEL SZ 7.5 (GLOVE) ×1
GOWN STRL REUS W/ TWL LRG LVL3 (GOWN DISPOSABLE) ×6 IMPLANT
GOWN STRL REUS W/TWL LRG LVL3 (GOWN DISPOSABLE) ×9
KIT BASIN OR (CUSTOM PROCEDURE TRAY) ×3 IMPLANT
KIT TURNOVER KIT B (KITS) ×3 IMPLANT
NS IRRIG 1000ML POUR BTL (IV SOLUTION) ×3 IMPLANT
PACK CV ACCESS (CUSTOM PROCEDURE TRAY) ×3 IMPLANT
PAD ARMBOARD 7.5X6 YLW CONV (MISCELLANEOUS) ×6 IMPLANT
SUT PROLENE 6 0 CC (SUTURE) ×3 IMPLANT
SUT VIC AB 3-0 SH 27 (SUTURE) ×3
SUT VIC AB 3-0 SH 27X BRD (SUTURE) ×2 IMPLANT
TOWEL GREEN STERILE (TOWEL DISPOSABLE) ×3 IMPLANT
UNDERPAD 30X30 (UNDERPADS AND DIAPERS) ×3 IMPLANT
WATER STERILE IRR 1000ML POUR (IV SOLUTION) ×3 IMPLANT

## 2018-05-24 NOTE — Interval H&P Note (Signed)
History and Physical Interval Note:  05/24/2018 1:25 PM  [name]  has presented today for surgery, with the diagnosis of end stage renal disease  The various methods of treatment have been discussed with the patient and family. After consideration of risks, benefits and other options for treatment, the patient has consented to  Procedure(s): ARTERIOVENOUS (AV) FISTULA CREATION ARM (Left) as a surgical intervention .  The patient's history has been reviewed, patient examined, no change in status, stable for surgery.  I have reviewed the patient's chart and labs.  Questions were answered to the patient's satisfaction.     Curt Jews

## 2018-05-24 NOTE — Telephone Encounter (Signed)
Left message for patient to call and schedule.

## 2018-05-24 NOTE — Op Note (Signed)
    OPERATIVE REPORT  DATE OF SURGERY: 05/24/2018  PATIENT: [name], 57 y.o. male MRN: [id number]  DOB: 09-Nov-1961  PRE-OPERATIVE DIAGNOSIS: End-stage renal disease  POST-OPERATIVE DIAGNOSIS:  Same  PROCEDURE: Left first stage brachiobasilic fistula  SURGEON:  Curt Jews, M.D.  PHYSICIAN ASSISTANT: Liana Crocker, PA-C  ANESTHESIA: MAC  EBL: Minimal ml  Total I/O In: 150 [I.V.:150] Out: 5 [Blood:5]  BLOOD ADMINISTERED: None  DRAINS: None  SPECIMEN: None  COUNTS CORRECT:  YES  PLAN OF CARE: PACU  PATIENT DISPOSITION:  PACU - hemodynamically stable  PROCEDURE DETAILS: Patient was taken to the operative placed supine position where the area of the left arm was prepped and draped in usual sterile fashion.  SonoSite ultrasound was used to visualize the veins.  The patient had extremely small cephalic vein throughout his forearm arm and upper arm.  He did have a moderate sized basilic vein in the mid upper arm that communicated [name] on to the deep vein as well.  He did not then had several different branches above the elbow.  The largest of these was chosen.  An incision was made using local anesthesia over the antecubital space and carried down through the subcutaneous tissue to the vein.  Tributary branches were ligated and divided.  The vein was ligated distally and divided and was gently dilated and was felt to be adequate size for fistula attempt.  The brachial artery was exposed to the same incision.  The artery was of good caliber.  The artery was occluded proximally and distally and was opened with an 11 blade and sent mostly with Potts scissors.  The vein was cut to the appropriate length and was spatulated and sewn end-to-side to the artery with a running 6-0 Prolene suture.  Clamps were removed and excellent thrill was noted.  Wounds irrigated with saline.  Hemostasis obtained with cautery.  Wounds were closed with 3-0 Vicryl in the subcutaneous and subcuticular  tissue.  Patient was transferred to the recovery room in stable condition   Rosetta Posner, M.D., [facility] 05/24/2018 4:50 PM

## 2018-05-24 NOTE — Progress Notes (Signed)
Patient was gruff this morning and upset about being NPO for procedure today.  He went to the OR approximately 1000 for the procedure and refused surgery and arrived back to the unit approximately 1100.  The OR called an hour later stating that a doctor had spoken with the patient and he did agree to the procedure.  The patient arrived back around 1500 after his procedure.  Report received from [facility] from PACU.  The patient has been much more pleasant after the procedure and has eaten and taken his daily medications.  Upon administering meds, we reviewed what meds he was taking and he admitted he has not been taking any  Medications regularly at home.  He wants to be more active in his care and is interested in taking his medications as prescribed.  We talked about including possibility of [facility] for medication teaching as well as utilizing the dialysis center for teaching of his medications.

## 2018-05-24 NOTE — Anesthesia Preprocedure Evaluation (Addendum)
Anesthesia Evaluation  Patient identified by MRN, date of birth, ID band Patient awake    Reviewed: Allergy & Precautions, NPO status , Patient's Chart, lab work & pertinent test results  Airway Mallampati: II  TM Distance: >3 FB Neck ROM: Full    Dental no notable dental hx.    Pulmonary neg pulmonary ROS, former smoker,    Pulmonary exam normal breath sounds clear to auscultation       Cardiovascular hypertension, Pt. on medications +CHF  Normal cardiovascular exam Rhythm:Regular Rate:Normal     Neuro/Psych negative neurological ROS  negative psych ROS   GI/Hepatic negative GI ROS, Neg liver ROS,   Endo/Other  negative endocrine ROS  Renal/GU Renal Insufficiency and ESRFRenal disease     Musculoskeletal negative musculoskeletal ROS (+)   Abdominal   Peds  Hematology negative hematology ROS (+) anemia ,   Anesthesia Other Findings   Reproductive/Obstetrics negative OB ROS                            Anesthesia Physical Anesthesia Plan  ASA: III  Anesthesia Plan: MAC   Post-op Pain Management:    Induction: Intravenous  PONV Risk Score and Plan: Ondansetron and Propofol infusion  Airway Management Planned: Simple Face Mask  Additional Equipment:   Intra-op Plan:   Post-operative Plan:   Informed Consent: I have reviewed the patients History and Physical, chart, labs and discussed the procedure including the risks, benefits and alternatives for the proposed anesthesia with the patient or authorized representative who has indicated his/her understanding and acceptance.   Dental advisory given  Plan Discussed with: CRNA  Anesthesia Plan Comments: (Pt refuses to take beta blocker today)       Anesthesia Quick Evaluation

## 2018-05-24 NOTE — Progress Notes (Signed)
Subjective: Interval History: has complaints Gas pains.  Objective: Vital signs in last 24 hours: Temp:  [98.8 F (37.1 C)-99.1 F (37.3 C)] 98.9 F (37.2 C) (07/08 0834) Pulse Rate:  [65-90] 79 (07/08 0834) Resp:  [16-20] 20 (07/08 0834) BP: (121-154)/(64-91) 141/76 (07/08 0834) SpO2:  [94 %-100 %] 94 % (07/08 0834) Weight:  [77.5 kg (170 lb 13.7 oz)-78.4 kg (172 lb 13.5 oz)] 78.4 kg (172 lb 13.5 oz) (07/08 0500) Weight change: 5 kg (11 lb 0.4 oz)  Intake/Output from previous day: 07/07 0701 - 07/08 0700 In: 240 [P.O.:240] Out: -  Intake/Output this shift: No intake/output data recorded.  General appearance: alert, cooperative and no distress Resp: diminished breath sounds bilaterally Chest wall: RIJ PC Cardio: S1, S2 normal and systolic murmur: systolic ejection 2/6, decrescendo at 2nd left intercostal space GI: liver down 4 cm, pos bs, soft Extremities: edema 1+  Lab Results: Recent Labs    05/22/18 0610 05/24/18 0754  WBC 9.5 8.3  HGB 8.4* 7.9*  HCT 28.0* 25.6*  PLT 239 197   BMET:  Recent Labs    05/22/18 0610 05/24/18 0754  NA 138 141  K 4.5 4.0  CL 99 102  CO2 28 29  GLUCOSE 88 94  BUN 41* 36*  CREATININE 7.22* 6.79*  CALCIUM 8.6* 8.5*   No results for input(s): PTH in the last 72 hours. Iron Studies: No results for input(s): IRON, TIBC, TRANSFERRIN, FERRITIN in the last 72 hours.  Studies/Results: No results found.  I have reviewed the patient's current medications.  Assessment/Plan: 1 ESRD will do HD tomorrow,.  For access today.  CLIP in process 2 HTN lower vol , lower meds 3 HIV 4 Anemia esa 5 HPTH vit D 6 substance abuse 7 NONADHERENCE P Access, Hd, esa, vit D, lower vol    LOS: 14 days   Tamina Cyphers 05/24/2018,9:37 AM

## 2018-05-24 NOTE — Progress Notes (Signed)
Patient ID: [name], male   DOB: Aug 23, 1961, 57 y.o.   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, face, and neck ([place])   Hypertension   Chest pain   Tobacco use   Furuncle of gluteal region   CHF (congestive heart failure) (HCC)   Hypertensive urgency   GIB (gastrointestinal bleeding)   AKI (acute kidney injury) ([place])   Heme + stool   Normocytic anemia   Pain of both hip joints   Goals of care, counseling/discussion   Palliative care by specialist  Brief Narrative:  [name] PMHx  HIV with CD4 count of 29, 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  He also reported B LE swelling, paroxysmal nocturnal dyspnea, and bright red blood per rectum for a weeks duration.  Rt Neck/Chest tunneled HD catheter placed on 05/17/18, first HD session on 05/18/18.Marland Kitchen CLIP process underway but will be difficult as he lives in [facility] and has not seen a nephrologist there before.    Subjective:-Patient went down to the OR this morning to get his AV fistula but then he refused so he was sent back up to his room.  Apparently he is considering again to get the procedure done today.    Plan:-  1)Acute kidney injury superimposed on progressive CKD V/ ESRD-   in the setting of uncontrolled hypertension and noncompliance with HIV medications (hypertensive nephropathy And HIV nephropathy)- creatinine was >  8,  -Discussed case with nephrology Dr. Blake Divine ,   Rt Neck/Chest tunneled HD catheter placed on 05/17/18 (initiated on HD on 05/18/18), tolerated HD session on 05/20/18,  patient now appears to be in ESRD according to Dr. Marval Regal. Please see Nephrology progress note dated 05/21/2018 . cont with TTS schedule, CLIP process underway but will be difficult as he lives in [facility] and has not seen a nephrologist there before. There significant concerns about patient's ability to be compliant with hemodialysis  , will need outpatient HD arranged for him in the [place] area where he plans to return home.  Vascular surgery has been consulted to help with permanent hemodialysis access placement and this will be done Monday July 8.  Patient cannot be discharged to has permanent access addressed.  - Avoid nephrotoxic medication Recent Labs  Lab 05/18/18 0317 05/19/18 1100 05/20/18 0156 05/22/18 0610 05/24/18 0754  CREATININE 8.16* 7.34* 6.55* 7.22* 6.79*   2)HIV/AIDS- - Noncompliant with medication.- - HIV medications ( Dr Tommy Medal changed to a more renal based regimen of TIVICAY PREZCOBIX and RPV)  per ID,   CD4 count was 50,   ID consult appreciated,  HLA B701 ordered,  -Azithromycin 1200 mg weekly (q Tuesday), dapsone 100 mg weekly (q Thursday),  , -Prezcobix 800-150 mg daily, -Tivicay 50 mg daily, -Edurant 25 mg daily  Infectious disease service is signing off as of 05/16/2018   3)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 Hold off on statin due to HAART and advanced renal disease.    Cardiology service is signing off as of 05/17/2018  4)Uncontrolled  essential HTN- - improved BP control, multifactorial, but mostly due to noncompliance with medication, - c/n Norvasc 10 mg daily, Coreg twice daily, Lasix 40 mg IV twice daily, BiDil 1 tab 3 times daily   5)GI bleed/BRBPR- -  Currently patient refuses colonoscopy., -GI input appreciated, no further bleeding in the last several days.    GI signed off as of 05/16/2018  6)Normocytic anemia- -Anemia panel consistent with anemia of chronic disease.  Suspect a combination of AIDS +Renal disease does not appear to have significant GI bleed. ESA/Procrit as per nephrology team  7)Chronic boil of buttocks- -Hx of chronic "boil" of buttock, -I&D in ED.  Culture not sent   8)Polysubstance abuse-- 6/25 UDS positive marijuana  9)Goal of care -6/26 PALLIATIVE CARE: Noncompliant patient with HIV, acute systolic CHF, acute renal failure possibly going on HD discussed change of status to DNR, discussed short-term vs long-term goals of care  10)CAD--Myoview stress test from 05/14/2018 with reversible ischemia along the septal wall, cardiologist recommends medical management, concerns about early complications if left heart catheterization is done , also should angioplasty and stenting be done patient would not likely be compliant with dual antiplatelet therapy.  Cardiology service is signing off as of 05/17/2018  11)Disposition--- patient will need outpatient hemodialysis chair/spot assigned prior to discharge home , significant concerns about patient's ability to be compliant with hemodialysis  , will need outpatient HD arranged for him in the [facility] area where he plans to return home, Cont with TTS schedule, CLIP process underway but will be difficult as he lives in [facility] and has not seen a nephrologist there before.  He also cannot be discharged without his access being addressed.  DVT prophylaxis: SCD Code Status: Full Family Communication: None Disposition Plan: Dialysis access with vascular surgery  will be done Monday if patient agreeable  Consultants:  Cardiology ID Nephrology GI Palliative IR Rt Neck/Chest tunneled HD catheter placed on 05/17/18 Vascular surgery  Procedures/Significant Events:  6/25 echocardiogram:Left ventricle: LVEF =45% to 50%. Diffuse hypokinesis.- Grade 1   diastolic dysfunction. - Left atrium:  moderately dilated.  05/14/2018 abnormal Myoview stress test  Rt Neck/Chest tunneled HD catheter placed on 05/17/18,  Cultures 6/24 MRSA PCR negative  Antimicrobials: Anti-infectives (From admission, onward)   Start     Stop   05/12/18 0730  darunavir-cobicistat (PREZCOBIX) 800-150 MG per tablet 1 tablet         05/12/18 0730  dolutegravir (TIVICAY) tablet 50 mg         05/12/18 0730  rilpivirine (EDURANT) tablet 25 mg         05/12/18 0700  dolutegravir (TIVICAY) tablet 50 mg  Status:  Discontinued     05/12/18 0047   05/12/18 0700  rilpivirine (EDURANT) tablet 25 mg  Status:  Discontinued     05/12/18 0047   05/12/18 0700  darunavir-cobicistat (PREZCOBIX) 800-150 MG per tablet 1 tablet  Status:  Discontinued     05/12/18 0047   05/11/18 1015  azithromycin (ZITHROMAX) tablet 1,200 mg  05/11/18 1015  sulfamethoxazole-trimethoprim (BACTRIM,SEPTRA) 400-80 MG per tablet 1 tablet  Status:  Discontinued     05/11/18 1228   05/11/18 0800  elvitegravir-cobicistat-emtricitabine-tenofovir (GENVOYA) 150-150-200-10 MG tablet 1 tablet  Status:  Discontinued     05/10/18 1931   05/11/18 0800  darunavir (PREZISTA) tablet 800 mg  Status:  Discontinued     05/10/18 1931       Continuous Infusions: . sodium chloride Stopped (05/12/18 1526)  . vancomycin       Objective: Vitals:   05/23/18 1829 05/23/18 2333 05/24/18 0500 05/24/18 0834  BP: 132/64 121/69  (!) 141/76  Pulse: 79 72  79  Resp:  16  20  Temp:  99.1 F (37.3 C)  98.9 F (37.2 C)  TempSrc:  Oral  Oral  SpO2:    94%  Weight:  77.5 kg (170 lb 13.7 oz) 78.4 kg (172 lb 13.5 oz)   Height:         Intake/Output Summary (Last 24 hours) at 05/24/2018 0936 Last data filed at 05/23/2018 0940 Gross per 24 hour  Intake 240 ml  Output -  Net 240 ml   Filed Weights   05/23/18 0500 05/23/18 2333 05/24/18 0500  Weight: 77.5 kg (170 lb 13.7 oz) 77.5 kg (170 lb 13.7 oz) 78.4 kg (172 lb 13.5 oz)    Examination:  Physical Exam  Gen:- Awake Alert, in no apparent distress ambulating up in room without difficulty HEENT:- [place].AT, No sclera icterus Neck-Supple Neck,No JVD, Rt Neck/Chest tunneled HD catheter placed on 05/17/18,  Lungs-   clear to station bilaterally no wheezing, fair air movement CV- S1, S2 normal Abd-  +ve B.Sounds, Abd Soft, No tenderness,    Extremity-no clubbing cyanosis or edema Psych-affect is appropriate, oriented x3 Neuro-no new focal deficits, no tremors   CBC: Recent Labs  Lab 05/18/18 0800 05/19/18 1107 05/20/18 0156 05/22/18 0610 05/24/18 0754  WBC 8.3 7.9 8.0 9.5 8.3  HGB 8.8* 8.6* 7.7* 8.4* 7.9*  HCT 28.5* 27.5* 24.6* 28.0* 25.6*  MCV 88.2 88.4 89.1 91.8 89.2  PLT 321 281 211 239 197   Basic Metabolic Panel: Recent Labs  Lab 05/18/18 0317  05/19/18 1100 05/20/18 0156 05/21/18 0600 05/22/18 0610 05/23/18 0552 05/24/18 0754  NA 139  --  139 139  --  138  --  141  K 4.3  --  3.7 3.8  --  4.5  --  4.0  CL 108  --  105 103  --  99  --  102  CO2 21*  --  23 27  --  28  --  29  GLUCOSE 108*  --  98 95  --  88  --  94  BUN 59*  --  53* 42*  --  41*  --  36*  CREATININE 8.16*  --  7.34* 6.55*  --  7.22*  --  6.79*  CALCIUM 8.4*  --  8.2* 8.1*  --  8.6*  --  8.5*  MG 1.9   < >  --  1.8 2.0 1.9 1.9 1.8  PHOS  --   --  6.1*  --   --  5.3*  --  6.3*   < > = values in this interval not displayed.   GFR: Estimated Creatinine Clearance: 11.7 mL/min (A) (by C-G formula based on SCr of 6.79 mg/dL (H)). Liver Function Tests: Recent Labs  Lab 05/19/18 1100 05/22/18 0610 05/24/18 0754  ALBUMIN 1.8* 2.1* 2.0*   No results   for input(s): LIPASE,  AMYLASE in the last 168 hours. No results for input(s): AMMONIA in the last 168 hours. Coagulation Profile: No results for input(s): INR, PROTIME in the last 168 hours. Cardiac Enzymes: No results for input(s): CKTOTAL, CKMB, CKMBINDEX, TROPONINI in the last 168 hours. BNP (last 3 results) No results for input(s): PROBNP in the last 8760 hours. HbA1C: No results for input(s): HGBA1C in the last 72 hours. CBG: No results for input(s): GLUCAP in the last 168 hours. Lipid Profile: No results for input(s): CHOL, HDL, LDLCALC, TRIG, CHOLHDL, LDLDIRECT in the last 72 hours. Thyroid Function Tests: No results for input(s): TSH, T4TOTAL, FREET4, T3FREE, THYROIDAB in the last 72 hours. Anemia Panel: No results for input(s): VITAMINB12, FOLATE, FERRITIN, TIBC, IRON, RETICCTPCT in the last 72 hours. Urine analysis:    Component Value Date/Time   COLORURINE YELLOW 05/11/2018 0606   APPEARANCEUR HAZY (A) 05/11/2018 0606   LABSPEC 1.012 05/11/2018 0606   PHURINE 5.0 05/11/2018 0606   GLUCOSEU NEGATIVE 05/11/2018 0606   GLUCOSEU NEG mg/dL 11/23/2006 2036   HGBUR MODERATE (A) 05/11/2018 0606   BILIRUBINUR NEGATIVE 05/11/2018 0606   KETONESUR NEGATIVE 05/11/2018 0606   PROTEINUR >=300 (A) 05/11/2018 0606   UROBILINOGEN 0.2 11/23/2006 2036   NITRITE NEGATIVE 05/11/2018 0606   LEUKOCYTESUR NEGATIVE 05/11/2018 0606   Sepsis Labs: @LABRCNTIP(procalcitonin:4,lacticidven:4)  ) No results found for this or any previous visit (from the past 240 hour(s)).  Radiology Studies: No results found.  Scheduled Meds: . amLODipine  10 mg Oral QHS  . aspirin  81 mg Oral Daily  . azithromycin  1,200 mg Oral Weekly  . calcium acetate  2,001 mg Oral TID WC  . carvedilol  25 mg Oral BID WC  . Chlorhexidine Gluconate Cloth  6 each Topical Q0600  . dapsone  100 mg Oral Daily  . [START ON 05/25/2018] darbepoetin (ARANESP) injection - DIALYSIS  60 mcg Intravenous Q Tue-HD  . darunavir-cobicistat  1 tablet Oral  Q breakfast  . dolutegravir  50 mg Oral Q breakfast  . lactulose  20 g Oral TID  . rilpivirine  25 mg Oral Q breakfast  . senna-docusate  1 tablet Oral Daily   Continuous Infusions: . sodium chloride Stopped (05/12/18 1526)  . vancomycin       LOS: 14 days    , A, MD [facility]    If 7PM-7AM, please contact night-coverage [email or web address] Password [email or web address] 05/24/2018, 9:36 AM     Patient ID: [name], male   DOB: 02/24/1961, 56 y.o.   MRN: [id number]  

## 2018-05-24 NOTE — Anesthesia Postprocedure Evaluation (Signed)
Anesthesia Post Note  Patient: [name]  Procedure(s) Performed: FIRST STAGE BASCILIC VEIN TRANSPOSITION (Left Arm Upper)     Patient location during evaluation: PACU Anesthesia Type: MAC Level of consciousness: awake and alert Pain management: pain level controlled Vital Signs Assessment: post-procedure vital signs reviewed and stable Respiratory status: spontaneous breathing Cardiovascular status: stable Anesthetic complications: no    Last Vitals:  Vitals:   05/24/18 1515 05/24/18 1525  BP: (!) 147/74 129/73  Pulse: (!) 56 (!) 59  Resp: 16 15  Temp:  36.5 C  SpO2: 93% 93%    Last Pain:  Vitals:   05/24/18 1525  TempSrc:   PainSc: [place]

## 2018-05-24 NOTE — Care Management Note (Signed)
Case Management Note  Patient Details  Name: [name] MRN: [id number] Date of Birth: 08-May-1961  Subjective/Objective:       From [facility], says he can not afford his medications, he has Medicare , states he does not feel like talking right now, for NCM to speak with him later.    7/2 [name] RN, BSN- patient discussed in LOS , per Med Director , patient would  Be good candidate for [facility]  NCM will have [facility] reps look at today. ID is also aware of [facility] , Id Md  was also in LOS.  NCM confirmed with attending as well.   Per [facility] rep they do not have any HD beds available, and [name] states that patient does not have a 3 night sdu stay and she will need the revenue codes.   Tunneled HD catheter placed on 6/30 and HD started.  Hopeful for renal recovery if not will need ongoing HD, conts on iv abx, iv lasix.  7/8 [name] RN, BSN - patient now deemed as ESRD, started HD on 7/2 [facility] placed, for AVF placement today.  Clipping process underway, which may be difficult, he lives in [place].                         Action/Plan: DC home when clipping process completed.  Expected Discharge Date:  05/13/18               Expected Discharge Plan:  Home/Self Care  In-House Referral:     Discharge planning Services  CM Consult  Post Acute Care Choice:    Choice offered to:     DME Arranged:    DME Agency:     HH Arranged:    HH Agency:     Status of Service:  In process, will continue to follow  If discussed at Long Length of Stay Meetings, dates discussed:    Additional Comments:  Zenon Mayo, RN 05/24/2018, 9:40 AM

## 2018-05-24 NOTE — Transfer of Care (Signed)
Immediate Anesthesia Transfer of Care Note  Patient: [name]  Procedure(s) Performed: FIRST STAGE BASCILIC VEIN TRANSPOSITION (Left Arm Upper)  Patient Location: PACU  Anesthesia Type:MAC  Level of Consciousness: awake, alert , oriented, drowsy and patient cooperative  Airway & Oxygen Therapy: Patient Spontanous Breathing and Patient connected to nasal cannula oxygen  Post-op Assessment: Report given to RN and Post -op Vital signs reviewed and stable  Post vital signs: Reviewed and stable  Last Vitals:  Vitals Value Taken Time  BP 140/79 05/24/2018  2:57 PM  Temp 36.5 C 05/24/2018  2:57 PM  Pulse 62 05/24/2018  2:58 PM  Resp 14 05/24/2018  2:58 PM  SpO2 100 % 05/24/2018  2:58 PM  Vitals shown include unvalidated device data.  Last Pain:  Vitals:   05/24/18 0834  TempSrc: Oral  PainSc:       Patients Stated Pain Goal: 0 (99/87/21 5872)  Complications: No apparent anesthesia complications

## 2018-05-25 ENCOUNTER — Encounter (HOSPITAL_COMMUNITY): Payer: Self-pay | Admitting: Vascular Surgery

## 2018-05-25 ENCOUNTER — Telehealth: Payer: Self-pay | Admitting: Vascular Surgery

## 2018-05-25 LAB — CBC
HEMATOCRIT: 26.6 % — AB (ref 39.0–52.0)
Hemoglobin: 7.9 g/dL — ABNORMAL LOW (ref 13.0–17.0)
MCH: 26.7 pg (ref 26.0–34.0)
MCHC: 29.7 g/dL — AB (ref 30.0–36.0)
MCV: 89.9 fL (ref 78.0–100.0)
PLATELETS: 307 10*3/uL (ref 150–400)
RBC: 2.96 MIL/uL — ABNORMAL LOW (ref 4.22–5.81)
RDW: 15.4 % (ref 11.5–15.5)
WBC: 9.4 10*3/uL (ref 4.0–10.5)

## 2018-05-25 LAB — MAGNESIUM: Magnesium: 2.1 mg/dL (ref 1.7–2.4)

## 2018-05-25 LAB — RENAL FUNCTION PANEL
Albumin: 1.9 g/dL — ABNORMAL LOW (ref 3.5–5.0)
Anion gap: 13 (ref 5–15)
BUN: 46 mg/dL — AB (ref 6–20)
CHLORIDE: 98 mmol/L (ref 98–111)
CO2: 26 mmol/L (ref 22–32)
CREATININE: 7.52 mg/dL — AB (ref 0.61–1.24)
Calcium: 8.6 mg/dL — ABNORMAL LOW (ref 8.9–10.3)
GFR calc Af Amer: 8 mL/min — ABNORMAL LOW (ref >60)
GFR, EST NON AFRICAN AMERICAN: 7 mL/min — AB (ref >60)
GLUCOSE: 103 mg/dL — AB (ref 70–99)
POTASSIUM: 3.7 mmol/L (ref 3.5–5.1)
Phosphorus: 6.6 mg/dL — ABNORMAL HIGH (ref 2.5–4.6)
Sodium: 137 mmol/L (ref 135–145)

## 2018-05-25 MED ORDER — CARVEDILOL 12.5 MG PO TABS
12.5000 mg | ORAL_TABLET | Freq: Two times a day (BID) | ORAL | Status: DC
  Administered 2018-05-25 – 2018-05-26 (×2): 12.5 mg via ORAL
  Filled 2018-05-25 (×2): qty 1

## 2018-05-25 MED ORDER — ALTEPLASE 2 MG IJ SOLR: 2.0000 mg | Freq: Once | INTRAMUSCULAR | Status: DC | PRN

## 2018-05-25 MED ORDER — DARBEPOETIN ALFA 200 MCG/0.4ML IJ SOSY
PREFILLED_SYRINGE | INTRAMUSCULAR | Status: AC
  Administered 2018-05-25: 200 ug via INTRAVENOUS
  Filled 2018-05-25: qty 0.4

## 2018-05-25 MED ORDER — SODIUM CHLORIDE 0.9 % IV SOLN: 100.0000 mL | INTRAVENOUS | Status: DC | PRN

## 2018-05-25 MED ORDER — HEPARIN SODIUM (PORCINE) 1000 UNIT/ML DIALYSIS
1000.0000 [IU] | INTRAMUSCULAR | Status: DC | PRN
  Filled 2018-05-25: qty 1

## 2018-05-25 MED ORDER — LIDOCAINE HCL (PF) 1 % IJ SOLN: 5.0000 mL | INTRAMUSCULAR | Status: DC | PRN

## 2018-05-25 MED ORDER — LIDOCAINE-PRILOCAINE 2.5-2.5 % EX CREA
1.0000 "application " | TOPICAL_CREAM | CUTANEOUS | Status: DC | PRN
  Filled 2018-05-25: qty 5

## 2018-05-25 MED ORDER — PENTAFLUOROPROP-TETRAFLUOROETH EX AERO: 1.0000 "application " | INHALATION_SPRAY | CUTANEOUS | Status: DC | PRN

## 2018-05-25 MED ORDER — HEPARIN SODIUM (PORCINE) 1000 UNIT/ML DIALYSIS
40.0000 [IU]/kg | Freq: Once | INTRAMUSCULAR | Status: DC
  Filled 2018-05-25: qty 4

## 2018-05-25 NOTE — Plan of Care (Signed)
Discussed with patient plan of care for the evening, pain management and ordering another dinner meal since he didn't eat his dinner tray with some teach back displayed.  Gave patient assistance and ordered new tray.

## 2018-05-25 NOTE — Procedures (Signed)
I was present at this session.  I have reviewed the session itself and made appropriate changes.  HDvia. PC. bp in 130s t0 140s.  tol well. Cath flow 400.  [name] 7/9/20194:05 PM

## 2018-05-25 NOTE — Progress Notes (Signed)
Reports some mild soreness at the antecubital space.  Wound healing without hematoma.  Excellent thrill noted.  Will coordinate office visit in 1 month.  Will need second stage basilic vein transposition.  Discussed this with the patient.

## 2018-05-25 NOTE — Telephone Encounter (Signed)
sch appt lvm 07/06/18 3pm Dialysis Duplex 4pm p/o MD

## 2018-05-25 NOTE — Progress Notes (Signed)
Subjective: Interval History: has complaints constip, wants shower.  Objective: Vital signs in last 24 hours: Temp:  [97.7 F (36.5 C)-99.2 F (37.3 C)] 97.7 F (36.5 C) (07/09 0750) Pulse Rate:  [54-67] 67 (07/09 0750) Resp:  [7-20] 19 (07/09 0750) BP: (98-147)/(53-85) 133/80 (07/09 0750) SpO2:  [91 %-100 %] 95 % (07/09 0750) Weight:  [81.7 kg (180 lb 1.9 oz)] 81.7 kg (180 lb 1.9 oz) (07/09 0431) Weight change: 4.2 kg (9 lb 4.2 oz)  Intake/Output from previous day: 07/08 0701 - 07/09 0700 In: 510 [P.O.:360; I.V.:150] Out: 505 [Urine:500; Blood:5] Intake/Output this shift: No intake/output data recorded.  General appearance: alert, cooperative and no distress Resp: clear to auscultation bilaterally Chest wall: RIJ cath Cardio: S1, S2 normal and systolic murmur: systolic ejection 2/6, decrescendo at 2nd left intercostal space GI: soft, pos bs, liver down 5 cm Extremities: new AVF LUA ,  Lab Results: Recent Labs    05/24/18 0754  WBC 8.3  HGB 7.9*  HCT 25.6*  PLT 197   BMET:  Recent Labs    05/24/18 0754  NA 141  K 4.0  CL 102  CO2 29  GLUCOSE 94  BUN 36*  CREATININE 6.79*  CALCIUM 8.5*   No results for input(s): PTH in the last 72 hours. Iron Studies: No results for input(s): IRON, TIBC, TRANSFERRIN, FERRITIN in the last 72 hours.  Studies/Results: No results found.  I have reviewed the patient's current medications.  Assessment/Plan: 1 ESRD BVT 1st stage, for HD 2 Anemia esa/Fe 3 HPTH vit D 4 HIV on meds 5 HTN lower meds, lower vol  6 NONADHERENCE P HD, esa, CLIP. Lower bp meds   LOS: 15 days   Jeneen Rinks Saydi Kobel 05/25/2018,9:49 AM

## 2018-05-25 NOTE — Progress Notes (Signed)
Patient ID: [name], male   DOB: 1961-10-02, 57 y.o.   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, face, and neck ([place])   Hypertension   Chest pain   Tobacco use   Furuncle of gluteal region   CHF (congestive heart failure) ([facility])   Hypertensive urgency   GIB (gastrointestinal bleeding)   AKI (acute kidney injury) ([place])   Heme + stool   Normocytic anemia   Pain of both hip joints   Goals of care, counseling/discussion   Palliative care by specialist  Brief Narrative:  [name] PMHx  HIV with CD4 count of 75, 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  He also reported B LE swelling, paroxysmal nocturnal dyspnea, and bright red blood per rectum for a weeks duration.  Rt Neck/Chest tunneled HD catheter placed on 05/17/18, first HD session on 05/18/18.Marland Kitchen CLIP process underway but will be difficult as he lives in [facility] and has not seen a nephrologist there before.    Subjective:-No new complaints, tolerated hemodialysis well on 05/25/2017  Plan:-  1)Acute kidney injury superimposed on progressive CKD V/ ESRD-   in the setting of uncontrolled hypertension and noncompliance with HIV medications (hypertensive nephropathy And HIV nephropathy)- creatinine was >  8,  -Discussed case with nephrology [name] ,   Rt Neck/Chest tunneled HD catheter placed on 05/17/18 (initiated on HD on 05/18/18), tolerated HD session on 05/20/18, patient now appears to be in ESRD according to [name]. Please see Nephrology progress note dated 05/21/2018 . cont with TTS  schedule, CLIP process underway but will be difficult as he lives in [facility] and has not seen a nephrologist there before. There significant concerns about patient's ability to be compliant with hemodialysis  , will need outpatient HD arranged for him in the [place] area where he plans to return home.   - Avoid nephrotoxic medication  Recent Labs  Lab 05/19/18 1100 05/20/18 0156 05/22/18 0610 05/24/18 0754 05/25/18 1337  CREATININE 7.34* 6.55* 7.22* 6.79* 7.52*   2)HIV/AIDS- - Noncompliant with medication.- - HIV medications ( [name] changed to a more renal based regimen of TIVICAY PREZCOBIX and RPV)  per ID,   CD4 count was 50,   ID consult appreciated,  HLA B701 ordered,  -Azithromycin 1200 mg weekly (q Tuesday), dapsone 100 mg weekly (q Thursday),  , -Prezcobix 800-150 mg daily, -Tivicay 50 mg daily, -Edurant 25 mg daily  Infectious disease service is signing off as of 05/16/2018   3)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 Hold off on statin due to HAART and advanced  renal disease.    Cardiology service is signing off as of 05/17/2018  4)Uncontrolled essential HTN- - improved BP control, multifactorial, but mostly due to noncompliance with medication, - c/n Norvasc 10 mg daily, Coreg twice daily, Lasix 40 mg IV twice daily, BiDil 1 tab 3 times daily   5)GI bleed/BRBPR- -  Currently patient refuses colonoscopy., -GI input appreciated, no further bleeding in the last  several days.    GI signed off as of 05/16/2018  6)Normocytic anemia- -Anemia panel consistent with anemia of chronic disease.  Suspect a combination of AIDS +Renal disease does not appear to have significant GI bleed. ESA/Procrit as per nephrology team  7)Chronic boil of buttocks- -Hx of chronic "boil" of buttock, -I&D in ED.  Culture not sent   8)Polysubstance abuse-- 6/25 UDS positive marijuana  9)Goal of care -6/26 PALLIATIVE CARE: Noncompliant patient with HIV, acute systolic CHF, acute renal failure possibly going on HD discussed change of status to DNR, discussed short-term vs long-term goals of care  10)CAD--Myoview stress test from 05/14/2018 with reversible ischemia along the septal wall, cardiologist recommends medical management, concerns about early complications if left heart catheterization is done , also should angioplasty and stenting be done patient would not likely be compliant with dual antiplatelet therapy.  Cardiology service is signing off as of 05/17/2018  11)Disposition--- patient will need outpatient hemodialysis chair/spot assigned prior to discharge home , significant concerns about patient's ability to be compliant with hemodialysis  , will need outpatient HD arranged for him in the [facility] area where he plans to return home, Cont with TTS schedule, CLIP process underway but will be difficult as he lives in [facility] and has not seen a nephrologist there before.     DVT prophylaxis: SCD Code Status: Full Family Communication: None Disposition Plan: Dialysis access with vascular surgery will be done Monday if patient agreeable  Consultants:  Cardiology ID Nephrology GI Palliative IR Rt Neck/Chest tunneled HD catheter placed on 05/17/18 Vascular surgery  Procedures/Significant Events:  6/25 echocardiogram:Left ventricle: LVEF =45% to 50%. Diffuse hypokinesis.- Grade 1   diastolic dysfunction. - Left atrium:  moderately dilated.  05/14/2018 abnormal  Myoview stress test  Rt Neck/Chest tunneled HD catheter placed on 05/17/18,  05/24/18---- Had Left first stage Brachiobasilic Fistula, Will need second stage basilic vein transposition, sch appt lvm 07/06/18 3pm Dialysis Duplex 4pm  Cultures 6/24 MRSA PCR negative  Antimicrobials: Anti-infectives (From admission, onward)   Start     Stop   05/12/18 0730  darunavir-cobicistat (PREZCOBIX) 800-150 MG per tablet 1 tablet         05/12/18 0730  dolutegravir (TIVICAY) tablet 50 mg         05/12/18 0730  rilpivirine (EDURANT) tablet 25 mg         05/12/18 0700  dolutegravir (TIVICAY) tablet 50 mg  Status:  Discontinued     05/12/18 0047   05/12/18 0700  rilpivirine (EDURANT) tablet 25 mg  Status:  Discontinued     05/12/18 0047   05/12/18 0700  darunavir-cobicistat (PREZCOBIX) 800-150 MG per tablet 1 tablet  Status:  Discontinued     05/12/18 0047   05/11/18 1015  azithromycin (ZITHROMAX) tablet 1,200 mg         05/11/18 1015  sulfamethoxazole-trimethoprim (BACTRIM,SEPTRA) 400-80 MG per tablet 1 tablet  Status:  Discontinued     05/11/18 1228   05/11/18 0800  elvitegravir-cobicistat-emtricitabine-tenofovir (GENVOYA) 150-150-200-10 MG tablet 1 tablet  Status:  Discontinued     05/10/18  1931   05/11/18 0800  darunavir (PREZISTA) tablet 800 mg  Status:  Discontinued     05/10/18 1931       Continuous Infusions: . sodium chloride Stopped (05/12/18 1526)  . sodium chloride    . sodium chloride      Objective: Vitals:   05/25/18 1545 05/25/18 1615 05/25/18 1623 05/25/18 1645  BP: 131/75 134/81 (!) 177/96 136/78  Pulse: 70 73 73 73  Resp: '16 16 17 16  ' Temp:   98.5 F (36.9 C)   TempSrc:   Oral   SpO2:   98%   Weight:      Height:        Intake/Output Summary (Last 24 hours) at 05/25/2018 1709 Last data filed at 05/25/2018 0810 Gross per 24 hour  Intake 600 ml  Output 500 ml  Net 100 ml   Filed Weights   05/24/18 0500 05/25/18 0431 05/25/18 1310  Weight: 78.4 kg (172 lb 13.5  oz) 81.7 kg (180 lb 1.9 oz) 79.5 kg (175 lb 4.3 oz)    Examination:  Physical Exam  Gen:- Awake Alert, in no apparent distress ambulating up in room without difficulty HEENT:- [place].AT, No sclera icterus Neck-Supple Neck,No JVD, Rt Neck/Chest tunneled HD catheter placed on 05/17/18,  Lungs-   clear to station bilaterally no wheezing, fair air movement CV- S1, S2 normal Abd-  +ve B.Sounds, Abd Soft, No tenderness,    Extremity-no clubbing cyanosis or edema, left antecubital fossa area AV fistula with positive thrill and bruit Psych-affect is appropriate, oriented x3 Neuro-no new focal deficits, no tremors   CBC: Recent Labs  Lab 05/19/18 1107 05/20/18 0156 05/22/18 0610 05/24/18 0754 05/25/18 1337  WBC 7.9 8.0 9.5 8.3 9.4  HGB 8.6* 7.7* 8.4* 7.9* 7.9*  HCT 27.5* 24.6* 28.0* 25.6* 26.6*  MCV 88.4 89.1 91.8 89.2 89.9  PLT 281 211 239 197 324   Basic Metabolic Panel: Recent Labs  Lab 05/19/18 1100 05/20/18 0156 05/21/18 0600 05/22/18 0610 05/23/18 0552 05/24/18 0754 05/25/18 1337  NA 139 139  --  138  --  141 137  K 3.7 3.8  --  4.5  --  4.0 3.7  CL 105 103  --  99  --  102 98  CO2 23 27  --  28  --  29 26  GLUCOSE 98 95  --  88  --  94 103*  BUN 53* 42*  --  41*  --  36* 46*  CREATININE 7.34* 6.55*  --  7.22*  --  6.79* 7.52*  CALCIUM 8.2* 8.1*  --  8.6*  --  8.5* 8.6*  MG  --  1.8 2.0 1.9 1.9 1.8 2.1  PHOS 6.1*  --   --  5.3*  --  6.3* 6.6*   GFR: Estimated Creatinine Clearance: 10.7 mL/min (A) (by C-G formula based on SCr of 7.52 mg/dL (H)). Liver Function Tests: Recent Labs  Lab 05/19/18 1100 05/22/18 0610 05/24/18 0754 05/25/18 1337  ALBUMIN 1.8* 2.1* 2.0* 1.9*   No results for input(s): LIPASE, AMYLASE in the last 168 hours. No results for input(s): AMMONIA in the last 168 hours. Coagulation Profile: No results for input(s): INR, PROTIME in the last 168 hours. Cardiac Enzymes: No results for input(s): CKTOTAL, CKMB, CKMBINDEX, TROPONINI in the last  168 hours. BNP (last 3 results) No results for input(s): PROBNP in the last 8760 hours. HbA1C: No results for input(s): HGBA1C in the last 72 hours. CBG: Recent Labs  Lab 05/24/18  [place]   Lipid Profile: No results for input(s): CHOL, HDL, LDLCALC, TRIG, CHOLHDL, LDLDIRECT in the last 72 hours. Thyroid Function Tests: No results for input(s): TSH, T4TOTAL, FREET4, T3FREE, THYROIDAB in the last 72 hours. Anemia Panel: No results for input(s): VITAMINB12, FOLATE, FERRITIN, TIBC, IRON, RETICCTPCT in the last 72 hours. Urine analysis:    Component Value Date/Time   COLORURINE YELLOW 05/11/2018 0606   APPEARANCEUR HAZY (A) 05/11/2018 0606   LABSPEC 1.012 05/11/2018 0606   PHURINE 5.0 05/11/2018 0606   GLUCOSEU NEGATIVE 05/11/2018 0606   GLUCOSEU NEG mg/dL 11/23/2006 2036   HGBUR MODERATE (A) 05/11/2018 0606   BILIRUBINUR NEGATIVE 05/11/2018 0606   KETONESUR NEGATIVE 05/11/2018 0606   PROTEINUR >=300 (A) 05/11/2018 0606   UROBILINOGEN 0.2 11/23/2006 2036   NITRITE NEGATIVE 05/11/2018 0606   LEUKOCYTESUR NEGATIVE 05/11/2018 0606   Sepsis Labs: '@LABRCNTIP' (procalcitonin:4,lacticidven:4)  ) No results found for this or any previous visit (from the past 240 hour(s)).  Radiology Studies: No results found.  Scheduled Meds: . amLODipine  10 mg Oral QHS  . aspirin  81 mg Oral Daily  . azithromycin  1,200 mg Oral Weekly  . calcium acetate  2,001 mg Oral TID WC  . carvedilol  12.5 mg Oral BID WC  . Chlorhexidine Gluconate Cloth  6 each Topical Q0600  . dapsone  100 mg Oral Daily  . darbepoetin (ARANESP) injection - DIALYSIS  200 mcg Intravenous Q Tue-HD  . darunavir-cobicistat  1 tablet Oral Q breakfast  . dolutegravir  50 mg Oral Q breakfast  . heparin  40 Units/kg Dialysis Once in dialysis  . rilpivirine  25 mg Oral Q breakfast  . senna-docusate  1 tablet Oral Daily   Continuous Infusions: . sodium chloride Stopped (05/12/18 1526)  . sodium chloride    . sodium  chloride       LOS: 15 days    [name] Hockey, [name] [name]    If 7PM-7AM, please contact night-coverage [email or web address] Password [email or web address] 05/25/2018, 5:09 PM     Patient ID: [name], male   DOB: 1961/06/08, 56 y.o.   MRN: [id number]

## 2018-05-26 ENCOUNTER — Other Ambulatory Visit: Payer: Self-pay

## 2018-05-26 DIAGNOSIS — N186 End stage renal disease: Secondary | ICD-10-CM

## 2018-05-26 DIAGNOSIS — Z48812 Encounter for surgical aftercare following surgery on the circulatory system: Secondary | ICD-10-CM

## 2018-05-26 LAB — CBC
HEMATOCRIT: 25.7 % — AB (ref 39.0–52.0)
HEMOGLOBIN: 7.8 g/dL — AB (ref 13.0–17.0)
MCH: 27 pg (ref 26.0–34.0)
MCHC: 30.4 g/dL (ref 30.0–36.0)
MCV: 88.9 fL (ref 78.0–100.0)
Platelets: 257 10*3/uL (ref 150–400)
RBC: 2.89 MIL/uL — AB (ref 4.22–5.81)
RDW: 15.1 % (ref 11.5–15.5)
WBC: 7.8 10*3/uL (ref 4.0–10.5)

## 2018-05-26 LAB — BASIC METABOLIC PANEL
Anion gap: 9 (ref 5–15)
BUN: 31 mg/dL — ABNORMAL HIGH (ref 6–20)
CHLORIDE: 99 mmol/L (ref 98–111)
CO2: 28 mmol/L (ref 22–32)
Calcium: 8.7 mg/dL — ABNORMAL LOW (ref 8.9–10.3)
Creatinine, Ser: 5.48 mg/dL — ABNORMAL HIGH (ref 0.61–1.24)
GFR calc Af Amer: 12 mL/min — ABNORMAL LOW (ref >60)
GFR, EST NON AFRICAN AMERICAN: 11 mL/min — AB (ref >60)
GLUCOSE: 84 mg/dL (ref 70–99)
POTASSIUM: 4 mmol/L (ref 3.5–5.1)
Sodium: 136 mmol/L (ref 135–145)

## 2018-05-26 MED ORDER — ONDANSETRON HCL 4 MG PO TABS: 4.0000 mg | ORAL_TABLET | Freq: Four times a day (QID) | ORAL | 0 refills | Status: DC | PRN

## 2018-05-26 MED ORDER — CARVEDILOL 12.5 MG PO TABS: 12.5000 mg | ORAL_TABLET | Freq: Two times a day (BID) | ORAL | 1 refills | Status: DC

## 2018-05-26 MED ORDER — CALCIUM ACETATE (PHOS BINDER) 667 MG PO CAPS: 2001.0000 mg | ORAL_CAPSULE | Freq: Three times a day (TID) | ORAL | 5 refills | Status: DC

## 2018-05-26 MED ORDER — ASPIRIN 81 MG PO CHEW: 81.0000 mg | CHEWABLE_TABLET | Freq: Every day | ORAL | 5 refills | Status: DC

## 2018-05-26 MED ORDER — DAPSONE 100 MG PO TABS: 100.0000 mg | ORAL_TABLET | Freq: Every day | ORAL | 5 refills | Status: DC

## 2018-05-26 MED ORDER — BISACODYL 10 MG RE SUPP: 10.0000 mg | Freq: Every day | RECTAL | 1 refills | Status: DC | PRN

## 2018-05-26 MED ORDER — DARUNAVIR-COBICISTAT 800-150 MG PO TABS: 1.0000 | ORAL_TABLET | Freq: Every day | ORAL | 5 refills | Status: DC

## 2018-05-26 MED ORDER — DOLUTEGRAVIR SODIUM 50 MG PO TABS: 50.0000 mg | ORAL_TABLET | Freq: Every day | ORAL | 5 refills | Status: DC

## 2018-05-26 MED ORDER — FLUCONAZOLE 100 MG PO TABS: ORAL_TABLET | ORAL | 0 refills | Status: DC

## 2018-05-26 MED ORDER — DARUNAVIR ETHANOLATE 800 MG PO TABS: 800.0000 mg | ORAL_TABLET | Freq: Every day | ORAL | Status: DC

## 2018-05-26 MED ORDER — RILPIVIRINE HCL 25 MG PO TABS: 25.0000 mg | ORAL_TABLET | Freq: Every day | ORAL | 5 refills | Status: DC

## 2018-05-26 MED ORDER — AMLODIPINE BESYLATE 10 MG PO TABS: 10.0000 mg | ORAL_TABLET | Freq: Every day | ORAL | 3 refills | Status: DC

## 2018-05-26 MED ORDER — AZITHROMYCIN 600 MG PO TABS: 1200.0000 mg | ORAL_TABLET | ORAL | 5 refills | Status: DC

## 2018-05-26 MED ORDER — POLYETHYLENE GLYCOL 3350 17 G PO PACK: 17.0000 g | PACK | Freq: Every day | ORAL | 1 refills | Status: DC

## 2018-05-26 NOTE — Discharge Summary (Signed)
[name], is a 57 y.o. male  DOB January 15, 1961  MRN [id number].  Admission date:  05/10/2018  Admitting Physician  Kayleen Memos, DO  Discharge Date:  05/26/2018   Primary MD  [name], MD  Recommendations for primary care physician for things to follow:   [facility] Phone: [phone] Address: [address] [place], [place] to above address for appointment with Dr. Quentin Cornwall at 9:30am on Friday 05/28/18.  You will have dialysis following this appointment at 11:30am on Friday 05/28/18   1)[facility] Phone: [phone] Address: [address], [address] [place], [place] to Above Address for Appointment with Dr. Quentin Cornwall at 9:30am on Friday 05/28/18.  You will have dialysis following this appointment at 11:30am on Friday 05/28/18  2)Take all your medications as prescribed  3)Avoid ibuprofen/Advil/Aleve/Motrin/Goody Powders/Naproxen/BC powders/Meloxicam/Diclofenac/Indomethacin and other Nonsteroidal anti-inflammatory medications as these will make you more likely to bleed and can cause stomach ulcers, can also cause Kidney problems.    4))Very low-salt diet advised  5)Weigh yourself daily, call if you gain more than 3 pounds in 1 day or more than 5 pounds in 1 week as your diuretic medications may need to be adjusted 6)Limit your Fluid  intake to no more than 60 ounces (1.8 Liters) per day  7)You need to  establish care with a local primary care provider--- your New local primary care provider will refer you to a cardiologist and an infectious disease/HIV specialist for ongoing management of your heart failure and HIV/AIDS  8)Follow-up with Dr. Donnetta Hutching  the vascular surgeon [phone]) need second stage basilic vein transposition, appointment scheduled for 07/06/18 at  3pm Dialysis Duplex at 4pm on 07/06/18  for the second  stage of your Left Arm AV fistula creation to allow for hemodialysis through your graft   Admission Diagnosis  Human immunodeficiency virus (HIV) disease ([place]) [B20] AKI (acute kidney injury) ([place]) [N17.9]   Discharge Diagnosis  Human immunodeficiency virus (HIV) disease ([place]) [B20] AKI (acute kidney injury) ([place]) [N17.9]    Principal Problem:   ESRD (end stage renal disease)  Active Problems:   Human immunodeficiency virus (HIV) disease ([place])   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 Candidiasis of mouth   . Chest pain, unspecified   . Diseases of lips    Angular cheilitis  . HIV infection ([place])   . Hypertension   .  Lymphoma ([place])   . Personal history of unspecified disease of respiratory system   . Pneumocystosis ([place])   . Shingles   . Tobacco use     Past Surgical History:  Procedure Laterality Date  . BASCILIC VEIN TRANSPOSITION Left 05/24/2018   Procedure: FIRST STAGE [place];  Surgeon: Rosetta Posner, MD;  Location: [place];  Service: Vascular;  Laterality: Left;  . IR FLUORO GUIDE CV LINE RIGHT  05/17/2018  . IR US GUIDE VASC ACCESS RIGHT  05/17/2018       HPI  from the history and physical done on the day of admission:   Patient coming from:  Home    Chief Complaint: Chest pain, shortness of breath, GI bleed  HPI: [name] is a 57 y.o. male with medical history significant for HIV with CD4 count of 80, detectable viral load, hypertension,  remote history of polysubstance abuse and alcohol abuse, sent from his infectious disease doctor after an appointment today, when he developed  right-sided chest pain, with radiation to the right arm and nausea, shortness of breath after walking short distance to the parking lot at the office.  Of note, the right chest pain has been present for about 2 weeks, and is intermittent, at times waking him up from sleep.  At the time, the chest pain was 7 out of 10, but on presentation he was about 4 out of 10.  He also reports bilateral lower extremity swelling, and paroxysmal nocturnal dyspnea, shortness of breath, . He was also noted to be hypertensive at the office, and sent to the ER for further evaluation.  He has been noncompliant with the medication over the last 2 years.  The patient lives in [facility], and has not been seen by ID in several months, and she is not taking any HIV medications.  In addition, the patient reports bright red blood per rectum over the last week, without alleviating factors.  The patient denies any prior history of GI bleed.   ED Course:  BP (!) 206/125 (BP Location: Left Arm)   Pulse 83   Temp 98.4 F (36.9 C) (Oral)   Resp 17   Ht 5' 5" (1.651 m)   Wt 75.8 kg (167 lb)   SpO2 100%   BMI 27.79 kg/m    Chloride 117, bicarb 15, glucose 106, BUN 73, creatinine 5.82, calcium 7.5, hemoglobin 7.2, troponin 0 0.11 Hemoccult positive EKG normal sinus rhythm nonspecific ST and T wave abnormality, but without significant changes since her last tracing.   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 [name] on 05/10/2018  w/right-sided chest pain with radiation  to the right arm,nausea,and SOBafter walking ashort distance. He also reported B LEswelling, paroxysmal nocturnal dyspnea, andbright red blood per rectumfor aweeks duration. Rt Neck/Chest tunneled HD catheter placed on 05/17/18, first HD session on 05/18/18.Marland Kitchen    Hemodialysis   at 11:30am on Friday 05/28/18 at [facility] in [place], [place]     Subjective:-Grumpy, refused labs, initially, now agreeable to labs after long conversation   Plan:-  1)Acute kidney injury superimposed on progressive CKD V/ ESRD-  in the setting of uncontrolled hypertension and noncompliance with HIV medications (hypertensive nephropathy And HIV nephropathy)- creatinine was >  8,  -Discussed case with nephrologist  ,   Rt Neck/Chest tunneled HD catheter placed on 05/17/18 (initiated on HD on 05/18/18), patient now appears to be in ESRD according to Nephrologist. Please see Nephrology progress note dated 05/21/2018 . cont with TTS schedule, CLIP process initiated by case management social workers,  There is significant concerns about patient's ability to be compliant with hemodialysis  .  05/24/18---- Had Left first stage Brachiobasilic Fistula, Will need second stage basilic vein transposition, sch appt lvm 07/06/18 3pm Dialysis Duplex 4pm - Avoid nephrotoxic medication, hemodialysis is set up for patient starting Friday, 05/28/2018 at [facility] with [facility]  2)HIV/AIDS- - Noncompliant with medication.- - HIV medications ( Dr Tommy Medal changed to a more renal based regimen of TIVICAY PREZCOBIX and RPV)  per ID,   CD4 count was 50,   ID consult appreciated,  HLA B701 ordered,  -Azithromycin 1200 mg weekly (q Tuesday), dapsone 100 mg weekly (q Thursday),  , -Prezcobix 800-150 mg daily, -Tivicay 50 mg daily, -Edurant 25 mg daily  Infectious disease service is signing off as of 05/16/2018  Outpatient follow-up with infectious disease/HIV specialist  advised   3)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 Hold off on statin due to HAART and advanced renal disease.    Cardiology service is signing off as of 05/17/2018  Outpatient follow-up with cardiologist advised  4)Uncontrolled essential HTN- - improved BP control, multifactorial, but mostly due to noncompliance with medication, - c/n Norvasc 10 mg daily, Coreg twice daily, Lasix 40 mg IV twice daily, BiDil 1 tab 3 times daily  5)GI bleed/BRBPR- -  Currently patient refuses colonoscopy., -GI input appreciated, no further bleeding in the last several days.   GI signed off as of 05/16/2018  Patient follow-up with gastroenterologist advised  6)Normocytic anemia- -Anemia panel consistent with anemia of chronic disease/CKD/ESRD  Suspect a combination of AIDS +Renal disease does not appear to have significant GI bleed. ESA/Procrit as per nephrology team  7)Chronic boil of buttocks- -Hx of chronic "boil" of buttock, -I&D in ED.  Culture not sent clinically much improved and actually resolving, may use warm packs  8)Polysubstance abuse-- 05/11/18 UDS positive marijuana  9)Goal of care - -6/26 PALLIATIVE CARE: Noncompliant patient with HIV, acute systolic CHF, acute renal failure possibly going on HD discussed change of status to DNR, discussed short-term vs long-term goals of care  10)CAD--Myoview stress test from 05/14/2018 with reversible ischemia along the septal wall, cardiologist recommends  medical management, concerns about early complications if left heart catheterization is done , also should angioplasty and stenting be done patient would not likely be compliant with dual antiplatelet therapy.  Cardiology service is signing off as of 05/17/2018  11)Disposition---  , significant concerns about patient's ability to be compliant with hemodialysis  ,   outpatient HD arranged for him in the [facility] area where he plans to return home, outpatient hemodialysis scheduled for patient  on 05/28/2018 at 26 AM in [facility]   DVT prophylaxis: SCD Code Status: Full Family Communication: None Disposition Plan:  home Consultants:  Cardiology ID Nephrology GI Palliative IR Rt Neck/Chest tunneled HD catheter placed on 05/17/18 Vascular surgery  Procedures/Significant Events:  6/25 echocardiogram:Left ventricle: LVEF =45% to 50%. Diffuse hypokinesis.- Grade 1 diastolic dysfunction. - Left atrium:  moderately dilated.  05/14/2018 abnormal Myoview stress test  Rt Neck/Chest tunneled HD catheter placed on 05/17/18,  05/24/18---- Had Left first stage Brachiobasilic Fistula, Will need second stage basilic vein transposition, sch appt lvm 07/06/18 3pm Dialysis Duplex 4pm  Cultures 6/24 MRSA PCR negative   Discharge Condition: stable  Follow UP  Follow-up Information    Early, Arvilla Meres, MD In 6 weeks.   Specialties:  Vascular Surgery, Cardiology Why:  Office will call you to arrange your appt (sent) Contact information: [place] [address] [facility]. Go on 05/28/2018.   Why:  Go to above address for appointment with Dr. Quentin Cornwall at 9:30am on Friday 05/28/18.  You will have dialysis following this appointment at 11:30am.  Phone: [phone] Address: [address] [employer], [place] Contact information: Phone: [phone] Address: [address] [place], [place] and Activity recommendation:  As advised  Discharge Instructions    Discharge Instructions    (HEART FAILURE PATIENTS) Call MD:  Anytime you have any of the following symptoms: 1) 3 pound weight gain in 24 hours or 5 pounds in 1 week 2) shortness of breath, with or without a dry hacking cough 3) swelling in the hands, feet or stomach 4) if you have to sleep on extra pillows at night in order to breathe.   Complete by:  As directed    Call MD for:  difficulty breathing, headache or visual disturbances   Complete by:  As directed    Call MD for:  persistant dizziness or light-headedness   Complete by:  As directed    Call MD for:  persistant nausea and vomiting   Complete by:  As directed    Call MD for:  redness, tenderness, or signs of infection (pain, swelling, redness, odor or green/yellow discharge around incision site)   Complete by:  As directed    Call MD for:  severe uncontrolled pain   Complete by:  As directed    Call MD for:  temperature >100.4   Complete by:  As directed    Diet - low sodium heart healthy   Complete by:  As directed    Discharge instructions   Complete by:  As directed    1)[facility] Phone: [phone] Address: [address], [address] [place], [place] to above address for appointment with Dr. Quentin Cornwall at 9:30am on Friday 05/28/18.  You will have dialysis following this appointment at 11:30am on Friday 05/28/18  [facility] Phone: [phone] Address: [address], [address] [place], [place] to above address for appointment with Dr. Quentin Cornwall at 9:30am on Friday 05/28/18.  You will have dialysis following this appointment at 11:30am on Friday 05/28/18   1)[facility] Phone: [phone] Address: [address] [place], [place] to above address for appointment with Dr. Quentin Cornwall at 9:30am on Friday  05/28/18.  You will have dialysis following this appointment at 11:30am on Friday 05/28/18  2)Take all your medications as prescribed  3)Avoid ibuprofen/Advil/Aleve/Motrin/Goody Powders/Naproxen/BC powders/Meloxicam/Diclofenac/Indomethacin and other Nonsteroidal anti-inflammatory medications as these will make you more likely to bleed and can cause stomach ulcers, can also cause Kidney problems.    4))Very low-salt diet advised 5)Weigh yourself daily, call if you gain more than 3 pounds in 1 day or more than 5 pounds in 1 week as your diuretic medications may need to be adjusted 6)Limit your Fluid  intake to no more than 60 ounces (1.8 Liters) per day  7)You need to  establish care with a local primary care provider--- your New local primary care provider will refer you to a cardiologist and an infectious disease/HIV specialist for ongoing management of your heart failure and HIV/AIDS  8)Follow-up with Dr. Donnetta Hutching  the vascular surgeon [phone]) need second stage basilic vein transposition, appointment scheduled for 07/06/18 at  3pm Dialysis Duplex at 4pm on 07/06/18  for the second stage of your Left Arm AV fistula creation to allow for hemodialysis through your graft   Increase activity slowly   Complete by:  As directed         Discharge Medications     Allergies as of 05/26/2018      Reactions   Penicillins Anaphylaxis   Has patient had a PCN reaction causing immediate rash, facial/tongue/throat swelling, SOB or lightheadedness with hypotension: Unknown Has patient had a PCN reaction causing severe rash involving mucus membranes or skin necrosis: Unknown Has patient had a PCN reaction that required hospitalization: Unknown Has patient had a PCN reaction occurring within the last 10 years: No If all of the above answers are "NO", then may proceed with Cephalosporin use.      Medication List    STOP taking these medications   benazepril 5 MG tablet Commonly known as:  LOTENSIN    magic mouthwash Soln   senna-docusate 8.6-50 MG tablet Commonly known as:  Senokot-S   sulfamethoxazole-trimethoprim 400-80 MG tablet Commonly known as:  BACTRIM     TAKE these medications   amLODipine 10 MG tablet Commonly known as:  NORVASC Take 1 tablet (10 mg total) by mouth at bedtime.   aspirin 81 MG chewable tablet Chew 1 tablet (81 mg total) by mouth daily. With Food Start taking on:  05/27/2018   azithromycin 600 MG tablet Commonly known as:  ZITHROMAX Take 2 tablets (1,200 mg total) by mouth once a week. Start taking on:  06/01/2018   bisacodyl 10 MG suppository Commonly known as:  DULCOLAX Place 1 suppository (10 mg total) rectally daily as needed for moderate constipation.   calcium acetate 667 MG capsule Commonly known as:  PHOSLO Take 3 capsules (2,001 mg total) by mouth 3 (three) times daily with meals.   carvedilol 12.5 MG tablet Commonly known as:  COREG Take 1 tablet (12.5 mg total) by mouth 2 (two) times daily with a meal.   dapsone 100 MG tablet Take 1 tablet (100 mg total) by mouth daily.   darunavir-cobicistat 800-150 MG tablet Commonly known as:  PREZCOBIX Take 1 tablet by mouth daily with breakfast. Swallow whole. Do NOT crush, break or chew tablets. Take with food.   dolutegravir 50 MG tablet Commonly known as:  TIVICAY Take 1 tablet (50 mg total) by mouth daily.   fluconazole 100 MG tablet Commonly known as:  DIFLUCAN one tablet each week (Thursdays) What changed:  additional instructions   ondansetron 4 MG tablet Commonly known as:  ZOFRAN Take 1 tablet (4 mg total) by mouth  every 6 (six) hours as needed for nausea.   polyethylene glycol packet Commonly known as:  MIRALAX / GLYCOLAX Take 17 g by mouth daily.   rilpivirine 25 MG Tabs tablet Commonly known as:  EDURANT Take 1 tablet (25 mg total) by mouth daily with breakfast. Start taking on:  05/27/2018       Major procedures and Radiology Reports - PLEASE review detailed  and final reports for all details, in brief -   Dg Chest 2 View  Result Date: 05/10/2018 CLINICAL DATA:  Short of breath EXAM: CHEST - 2 VIEW COMPARISON:  12/26/2009 FINDINGS: The heart size and mediastinal contours are within normal limits. Both lungs are clear. The visualized skeletal structures are unremarkable. IMPRESSION: No active cardiopulmonary disease. Electronically Signed   By: Franchot Gallo M.D.   On: 05/10/2018 15:24   US Renal  Result Date: 05/10/2018 CLINICAL DATA:  AKI, elevated BUN and creatinine. EXAM: RENAL / URINARY TRACT ULTRASOUND COMPLETE COMPARISON:  None. FINDINGS: Right Kidney: Length: 12 cm. RIGHT renal cortex is diffusely echogenic. Probable small cyst measuring 12 mm. No suspicious cortical mass or hydronephrosis seen. Left Kidney: Length: 13.5 cm. LEFT renal cortex is also diffusely echogenic. No suspicious mass or hydronephrosis seen. Bladder: Appears normal for degree of bladder distention. IMPRESSION: 1. Both kidneys are diffusely echogenic indicating medical renal disease. No suspicious mass or hydronephrosis within either kidney. 2. Bladder is unremarkable, partially decompressed. Electronically Signed   By: Franki Cabot M.D.   On: 05/10/2018 21:15   Nm Myocar Multi W/spect W/wall Motion / Ef  Result Date: 05/14/2018 CLINICAL DATA:  57 year old with chest pain. Intermediate/high probability. EXAM: MYOCARDIAL IMAGING WITH SPECT (REST AND PHARMACOLOGIC-STRESS) GATED LEFT VENTRICULAR WALL MOTION STUDY LEFT VENTRICULAR EJECTION FRACTION TECHNIQUE: Standard myocardial SPECT imaging was performed after resting intravenous injection of 10 mCi Tc-67mtetrofosmin. Subsequently, intravenous infusion of Lexiscan was performed under the supervision of the Cardiology staff. At peak effect of the drug, 30 mCi Tc-964metrofosmin was injected intravenously and standard myocardial SPECT imaging was performed. Quantitative gated imaging was also performed to evaluate left ventricular  wall motion, and estimate left ventricular ejection fraction. COMPARISON:  Chest radiograph 05/10/2018 FINDINGS: Perfusion: Mild to moderate reversibility along the septal wall, particularly in the mid segment. Findings are concerning for ischemia in this area. No other areas are concerning for reversibility or ischemia. There is fixed defect along the inferior wall which could be related to an infarct. Wall Motion: Diffuse hypokinesia throughout the left ventricle. Left ventricle is dilated. Left Ventricular Ejection Fraction: 38 % End diastolic volume 17973l End systolic volume 10532l IMPRESSION: 1. Mild to moderate reversibility involving the septal wall. Findings are suggestive for ischemia in this area. 2. Fixed defect along the inferior wall may be related to an infarct. 3. Left ventricular ejection fraction is 38% with diffuse hypokinesia in the left ventricle. 4. Non invasive risk stratification*: Intermediate *2012 Appropriate Use Criteria for Coronary Revascularization Focused Update: J Am Coll Cardiol. 209924;26(8):341-962http://[email or web address]?articleid=[email or web address] Electronically Signed   By: AdMarkus Daft.D.   On: 05/14/2018 14:33   Ir Fluoro Guide Cv Line Right  Result Date: 05/17/2018 INDICATION: End-stage renal disease. In need of durable intravenous access for the initiation of dialysis. EXAM: TUNNELED CENTRAL VENOUS HEMODIALYSIS CATHETER PLACEMENT WITH ULTRASOUND AND FLUOROSCOPIC GUIDANCE MEDICATIONS: Vancomycin 1 gm IV . The antibiotic was given in an appropriate time interval prior to skin puncture. ANESTHESIA/SEDATION: Versed 2 mg IV; Fentanyl 50 mcg IV; Moderate Sedation Time:  17 minutes The patient was continuously monitored during the procedure by the interventional radiology nurse under my direct supervision. FLUOROSCOPY TIME:  Fluoroscopy Time: 24 seconds (5 mGy). COMPLICATIONS: None immediate. PROCEDURE: Informed written consent was obtained from the patient after a  discussion of the risks, benefits, and alternatives to treatment. Questions regarding the procedure were encouraged and answered. The right neck and chest were prepped with chlorhexidine in a sterile fashion, and a sterile drape was applied covering the operative field. Maximum barrier sterile technique with sterile gowns and gloves were used for the procedure. A timeout was performed prior to the initiation of the procedure. After creating a small venotomy incision, a micropuncture kit was utilized to access the internal jugular vein. Real-time ultrasound guidance was utilized for vascular access including the acquisition of a permanent ultrasound image documenting patency of the accessed vessel. The microwire was utilized to measure appropriate catheter length. A stiff Glidewire was advanced to the level of the IVC and the micropuncture sheath was exchanged for a peel-away sheath. A palindrome tunneled hemodialysis catheter measuring 19 cm from tip to cuff was tunneled in a retrograde fashion from the anterior chest wall to the venotomy incision. The catheter was then placed through the peel-away sheath with tips ultimately positioned within the superior aspect of the right atrium. Final catheter positioning was confirmed and documented with a spot radiographic image. The catheter aspirates and flushes normally. The catheter was flushed with appropriate volume heparin dwells. The catheter exit site was secured with a 0-Prolene retention suture. The venotomy incision was closed with an interrupted 4-0 Vicryl, Dermabond and Steri-strips. Dressings were applied. The patient tolerated the procedure well without immediate post procedural complication. IMPRESSION: Successful placement of 19 cm tip to cuff tunneled hemodialysis catheter via the right internal jugular vein with tips terminating within the superior aspect of the right atrium. The catheter is ready for immediate use. Electronically Signed   By: Sandi Mariscal  M.D.   On: 05/17/2018 10:09   Ir US Guide Vasc Access Right  Result Date: 05/17/2018 INDICATION: End-stage renal disease. In need of durable intravenous access for the initiation of dialysis. EXAM: TUNNELED CENTRAL VENOUS HEMODIALYSIS CATHETER PLACEMENT WITH ULTRASOUND AND FLUOROSCOPIC GUIDANCE MEDICATIONS: Vancomycin 1 gm IV . The antibiotic was given in an appropriate time interval prior to skin puncture. ANESTHESIA/SEDATION: Versed 2 mg IV; Fentanyl 50 mcg IV; Moderate Sedation Time:  17 minutes The patient was continuously monitored during the procedure by the interventional radiology nurse under my direct supervision. FLUOROSCOPY TIME:  Fluoroscopy Time: 24 seconds (5 mGy). COMPLICATIONS: None immediate. PROCEDURE: Informed written consent was obtained from the patient after a discussion of the risks, benefits, and alternatives to treatment. Questions regarding the procedure were encouraged and answered. The right neck and chest were prepped with chlorhexidine in a sterile fashion, and a sterile drape was applied covering the operative field. Maximum barrier sterile technique with sterile gowns and gloves were used for the procedure. A timeout was performed prior to the initiation of the procedure. After creating a small venotomy incision, a micropuncture kit was utilized to access the internal jugular vein. Real-time ultrasound guidance was utilized for vascular access including the acquisition of a permanent ultrasound image documenting patency of the accessed vessel. The microwire was utilized to measure appropriate catheter length. A stiff Glidewire was advanced to the level of the IVC and the micropuncture sheath was exchanged for a peel-away sheath. A palindrome tunneled hemodialysis catheter measuring 19 cm from tip to cuff  was tunneled in a retrograde fashion from the anterior chest wall to the venotomy incision. The catheter was then placed through the peel-away sheath with tips ultimately  positioned within the superior aspect of the right atrium. Final catheter positioning was confirmed and documented with a spot radiographic image. The catheter aspirates and flushes normally. The catheter was flushed with appropriate volume heparin dwells. The catheter exit site was secured with a 0-Prolene retention suture. The venotomy incision was closed with an interrupted 4-0 Vicryl, Dermabond and Steri-strips. Dressings were applied. The patient tolerated the procedure well without immediate post procedural complication. IMPRESSION: Successful placement of 19 cm tip to cuff tunneled hemodialysis catheter via the right internal jugular vein with tips terminating within the superior aspect of the right atrium. The catheter is ready for immediate use. Electronically Signed   By: Sandi Mariscal M.D.   On: 05/17/2018 10:09   Dg Abd Portable 1v  Result Date: 05/12/2018 CLINICAL DATA:  Nausea, vomiting, and diarrhea today. Also left lower quadrant pain. History of HIV, lymphoma, CHF. EXAM: PORTABLE ABDOMEN - 1 VIEW COMPARISON:  Abdominal and pelvic CT scan of May 20, 2017 FINDINGS: The bowel gas pattern is within the limits of normal. There are no abnormal soft tissue calcifications. 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 The lumbar spine and visualized portions of the pelvis are unremarkable. IMPRESSION: No acute intra-abdominal or pelvic abnormality is observed. Electronically Signed   By: David  Martinique M.D.   On: 05/12/2018 12:34    Micro Results   No results found for this or any previous visit (from the past 240 hour(s)).     Today   Subjective    [name] today has no further concerns, patient is happy to be going home today, was initially grumpy initially refused labs but later agreed to have labs drawn          Patient has been seen and examined prior to discharge   Objective   Blood pressure 132/79, pulse 71, temperature 98.1 F (36.7 C), temperature source Oral, resp. rate 18,  height 5' 5" (1.651 m), weight 76.5 kg (168 lb 10.4 oz), SpO2 96 %.   Intake/Output Summary (Last 24 hours) at 05/26/2018 1600 Last data filed at 05/26/2018 0820 Gross per 24 hour  Intake 240 ml  Output 3000 ml  Net -2760 ml    Exam  Gen:- Awake Alert, in no apparent distress ambulating up in room without difficulty HEENT:- [place].AT, No sclera icterus Neck-Supple Neck,No JVD, Rt Neck/Chest tunneled HD catheter placed on 05/17/18,  Lungs-   clear to station bilaterally no wheezing, fair air movement CV- S1, S2 normal Abd-  +ve B.Sounds, Abd Soft, No tenderness,    Extremity-no clubbing cyanosis or edema, left antecubital fossa area AV fistula with positive thrill and bruit Psych-affect is appropriate, oriented x3 Neuro-no new focal deficits, no tremors     Data Review   CBC w Diff:  Lab Results  Component Value Date   WBC 7.8 05/26/2018   HGB 7.8 (L) 05/26/2018   HGB 8.9 (L) 05/12/2018   HGB 13.7 11/20/2008   HCT 25.7 (L) 05/26/2018   HCT 40.5 11/20/2008   PLT 257 05/26/2018   PLT 172 11/20/2008   LYMPHOPCT 34 04/23/2017   LYMPHOPCT 38.7 11/20/2008   MONOPCT 15 04/23/2017   MONOPCT 8.2 11/20/2008   EOSPCT 3 04/23/2017   EOSPCT 1.9 11/20/2008   BASOPCT 1 04/23/2017   BASOPCT 0.5 11/20/2008    CMP:  Lab Results  Component  Value Date   NA 136 05/26/2018   K 4.0 05/26/2018   CL 99 05/26/2018   CO2 28 05/26/2018   BUN 31 (H) 05/26/2018   CREATININE 5.48 (H) 05/26/2018   CREATININE 0.67 (L) 04/23/2017   PROT 5.7 (L) 05/13/2018   ALBUMIN 1.9 (L) 05/25/2018   BILITOT 0.5 05/13/2018   ALKPHOS 40 05/13/2018   AST 21 05/13/2018   ALT 13 05/13/2018  .   Total Discharge time is about 33 minutes  Roxan Hockey M.D on 05/26/2018 at 4:00 PM  [facility]   Office  [phone]  Voice Recognition Viviann Spare dictation system was used to create this note, attempts have been made to correct errors. Please contact the author with questions and/or clarifications.

## 2018-05-26 NOTE — Progress Notes (Signed)
Pt seen by MD, orders written for discharge. Pt alert and oriented. He had 4 prescriptions that were returned to patient from pharmacy. RN went over discharge instructions with pt including appointments & prescriptions. RN answered all questions. RN removed both IV's. Escorted for discharge via wheelchair with all belongings. Pt will follow up outpatient with MD and dialysis. Pt was educated on importance of compliancy with medications and dialysis regimen.

## 2018-05-26 NOTE — Consult Note (Signed)
            [facility] CM Primary Care Navigator  05/26/2018  [name] 03-29-1961 [id number]   Went back to see patient at the bedside to identify possible discharge needs buthe wasalreadydischarged per staff.  Patientwas discharged home- he lives in [place] ([place], [place]).  Per chart review, patient wassent from his infectious disease doctor after an appointment, when he developed right-sided chest pain, with radiation to the right arm and nausea, shortness of breath after walking short distance to the parking lot at the office and GI bleed which all led to this admission. (Human immunodeficiency virus (HIV) disease and AKI (acute kidney injury)  Patient has discharge instruction to follow-up with cardiology and infectious disease specialist in the local area post discharge.  Instruction to patient: You need to  establish care with a local primary care provider--- your New local primary care provider will refer you to a cardiologist and an infectious disease/HIV specialist for ongoing management of your heart failure and HIV/AIDS; Outpatient hemodialysis was arranged for him by Inpatient social worker at the [facility] area where he plans to return home, outpatient hemodialysis scheduled for patient on 05/28/2018 at 59 AM in [facility] with [facility].   For additional questions please contact:  Edwena Felty A. Doneta Bayman, BSN, RN-BC [facility] PRIMARY CARE Navigator Cell: [phone]

## 2018-05-26 NOTE — Care Management Note (Signed)
Case Management Note  Patient Details  Name: [name] MRN: [id number] Date of Birth: 05-04-1961  Subjective/Objective:   From [facility], says he can not afford his medications, he has Medicare , states he does not feel like talking right now, for NCM to speak with him later.  7/2 [name] RN, BSN- patient discussed in LOS , per Med Director , patient would Be good candidate for [facility]. NCM will have [facility] reps look at today. ID is alsoaware of [facility] ,Id Mdwas also in LOS. NCMconfirmed with attending as well.Per [facility] rep they do not have any HD beds available, and [name] states that patient does not have a 3 night sdu stay and she will need the revenue codes.Tunneled HD catheter placed on 6/30 and HD started. Hopeful for renal recovery if not will need ongoing HD, conts on iv abx, iv lasix.  7/8 [name] RN, BSN - patient now deemed as ESRD, started HD on 7/2 [facility] placed, for AVF placement today.  Clipping process underway, which may be difficult, he lives in [place].    7/10 [name] RN, BSN - HD has been set up for patient per CSW.  NCM informed MD, patient will be dc today.                    Action/Plan:   Expected Discharge Date:  05/13/18               Expected Discharge Plan:  Home/Self Care  In-House Referral:     Discharge planning Services  CM Consult  Post Acute Care Choice:    Choice offered to:     DME Arranged:    DME Agency:     HH Arranged:    HH Agency:     Status of Service:  Completed, signed off  If discussed at [employer] of Stay Meetings, dates discussed:    Additional Comments:  Zenon Mayo, RN 05/26/2018, 2:51 PM

## 2018-05-26 NOTE — Discharge Instructions (Signed)
[facility] Phone: [phone] Address: [address], [address] [place], [place]  Go to above address for appointment with Dr. Quentin Cornwall at 9:30am on Friday 05/28/18.  You will have dialysis following this appointment at 11:30am on Friday 05/28/18   1)[facility] Phone: [phone] Address: [address] [place], [place] to above address for appointment with Dr. Quentin Cornwall at 9:30am on Friday 05/28/18.  You will have dialysis following this appointment at 11:30am on Friday 05/28/18  2)Take all your medications as prescribed  3)Avoid ibuprofen/Advil/Aleve/Motrin/Goody Powders/Naproxen/BC powders/Meloxicam/Diclofenac/Indomethacin and other Nonsteroidal anti-inflammatory medications as these will make you more likely to bleed and can cause stomach ulcers, can also cause Kidney problems.    4))Very low-salt diet advised 5)Weigh yourself daily, call if you gain more than 3 pounds in 1 day or more than 5 pounds in 1 week as your diuretic medications may need to be adjusted 6)Limit your Fluid  intake to no more than 60 ounces (1.8 Liters) per day  7)You need to  establish care with a local primary care provider--- your New local primary care provider will refer you to a cardiologist and an infectious disease/HIV specialist for ongoing management of your heart failure and HIV/AIDS  8)Follow-up with Dr. Donnetta Hutching  the vascular surgeon [phone]) need second stage basilic vein transposition, appointment scheduled for 07/06/18 at  3pm Dialysis Duplex at 4pm on 07/06/18  for the second stage of your Left Arm AV fistula creation to allow for hemodialysis through your graft     [facility]  Discharge Instructions  AV Fistula or Graft Surgery for Dialysis Access  Please refer to the following instructions for your post-procedure care. Your surgeon or physician assistant will discuss any changes with  you.  Activity  You may drive the day following your surgery, if you are comfortable and no longer taking prescription pain medication. Resume full activity as the soreness in your incision resolves.  Bathing/Showering  You may shower after you go home. Keep your incision dry for 48 hours. Do not soak in a bathtub, hot tub, or swim until the incision heals completely. You may not shower if you have a hemodialysis catheter.  Incision Care  Clean your incision with mild soap and water after 48 hours. Pat the area dry with a clean towel. You do not need a bandage unless otherwise instructed. Do not apply any ointments or creams to your incision. You may have skin glue on your incision. Do not peel it off. It will come off on its own in about one week. Your arm may swell a bit after surgery. To reduce swelling use pillows to elevate your arm so it is above your heart. Your doctor will tell you if you need to lightly wrap your arm with an ACE bandage.  Diet  Resume your normal diet. There are not special food restrictions following this procedure. In order to heal from your surgery, it is CRITICAL to get adequate nutrition. Your body requires vitamins, minerals, and protein. Vegetables are the best source of vitamins and minerals. Vegetables also provide the perfect balance of protein. Processed food has little nutritional value, so try to avoid this.  Medications  Resume taking all of your medications. If your incision is causing pain, you may take over-the counter pain relievers such as acetaminophen (Tylenol). If you were prescribed a stronger pain medication, please be aware these medications can cause nausea and constipation. Prevent nausea by taking the medication  with a snack or meal. Avoid constipation by drinking plenty of fluids and eating foods with high amount of fiber, such as fruits, vegetables, and grains.  Do not take Tylenol if you are taking prescription pain medications.  Follow  up Your surgeon may want to see you in the office following your access surgery. If so, this will be arranged at the time of your surgery.  Please call us immediately for any of the following conditions:  Increased pain, redness, drainage (pus) from your incision site Fever of 101 degrees or higher Severe or worsening pain at your incision site Hand pain or numbness.  Reduce your risk of vascular disease:  Stop smoking. If you would like help, call [facility] at 1-800-QUIT-NOW (507)467-1937) or [place] at [place] your cholesterol Maintain a desired weight Control your diabetes Keep your blood pressure down  Dialysis  It will take several weeks to several months for your new dialysis access to be ready for use. Your surgeon will determine when it is okay to use it. Your nephrologist will continue to direct your dialysis. You can continue to use your Permcath until your new access is ready for use.   05/24/2018 [name] [id number] 30-Mar-1961  Surgeon(s): Early, Arvilla Meres, MD  Procedure(s): FIRST STAGE BASILIC VEIN TRANSPOSITION  x Do not stick fistula for 12 weeks    If you have any questions, please call the office at [phone].   [facility] Phone: [phone] Address: [address], [address] [place], [place]  Go to above address for appointment with Dr. Quentin Cornwall at 9:30am on Friday 05/28/18.  You will have dialysis following this appointment at 11:30am on Friday 05/28/18  [facility] Phone: [phone] Address: [address], [address] [place], [place] to above address for appointment with Dr. Quentin Cornwall at 9:30am on Friday 05/28/18.  You will have dialysis following this appointment at 11:30am on Friday 05/28/18   1)[facility] Phone: [phone] Address: [address], [address] [place], [place] to above address for appointment with Dr. Quentin Cornwall at  9:30am on Friday 05/28/18.  You will have dialysis following this appointment at 11:30am on Friday 05/28/18  2)Take all your medications as prescribed  3)Avoid ibuprofen/Advil/Aleve/Motrin/Goody Powders/Naproxen/BC powders/Meloxicam/Diclofenac/Indomethacin and other Nonsteroidal anti-inflammatory medications as these will make you more likely to bleed and can cause stomach ulcers, can also cause Kidney problems.   4))Very low-salt diet advised 5)Weigh yourself daily, call if you gain more than 3 pounds in 1 day or more than 5 pounds in 1 week as your diuretic medications may need to be adjusted 6)Limit your Fluid  intake to no more than 60 ounces (1.8 Liters) per day  7)You need to  establish care with a local primary care provider--- your New local primary care provider will refer you to a cardiologist and an infectious disease/HIV specialist for ongoing management of your heart failure and HIV/AIDS  8)Follow-up with Dr. Donnetta Hutching  the vascular surgeon [phone]) need second stage basilic vein transposition, appointment scheduled for 07/06/18 at  3pm Dialysis Duplex at 4pm on 07/06/18  for the second stage of your Left Arm AV fistula creation to allow for hemodialysis through your graft

## 2018-05-26 NOTE — Progress Notes (Signed)
Patient refused morning labs from two techs and nurse x2.Marland Kitchen

## 2018-05-26 NOTE — Progress Notes (Signed)
Patient ID: [name], male   DOB: 12/13/1960, 57 y.o.   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 [name] on 05/10/2018 w/ right-sided chest pain with radiation to the right arm, nausea, and SOB after walking a short distance.  He also reported B LE swelling, paroxysmal nocturnal dyspnea, and bright red blood per rectum for a weeks duration.  Rt Neck/Chest tunneled HD catheter placed on 05/17/18, first HD session on 05/18/18.. CLIP process underway but will be difficult as he lives in [place] and has not seen a nephrologist there before.    Subjective:-Grumpy, refused labs, initially, now agreeable to labs after long conversation   Plan:-  1)Acute kidney injury superimposed on progressive CKD V/ ESRD-   in the setting of uncontrolled hypertension and noncompliance with HIV medications (hypertensive nephropathy And HIV nephropathy)- creatinine was >  8,  -Discussed case with nephrologist  ,   Rt Neck/Chest tunneled HD catheter placed on 05/17/18 (initiated on HD on 05/18/18), patient now appears to be in ESRD according to Nephrologist. Please see Nephrology progress note dated 05/21/2018 . cont with TTS schedule, CLIP process underway but will  be difficult as he lives in [place] and has not seen a nephrologist there before. There is significant concerns about patient's ability to be compliant with hemodialysis  , will need outpatient HD arranged for him in the [place] area where he plans to return home.  05/24/18---- Had Left first stage Brachiobasilic Fistula, Will need second stage basilic vein transposition, sch appt lvm 07/06/18 3pm Dialysis Duplex 4pm - Avoid nephrotoxic medication  Recent Labs  Lab 05/20/18 0156 05/22/18 0610 05/24/18 0754 05/25/18 1337 05/26/18 1208  CREATININE 6.55* 7.22* 6.79* 7.52* 5.48*   2)HIV/AIDS- - Noncompliant with medication.- - HIV medications ( Dr Van Dam changed to a more renal based regimen of TIVICAY PREZCOBIX and RPV)  per ID,   CD4 count was 50,   ID consult appreciated,  HLA B701 ordered,  -Azithromycin 1200 mg weekly (q Tuesday), dapsone 100 mg weekly (q Thursday),  , -Prezcobix 800-150 mg daily, -Tivicay 50 mg daily, -Edurant 25 mg daily  Infectious disease service is signing off as of 05/16/2018   3)Dilated cardiomyopathy with acute systolic and diastolic (Combined) CHF/chest pain.-Overall stable at this time, appears compensated, last known EF is 45 to 50%, low-salt diet advised, continue to use hemodialysis to address volume status,  - Transfuse as clinically indicated, -Cardiology consult appreciated, Myoview stress test on 05/14/2017 is abnormal, Cardiology consulted recommended medical management only, No further invasive work-up, , c/n Amlodipine 10 mg daily, BiDil,- Lasix  , C/n coreg 25   mg twice daily, lasix -  ,  aspirin. Hold off on statin due to HAART and advanced renal disease.    Cardiology service is signing off as of 05/17/2018  4)Uncontrolled essential HTN- - improved BP control, multifactorial, but mostly due to noncompliance with medication, - c/n Norvasc 10 mg daily, Coreg twice daily, Lasix 40 mg IV twice daily, BiDil 1 tab 3 times daily   5)GI  bleed/BRBPR- -  Currently patient refuses colonoscopy., -GI input appreciated, no further bleeding in the last several days.    GI signed off as of 05/16/2018  6)Normocytic anemia- -Anemia panel consistent with anemia of chronic disease/CKD/ESRD  Suspect a combination of AIDS +Renal disease does not appear to have significant GI bleed. ESA/Procrit as per nephrology team  7)Chronic boil of buttocks- -Hx of chronic "boil" of buttock, -I&D in ED.  Culture not sent   8)Polysubstance abuse-- 05/11/18 UDS positive marijuana  9)Goal of care - -6/26 PALLIATIVE CARE: Noncompliant patient with HIV, acute systolic CHF, acute renal failure possibly going on HD discussed change of status to DNR, discussed short-term vs long-term goals of care  10)CAD--Myoview stress test from 05/14/2018 with reversible ischemia along the septal wall, cardiologist recommends medical management, concerns about early complications if left heart catheterization is done , also should angioplasty and stenting be done patient would not likely be compliant with dual antiplatelet therapy.  Cardiology service is signing off as of 05/17/2018  11)Disposition--- patient will need outpatient hemodialysis chair/spot assigned prior to discharge home , significant concerns about patient's ability to be compliant with hemodialysis  , will need outpatient HD arranged for him in the [facility] area where he plans to return home, Cont with TTS schedule, CLIP process underway but will be difficult as he lives in [facility] and has not seen a nephrologist there before.     DVT prophylaxis: SCD Code Status: Full Family Communication: None Disposition Plan: Dialysis access with vascular surgery will be done Monday if patient agreeable  Consultants:  Cardiology ID Nephrology GI Palliative IR Rt Neck/Chest tunneled HD catheter placed on 05/17/18 Vascular surgery  Procedures/Significant Events:  6/25 echocardiogram:Left ventricle: LVEF =45% to  50%. Diffuse hypokinesis.- Grade 1   diastolic dysfunction. - Left atrium:  moderately dilated.  05/14/2018 abnormal Myoview stress test  Rt Neck/Chest tunneled HD catheter placed on 05/17/18,  05/24/18---- Had Left first stage Brachiobasilic Fistula, Will need second stage basilic vein transposition, sch appt lvm 07/06/18 3pm Dialysis Duplex 4pm  Cultures 6/24 MRSA PCR negative  Antimicrobials: Anti-infectives (From admission, onward)   Start     Stop   05/12/18 0730  darunavir-cobicistat (PREZCOBIX) 800-150 MG per tablet 1 tablet         05/12/18 0730  dolutegravir (TIVICAY) tablet 50 mg         05/12/18 0730  rilpivirine (EDURANT) tablet 25 mg         05/12/18 0700  dolutegravir (TIVICAY) tablet 50 mg  Status:  Discontinued     05/12/18 0047   05/12/18 0700  rilpivirine (EDURANT) tablet 25 mg  Status:  Discontinued     05/12/18 0047   05/12/18 0700  darunavir-cobicistat (PREZCOBIX) 800-150 MG per tablet 1 tablet  Status:  Discontinued     05/12/18 0047   05/11/18 1015  azithromycin (ZITHROMAX) tablet 1,200 mg         05/11/18 1015  sulfamethoxazole-trimethoprim (BACTRIM,SEPTRA) 400-80 MG per tablet 1 tablet  Status:  Discontinued     05/11/18 1228  05/11/18 0800  elvitegravir-cobicistat-emtricitabine-tenofovir (GENVOYA) 150-150-200-10 MG tablet 1 tablet  Status:  Discontinued     05/10/18 1931   05/11/18 0800  darunavir (PREZISTA) tablet 800 mg  Status:  Discontinued     05/10/18 1931       Continuous Infusions: . sodium chloride Stopped (05/12/18 1526)  . sodium chloride    . sodium chloride      Objective: Vitals:   05/25/18 1840 05/25/18 2130 05/25/18 2353 05/26/18 0837  BP: 139/79 (!) 159/81 (!) 144/79 132/79  Pulse: 75 84 74 71  Resp: 16 18 17 18  Temp:  99.6 F (37.6 C) 99.2 F (37.3 C) 98.1 F (36.7 C)  TempSrc:  Oral Oral Oral  SpO2:  94% 96% 96%  Weight:      Height:        Intake/Output Summary (Last 24 hours) at 05/26/2018 1351 Last data filed at  05/26/2018 0820 Gross per 24 hour  Intake 240 ml  Output 3000 ml  Net -2760 ml   Filed Weights   05/25/18 0431 05/25/18 1310 05/25/18 1730  Weight: 81.7 kg (180 lb 1.9 oz) 79.5 kg (175 lb 4.3 oz) 76.5 kg (168 lb 10.4 oz)    Examination:  Physical Exam  Gen:- Awake Alert, in no apparent distress ambulating up in room without difficulty HEENT:- [place].AT, No sclera icterus Neck-Supple Neck,No JVD, Rt Neck/Chest tunneled HD catheter placed on 05/17/18,  Lungs-   clear to station bilaterally no wheezing, fair air movement CV- S1, S2 normal Abd-  +ve B.Sounds, Abd Soft, No tenderness,    Extremity-no clubbing cyanosis or edema, left antecubital fossa area AV fistula with positive thrill and bruit Psych-affect is appropriate, oriented x3 Neuro-no new focal deficits, no tremors   CBC: Recent Labs  Lab 05/20/18 0156 05/22/18 0610 05/24/18 0754 05/25/18 1337 05/26/18 1208  WBC 8.0 9.5 8.3 9.4 7.8  HGB 7.7* 8.4* 7.9* 7.9* 7.8*  HCT 24.6* 28.0* 25.6* 26.6* 25.7*  MCV 89.1 91.8 89.2 89.9 88.9  PLT 211 239 197 307 257   Basic Metabolic Panel: Recent Labs  Lab 05/20/18 0156 05/21/18 0600 05/22/18 0610 05/23/18 0552 05/24/18 0754 05/25/18 1337 05/26/18 1208  NA 139  --  138  --  141 137 136  K 3.8  --  4.5  --  4.0 3.7 4.0  CL 103  --  99  --  102 98 99  CO2 27  --  28  --  29 26 28  GLUCOSE 95  --  88  --  94 103* 84  BUN 42*  --  41*  --  36* 46* 31*  CREATININE 6.55*  --  7.22*  --  6.79* 7.52* 5.48*  CALCIUM 8.1*  --  8.6*  --  8.5* 8.6* 8.7*  MG 1.8 2.0 1.9 1.9 1.8 2.1  --   PHOS  --   --  5.3*  --  6.3* 6.6*  --    GFR: Estimated Creatinine Clearance: 14.4 mL/min (A) (by C-G formula based on SCr of 5.48 mg/dL (H)). Liver Function Tests: Recent Labs  Lab 05/22/18 0610 05/24/18 0754 05/25/18 1337  ALBUMIN 2.1* 2.0* 1.9*   No results for input(s): LIPASE, AMYLASE in the last 168 hours. No results for input(s): AMMONIA in the last 168 hours. Coagulation  Profile: No results for input(s): INR, PROTIME in the last 168 hours. Cardiac Enzymes: No results for input(s): CKTOTAL, CKMB, CKMBINDEX, TROPONINI in the last 168 hours. BNP (last 3 results) No results   for input(s): PROBNP in the last 8760 hours. HbA1C: No results for input(s): HGBA1C in the last 72 hours. CBG: Recent Labs  Lab 05/24/18 1234  GLUCAP 83   Lipid Profile: No results for input(s): CHOL, HDL, LDLCALC, TRIG, CHOLHDL, LDLDIRECT in the last 72 hours. Thyroid Function Tests: No results for input(s): TSH, T4TOTAL, FREET4, T3FREE, THYROIDAB in the last 72 hours. Anemia Panel: No results for input(s): VITAMINB12, FOLATE, FERRITIN, TIBC, IRON, RETICCTPCT in the last 72 hours. Urine analysis:    Component Value Date/Time   COLORURINE YELLOW 05/11/2018 0606   APPEARANCEUR HAZY (A) 05/11/2018 0606   LABSPEC 1.012 05/11/2018 0606   PHURINE 5.0 05/11/2018 0606   GLUCOSEU NEGATIVE 05/11/2018 0606   GLUCOSEU NEG mg/dL 11/23/2006 2036   HGBUR MODERATE (A) 05/11/2018 0606   BILIRUBINUR NEGATIVE 05/11/2018 0606   KETONESUR NEGATIVE 05/11/2018 0606   PROTEINUR >=300 (A) 05/11/2018 0606   UROBILINOGEN 0.2 11/23/2006 2036   NITRITE NEGATIVE 05/11/2018 0606   LEUKOCYTESUR NEGATIVE 05/11/2018 0606   Sepsis Labs: @LABRCNTIP(procalcitonin:4,lacticidven:4)  ) No results found for this or any previous visit (from the past 240 hour(s)).  Radiology Studies: No results found.  Scheduled Meds: . amLODipine  10 mg Oral QHS  . aspirin  81 mg Oral Daily  . azithromycin  1,200 mg Oral Weekly  . calcium acetate  2,001 mg Oral TID WC  . carvedilol  12.5 mg Oral BID WC  . Chlorhexidine Gluconate Cloth  6 each Topical Q0600  . dapsone  100 mg Oral Daily  . darbepoetin (ARANESP) injection - DIALYSIS  200 mcg Intravenous Q Tue-HD  . darunavir-cobicistat  1 tablet Oral Q breakfast  . dolutegravir  50 mg Oral Q breakfast  . heparin  40 Units/kg Dialysis Once in dialysis  . rilpivirine  25  mg Oral Q breakfast  . senna-docusate  1 tablet Oral Daily   Continuous Infusions: . sodium chloride Stopped (05/12/18 1526)  . sodium chloride    . sodium chloride       LOS: 16 days     , MD [facility]    If 7PM-7AM, please contact night-coverage [email or web address] Password [email or web address] 05/26/2018, 1:51 PM   Patient ID: Suvan A Smylie, male   DOB: 05/06/1961, 56 y.o.   MRN: [id number]  

## 2018-05-26 NOTE — Progress Notes (Signed)
CSW assisting with finalizing dialysis clipping process.  CSW faxed dialysis flowsheets to [facility] for review.  Their physician has approved pt for dialysis and pt will have first session this Friday 05/28/18- appt follow up  information put in AVS.  Jorge Ny, [place] Social Worker [phone]

## 2018-05-27 NOTE — Telephone Encounter (Signed)
Spoke with patient and he states he was discharged from the ospital yesterday 05/26/18---he will call back later today to schedule his 4 week post hospital visit

## 2018-05-28 DIAGNOSIS — D509 Iron deficiency anemia, unspecified: Secondary | ICD-10-CM | POA: Diagnosis not present

## 2018-05-28 DIAGNOSIS — N186 End stage renal disease: Secondary | ICD-10-CM | POA: Diagnosis not present

## 2018-05-28 DIAGNOSIS — Z992 Dependence on renal dialysis: Secondary | ICD-10-CM | POA: Diagnosis not present

## 2018-05-28 DIAGNOSIS — Z23 Encounter for immunization: Secondary | ICD-10-CM | POA: Diagnosis not present

## 2018-05-28 DIAGNOSIS — D631 Anemia in chronic kidney disease: Secondary | ICD-10-CM | POA: Diagnosis not present

## 2018-05-28 DIAGNOSIS — N2581 Secondary hyperparathyroidism of renal origin: Secondary | ICD-10-CM | POA: Diagnosis not present

## 2018-05-30 LAB — QUANTIFERON-TB GOLD PLUS (RQFGPL)
QUANTIFERON TB1 AG VALUE: 0.05 [IU]/mL
QuantiFERON Mitogen Value: >10 IU/mL
QuantiFERON Nil Value: 0.04 IU/mL
QuantiFERON TB2 Ag Value: 0.04 IU/mL

## 2018-05-30 LAB — QUANTIFERON-TB GOLD PLUS: QUANTIFERON-TB GOLD PLUS: NEGATIVE

## 2018-05-31 DIAGNOSIS — D509 Iron deficiency anemia, unspecified: Secondary | ICD-10-CM | POA: Diagnosis not present

## 2018-05-31 DIAGNOSIS — N2581 Secondary hyperparathyroidism of renal origin: Secondary | ICD-10-CM | POA: Diagnosis not present

## 2018-05-31 DIAGNOSIS — Z992 Dependence on renal dialysis: Secondary | ICD-10-CM | POA: Diagnosis not present

## 2018-05-31 DIAGNOSIS — Z23 Encounter for immunization: Secondary | ICD-10-CM | POA: Diagnosis not present

## 2018-05-31 DIAGNOSIS — N186 End stage renal disease: Secondary | ICD-10-CM | POA: Diagnosis not present

## 2018-05-31 DIAGNOSIS — D631 Anemia in chronic kidney disease: Secondary | ICD-10-CM | POA: Diagnosis not present

## 2018-06-02 DIAGNOSIS — Z23 Encounter for immunization: Secondary | ICD-10-CM | POA: Diagnosis not present

## 2018-06-02 DIAGNOSIS — N186 End stage renal disease: Secondary | ICD-10-CM | POA: Diagnosis not present

## 2018-06-02 DIAGNOSIS — N2581 Secondary hyperparathyroidism of renal origin: Secondary | ICD-10-CM | POA: Diagnosis not present

## 2018-06-02 DIAGNOSIS — D509 Iron deficiency anemia, unspecified: Secondary | ICD-10-CM | POA: Diagnosis not present

## 2018-06-02 DIAGNOSIS — Z992 Dependence on renal dialysis: Secondary | ICD-10-CM | POA: Diagnosis not present

## 2018-06-02 DIAGNOSIS — D631 Anemia in chronic kidney disease: Secondary | ICD-10-CM | POA: Diagnosis not present

## 2018-06-03 LAB — HIV-1 RNA, PCR (GRAPH) RFX/GENO EDI
HIV-1 RNA BY PCR: 8390 {copies}/mL
HIV-1 RNA Quant, Log: 3.924 log10copy/mL

## 2018-06-03 LAB — HIV GENOSURE(R) MG

## 2018-06-03 LAB — REFLEX TO GENOSURE(R) MG EDI: HIV GenoSure(R): 1

## 2018-06-04 DIAGNOSIS — D631 Anemia in chronic kidney disease: Secondary | ICD-10-CM | POA: Diagnosis not present

## 2018-06-04 DIAGNOSIS — N2581 Secondary hyperparathyroidism of renal origin: Secondary | ICD-10-CM | POA: Diagnosis not present

## 2018-06-04 DIAGNOSIS — Z23 Encounter for immunization: Secondary | ICD-10-CM | POA: Diagnosis not present

## 2018-06-04 DIAGNOSIS — D509 Iron deficiency anemia, unspecified: Secondary | ICD-10-CM | POA: Diagnosis not present

## 2018-06-04 DIAGNOSIS — Z992 Dependence on renal dialysis: Secondary | ICD-10-CM | POA: Diagnosis not present

## 2018-06-04 DIAGNOSIS — N186 End stage renal disease: Secondary | ICD-10-CM | POA: Diagnosis not present

## 2018-06-07 DIAGNOSIS — D509 Iron deficiency anemia, unspecified: Secondary | ICD-10-CM | POA: Diagnosis not present

## 2018-06-07 DIAGNOSIS — D631 Anemia in chronic kidney disease: Secondary | ICD-10-CM | POA: Diagnosis not present

## 2018-06-07 DIAGNOSIS — Z992 Dependence on renal dialysis: Secondary | ICD-10-CM | POA: Diagnosis not present

## 2018-06-07 DIAGNOSIS — Z23 Encounter for immunization: Secondary | ICD-10-CM | POA: Diagnosis not present

## 2018-06-07 DIAGNOSIS — N2581 Secondary hyperparathyroidism of renal origin: Secondary | ICD-10-CM | POA: Diagnosis not present

## 2018-06-07 DIAGNOSIS — N186 End stage renal disease: Secondary | ICD-10-CM | POA: Diagnosis not present

## 2018-06-09 DIAGNOSIS — Z23 Encounter for immunization: Secondary | ICD-10-CM | POA: Diagnosis not present

## 2018-06-09 DIAGNOSIS — N186 End stage renal disease: Secondary | ICD-10-CM | POA: Diagnosis not present

## 2018-06-09 DIAGNOSIS — N2581 Secondary hyperparathyroidism of renal origin: Secondary | ICD-10-CM | POA: Diagnosis not present

## 2018-06-09 DIAGNOSIS — Z992 Dependence on renal dialysis: Secondary | ICD-10-CM | POA: Diagnosis not present

## 2018-06-09 DIAGNOSIS — D631 Anemia in chronic kidney disease: Secondary | ICD-10-CM | POA: Diagnosis not present

## 2018-06-09 DIAGNOSIS — D509 Iron deficiency anemia, unspecified: Secondary | ICD-10-CM | POA: Diagnosis not present

## 2018-06-09 NOTE — Progress Notes (Deleted)
Cardiology Office Note   Date:  06/09/2018   ID:  CHOSEN GESKE, DOB 08/06/1961, MRN [id number]  PCP:  [name], MD  Cardiologist: Hochrein No chief complaint on file.    History of Present Illness: [name] is a 57 y.o. male who presents for ongoing assessment and management of uncontrolled HTN, seen during recent hospitalization by Dr. Debara Pickett for chest pain. Other history includes HIV disease, non-compliance with antiviral therapy, T-cell lymphoma treated with radiation, remote history of polysubstance with tobacco, ETOH, and cocaine.   On admission he was found to have acute kidney injury and anemia. Creatinine 5.82 and Hgb of 7.2. ,He was placed on hemodialysis during admission. He was scheduled for a [employer] which revealed a mild to moderate reversibility involving the septal wall, fixed defect along the inferior wall, with LVEF of 38%. Intermediate risk.    Echo revealed dilated cardiomyopathy with EF of 45%-50%, moderate LVH, and Grade I diastolic dysfunction.     Past Medical History:  Diagnosis Date  . 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 Candidiasis of mouth   . Cardiomyopathy ([place])   . Chest pain, unspecified   . Diseases of lips    Angular cheilitis  . HIV infection ([place])   . Hypertension   . Lymphoma ([place])   . Personal history of unspecified disease of respiratory system   . 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 Shingles   . Tobacco use     Past Surgical History:  Procedure Laterality Date  . BASCILIC VEIN TRANSPOSITION Left 05/24/2018   Procedure: FIRST STAGE [place];  Surgeon: Rosetta Posner, MD;  Location: [facility];  Service: Vascular;  Laterality: Left;  . IR FLUORO GUIDE CV LINE RIGHT  05/17/2018  . IR US GUIDE VASC ACCESS RIGHT  05/17/2018     Current Outpatient Medications  Medication Sig Dispense Refill  . amLODipine (NORVASC) 10 MG tablet Take 1 tablet (10 mg total) by mouth at bedtime. 30 tablet 3  .  aspirin 81 MG chewable tablet Chew 1 tablet (81 mg total) by mouth daily. With Food 30 tablet 5  . azithromycin (ZITHROMAX) 600 MG tablet Take 2 tablets (1,200 mg total) by mouth once a week. 10 tablet 5  . bisacodyl (DULCOLAX) 10 MG suppository Place 1 suppository (10 mg total) rectally daily as needed for moderate constipation. 30 suppository 1  . calcium acetate (PHOSLO) 667 MG capsule Take 3 capsules (2,001 mg total) by mouth 3 (three) times daily with meals. 90 capsule 5  . carvedilol (COREG) 12.5 MG tablet Take 1 tablet (12.5 mg total) by mouth 2 (two) times daily with a meal. 60 tablet 1  . dapsone 100 MG tablet Take 1 tablet (100 mg total) by mouth daily. 30 tablet 5  . darunavir-cobicistat (PREZCOBIX) 800-150 MG tablet Take 1 tablet by mouth daily with breakfast. Swallow whole. Do NOT crush, break or chew tablets. Take with food. 30 tablet 5  . dolutegravir (TIVICAY) 50 MG tablet Take 1 tablet (50 mg total) by mouth daily. 30 tablet 5  . fluconazole (DIFLUCAN) 100 MG tablet one tablet each week (Thursdays) 20 tablet 0  . ondansetron (ZOFRAN) 4 MG tablet Take 1 tablet (4 mg total) by mouth every 6 (six) hours as needed for nausea. 20 tablet 0  . polyethylene glycol (MIRALAX / GLYCOLAX) packet Take 17 g by mouth daily. 30 each 1  . rilpivirine (EDURANT) 25 MG TABS tablet  Take 1 tablet (25 mg total) by mouth daily with breakfast. 30 tablet 5   Current Facility-Administered Medications  Medication Dose Route Frequency Provider Last Rate Last Dose  . darunavir (PREZISTA) tablet 800 mg  800 mg Oral Q breakfast Emokpae, Courage, MD        Allergies:   Penicillins    Social History:  The patient  reports that he quit smoking about 2 years ago. He has a 35.00 pack-year smoking history. He has never used smokeless tobacco. He reports that he drinks alcohol. He reports that he does not use drugs.   Family History:  The patient's family history includes CAD in his sister; Cancer in his mother;  Diabetes in his maternal grandmother, mother, and sister; Heart disease in his mother; Hypertension in his brother.    ROS: All other systems are reviewed and negative. Unless otherwise mentioned in H&P    PHYSICAL EXAM: VS:  There were no vitals taken for this visit. , BMI There is no height or weight on file to calculate BMI. GEN: Well nourished, well developed, in no acute distress  HEENT: normal  Neck: no JVD, carotid bruits, or masses Cardiac: ***RRR; no murmurs, rubs, or gallops,no edema  Respiratory:  clear to auscultation bilaterally, normal work of breathing GI: soft, nontender, nondistended, + BS MS: no deformity or atrophy  Skin: warm and dry, no rash Neuro:  Strength and sensation are intact Psych: euthymic mood, full affect   EKG:  EKG {ACTION; IS/IS EGB:[id number]} ordered today. The ekg ordered today demonstrates ***   Recent Labs: 05/10/2018: B Natriuretic Peptide 788.2; TSH 4.213 05/13/2018: ALT 13 05/25/2018: Magnesium 2.1 05/26/2018: BUN 31; Creatinine, Ser 5.48; Hemoglobin 7.8; Platelets 257; Potassium 4.0; Sodium 136    Lipid Panel    Component Value Date/Time   CHOL 162 04/23/2017 1728   TRIG 97 04/23/2017 1728   HDL 46 04/23/2017 1728   CHOLHDL 3.5 04/23/2017 1728   VLDL 19 04/23/2017 1728   LDLCALC 97 04/23/2017 1728      Wt Readings from Last 3 Encounters:  05/25/18 168 lb 10.4 oz (76.5 kg)  05/20/17 167 lb (75.8 kg)  04/23/17 168 lb (76.2 kg)      Other studies Reviewed:  Echocardiogram 05/11/2018 Left ventricle: The cavity size was normal. Wall thickness was   increased in a pattern of moderate LVH. Systolic function was   mildly reduced. The estimated ejection fraction was in the range   of 45% to 50%. Diffuse hypokinesis. Doppler parameters are   consistent with abnormal left ventricular relaxation (grade 1   diastolic dysfunction). - Left atrium: The atrium was moderately dilated. - Pericardium, extracardiac: A trivial pericardial  effusion was   identified.  ASSESSMENT AND PLAN:  1.  ***   Current medicines are reviewed at length with the patient today.    Labs/ tests ordered today include: *** [name], ANP, AACC   06/09/2018 3:07 PM    [facility] [address], [place], [place] [address] Phone: [phone]; Fax: [phone]

## 2018-06-10 ENCOUNTER — Ambulatory Visit: Payer: Medicare Other | Admitting: Adult Health

## 2018-06-11 ENCOUNTER — Encounter: Payer: Self-pay | Admitting: *Deleted

## 2018-06-11 DIAGNOSIS — D509 Iron deficiency anemia, unspecified: Secondary | ICD-10-CM | POA: Diagnosis not present

## 2018-06-11 DIAGNOSIS — Z992 Dependence on renal dialysis: Secondary | ICD-10-CM | POA: Diagnosis not present

## 2018-06-11 DIAGNOSIS — Z23 Encounter for immunization: Secondary | ICD-10-CM | POA: Diagnosis not present

## 2018-06-11 DIAGNOSIS — N2581 Secondary hyperparathyroidism of renal origin: Secondary | ICD-10-CM | POA: Diagnosis not present

## 2018-06-11 DIAGNOSIS — D631 Anemia in chronic kidney disease: Secondary | ICD-10-CM | POA: Diagnosis not present

## 2018-06-11 DIAGNOSIS — N186 End stage renal disease: Secondary | ICD-10-CM | POA: Diagnosis not present

## 2018-06-14 DIAGNOSIS — N2581 Secondary hyperparathyroidism of renal origin: Secondary | ICD-10-CM | POA: Diagnosis not present

## 2018-06-14 DIAGNOSIS — D631 Anemia in chronic kidney disease: Secondary | ICD-10-CM | POA: Diagnosis not present

## 2018-06-14 DIAGNOSIS — Z23 Encounter for immunization: Secondary | ICD-10-CM | POA: Diagnosis not present

## 2018-06-14 DIAGNOSIS — Z992 Dependence on renal dialysis: Secondary | ICD-10-CM | POA: Diagnosis not present

## 2018-06-14 DIAGNOSIS — D509 Iron deficiency anemia, unspecified: Secondary | ICD-10-CM | POA: Diagnosis not present

## 2018-06-14 DIAGNOSIS — N186 End stage renal disease: Secondary | ICD-10-CM | POA: Diagnosis not present

## 2018-06-16 DIAGNOSIS — D631 Anemia in chronic kidney disease: Secondary | ICD-10-CM | POA: Diagnosis not present

## 2018-06-16 DIAGNOSIS — Z23 Encounter for immunization: Secondary | ICD-10-CM | POA: Diagnosis not present

## 2018-06-16 DIAGNOSIS — Z992 Dependence on renal dialysis: Secondary | ICD-10-CM | POA: Diagnosis not present

## 2018-06-16 DIAGNOSIS — N2581 Secondary hyperparathyroidism of renal origin: Secondary | ICD-10-CM | POA: Diagnosis not present

## 2018-06-16 DIAGNOSIS — N186 End stage renal disease: Secondary | ICD-10-CM | POA: Diagnosis not present

## 2018-06-16 DIAGNOSIS — D509 Iron deficiency anemia, unspecified: Secondary | ICD-10-CM | POA: Diagnosis not present

## 2018-06-17 DIAGNOSIS — Z992 Dependence on renal dialysis: Secondary | ICD-10-CM | POA: Diagnosis not present

## 2018-06-17 DIAGNOSIS — N186 End stage renal disease: Secondary | ICD-10-CM | POA: Diagnosis not present

## 2018-06-18 DIAGNOSIS — D509 Iron deficiency anemia, unspecified: Secondary | ICD-10-CM | POA: Diagnosis not present

## 2018-06-18 DIAGNOSIS — N186 End stage renal disease: Secondary | ICD-10-CM | POA: Diagnosis not present

## 2018-06-18 DIAGNOSIS — Z23 Encounter for immunization: Secondary | ICD-10-CM | POA: Diagnosis not present

## 2018-06-18 DIAGNOSIS — D631 Anemia in chronic kidney disease: Secondary | ICD-10-CM | POA: Diagnosis not present

## 2018-06-18 DIAGNOSIS — Z992 Dependence on renal dialysis: Secondary | ICD-10-CM | POA: Diagnosis not present

## 2018-06-21 DIAGNOSIS — D509 Iron deficiency anemia, unspecified: Secondary | ICD-10-CM | POA: Diagnosis not present

## 2018-06-21 DIAGNOSIS — N186 End stage renal disease: Secondary | ICD-10-CM | POA: Diagnosis not present

## 2018-06-21 DIAGNOSIS — Z23 Encounter for immunization: Secondary | ICD-10-CM | POA: Diagnosis not present

## 2018-06-21 DIAGNOSIS — D631 Anemia in chronic kidney disease: Secondary | ICD-10-CM | POA: Diagnosis not present

## 2018-06-21 DIAGNOSIS — Z992 Dependence on renal dialysis: Secondary | ICD-10-CM | POA: Diagnosis not present

## 2018-06-23 DIAGNOSIS — Z992 Dependence on renal dialysis: Secondary | ICD-10-CM | POA: Diagnosis not present

## 2018-06-23 DIAGNOSIS — N186 End stage renal disease: Secondary | ICD-10-CM | POA: Diagnosis not present

## 2018-06-23 DIAGNOSIS — Z23 Encounter for immunization: Secondary | ICD-10-CM | POA: Diagnosis not present

## 2018-06-23 DIAGNOSIS — D509 Iron deficiency anemia, unspecified: Secondary | ICD-10-CM | POA: Diagnosis not present

## 2018-06-23 DIAGNOSIS — D631 Anemia in chronic kidney disease: Secondary | ICD-10-CM | POA: Diagnosis not present

## 2018-06-25 DIAGNOSIS — D631 Anemia in chronic kidney disease: Secondary | ICD-10-CM | POA: Diagnosis not present

## 2018-06-25 DIAGNOSIS — N186 End stage renal disease: Secondary | ICD-10-CM | POA: Diagnosis not present

## 2018-06-25 DIAGNOSIS — Z23 Encounter for immunization: Secondary | ICD-10-CM | POA: Diagnosis not present

## 2018-06-25 DIAGNOSIS — D509 Iron deficiency anemia, unspecified: Secondary | ICD-10-CM | POA: Diagnosis not present

## 2018-06-25 DIAGNOSIS — Z992 Dependence on renal dialysis: Secondary | ICD-10-CM | POA: Diagnosis not present

## 2018-06-28 DIAGNOSIS — N186 End stage renal disease: Secondary | ICD-10-CM | POA: Diagnosis not present

## 2018-06-28 DIAGNOSIS — Z23 Encounter for immunization: Secondary | ICD-10-CM | POA: Diagnosis not present

## 2018-06-28 DIAGNOSIS — D509 Iron deficiency anemia, unspecified: Secondary | ICD-10-CM | POA: Diagnosis not present

## 2018-06-28 DIAGNOSIS — Z992 Dependence on renal dialysis: Secondary | ICD-10-CM | POA: Diagnosis not present

## 2018-06-28 DIAGNOSIS — D631 Anemia in chronic kidney disease: Secondary | ICD-10-CM | POA: Diagnosis not present

## 2018-06-30 DIAGNOSIS — Z23 Encounter for immunization: Secondary | ICD-10-CM | POA: Diagnosis not present

## 2018-06-30 DIAGNOSIS — D631 Anemia in chronic kidney disease: Secondary | ICD-10-CM | POA: Diagnosis not present

## 2018-06-30 DIAGNOSIS — N186 End stage renal disease: Secondary | ICD-10-CM | POA: Diagnosis not present

## 2018-06-30 DIAGNOSIS — Z992 Dependence on renal dialysis: Secondary | ICD-10-CM | POA: Diagnosis not present

## 2018-06-30 DIAGNOSIS — D509 Iron deficiency anemia, unspecified: Secondary | ICD-10-CM | POA: Diagnosis not present

## 2018-07-02 DIAGNOSIS — Z23 Encounter for immunization: Secondary | ICD-10-CM | POA: Diagnosis not present

## 2018-07-02 DIAGNOSIS — D631 Anemia in chronic kidney disease: Secondary | ICD-10-CM | POA: Diagnosis not present

## 2018-07-02 DIAGNOSIS — D509 Iron deficiency anemia, unspecified: Secondary | ICD-10-CM | POA: Diagnosis not present

## 2018-07-02 DIAGNOSIS — Z992 Dependence on renal dialysis: Secondary | ICD-10-CM | POA: Diagnosis not present

## 2018-07-02 DIAGNOSIS — N186 End stage renal disease: Secondary | ICD-10-CM | POA: Diagnosis not present

## 2018-07-05 DIAGNOSIS — D631 Anemia in chronic kidney disease: Secondary | ICD-10-CM | POA: Diagnosis not present

## 2018-07-05 DIAGNOSIS — Z23 Encounter for immunization: Secondary | ICD-10-CM | POA: Diagnosis not present

## 2018-07-05 DIAGNOSIS — N186 End stage renal disease: Secondary | ICD-10-CM | POA: Diagnosis not present

## 2018-07-05 DIAGNOSIS — D509 Iron deficiency anemia, unspecified: Secondary | ICD-10-CM | POA: Diagnosis not present

## 2018-07-05 DIAGNOSIS — Z992 Dependence on renal dialysis: Secondary | ICD-10-CM | POA: Diagnosis not present

## 2018-07-06 ENCOUNTER — Encounter: Payer: Medicare Other | Admitting: Vascular Surgery

## 2018-07-06 ENCOUNTER — Encounter: Payer: Self-pay | Admitting: Vascular Surgery

## 2018-07-06 ENCOUNTER — Ambulatory Visit (HOSPITAL_COMMUNITY): Admit: 2018-07-06 | Payer: Medicare Other

## 2018-07-07 DIAGNOSIS — D509 Iron deficiency anemia, unspecified: Secondary | ICD-10-CM | POA: Diagnosis not present

## 2018-07-07 DIAGNOSIS — Z23 Encounter for immunization: Secondary | ICD-10-CM | POA: Diagnosis not present

## 2018-07-07 DIAGNOSIS — N186 End stage renal disease: Secondary | ICD-10-CM | POA: Diagnosis not present

## 2018-07-07 DIAGNOSIS — D631 Anemia in chronic kidney disease: Secondary | ICD-10-CM | POA: Diagnosis not present

## 2018-07-07 DIAGNOSIS — Z992 Dependence on renal dialysis: Secondary | ICD-10-CM | POA: Diagnosis not present

## 2018-07-09 DIAGNOSIS — Z992 Dependence on renal dialysis: Secondary | ICD-10-CM | POA: Diagnosis not present

## 2018-07-09 DIAGNOSIS — Z23 Encounter for immunization: Secondary | ICD-10-CM | POA: Diagnosis not present

## 2018-07-09 DIAGNOSIS — D631 Anemia in chronic kidney disease: Secondary | ICD-10-CM | POA: Diagnosis not present

## 2018-07-09 DIAGNOSIS — N186 End stage renal disease: Secondary | ICD-10-CM | POA: Diagnosis not present

## 2018-07-09 DIAGNOSIS — D509 Iron deficiency anemia, unspecified: Secondary | ICD-10-CM | POA: Diagnosis not present

## 2018-07-12 DIAGNOSIS — Z992 Dependence on renal dialysis: Secondary | ICD-10-CM | POA: Diagnosis not present

## 2018-07-12 DIAGNOSIS — D509 Iron deficiency anemia, unspecified: Secondary | ICD-10-CM | POA: Diagnosis not present

## 2018-07-12 DIAGNOSIS — D631 Anemia in chronic kidney disease: Secondary | ICD-10-CM | POA: Diagnosis not present

## 2018-07-12 DIAGNOSIS — Z23 Encounter for immunization: Secondary | ICD-10-CM | POA: Diagnosis not present

## 2018-07-12 DIAGNOSIS — N186 End stage renal disease: Secondary | ICD-10-CM | POA: Diagnosis not present

## 2018-07-14 DIAGNOSIS — Z992 Dependence on renal dialysis: Secondary | ICD-10-CM | POA: Diagnosis not present

## 2018-07-14 DIAGNOSIS — D509 Iron deficiency anemia, unspecified: Secondary | ICD-10-CM | POA: Diagnosis not present

## 2018-07-14 DIAGNOSIS — N186 End stage renal disease: Secondary | ICD-10-CM | POA: Diagnosis not present

## 2018-07-14 DIAGNOSIS — D631 Anemia in chronic kidney disease: Secondary | ICD-10-CM | POA: Diagnosis not present

## 2018-07-14 DIAGNOSIS — Z23 Encounter for immunization: Secondary | ICD-10-CM | POA: Diagnosis not present

## 2018-07-16 DIAGNOSIS — D509 Iron deficiency anemia, unspecified: Secondary | ICD-10-CM | POA: Diagnosis not present

## 2018-07-16 DIAGNOSIS — D631 Anemia in chronic kidney disease: Secondary | ICD-10-CM | POA: Diagnosis not present

## 2018-07-16 DIAGNOSIS — N186 End stage renal disease: Secondary | ICD-10-CM | POA: Diagnosis not present

## 2018-07-16 DIAGNOSIS — Z23 Encounter for immunization: Secondary | ICD-10-CM | POA: Diagnosis not present

## 2018-07-16 DIAGNOSIS — Z992 Dependence on renal dialysis: Secondary | ICD-10-CM | POA: Diagnosis not present

## 2018-07-18 DIAGNOSIS — N186 End stage renal disease: Secondary | ICD-10-CM | POA: Diagnosis not present

## 2018-07-18 DIAGNOSIS — Z992 Dependence on renal dialysis: Secondary | ICD-10-CM | POA: Diagnosis not present

## 2018-07-19 DIAGNOSIS — Z992 Dependence on renal dialysis: Secondary | ICD-10-CM | POA: Diagnosis not present

## 2018-07-19 DIAGNOSIS — D509 Iron deficiency anemia, unspecified: Secondary | ICD-10-CM | POA: Diagnosis not present

## 2018-07-19 DIAGNOSIS — D631 Anemia in chronic kidney disease: Secondary | ICD-10-CM | POA: Diagnosis not present

## 2018-07-19 DIAGNOSIS — N186 End stage renal disease: Secondary | ICD-10-CM | POA: Diagnosis not present

## 2018-07-19 DIAGNOSIS — Z23 Encounter for immunization: Secondary | ICD-10-CM | POA: Diagnosis not present

## 2018-07-21 DIAGNOSIS — D631 Anemia in chronic kidney disease: Secondary | ICD-10-CM | POA: Diagnosis not present

## 2018-07-21 DIAGNOSIS — Z23 Encounter for immunization: Secondary | ICD-10-CM | POA: Diagnosis not present

## 2018-07-21 DIAGNOSIS — N186 End stage renal disease: Secondary | ICD-10-CM | POA: Diagnosis not present

## 2018-07-21 DIAGNOSIS — Z992 Dependence on renal dialysis: Secondary | ICD-10-CM | POA: Diagnosis not present

## 2018-07-21 DIAGNOSIS — D509 Iron deficiency anemia, unspecified: Secondary | ICD-10-CM | POA: Diagnosis not present

## 2018-07-23 DIAGNOSIS — D631 Anemia in chronic kidney disease: Secondary | ICD-10-CM | POA: Diagnosis not present

## 2018-07-23 DIAGNOSIS — D509 Iron deficiency anemia, unspecified: Secondary | ICD-10-CM | POA: Diagnosis not present

## 2018-07-23 DIAGNOSIS — Z992 Dependence on renal dialysis: Secondary | ICD-10-CM | POA: Diagnosis not present

## 2018-07-23 DIAGNOSIS — Z23 Encounter for immunization: Secondary | ICD-10-CM | POA: Diagnosis not present

## 2018-07-23 DIAGNOSIS — N186 End stage renal disease: Secondary | ICD-10-CM | POA: Diagnosis not present

## 2018-07-26 DIAGNOSIS — D631 Anemia in chronic kidney disease: Secondary | ICD-10-CM | POA: Diagnosis not present

## 2018-07-26 DIAGNOSIS — Z23 Encounter for immunization: Secondary | ICD-10-CM | POA: Diagnosis not present

## 2018-07-26 DIAGNOSIS — N186 End stage renal disease: Secondary | ICD-10-CM | POA: Diagnosis not present

## 2018-07-26 DIAGNOSIS — D509 Iron deficiency anemia, unspecified: Secondary | ICD-10-CM | POA: Diagnosis not present

## 2018-07-26 DIAGNOSIS — Z992 Dependence on renal dialysis: Secondary | ICD-10-CM | POA: Diagnosis not present

## 2018-07-28 DIAGNOSIS — Z992 Dependence on renal dialysis: Secondary | ICD-10-CM | POA: Diagnosis not present

## 2018-07-28 DIAGNOSIS — N186 End stage renal disease: Secondary | ICD-10-CM | POA: Diagnosis not present

## 2018-07-28 DIAGNOSIS — D509 Iron deficiency anemia, unspecified: Secondary | ICD-10-CM | POA: Diagnosis not present

## 2018-07-28 DIAGNOSIS — D631 Anemia in chronic kidney disease: Secondary | ICD-10-CM | POA: Diagnosis not present

## 2018-07-28 DIAGNOSIS — Z23 Encounter for immunization: Secondary | ICD-10-CM | POA: Diagnosis not present

## 2018-07-30 DIAGNOSIS — N186 End stage renal disease: Secondary | ICD-10-CM | POA: Diagnosis not present

## 2018-07-30 DIAGNOSIS — D509 Iron deficiency anemia, unspecified: Secondary | ICD-10-CM | POA: Diagnosis not present

## 2018-07-30 DIAGNOSIS — Z992 Dependence on renal dialysis: Secondary | ICD-10-CM | POA: Diagnosis not present

## 2018-07-30 DIAGNOSIS — D631 Anemia in chronic kidney disease: Secondary | ICD-10-CM | POA: Diagnosis not present

## 2018-07-30 DIAGNOSIS — Z23 Encounter for immunization: Secondary | ICD-10-CM | POA: Diagnosis not present

## 2018-08-02 DIAGNOSIS — Z992 Dependence on renal dialysis: Secondary | ICD-10-CM | POA: Diagnosis not present

## 2018-08-02 DIAGNOSIS — N186 End stage renal disease: Secondary | ICD-10-CM | POA: Diagnosis not present

## 2018-08-02 DIAGNOSIS — Z23 Encounter for immunization: Secondary | ICD-10-CM | POA: Diagnosis not present

## 2018-08-02 DIAGNOSIS — D509 Iron deficiency anemia, unspecified: Secondary | ICD-10-CM | POA: Diagnosis not present

## 2018-08-02 DIAGNOSIS — D631 Anemia in chronic kidney disease: Secondary | ICD-10-CM | POA: Diagnosis not present

## 2018-08-04 DIAGNOSIS — Z23 Encounter for immunization: Secondary | ICD-10-CM | POA: Diagnosis not present

## 2018-08-04 DIAGNOSIS — D631 Anemia in chronic kidney disease: Secondary | ICD-10-CM | POA: Diagnosis not present

## 2018-08-04 DIAGNOSIS — Z992 Dependence on renal dialysis: Secondary | ICD-10-CM | POA: Diagnosis not present

## 2018-08-04 DIAGNOSIS — D509 Iron deficiency anemia, unspecified: Secondary | ICD-10-CM | POA: Diagnosis not present

## 2018-08-04 DIAGNOSIS — N186 End stage renal disease: Secondary | ICD-10-CM | POA: Diagnosis not present

## 2018-08-07 DIAGNOSIS — Z992 Dependence on renal dialysis: Secondary | ICD-10-CM | POA: Diagnosis not present

## 2018-08-07 DIAGNOSIS — D509 Iron deficiency anemia, unspecified: Secondary | ICD-10-CM | POA: Diagnosis not present

## 2018-08-07 DIAGNOSIS — N186 End stage renal disease: Secondary | ICD-10-CM | POA: Diagnosis not present

## 2018-08-07 DIAGNOSIS — Z23 Encounter for immunization: Secondary | ICD-10-CM | POA: Diagnosis not present

## 2018-08-07 DIAGNOSIS — D631 Anemia in chronic kidney disease: Secondary | ICD-10-CM | POA: Diagnosis not present

## 2018-08-09 DIAGNOSIS — Z23 Encounter for immunization: Secondary | ICD-10-CM | POA: Diagnosis not present

## 2018-08-09 DIAGNOSIS — N186 End stage renal disease: Secondary | ICD-10-CM | POA: Diagnosis not present

## 2018-08-09 DIAGNOSIS — D509 Iron deficiency anemia, unspecified: Secondary | ICD-10-CM | POA: Diagnosis not present

## 2018-08-09 DIAGNOSIS — Z992 Dependence on renal dialysis: Secondary | ICD-10-CM | POA: Diagnosis not present

## 2018-08-09 DIAGNOSIS — D631 Anemia in chronic kidney disease: Secondary | ICD-10-CM | POA: Diagnosis not present

## 2018-08-11 DIAGNOSIS — D631 Anemia in chronic kidney disease: Secondary | ICD-10-CM | POA: Diagnosis not present

## 2018-08-11 DIAGNOSIS — D509 Iron deficiency anemia, unspecified: Secondary | ICD-10-CM | POA: Diagnosis not present

## 2018-08-11 DIAGNOSIS — Z23 Encounter for immunization: Secondary | ICD-10-CM | POA: Diagnosis not present

## 2018-08-11 DIAGNOSIS — Z992 Dependence on renal dialysis: Secondary | ICD-10-CM | POA: Diagnosis not present

## 2018-08-11 DIAGNOSIS — N186 End stage renal disease: Secondary | ICD-10-CM | POA: Diagnosis not present

## 2018-08-13 DIAGNOSIS — Z23 Encounter for immunization: Secondary | ICD-10-CM | POA: Diagnosis not present

## 2018-08-13 DIAGNOSIS — D509 Iron deficiency anemia, unspecified: Secondary | ICD-10-CM | POA: Diagnosis not present

## 2018-08-13 DIAGNOSIS — N186 End stage renal disease: Secondary | ICD-10-CM | POA: Diagnosis not present

## 2018-08-13 DIAGNOSIS — D631 Anemia in chronic kidney disease: Secondary | ICD-10-CM | POA: Diagnosis not present

## 2018-08-13 DIAGNOSIS — Z992 Dependence on renal dialysis: Secondary | ICD-10-CM | POA: Diagnosis not present

## 2018-08-16 DIAGNOSIS — Z992 Dependence on renal dialysis: Secondary | ICD-10-CM | POA: Diagnosis not present

## 2018-08-16 DIAGNOSIS — D509 Iron deficiency anemia, unspecified: Secondary | ICD-10-CM | POA: Diagnosis not present

## 2018-08-16 DIAGNOSIS — Z23 Encounter for immunization: Secondary | ICD-10-CM | POA: Diagnosis not present

## 2018-08-16 DIAGNOSIS — N186 End stage renal disease: Secondary | ICD-10-CM | POA: Diagnosis not present

## 2018-08-16 DIAGNOSIS — D631 Anemia in chronic kidney disease: Secondary | ICD-10-CM | POA: Diagnosis not present

## 2018-08-17 DIAGNOSIS — Z992 Dependence on renal dialysis: Secondary | ICD-10-CM | POA: Diagnosis not present

## 2018-08-17 DIAGNOSIS — N186 End stage renal disease: Secondary | ICD-10-CM | POA: Diagnosis not present

## 2018-08-18 DIAGNOSIS — D631 Anemia in chronic kidney disease: Secondary | ICD-10-CM | POA: Diagnosis not present

## 2018-08-18 DIAGNOSIS — D509 Iron deficiency anemia, unspecified: Secondary | ICD-10-CM | POA: Diagnosis not present

## 2018-08-18 DIAGNOSIS — Z23 Encounter for immunization: Secondary | ICD-10-CM | POA: Diagnosis not present

## 2018-08-18 DIAGNOSIS — Z992 Dependence on renal dialysis: Secondary | ICD-10-CM | POA: Diagnosis not present

## 2018-08-18 DIAGNOSIS — N186 End stage renal disease: Secondary | ICD-10-CM | POA: Diagnosis not present

## 2018-08-18 DIAGNOSIS — N2581 Secondary hyperparathyroidism of renal origin: Secondary | ICD-10-CM | POA: Diagnosis not present

## 2018-08-20 DIAGNOSIS — D509 Iron deficiency anemia, unspecified: Secondary | ICD-10-CM | POA: Diagnosis not present

## 2018-08-20 DIAGNOSIS — N186 End stage renal disease: Secondary | ICD-10-CM | POA: Diagnosis not present

## 2018-08-20 DIAGNOSIS — N2581 Secondary hyperparathyroidism of renal origin: Secondary | ICD-10-CM | POA: Diagnosis not present

## 2018-08-20 DIAGNOSIS — Z23 Encounter for immunization: Secondary | ICD-10-CM | POA: Diagnosis not present

## 2018-08-20 DIAGNOSIS — Z992 Dependence on renal dialysis: Secondary | ICD-10-CM | POA: Diagnosis not present

## 2018-08-20 DIAGNOSIS — D631 Anemia in chronic kidney disease: Secondary | ICD-10-CM | POA: Diagnosis not present

## 2018-08-23 DIAGNOSIS — Z6824 Body mass index (BMI) 24.0-24.9, adult: Secondary | ICD-10-CM | POA: Diagnosis not present

## 2018-08-23 DIAGNOSIS — Z992 Dependence on renal dialysis: Secondary | ICD-10-CM | POA: Diagnosis not present

## 2018-08-23 DIAGNOSIS — Z23 Encounter for immunization: Secondary | ICD-10-CM | POA: Diagnosis not present

## 2018-08-23 DIAGNOSIS — N186 End stage renal disease: Secondary | ICD-10-CM | POA: Diagnosis not present

## 2018-08-23 DIAGNOSIS — D631 Anemia in chronic kidney disease: Secondary | ICD-10-CM | POA: Diagnosis not present

## 2018-08-23 DIAGNOSIS — D509 Iron deficiency anemia, unspecified: Secondary | ICD-10-CM | POA: Diagnosis not present

## 2018-08-23 DIAGNOSIS — N2581 Secondary hyperparathyroidism of renal origin: Secondary | ICD-10-CM | POA: Diagnosis not present

## 2018-08-25 DIAGNOSIS — Z23 Encounter for immunization: Secondary | ICD-10-CM | POA: Diagnosis not present

## 2018-08-25 DIAGNOSIS — N2581 Secondary hyperparathyroidism of renal origin: Secondary | ICD-10-CM | POA: Diagnosis not present

## 2018-08-25 DIAGNOSIS — N186 End stage renal disease: Secondary | ICD-10-CM | POA: Diagnosis not present

## 2018-08-25 DIAGNOSIS — D509 Iron deficiency anemia, unspecified: Secondary | ICD-10-CM | POA: Diagnosis not present

## 2018-08-25 DIAGNOSIS — Z992 Dependence on renal dialysis: Secondary | ICD-10-CM | POA: Diagnosis not present

## 2018-08-25 DIAGNOSIS — D631 Anemia in chronic kidney disease: Secondary | ICD-10-CM | POA: Diagnosis not present

## 2018-08-27 DIAGNOSIS — Z992 Dependence on renal dialysis: Secondary | ICD-10-CM | POA: Diagnosis not present

## 2018-08-27 DIAGNOSIS — N2581 Secondary hyperparathyroidism of renal origin: Secondary | ICD-10-CM | POA: Diagnosis not present

## 2018-08-27 DIAGNOSIS — Z23 Encounter for immunization: Secondary | ICD-10-CM | POA: Diagnosis not present

## 2018-08-27 DIAGNOSIS — D509 Iron deficiency anemia, unspecified: Secondary | ICD-10-CM | POA: Diagnosis not present

## 2018-08-27 DIAGNOSIS — N186 End stage renal disease: Secondary | ICD-10-CM | POA: Diagnosis not present

## 2018-08-27 DIAGNOSIS — D631 Anemia in chronic kidney disease: Secondary | ICD-10-CM | POA: Diagnosis not present

## 2018-08-30 DIAGNOSIS — Z23 Encounter for immunization: Secondary | ICD-10-CM | POA: Diagnosis not present

## 2018-08-30 DIAGNOSIS — Z992 Dependence on renal dialysis: Secondary | ICD-10-CM | POA: Diagnosis not present

## 2018-08-30 DIAGNOSIS — N2581 Secondary hyperparathyroidism of renal origin: Secondary | ICD-10-CM | POA: Diagnosis not present

## 2018-08-30 DIAGNOSIS — N186 End stage renal disease: Secondary | ICD-10-CM | POA: Diagnosis not present

## 2018-08-30 DIAGNOSIS — D631 Anemia in chronic kidney disease: Secondary | ICD-10-CM | POA: Diagnosis not present

## 2018-08-30 DIAGNOSIS — D509 Iron deficiency anemia, unspecified: Secondary | ICD-10-CM | POA: Diagnosis not present

## 2018-09-01 DIAGNOSIS — D509 Iron deficiency anemia, unspecified: Secondary | ICD-10-CM | POA: Diagnosis not present

## 2018-09-01 DIAGNOSIS — Z992 Dependence on renal dialysis: Secondary | ICD-10-CM | POA: Diagnosis not present

## 2018-09-01 DIAGNOSIS — Z23 Encounter for immunization: Secondary | ICD-10-CM | POA: Diagnosis not present

## 2018-09-01 DIAGNOSIS — D631 Anemia in chronic kidney disease: Secondary | ICD-10-CM | POA: Diagnosis not present

## 2018-09-01 DIAGNOSIS — N186 End stage renal disease: Secondary | ICD-10-CM | POA: Diagnosis not present

## 2018-09-01 DIAGNOSIS — N2581 Secondary hyperparathyroidism of renal origin: Secondary | ICD-10-CM | POA: Diagnosis not present

## 2018-09-03 DIAGNOSIS — Z992 Dependence on renal dialysis: Secondary | ICD-10-CM | POA: Diagnosis not present

## 2018-09-03 DIAGNOSIS — D631 Anemia in chronic kidney disease: Secondary | ICD-10-CM | POA: Diagnosis not present

## 2018-09-03 DIAGNOSIS — Z23 Encounter for immunization: Secondary | ICD-10-CM | POA: Diagnosis not present

## 2018-09-03 DIAGNOSIS — N2581 Secondary hyperparathyroidism of renal origin: Secondary | ICD-10-CM | POA: Diagnosis not present

## 2018-09-03 DIAGNOSIS — D509 Iron deficiency anemia, unspecified: Secondary | ICD-10-CM | POA: Diagnosis not present

## 2018-09-03 DIAGNOSIS — N186 End stage renal disease: Secondary | ICD-10-CM | POA: Diagnosis not present

## 2018-09-06 DIAGNOSIS — N2581 Secondary hyperparathyroidism of renal origin: Secondary | ICD-10-CM | POA: Diagnosis not present

## 2018-09-06 DIAGNOSIS — D631 Anemia in chronic kidney disease: Secondary | ICD-10-CM | POA: Diagnosis not present

## 2018-09-06 DIAGNOSIS — Z23 Encounter for immunization: Secondary | ICD-10-CM | POA: Diagnosis not present

## 2018-09-06 DIAGNOSIS — N186 End stage renal disease: Secondary | ICD-10-CM | POA: Diagnosis not present

## 2018-09-06 DIAGNOSIS — Z992 Dependence on renal dialysis: Secondary | ICD-10-CM | POA: Diagnosis not present

## 2018-09-06 DIAGNOSIS — D509 Iron deficiency anemia, unspecified: Secondary | ICD-10-CM | POA: Diagnosis not present

## 2018-09-08 DIAGNOSIS — Z992 Dependence on renal dialysis: Secondary | ICD-10-CM | POA: Diagnosis not present

## 2018-09-08 DIAGNOSIS — D509 Iron deficiency anemia, unspecified: Secondary | ICD-10-CM | POA: Diagnosis not present

## 2018-09-08 DIAGNOSIS — D631 Anemia in chronic kidney disease: Secondary | ICD-10-CM | POA: Diagnosis not present

## 2018-09-08 DIAGNOSIS — N186 End stage renal disease: Secondary | ICD-10-CM | POA: Diagnosis not present

## 2018-09-08 DIAGNOSIS — N2581 Secondary hyperparathyroidism of renal origin: Secondary | ICD-10-CM | POA: Diagnosis not present

## 2018-09-08 DIAGNOSIS — Z23 Encounter for immunization: Secondary | ICD-10-CM | POA: Diagnosis not present

## 2018-09-09 DIAGNOSIS — I160 Hypertensive urgency: Secondary | ICD-10-CM | POA: Diagnosis not present

## 2018-09-09 DIAGNOSIS — Z09 Encounter for follow-up examination after completed treatment for conditions other than malignant neoplasm: Secondary | ICD-10-CM | POA: Diagnosis not present

## 2018-09-09 DIAGNOSIS — D631 Anemia in chronic kidney disease: Secondary | ICD-10-CM | POA: Diagnosis not present

## 2018-09-09 DIAGNOSIS — Z992 Dependence on renal dialysis: Secondary | ICD-10-CM | POA: Diagnosis not present

## 2018-09-09 DIAGNOSIS — R61 Generalized hyperhidrosis: Secondary | ICD-10-CM | POA: Diagnosis not present

## 2018-09-09 DIAGNOSIS — K4090 Unilateral inguinal hernia, without obstruction or gangrene, not specified as recurrent: Secondary | ICD-10-CM | POA: Diagnosis not present

## 2018-09-09 DIAGNOSIS — E162 Hypoglycemia, unspecified: Secondary | ICD-10-CM | POA: Diagnosis not present

## 2018-09-09 DIAGNOSIS — Z87891 Personal history of nicotine dependence: Secondary | ICD-10-CM | POA: Diagnosis not present

## 2018-09-09 DIAGNOSIS — Z9119 Patient's noncompliance with other medical treatment and regimen: Secondary | ICD-10-CM | POA: Diagnosis not present

## 2018-09-09 DIAGNOSIS — R109 Unspecified abdominal pain: Secondary | ICD-10-CM | POA: Diagnosis not present

## 2018-09-09 DIAGNOSIS — R079 Chest pain, unspecified: Secondary | ICD-10-CM | POA: Diagnosis not present

## 2018-09-09 DIAGNOSIS — I120 Hypertensive chronic kidney disease with stage 5 chronic kidney disease or end stage renal disease: Secondary | ICD-10-CM | POA: Diagnosis not present

## 2018-09-09 DIAGNOSIS — I10 Essential (primary) hypertension: Secondary | ICD-10-CM | POA: Diagnosis not present

## 2018-09-09 DIAGNOSIS — N186 End stage renal disease: Secondary | ICD-10-CM | POA: Diagnosis not present

## 2018-09-10 DIAGNOSIS — Z992 Dependence on renal dialysis: Secondary | ICD-10-CM | POA: Diagnosis not present

## 2018-09-10 DIAGNOSIS — E162 Hypoglycemia, unspecified: Secondary | ICD-10-CM | POA: Diagnosis not present

## 2018-09-10 DIAGNOSIS — N186 End stage renal disease: Secondary | ICD-10-CM | POA: Diagnosis not present

## 2018-09-10 DIAGNOSIS — I10 Essential (primary) hypertension: Secondary | ICD-10-CM | POA: Diagnosis not present

## 2018-09-10 DIAGNOSIS — R079 Chest pain, unspecified: Secondary | ICD-10-CM | POA: Diagnosis not present

## 2018-09-10 DIAGNOSIS — Z7982 Long term (current) use of aspirin: Secondary | ICD-10-CM | POA: Diagnosis not present

## 2018-09-10 DIAGNOSIS — I160 Hypertensive urgency: Secondary | ICD-10-CM | POA: Diagnosis not present

## 2018-09-10 DIAGNOSIS — Z87891 Personal history of nicotine dependence: Secondary | ICD-10-CM | POA: Diagnosis not present

## 2018-09-10 DIAGNOSIS — I120 Hypertensive chronic kidney disease with stage 5 chronic kidney disease or end stage renal disease: Secondary | ICD-10-CM | POA: Diagnosis not present

## 2018-09-10 DIAGNOSIS — D631 Anemia in chronic kidney disease: Secondary | ICD-10-CM | POA: Diagnosis not present

## 2018-09-10 DIAGNOSIS — R61 Generalized hyperhidrosis: Secondary | ICD-10-CM | POA: Diagnosis present

## 2018-09-10 DIAGNOSIS — R109 Unspecified abdominal pain: Secondary | ICD-10-CM | POA: Diagnosis not present

## 2018-09-10 DIAGNOSIS — Z9119 Patient's noncompliance with other medical treatment and regimen: Secondary | ICD-10-CM | POA: Diagnosis not present

## 2018-09-10 DIAGNOSIS — N189 Chronic kidney disease, unspecified: Secondary | ICD-10-CM | POA: Diagnosis not present

## 2018-09-10 DIAGNOSIS — K4090 Unilateral inguinal hernia, without obstruction or gangrene, not specified as recurrent: Secondary | ICD-10-CM | POA: Diagnosis present

## 2018-09-14 DIAGNOSIS — Z992 Dependence on renal dialysis: Secondary | ICD-10-CM | POA: Diagnosis not present

## 2018-09-14 DIAGNOSIS — D509 Iron deficiency anemia, unspecified: Secondary | ICD-10-CM | POA: Diagnosis not present

## 2018-09-14 DIAGNOSIS — Z23 Encounter for immunization: Secondary | ICD-10-CM | POA: Diagnosis not present

## 2018-09-14 DIAGNOSIS — N2581 Secondary hyperparathyroidism of renal origin: Secondary | ICD-10-CM | POA: Diagnosis not present

## 2018-09-14 DIAGNOSIS — N186 End stage renal disease: Secondary | ICD-10-CM | POA: Diagnosis not present

## 2018-09-14 DIAGNOSIS — D631 Anemia in chronic kidney disease: Secondary | ICD-10-CM | POA: Diagnosis not present

## 2018-09-15 DIAGNOSIS — D509 Iron deficiency anemia, unspecified: Secondary | ICD-10-CM | POA: Diagnosis not present

## 2018-09-15 DIAGNOSIS — Z992 Dependence on renal dialysis: Secondary | ICD-10-CM | POA: Diagnosis not present

## 2018-09-15 DIAGNOSIS — N2581 Secondary hyperparathyroidism of renal origin: Secondary | ICD-10-CM | POA: Diagnosis not present

## 2018-09-15 DIAGNOSIS — D631 Anemia in chronic kidney disease: Secondary | ICD-10-CM | POA: Diagnosis not present

## 2018-09-15 DIAGNOSIS — N186 End stage renal disease: Secondary | ICD-10-CM | POA: Diagnosis not present

## 2018-09-15 DIAGNOSIS — Z23 Encounter for immunization: Secondary | ICD-10-CM | POA: Diagnosis not present

## 2018-09-17 DIAGNOSIS — Z992 Dependence on renal dialysis: Secondary | ICD-10-CM | POA: Diagnosis not present

## 2018-09-17 DIAGNOSIS — D631 Anemia in chronic kidney disease: Secondary | ICD-10-CM | POA: Diagnosis not present

## 2018-09-17 DIAGNOSIS — D509 Iron deficiency anemia, unspecified: Secondary | ICD-10-CM | POA: Diagnosis not present

## 2018-09-17 DIAGNOSIS — N186 End stage renal disease: Secondary | ICD-10-CM | POA: Diagnosis not present

## 2018-09-20 DIAGNOSIS — D631 Anemia in chronic kidney disease: Secondary | ICD-10-CM | POA: Diagnosis not present

## 2018-09-20 DIAGNOSIS — D509 Iron deficiency anemia, unspecified: Secondary | ICD-10-CM | POA: Diagnosis not present

## 2018-09-20 DIAGNOSIS — N186 End stage renal disease: Secondary | ICD-10-CM | POA: Diagnosis not present

## 2018-09-20 DIAGNOSIS — Z992 Dependence on renal dialysis: Secondary | ICD-10-CM | POA: Diagnosis not present

## 2018-09-23 DIAGNOSIS — N186 End stage renal disease: Secondary | ICD-10-CM | POA: Diagnosis not present

## 2018-09-23 DIAGNOSIS — D509 Iron deficiency anemia, unspecified: Secondary | ICD-10-CM | POA: Diagnosis not present

## 2018-09-23 DIAGNOSIS — Z992 Dependence on renal dialysis: Secondary | ICD-10-CM | POA: Diagnosis not present

## 2018-09-23 DIAGNOSIS — D631 Anemia in chronic kidney disease: Secondary | ICD-10-CM | POA: Diagnosis not present

## 2018-09-24 DIAGNOSIS — N186 End stage renal disease: Secondary | ICD-10-CM | POA: Diagnosis not present

## 2018-09-24 DIAGNOSIS — D509 Iron deficiency anemia, unspecified: Secondary | ICD-10-CM | POA: Diagnosis not present

## 2018-09-24 DIAGNOSIS — D631 Anemia in chronic kidney disease: Secondary | ICD-10-CM | POA: Diagnosis not present

## 2018-09-24 DIAGNOSIS — Z992 Dependence on renal dialysis: Secondary | ICD-10-CM | POA: Diagnosis not present

## 2018-09-27 DIAGNOSIS — D631 Anemia in chronic kidney disease: Secondary | ICD-10-CM | POA: Diagnosis not present

## 2018-09-27 DIAGNOSIS — D509 Iron deficiency anemia, unspecified: Secondary | ICD-10-CM | POA: Diagnosis not present

## 2018-09-27 DIAGNOSIS — N186 End stage renal disease: Secondary | ICD-10-CM | POA: Diagnosis not present

## 2018-09-27 DIAGNOSIS — Z992 Dependence on renal dialysis: Secondary | ICD-10-CM | POA: Diagnosis not present

## 2018-09-29 DIAGNOSIS — D631 Anemia in chronic kidney disease: Secondary | ICD-10-CM | POA: Diagnosis not present

## 2018-09-29 DIAGNOSIS — Z992 Dependence on renal dialysis: Secondary | ICD-10-CM | POA: Diagnosis not present

## 2018-09-29 DIAGNOSIS — K4090 Unilateral inguinal hernia, without obstruction or gangrene, not specified as recurrent: Secondary | ICD-10-CM | POA: Diagnosis not present

## 2018-09-29 DIAGNOSIS — N186 End stage renal disease: Secondary | ICD-10-CM | POA: Diagnosis not present

## 2018-09-29 DIAGNOSIS — D509 Iron deficiency anemia, unspecified: Secondary | ICD-10-CM | POA: Diagnosis not present

## 2018-10-01 DIAGNOSIS — D509 Iron deficiency anemia, unspecified: Secondary | ICD-10-CM | POA: Diagnosis not present

## 2018-10-01 DIAGNOSIS — Z992 Dependence on renal dialysis: Secondary | ICD-10-CM | POA: Diagnosis not present

## 2018-10-01 DIAGNOSIS — N186 End stage renal disease: Secondary | ICD-10-CM | POA: Diagnosis not present

## 2018-10-01 DIAGNOSIS — D631 Anemia in chronic kidney disease: Secondary | ICD-10-CM | POA: Diagnosis not present

## 2018-10-04 DIAGNOSIS — Z992 Dependence on renal dialysis: Secondary | ICD-10-CM | POA: Diagnosis not present

## 2018-10-04 DIAGNOSIS — D509 Iron deficiency anemia, unspecified: Secondary | ICD-10-CM | POA: Diagnosis not present

## 2018-10-04 DIAGNOSIS — N186 End stage renal disease: Secondary | ICD-10-CM | POA: Diagnosis not present

## 2018-10-04 DIAGNOSIS — D631 Anemia in chronic kidney disease: Secondary | ICD-10-CM | POA: Diagnosis not present

## 2018-10-06 DIAGNOSIS — D631 Anemia in chronic kidney disease: Secondary | ICD-10-CM | POA: Diagnosis not present

## 2018-10-06 DIAGNOSIS — Z992 Dependence on renal dialysis: Secondary | ICD-10-CM | POA: Diagnosis not present

## 2018-10-06 DIAGNOSIS — N186 End stage renal disease: Secondary | ICD-10-CM | POA: Diagnosis not present

## 2018-10-06 DIAGNOSIS — D509 Iron deficiency anemia, unspecified: Secondary | ICD-10-CM | POA: Diagnosis not present

## 2018-10-07 DIAGNOSIS — N186 End stage renal disease: Secondary | ICD-10-CM | POA: Diagnosis not present

## 2018-10-07 DIAGNOSIS — Z992 Dependence on renal dialysis: Secondary | ICD-10-CM | POA: Diagnosis not present

## 2018-10-07 DIAGNOSIS — K4090 Unilateral inguinal hernia, without obstruction or gangrene, not specified as recurrent: Secondary | ICD-10-CM | POA: Diagnosis not present

## 2018-10-07 DIAGNOSIS — I120 Hypertensive chronic kidney disease with stage 5 chronic kidney disease or end stage renal disease: Secondary | ICD-10-CM | POA: Diagnosis not present

## 2018-10-10 DIAGNOSIS — Z992 Dependence on renal dialysis: Secondary | ICD-10-CM | POA: Diagnosis not present

## 2018-10-10 DIAGNOSIS — D509 Iron deficiency anemia, unspecified: Secondary | ICD-10-CM | POA: Diagnosis not present

## 2018-10-10 DIAGNOSIS — D631 Anemia in chronic kidney disease: Secondary | ICD-10-CM | POA: Diagnosis not present

## 2018-10-10 DIAGNOSIS — N186 End stage renal disease: Secondary | ICD-10-CM | POA: Diagnosis not present

## 2018-10-12 DIAGNOSIS — D509 Iron deficiency anemia, unspecified: Secondary | ICD-10-CM | POA: Diagnosis not present

## 2018-10-12 DIAGNOSIS — Z992 Dependence on renal dialysis: Secondary | ICD-10-CM | POA: Diagnosis not present

## 2018-10-12 DIAGNOSIS — D631 Anemia in chronic kidney disease: Secondary | ICD-10-CM | POA: Diagnosis not present

## 2018-10-12 DIAGNOSIS — N186 End stage renal disease: Secondary | ICD-10-CM | POA: Diagnosis not present

## 2018-10-15 DIAGNOSIS — N186 End stage renal disease: Secondary | ICD-10-CM | POA: Diagnosis not present

## 2018-10-15 DIAGNOSIS — D509 Iron deficiency anemia, unspecified: Secondary | ICD-10-CM | POA: Diagnosis not present

## 2018-10-15 DIAGNOSIS — D631 Anemia in chronic kidney disease: Secondary | ICD-10-CM | POA: Diagnosis not present

## 2018-10-15 DIAGNOSIS — Z992 Dependence on renal dialysis: Secondary | ICD-10-CM | POA: Diagnosis not present

## 2018-10-17 DIAGNOSIS — Z992 Dependence on renal dialysis: Secondary | ICD-10-CM | POA: Diagnosis not present

## 2018-10-17 DIAGNOSIS — N186 End stage renal disease: Secondary | ICD-10-CM | POA: Diagnosis not present

## 2018-10-19 DIAGNOSIS — N186 End stage renal disease: Secondary | ICD-10-CM | POA: Diagnosis not present

## 2018-10-19 DIAGNOSIS — D631 Anemia in chronic kidney disease: Secondary | ICD-10-CM | POA: Diagnosis not present

## 2018-10-19 DIAGNOSIS — Z992 Dependence on renal dialysis: Secondary | ICD-10-CM | POA: Diagnosis not present

## 2018-10-19 DIAGNOSIS — D509 Iron deficiency anemia, unspecified: Secondary | ICD-10-CM | POA: Diagnosis not present

## 2018-10-20 DIAGNOSIS — D631 Anemia in chronic kidney disease: Secondary | ICD-10-CM | POA: Diagnosis not present

## 2018-10-20 DIAGNOSIS — N186 End stage renal disease: Secondary | ICD-10-CM | POA: Diagnosis not present

## 2018-10-20 DIAGNOSIS — D509 Iron deficiency anemia, unspecified: Secondary | ICD-10-CM | POA: Diagnosis not present

## 2018-10-20 DIAGNOSIS — Z992 Dependence on renal dialysis: Secondary | ICD-10-CM | POA: Diagnosis not present

## 2018-10-22 DIAGNOSIS — Z992 Dependence on renal dialysis: Secondary | ICD-10-CM | POA: Diagnosis not present

## 2018-10-22 DIAGNOSIS — N186 End stage renal disease: Secondary | ICD-10-CM | POA: Diagnosis not present

## 2018-10-22 DIAGNOSIS — D509 Iron deficiency anemia, unspecified: Secondary | ICD-10-CM | POA: Diagnosis not present

## 2018-10-22 DIAGNOSIS — D631 Anemia in chronic kidney disease: Secondary | ICD-10-CM | POA: Diagnosis not present

## 2018-10-25 DIAGNOSIS — M7032 Other bursitis of elbow, left elbow: Secondary | ICD-10-CM | POA: Diagnosis not present

## 2018-10-25 DIAGNOSIS — N186 End stage renal disease: Secondary | ICD-10-CM | POA: Diagnosis not present

## 2018-10-25 DIAGNOSIS — Z992 Dependence on renal dialysis: Secondary | ICD-10-CM | POA: Diagnosis not present

## 2018-10-25 DIAGNOSIS — D509 Iron deficiency anemia, unspecified: Secondary | ICD-10-CM | POA: Diagnosis not present

## 2018-10-25 DIAGNOSIS — D631 Anemia in chronic kidney disease: Secondary | ICD-10-CM | POA: Diagnosis not present

## 2018-10-27 DIAGNOSIS — N186 End stage renal disease: Secondary | ICD-10-CM | POA: Diagnosis not present

## 2018-10-27 DIAGNOSIS — D631 Anemia in chronic kidney disease: Secondary | ICD-10-CM | POA: Diagnosis not present

## 2018-10-27 DIAGNOSIS — D509 Iron deficiency anemia, unspecified: Secondary | ICD-10-CM | POA: Diagnosis not present

## 2018-10-27 DIAGNOSIS — Z992 Dependence on renal dialysis: Secondary | ICD-10-CM | POA: Diagnosis not present

## 2018-10-29 DIAGNOSIS — Z992 Dependence on renal dialysis: Secondary | ICD-10-CM | POA: Diagnosis not present

## 2018-10-29 DIAGNOSIS — N186 End stage renal disease: Secondary | ICD-10-CM | POA: Diagnosis not present

## 2018-10-29 DIAGNOSIS — D509 Iron deficiency anemia, unspecified: Secondary | ICD-10-CM | POA: Diagnosis not present

## 2018-10-29 DIAGNOSIS — D631 Anemia in chronic kidney disease: Secondary | ICD-10-CM | POA: Diagnosis not present

## 2018-11-01 DIAGNOSIS — Z992 Dependence on renal dialysis: Secondary | ICD-10-CM | POA: Diagnosis not present

## 2018-11-01 DIAGNOSIS — D509 Iron deficiency anemia, unspecified: Secondary | ICD-10-CM | POA: Diagnosis not present

## 2018-11-01 DIAGNOSIS — D631 Anemia in chronic kidney disease: Secondary | ICD-10-CM | POA: Diagnosis not present

## 2018-11-01 DIAGNOSIS — N186 End stage renal disease: Secondary | ICD-10-CM | POA: Diagnosis not present

## 2018-11-03 DIAGNOSIS — D631 Anemia in chronic kidney disease: Secondary | ICD-10-CM | POA: Diagnosis not present

## 2018-11-03 DIAGNOSIS — D509 Iron deficiency anemia, unspecified: Secondary | ICD-10-CM | POA: Diagnosis not present

## 2018-11-03 DIAGNOSIS — Z992 Dependence on renal dialysis: Secondary | ICD-10-CM | POA: Diagnosis not present

## 2018-11-03 DIAGNOSIS — N186 End stage renal disease: Secondary | ICD-10-CM | POA: Diagnosis not present

## 2018-11-05 DIAGNOSIS — D509 Iron deficiency anemia, unspecified: Secondary | ICD-10-CM | POA: Diagnosis not present

## 2018-11-05 DIAGNOSIS — D631 Anemia in chronic kidney disease: Secondary | ICD-10-CM | POA: Diagnosis not present

## 2018-11-05 DIAGNOSIS — Z992 Dependence on renal dialysis: Secondary | ICD-10-CM | POA: Diagnosis not present

## 2018-11-05 DIAGNOSIS — N186 End stage renal disease: Secondary | ICD-10-CM | POA: Diagnosis not present

## 2018-11-07 DIAGNOSIS — D509 Iron deficiency anemia, unspecified: Secondary | ICD-10-CM | POA: Diagnosis not present

## 2018-11-07 DIAGNOSIS — D631 Anemia in chronic kidney disease: Secondary | ICD-10-CM | POA: Diagnosis not present

## 2018-11-07 DIAGNOSIS — N186 End stage renal disease: Secondary | ICD-10-CM | POA: Diagnosis not present

## 2018-11-07 DIAGNOSIS — Z992 Dependence on renal dialysis: Secondary | ICD-10-CM | POA: Diagnosis not present

## 2018-11-09 DIAGNOSIS — Z992 Dependence on renal dialysis: Secondary | ICD-10-CM | POA: Diagnosis not present

## 2018-11-09 DIAGNOSIS — L0501 Pilonidal cyst with abscess: Secondary | ICD-10-CM | POA: Diagnosis not present

## 2018-11-09 DIAGNOSIS — D509 Iron deficiency anemia, unspecified: Secondary | ICD-10-CM | POA: Diagnosis not present

## 2018-11-09 DIAGNOSIS — N186 End stage renal disease: Secondary | ICD-10-CM | POA: Diagnosis not present

## 2018-11-09 DIAGNOSIS — D631 Anemia in chronic kidney disease: Secondary | ICD-10-CM | POA: Diagnosis not present

## 2018-11-11 DIAGNOSIS — N186 End stage renal disease: Secondary | ICD-10-CM | POA: Diagnosis not present

## 2018-11-11 DIAGNOSIS — L0231 Cutaneous abscess of buttock: Secondary | ICD-10-CM | POA: Diagnosis not present

## 2018-11-12 DIAGNOSIS — D631 Anemia in chronic kidney disease: Secondary | ICD-10-CM | POA: Diagnosis not present

## 2018-11-12 DIAGNOSIS — N186 End stage renal disease: Secondary | ICD-10-CM | POA: Diagnosis not present

## 2018-11-12 DIAGNOSIS — Z992 Dependence on renal dialysis: Secondary | ICD-10-CM | POA: Diagnosis not present

## 2018-11-12 DIAGNOSIS — D509 Iron deficiency anemia, unspecified: Secondary | ICD-10-CM | POA: Diagnosis not present

## 2018-11-15 DIAGNOSIS — D509 Iron deficiency anemia, unspecified: Secondary | ICD-10-CM | POA: Diagnosis not present

## 2018-11-15 DIAGNOSIS — Z992 Dependence on renal dialysis: Secondary | ICD-10-CM | POA: Diagnosis not present

## 2018-11-15 DIAGNOSIS — N186 End stage renal disease: Secondary | ICD-10-CM | POA: Diagnosis not present

## 2018-11-15 DIAGNOSIS — D631 Anemia in chronic kidney disease: Secondary | ICD-10-CM | POA: Diagnosis not present

## 2018-11-17 DIAGNOSIS — Z992 Dependence on renal dialysis: Secondary | ICD-10-CM | POA: Diagnosis not present

## 2018-11-17 DIAGNOSIS — D509 Iron deficiency anemia, unspecified: Secondary | ICD-10-CM | POA: Diagnosis not present

## 2018-11-17 DIAGNOSIS — N2581 Secondary hyperparathyroidism of renal origin: Secondary | ICD-10-CM | POA: Diagnosis not present

## 2018-11-17 DIAGNOSIS — D631 Anemia in chronic kidney disease: Secondary | ICD-10-CM | POA: Diagnosis not present

## 2018-11-17 DIAGNOSIS — N186 End stage renal disease: Secondary | ICD-10-CM | POA: Diagnosis not present

## 2018-11-19 DIAGNOSIS — N2581 Secondary hyperparathyroidism of renal origin: Secondary | ICD-10-CM | POA: Diagnosis not present

## 2018-11-19 DIAGNOSIS — D509 Iron deficiency anemia, unspecified: Secondary | ICD-10-CM | POA: Diagnosis not present

## 2018-11-19 DIAGNOSIS — D631 Anemia in chronic kidney disease: Secondary | ICD-10-CM | POA: Diagnosis not present

## 2018-11-19 DIAGNOSIS — Z992 Dependence on renal dialysis: Secondary | ICD-10-CM | POA: Diagnosis not present

## 2018-11-19 DIAGNOSIS — N186 End stage renal disease: Secondary | ICD-10-CM | POA: Diagnosis not present

## 2018-11-22 DIAGNOSIS — N2581 Secondary hyperparathyroidism of renal origin: Secondary | ICD-10-CM | POA: Diagnosis not present

## 2018-11-22 DIAGNOSIS — D509 Iron deficiency anemia, unspecified: Secondary | ICD-10-CM | POA: Diagnosis not present

## 2018-11-22 DIAGNOSIS — D631 Anemia in chronic kidney disease: Secondary | ICD-10-CM | POA: Diagnosis not present

## 2018-11-22 DIAGNOSIS — Z992 Dependence on renal dialysis: Secondary | ICD-10-CM | POA: Diagnosis not present

## 2018-11-22 DIAGNOSIS — N186 End stage renal disease: Secondary | ICD-10-CM | POA: Diagnosis not present

## 2018-11-24 DIAGNOSIS — M7032 Other bursitis of elbow, left elbow: Secondary | ICD-10-CM | POA: Diagnosis not present

## 2018-11-24 DIAGNOSIS — D631 Anemia in chronic kidney disease: Secondary | ICD-10-CM | POA: Diagnosis not present

## 2018-11-24 DIAGNOSIS — L989 Disorder of the skin and subcutaneous tissue, unspecified: Secondary | ICD-10-CM | POA: Diagnosis not present

## 2018-11-24 DIAGNOSIS — N186 End stage renal disease: Secondary | ICD-10-CM | POA: Diagnosis not present

## 2018-11-24 DIAGNOSIS — Z992 Dependence on renal dialysis: Secondary | ICD-10-CM | POA: Diagnosis not present

## 2018-11-24 DIAGNOSIS — N2581 Secondary hyperparathyroidism of renal origin: Secondary | ICD-10-CM | POA: Diagnosis not present

## 2018-11-24 DIAGNOSIS — D509 Iron deficiency anemia, unspecified: Secondary | ICD-10-CM | POA: Diagnosis not present

## 2018-11-24 DIAGNOSIS — I770 Arteriovenous fistula, acquired: Secondary | ICD-10-CM | POA: Diagnosis not present

## 2018-11-26 DIAGNOSIS — D509 Iron deficiency anemia, unspecified: Secondary | ICD-10-CM | POA: Diagnosis not present

## 2018-11-26 DIAGNOSIS — D631 Anemia in chronic kidney disease: Secondary | ICD-10-CM | POA: Diagnosis not present

## 2018-11-26 DIAGNOSIS — N186 End stage renal disease: Secondary | ICD-10-CM | POA: Diagnosis not present

## 2018-11-26 DIAGNOSIS — N2581 Secondary hyperparathyroidism of renal origin: Secondary | ICD-10-CM | POA: Diagnosis not present

## 2018-11-26 DIAGNOSIS — Z992 Dependence on renal dialysis: Secondary | ICD-10-CM | POA: Diagnosis not present

## 2018-11-29 DIAGNOSIS — T82898A Other specified complication of vascular prosthetic devices, implants and grafts, initial encounter: Secondary | ICD-10-CM | POA: Diagnosis not present

## 2018-11-29 DIAGNOSIS — Z992 Dependence on renal dialysis: Secondary | ICD-10-CM | POA: Diagnosis not present

## 2018-11-29 DIAGNOSIS — I252 Old myocardial infarction: Secondary | ICD-10-CM | POA: Diagnosis not present

## 2018-11-29 DIAGNOSIS — T82510D Breakdown (mechanical) of surgically created arteriovenous fistula, subsequent encounter: Secondary | ICD-10-CM | POA: Diagnosis not present

## 2018-11-29 DIAGNOSIS — I120 Hypertensive chronic kidney disease with stage 5 chronic kidney disease or end stage renal disease: Secondary | ICD-10-CM | POA: Diagnosis not present

## 2018-11-29 DIAGNOSIS — L989 Disorder of the skin and subcutaneous tissue, unspecified: Secondary | ICD-10-CM | POA: Diagnosis not present

## 2018-11-29 DIAGNOSIS — N186 End stage renal disease: Secondary | ICD-10-CM | POA: Diagnosis not present

## 2018-11-29 DIAGNOSIS — T82858A Stenosis of vascular prosthetic devices, implants and grafts, initial encounter: Secondary | ICD-10-CM | POA: Diagnosis not present

## 2018-11-30 DIAGNOSIS — N2581 Secondary hyperparathyroidism of renal origin: Secondary | ICD-10-CM | POA: Diagnosis not present

## 2018-11-30 DIAGNOSIS — D631 Anemia in chronic kidney disease: Secondary | ICD-10-CM | POA: Diagnosis not present

## 2018-11-30 DIAGNOSIS — N186 End stage renal disease: Secondary | ICD-10-CM | POA: Diagnosis not present

## 2018-11-30 DIAGNOSIS — R253 Fasciculation: Secondary | ICD-10-CM | POA: Diagnosis not present

## 2018-11-30 DIAGNOSIS — M25822 Other specified joint disorders, left elbow: Secondary | ICD-10-CM | POA: Diagnosis not present

## 2018-11-30 DIAGNOSIS — D509 Iron deficiency anemia, unspecified: Secondary | ICD-10-CM | POA: Diagnosis not present

## 2018-11-30 DIAGNOSIS — Z992 Dependence on renal dialysis: Secondary | ICD-10-CM | POA: Diagnosis not present

## 2018-12-01 DIAGNOSIS — N2581 Secondary hyperparathyroidism of renal origin: Secondary | ICD-10-CM | POA: Diagnosis not present

## 2018-12-01 DIAGNOSIS — D631 Anemia in chronic kidney disease: Secondary | ICD-10-CM | POA: Diagnosis not present

## 2018-12-01 DIAGNOSIS — D509 Iron deficiency anemia, unspecified: Secondary | ICD-10-CM | POA: Diagnosis not present

## 2018-12-01 DIAGNOSIS — Z992 Dependence on renal dialysis: Secondary | ICD-10-CM | POA: Diagnosis not present

## 2018-12-01 DIAGNOSIS — N186 End stage renal disease: Secondary | ICD-10-CM | POA: Diagnosis not present

## 2018-12-03 DIAGNOSIS — N2581 Secondary hyperparathyroidism of renal origin: Secondary | ICD-10-CM | POA: Diagnosis not present

## 2018-12-03 DIAGNOSIS — D509 Iron deficiency anemia, unspecified: Secondary | ICD-10-CM | POA: Diagnosis not present

## 2018-12-03 DIAGNOSIS — D631 Anemia in chronic kidney disease: Secondary | ICD-10-CM | POA: Diagnosis not present

## 2018-12-03 DIAGNOSIS — Z992 Dependence on renal dialysis: Secondary | ICD-10-CM | POA: Diagnosis not present

## 2018-12-03 DIAGNOSIS — N186 End stage renal disease: Secondary | ICD-10-CM | POA: Diagnosis not present

## 2018-12-06 DIAGNOSIS — N186 End stage renal disease: Secondary | ICD-10-CM | POA: Diagnosis not present

## 2018-12-06 DIAGNOSIS — Z992 Dependence on renal dialysis: Secondary | ICD-10-CM | POA: Diagnosis not present

## 2018-12-06 DIAGNOSIS — N2581 Secondary hyperparathyroidism of renal origin: Secondary | ICD-10-CM | POA: Diagnosis not present

## 2018-12-06 DIAGNOSIS — D509 Iron deficiency anemia, unspecified: Secondary | ICD-10-CM | POA: Diagnosis not present

## 2018-12-06 DIAGNOSIS — D631 Anemia in chronic kidney disease: Secondary | ICD-10-CM | POA: Diagnosis not present

## 2018-12-08 DIAGNOSIS — D509 Iron deficiency anemia, unspecified: Secondary | ICD-10-CM | POA: Diagnosis not present

## 2018-12-08 DIAGNOSIS — D631 Anemia in chronic kidney disease: Secondary | ICD-10-CM | POA: Diagnosis not present

## 2018-12-08 DIAGNOSIS — N186 End stage renal disease: Secondary | ICD-10-CM | POA: Diagnosis not present

## 2018-12-08 DIAGNOSIS — N2581 Secondary hyperparathyroidism of renal origin: Secondary | ICD-10-CM | POA: Diagnosis not present

## 2018-12-08 DIAGNOSIS — Z992 Dependence on renal dialysis: Secondary | ICD-10-CM | POA: Diagnosis not present

## 2018-12-10 DIAGNOSIS — Z992 Dependence on renal dialysis: Secondary | ICD-10-CM | POA: Diagnosis not present

## 2018-12-10 DIAGNOSIS — N2581 Secondary hyperparathyroidism of renal origin: Secondary | ICD-10-CM | POA: Diagnosis not present

## 2018-12-10 DIAGNOSIS — D509 Iron deficiency anemia, unspecified: Secondary | ICD-10-CM | POA: Diagnosis not present

## 2018-12-10 DIAGNOSIS — N186 End stage renal disease: Secondary | ICD-10-CM | POA: Diagnosis not present

## 2018-12-10 DIAGNOSIS — D631 Anemia in chronic kidney disease: Secondary | ICD-10-CM | POA: Diagnosis not present

## 2018-12-13 DIAGNOSIS — Z992 Dependence on renal dialysis: Secondary | ICD-10-CM | POA: Diagnosis not present

## 2018-12-13 DIAGNOSIS — D631 Anemia in chronic kidney disease: Secondary | ICD-10-CM | POA: Diagnosis not present

## 2018-12-13 DIAGNOSIS — D509 Iron deficiency anemia, unspecified: Secondary | ICD-10-CM | POA: Diagnosis not present

## 2018-12-13 DIAGNOSIS — N186 End stage renal disease: Secondary | ICD-10-CM | POA: Diagnosis not present

## 2018-12-13 DIAGNOSIS — N2581 Secondary hyperparathyroidism of renal origin: Secondary | ICD-10-CM | POA: Diagnosis not present

## 2018-12-15 DIAGNOSIS — D631 Anemia in chronic kidney disease: Secondary | ICD-10-CM | POA: Diagnosis not present

## 2018-12-15 DIAGNOSIS — Z992 Dependence on renal dialysis: Secondary | ICD-10-CM | POA: Diagnosis not present

## 2018-12-15 DIAGNOSIS — N2581 Secondary hyperparathyroidism of renal origin: Secondary | ICD-10-CM | POA: Diagnosis not present

## 2018-12-15 DIAGNOSIS — N186 End stage renal disease: Secondary | ICD-10-CM | POA: Diagnosis not present

## 2018-12-15 DIAGNOSIS — D509 Iron deficiency anemia, unspecified: Secondary | ICD-10-CM | POA: Diagnosis not present

## 2018-12-18 DIAGNOSIS — Z23 Encounter for immunization: Secondary | ICD-10-CM | POA: Diagnosis not present

## 2018-12-18 DIAGNOSIS — D509 Iron deficiency anemia, unspecified: Secondary | ICD-10-CM | POA: Diagnosis not present

## 2018-12-18 DIAGNOSIS — Z992 Dependence on renal dialysis: Secondary | ICD-10-CM | POA: Diagnosis not present

## 2018-12-18 DIAGNOSIS — D631 Anemia in chronic kidney disease: Secondary | ICD-10-CM | POA: Diagnosis not present

## 2018-12-18 DIAGNOSIS — N186 End stage renal disease: Secondary | ICD-10-CM | POA: Diagnosis not present

## 2018-12-20 DIAGNOSIS — Z23 Encounter for immunization: Secondary | ICD-10-CM | POA: Diagnosis not present

## 2018-12-20 DIAGNOSIS — D631 Anemia in chronic kidney disease: Secondary | ICD-10-CM | POA: Diagnosis not present

## 2018-12-20 DIAGNOSIS — D509 Iron deficiency anemia, unspecified: Secondary | ICD-10-CM | POA: Diagnosis not present

## 2018-12-20 DIAGNOSIS — N186 End stage renal disease: Secondary | ICD-10-CM | POA: Diagnosis not present

## 2018-12-20 DIAGNOSIS — Z992 Dependence on renal dialysis: Secondary | ICD-10-CM | POA: Diagnosis not present

## 2018-12-21 DIAGNOSIS — R2232 Localized swelling, mass and lump, left upper limb: Secondary | ICD-10-CM | POA: Diagnosis not present

## 2018-12-21 DIAGNOSIS — N186 End stage renal disease: Secondary | ICD-10-CM | POA: Diagnosis not present

## 2018-12-21 DIAGNOSIS — R112 Nausea with vomiting, unspecified: Secondary | ICD-10-CM | POA: Diagnosis not present

## 2018-12-22 DIAGNOSIS — N186 End stage renal disease: Secondary | ICD-10-CM | POA: Diagnosis not present

## 2018-12-22 DIAGNOSIS — D631 Anemia in chronic kidney disease: Secondary | ICD-10-CM | POA: Diagnosis not present

## 2018-12-22 DIAGNOSIS — Z992 Dependence on renal dialysis: Secondary | ICD-10-CM | POA: Diagnosis not present

## 2018-12-22 DIAGNOSIS — Z23 Encounter for immunization: Secondary | ICD-10-CM | POA: Diagnosis not present

## 2018-12-22 DIAGNOSIS — D509 Iron deficiency anemia, unspecified: Secondary | ICD-10-CM | POA: Diagnosis not present

## 2018-12-24 DIAGNOSIS — Z23 Encounter for immunization: Secondary | ICD-10-CM | POA: Diagnosis not present

## 2018-12-24 DIAGNOSIS — D509 Iron deficiency anemia, unspecified: Secondary | ICD-10-CM | POA: Diagnosis not present

## 2018-12-24 DIAGNOSIS — Z992 Dependence on renal dialysis: Secondary | ICD-10-CM | POA: Diagnosis not present

## 2018-12-24 DIAGNOSIS — D631 Anemia in chronic kidney disease: Secondary | ICD-10-CM | POA: Diagnosis not present

## 2018-12-24 DIAGNOSIS — N186 End stage renal disease: Secondary | ICD-10-CM | POA: Diagnosis not present

## 2018-12-27 DIAGNOSIS — D509 Iron deficiency anemia, unspecified: Secondary | ICD-10-CM | POA: Diagnosis not present

## 2018-12-27 DIAGNOSIS — D631 Anemia in chronic kidney disease: Secondary | ICD-10-CM | POA: Diagnosis not present

## 2018-12-27 DIAGNOSIS — Z992 Dependence on renal dialysis: Secondary | ICD-10-CM | POA: Diagnosis not present

## 2018-12-27 DIAGNOSIS — Z23 Encounter for immunization: Secondary | ICD-10-CM | POA: Diagnosis not present

## 2018-12-27 DIAGNOSIS — N186 End stage renal disease: Secondary | ICD-10-CM | POA: Diagnosis not present

## 2018-12-29 DIAGNOSIS — Z23 Encounter for immunization: Secondary | ICD-10-CM | POA: Diagnosis not present

## 2018-12-29 DIAGNOSIS — D631 Anemia in chronic kidney disease: Secondary | ICD-10-CM | POA: Diagnosis not present

## 2018-12-29 DIAGNOSIS — Z992 Dependence on renal dialysis: Secondary | ICD-10-CM | POA: Diagnosis not present

## 2018-12-29 DIAGNOSIS — N186 End stage renal disease: Secondary | ICD-10-CM | POA: Diagnosis not present

## 2018-12-29 DIAGNOSIS — D509 Iron deficiency anemia, unspecified: Secondary | ICD-10-CM | POA: Diagnosis not present

## 2018-12-30 DIAGNOSIS — R2232 Localized swelling, mass and lump, left upper limb: Secondary | ICD-10-CM | POA: Diagnosis not present

## 2018-12-30 DIAGNOSIS — Z992 Dependence on renal dialysis: Secondary | ICD-10-CM | POA: Diagnosis not present

## 2018-12-30 DIAGNOSIS — Z5329 Procedure and treatment not carried out because of patient's decision for other reasons: Secondary | ICD-10-CM | POA: Diagnosis not present

## 2018-12-30 DIAGNOSIS — I120 Hypertensive chronic kidney disease with stage 5 chronic kidney disease or end stage renal disease: Secondary | ICD-10-CM | POA: Diagnosis not present

## 2018-12-30 DIAGNOSIS — N186 End stage renal disease: Secondary | ICD-10-CM | POA: Diagnosis not present

## 2018-12-30 DIAGNOSIS — B20 Human immunodeficiency virus [HIV] disease: Secondary | ICD-10-CM | POA: Diagnosis not present

## 2018-12-30 DIAGNOSIS — R112 Nausea with vomiting, unspecified: Secondary | ICD-10-CM | POA: Diagnosis not present

## 2018-12-30 DIAGNOSIS — I252 Old myocardial infarction: Secondary | ICD-10-CM | POA: Diagnosis not present

## 2018-12-31 DIAGNOSIS — Z23 Encounter for immunization: Secondary | ICD-10-CM | POA: Diagnosis not present

## 2018-12-31 DIAGNOSIS — D509 Iron deficiency anemia, unspecified: Secondary | ICD-10-CM | POA: Diagnosis not present

## 2018-12-31 DIAGNOSIS — D631 Anemia in chronic kidney disease: Secondary | ICD-10-CM | POA: Diagnosis not present

## 2018-12-31 DIAGNOSIS — N186 End stage renal disease: Secondary | ICD-10-CM | POA: Diagnosis not present

## 2018-12-31 DIAGNOSIS — Z992 Dependence on renal dialysis: Secondary | ICD-10-CM | POA: Diagnosis not present

## 2019-01-03 DIAGNOSIS — D631 Anemia in chronic kidney disease: Secondary | ICD-10-CM | POA: Diagnosis not present

## 2019-01-03 DIAGNOSIS — N186 End stage renal disease: Secondary | ICD-10-CM | POA: Diagnosis not present

## 2019-01-03 DIAGNOSIS — D509 Iron deficiency anemia, unspecified: Secondary | ICD-10-CM | POA: Diagnosis not present

## 2019-01-03 DIAGNOSIS — Z992 Dependence on renal dialysis: Secondary | ICD-10-CM | POA: Diagnosis not present

## 2019-01-03 DIAGNOSIS — Z23 Encounter for immunization: Secondary | ICD-10-CM | POA: Diagnosis not present

## 2019-01-05 DIAGNOSIS — Z992 Dependence on renal dialysis: Secondary | ICD-10-CM | POA: Diagnosis not present

## 2019-01-05 DIAGNOSIS — N186 End stage renal disease: Secondary | ICD-10-CM | POA: Diagnosis not present

## 2019-01-05 DIAGNOSIS — Z23 Encounter for immunization: Secondary | ICD-10-CM | POA: Diagnosis not present

## 2019-01-05 DIAGNOSIS — D509 Iron deficiency anemia, unspecified: Secondary | ICD-10-CM | POA: Diagnosis not present

## 2019-01-05 DIAGNOSIS — D631 Anemia in chronic kidney disease: Secondary | ICD-10-CM | POA: Diagnosis not present

## 2019-01-07 DIAGNOSIS — Z992 Dependence on renal dialysis: Secondary | ICD-10-CM | POA: Diagnosis not present

## 2019-01-07 DIAGNOSIS — N186 End stage renal disease: Secondary | ICD-10-CM | POA: Diagnosis not present

## 2019-01-07 DIAGNOSIS — Z23 Encounter for immunization: Secondary | ICD-10-CM | POA: Diagnosis not present

## 2019-01-07 DIAGNOSIS — D509 Iron deficiency anemia, unspecified: Secondary | ICD-10-CM | POA: Diagnosis not present

## 2019-01-07 DIAGNOSIS — D631 Anemia in chronic kidney disease: Secondary | ICD-10-CM | POA: Diagnosis not present

## 2019-01-10 DIAGNOSIS — D509 Iron deficiency anemia, unspecified: Secondary | ICD-10-CM | POA: Diagnosis not present

## 2019-01-10 DIAGNOSIS — Z992 Dependence on renal dialysis: Secondary | ICD-10-CM | POA: Diagnosis not present

## 2019-01-10 DIAGNOSIS — D631 Anemia in chronic kidney disease: Secondary | ICD-10-CM | POA: Diagnosis not present

## 2019-01-10 DIAGNOSIS — N186 End stage renal disease: Secondary | ICD-10-CM | POA: Diagnosis not present

## 2019-01-10 DIAGNOSIS — Z23 Encounter for immunization: Secondary | ICD-10-CM | POA: Diagnosis not present

## 2019-01-14 DIAGNOSIS — Z992 Dependence on renal dialysis: Secondary | ICD-10-CM | POA: Diagnosis not present

## 2019-01-14 DIAGNOSIS — D631 Anemia in chronic kidney disease: Secondary | ICD-10-CM | POA: Diagnosis not present

## 2019-01-14 DIAGNOSIS — Z23 Encounter for immunization: Secondary | ICD-10-CM | POA: Diagnosis not present

## 2019-01-14 DIAGNOSIS — N186 End stage renal disease: Secondary | ICD-10-CM | POA: Diagnosis not present

## 2019-01-14 DIAGNOSIS — D509 Iron deficiency anemia, unspecified: Secondary | ICD-10-CM | POA: Diagnosis not present

## 2019-01-16 DIAGNOSIS — Z992 Dependence on renal dialysis: Secondary | ICD-10-CM | POA: Diagnosis not present

## 2019-01-16 DIAGNOSIS — N186 End stage renal disease: Secondary | ICD-10-CM | POA: Diagnosis not present

## 2019-01-17 DIAGNOSIS — N186 End stage renal disease: Secondary | ICD-10-CM | POA: Diagnosis not present

## 2019-01-17 DIAGNOSIS — D631 Anemia in chronic kidney disease: Secondary | ICD-10-CM | POA: Diagnosis not present

## 2019-01-17 DIAGNOSIS — D509 Iron deficiency anemia, unspecified: Secondary | ICD-10-CM | POA: Diagnosis not present

## 2019-01-17 DIAGNOSIS — Z992 Dependence on renal dialysis: Secondary | ICD-10-CM | POA: Diagnosis not present

## 2019-01-19 DIAGNOSIS — D509 Iron deficiency anemia, unspecified: Secondary | ICD-10-CM | POA: Diagnosis not present

## 2019-01-19 DIAGNOSIS — Z992 Dependence on renal dialysis: Secondary | ICD-10-CM | POA: Diagnosis not present

## 2019-01-19 DIAGNOSIS — N186 End stage renal disease: Secondary | ICD-10-CM | POA: Diagnosis not present

## 2019-01-19 DIAGNOSIS — D631 Anemia in chronic kidney disease: Secondary | ICD-10-CM | POA: Diagnosis not present

## 2019-01-21 DIAGNOSIS — Z992 Dependence on renal dialysis: Secondary | ICD-10-CM | POA: Diagnosis not present

## 2019-01-21 DIAGNOSIS — D631 Anemia in chronic kidney disease: Secondary | ICD-10-CM | POA: Diagnosis not present

## 2019-01-21 DIAGNOSIS — N186 End stage renal disease: Secondary | ICD-10-CM | POA: Diagnosis not present

## 2019-01-21 DIAGNOSIS — D509 Iron deficiency anemia, unspecified: Secondary | ICD-10-CM | POA: Diagnosis not present

## 2019-01-24 DIAGNOSIS — D631 Anemia in chronic kidney disease: Secondary | ICD-10-CM | POA: Diagnosis not present

## 2019-01-24 DIAGNOSIS — D509 Iron deficiency anemia, unspecified: Secondary | ICD-10-CM | POA: Diagnosis not present

## 2019-01-24 DIAGNOSIS — N186 End stage renal disease: Secondary | ICD-10-CM | POA: Diagnosis not present

## 2019-01-24 DIAGNOSIS — Z992 Dependence on renal dialysis: Secondary | ICD-10-CM | POA: Diagnosis not present

## 2019-01-26 DIAGNOSIS — N186 End stage renal disease: Secondary | ICD-10-CM | POA: Diagnosis not present

## 2019-01-26 DIAGNOSIS — Z992 Dependence on renal dialysis: Secondary | ICD-10-CM | POA: Diagnosis not present

## 2019-01-26 DIAGNOSIS — D509 Iron deficiency anemia, unspecified: Secondary | ICD-10-CM | POA: Diagnosis not present

## 2019-01-26 DIAGNOSIS — D631 Anemia in chronic kidney disease: Secondary | ICD-10-CM | POA: Diagnosis not present

## 2019-01-28 DIAGNOSIS — D509 Iron deficiency anemia, unspecified: Secondary | ICD-10-CM | POA: Diagnosis not present

## 2019-01-28 DIAGNOSIS — Z992 Dependence on renal dialysis: Secondary | ICD-10-CM | POA: Diagnosis not present

## 2019-01-28 DIAGNOSIS — D631 Anemia in chronic kidney disease: Secondary | ICD-10-CM | POA: Diagnosis not present

## 2019-01-28 DIAGNOSIS — N186 End stage renal disease: Secondary | ICD-10-CM | POA: Diagnosis not present

## 2019-01-31 DIAGNOSIS — D509 Iron deficiency anemia, unspecified: Secondary | ICD-10-CM | POA: Diagnosis not present

## 2019-01-31 DIAGNOSIS — N186 End stage renal disease: Secondary | ICD-10-CM | POA: Diagnosis not present

## 2019-01-31 DIAGNOSIS — D631 Anemia in chronic kidney disease: Secondary | ICD-10-CM | POA: Diagnosis not present

## 2019-01-31 DIAGNOSIS — Z992 Dependence on renal dialysis: Secondary | ICD-10-CM | POA: Diagnosis not present

## 2019-02-02 DIAGNOSIS — N186 End stage renal disease: Secondary | ICD-10-CM | POA: Diagnosis not present

## 2019-02-02 DIAGNOSIS — D631 Anemia in chronic kidney disease: Secondary | ICD-10-CM | POA: Diagnosis not present

## 2019-02-02 DIAGNOSIS — Z992 Dependence on renal dialysis: Secondary | ICD-10-CM | POA: Diagnosis not present

## 2019-02-02 DIAGNOSIS — D509 Iron deficiency anemia, unspecified: Secondary | ICD-10-CM | POA: Diagnosis not present

## 2019-02-04 DIAGNOSIS — Z992 Dependence on renal dialysis: Secondary | ICD-10-CM | POA: Diagnosis not present

## 2019-02-04 DIAGNOSIS — N186 End stage renal disease: Secondary | ICD-10-CM | POA: Diagnosis not present

## 2019-02-04 DIAGNOSIS — D631 Anemia in chronic kidney disease: Secondary | ICD-10-CM | POA: Diagnosis not present

## 2019-02-04 DIAGNOSIS — D509 Iron deficiency anemia, unspecified: Secondary | ICD-10-CM | POA: Diagnosis not present

## 2019-02-07 DIAGNOSIS — N186 End stage renal disease: Secondary | ICD-10-CM | POA: Diagnosis not present

## 2019-02-07 DIAGNOSIS — D631 Anemia in chronic kidney disease: Secondary | ICD-10-CM | POA: Diagnosis not present

## 2019-02-07 DIAGNOSIS — Z992 Dependence on renal dialysis: Secondary | ICD-10-CM | POA: Diagnosis not present

## 2019-02-07 DIAGNOSIS — D509 Iron deficiency anemia, unspecified: Secondary | ICD-10-CM | POA: Diagnosis not present

## 2019-02-08 DIAGNOSIS — N186 End stage renal disease: Secondary | ICD-10-CM | POA: Diagnosis not present

## 2019-02-09 DIAGNOSIS — D509 Iron deficiency anemia, unspecified: Secondary | ICD-10-CM | POA: Diagnosis not present

## 2019-02-09 DIAGNOSIS — D631 Anemia in chronic kidney disease: Secondary | ICD-10-CM | POA: Diagnosis not present

## 2019-02-09 DIAGNOSIS — N186 End stage renal disease: Secondary | ICD-10-CM | POA: Diagnosis not present

## 2019-02-09 DIAGNOSIS — Z992 Dependence on renal dialysis: Secondary | ICD-10-CM | POA: Diagnosis not present

## 2019-02-11 DIAGNOSIS — D631 Anemia in chronic kidney disease: Secondary | ICD-10-CM | POA: Diagnosis not present

## 2019-02-11 DIAGNOSIS — D509 Iron deficiency anemia, unspecified: Secondary | ICD-10-CM | POA: Diagnosis not present

## 2019-02-11 DIAGNOSIS — N186 End stage renal disease: Secondary | ICD-10-CM | POA: Diagnosis not present

## 2019-02-11 DIAGNOSIS — Z992 Dependence on renal dialysis: Secondary | ICD-10-CM | POA: Diagnosis not present

## 2019-02-14 DIAGNOSIS — D509 Iron deficiency anemia, unspecified: Secondary | ICD-10-CM | POA: Diagnosis not present

## 2019-02-14 DIAGNOSIS — N186 End stage renal disease: Secondary | ICD-10-CM | POA: Diagnosis not present

## 2019-02-14 DIAGNOSIS — Z992 Dependence on renal dialysis: Secondary | ICD-10-CM | POA: Diagnosis not present

## 2019-02-14 DIAGNOSIS — D631 Anemia in chronic kidney disease: Secondary | ICD-10-CM | POA: Diagnosis not present

## 2019-02-16 DIAGNOSIS — N186 End stage renal disease: Secondary | ICD-10-CM | POA: Diagnosis not present

## 2019-02-16 DIAGNOSIS — Z992 Dependence on renal dialysis: Secondary | ICD-10-CM | POA: Diagnosis not present

## 2019-02-16 DIAGNOSIS — T8241XD Breakdown (mechanical) of vascular dialysis catheter, subsequent encounter: Secondary | ICD-10-CM | POA: Diagnosis not present

## 2019-02-18 DIAGNOSIS — D509 Iron deficiency anemia, unspecified: Secondary | ICD-10-CM | POA: Diagnosis not present

## 2019-02-18 DIAGNOSIS — Z992 Dependence on renal dialysis: Secondary | ICD-10-CM | POA: Diagnosis not present

## 2019-02-18 DIAGNOSIS — N186 End stage renal disease: Secondary | ICD-10-CM | POA: Diagnosis not present

## 2019-02-18 DIAGNOSIS — N2581 Secondary hyperparathyroidism of renal origin: Secondary | ICD-10-CM | POA: Diagnosis not present

## 2019-02-18 DIAGNOSIS — D631 Anemia in chronic kidney disease: Secondary | ICD-10-CM | POA: Diagnosis not present

## 2019-02-21 DIAGNOSIS — D631 Anemia in chronic kidney disease: Secondary | ICD-10-CM | POA: Diagnosis not present

## 2019-02-21 DIAGNOSIS — N2581 Secondary hyperparathyroidism of renal origin: Secondary | ICD-10-CM | POA: Diagnosis not present

## 2019-02-21 DIAGNOSIS — N186 End stage renal disease: Secondary | ICD-10-CM | POA: Diagnosis not present

## 2019-02-21 DIAGNOSIS — D509 Iron deficiency anemia, unspecified: Secondary | ICD-10-CM | POA: Diagnosis not present

## 2019-02-21 DIAGNOSIS — Z992 Dependence on renal dialysis: Secondary | ICD-10-CM | POA: Diagnosis not present

## 2019-02-23 DIAGNOSIS — D509 Iron deficiency anemia, unspecified: Secondary | ICD-10-CM | POA: Diagnosis not present

## 2019-02-23 DIAGNOSIS — Z992 Dependence on renal dialysis: Secondary | ICD-10-CM | POA: Diagnosis not present

## 2019-02-23 DIAGNOSIS — N2581 Secondary hyperparathyroidism of renal origin: Secondary | ICD-10-CM | POA: Diagnosis not present

## 2019-02-23 DIAGNOSIS — N186 End stage renal disease: Secondary | ICD-10-CM | POA: Diagnosis not present

## 2019-02-23 DIAGNOSIS — D631 Anemia in chronic kidney disease: Secondary | ICD-10-CM | POA: Diagnosis not present

## 2019-02-25 DIAGNOSIS — N2581 Secondary hyperparathyroidism of renal origin: Secondary | ICD-10-CM | POA: Diagnosis not present

## 2019-02-25 DIAGNOSIS — D631 Anemia in chronic kidney disease: Secondary | ICD-10-CM | POA: Diagnosis not present

## 2019-02-25 DIAGNOSIS — N186 End stage renal disease: Secondary | ICD-10-CM | POA: Diagnosis not present

## 2019-02-25 DIAGNOSIS — D509 Iron deficiency anemia, unspecified: Secondary | ICD-10-CM | POA: Diagnosis not present

## 2019-02-25 DIAGNOSIS — Z992 Dependence on renal dialysis: Secondary | ICD-10-CM | POA: Diagnosis not present

## 2019-02-28 DIAGNOSIS — Z992 Dependence on renal dialysis: Secondary | ICD-10-CM | POA: Diagnosis not present

## 2019-02-28 DIAGNOSIS — N186 End stage renal disease: Secondary | ICD-10-CM | POA: Diagnosis not present

## 2019-02-28 DIAGNOSIS — N2581 Secondary hyperparathyroidism of renal origin: Secondary | ICD-10-CM | POA: Diagnosis not present

## 2019-02-28 DIAGNOSIS — D631 Anemia in chronic kidney disease: Secondary | ICD-10-CM | POA: Diagnosis not present

## 2019-02-28 DIAGNOSIS — D509 Iron deficiency anemia, unspecified: Secondary | ICD-10-CM | POA: Diagnosis not present

## 2019-03-02 DIAGNOSIS — D509 Iron deficiency anemia, unspecified: Secondary | ICD-10-CM | POA: Diagnosis not present

## 2019-03-02 DIAGNOSIS — D631 Anemia in chronic kidney disease: Secondary | ICD-10-CM | POA: Diagnosis not present

## 2019-03-02 DIAGNOSIS — Z992 Dependence on renal dialysis: Secondary | ICD-10-CM | POA: Diagnosis not present

## 2019-03-02 DIAGNOSIS — N2581 Secondary hyperparathyroidism of renal origin: Secondary | ICD-10-CM | POA: Diagnosis not present

## 2019-03-02 DIAGNOSIS — N186 End stage renal disease: Secondary | ICD-10-CM | POA: Diagnosis not present

## 2019-03-04 DIAGNOSIS — N2581 Secondary hyperparathyroidism of renal origin: Secondary | ICD-10-CM | POA: Diagnosis not present

## 2019-03-04 DIAGNOSIS — N186 End stage renal disease: Secondary | ICD-10-CM | POA: Diagnosis not present

## 2019-03-04 DIAGNOSIS — D509 Iron deficiency anemia, unspecified: Secondary | ICD-10-CM | POA: Diagnosis not present

## 2019-03-04 DIAGNOSIS — D631 Anemia in chronic kidney disease: Secondary | ICD-10-CM | POA: Diagnosis not present

## 2019-03-04 DIAGNOSIS — Z992 Dependence on renal dialysis: Secondary | ICD-10-CM | POA: Diagnosis not present

## 2019-03-07 DIAGNOSIS — D631 Anemia in chronic kidney disease: Secondary | ICD-10-CM | POA: Diagnosis not present

## 2019-03-07 DIAGNOSIS — N2581 Secondary hyperparathyroidism of renal origin: Secondary | ICD-10-CM | POA: Diagnosis not present

## 2019-03-07 DIAGNOSIS — D509 Iron deficiency anemia, unspecified: Secondary | ICD-10-CM | POA: Diagnosis not present

## 2019-03-07 DIAGNOSIS — Z992 Dependence on renal dialysis: Secondary | ICD-10-CM | POA: Diagnosis not present

## 2019-03-07 DIAGNOSIS — N186 End stage renal disease: Secondary | ICD-10-CM | POA: Diagnosis not present

## 2019-03-09 DIAGNOSIS — N186 End stage renal disease: Secondary | ICD-10-CM | POA: Diagnosis not present

## 2019-03-09 DIAGNOSIS — Z992 Dependence on renal dialysis: Secondary | ICD-10-CM | POA: Diagnosis not present

## 2019-03-09 DIAGNOSIS — D631 Anemia in chronic kidney disease: Secondary | ICD-10-CM | POA: Diagnosis not present

## 2019-03-09 DIAGNOSIS — N2581 Secondary hyperparathyroidism of renal origin: Secondary | ICD-10-CM | POA: Diagnosis not present

## 2019-03-09 DIAGNOSIS — D509 Iron deficiency anemia, unspecified: Secondary | ICD-10-CM | POA: Diagnosis not present

## 2019-03-11 DIAGNOSIS — D509 Iron deficiency anemia, unspecified: Secondary | ICD-10-CM | POA: Diagnosis not present

## 2019-03-11 DIAGNOSIS — N2581 Secondary hyperparathyroidism of renal origin: Secondary | ICD-10-CM | POA: Diagnosis not present

## 2019-03-11 DIAGNOSIS — D631 Anemia in chronic kidney disease: Secondary | ICD-10-CM | POA: Diagnosis not present

## 2019-03-11 DIAGNOSIS — Z992 Dependence on renal dialysis: Secondary | ICD-10-CM | POA: Diagnosis not present

## 2019-03-11 DIAGNOSIS — N186 End stage renal disease: Secondary | ICD-10-CM | POA: Diagnosis not present

## 2019-03-14 DIAGNOSIS — Z992 Dependence on renal dialysis: Secondary | ICD-10-CM | POA: Diagnosis not present

## 2019-03-14 DIAGNOSIS — D631 Anemia in chronic kidney disease: Secondary | ICD-10-CM | POA: Diagnosis not present

## 2019-03-14 DIAGNOSIS — N186 End stage renal disease: Secondary | ICD-10-CM | POA: Diagnosis not present

## 2019-03-14 DIAGNOSIS — D509 Iron deficiency anemia, unspecified: Secondary | ICD-10-CM | POA: Diagnosis not present

## 2019-03-14 DIAGNOSIS — N2581 Secondary hyperparathyroidism of renal origin: Secondary | ICD-10-CM | POA: Diagnosis not present

## 2019-03-16 DIAGNOSIS — N2581 Secondary hyperparathyroidism of renal origin: Secondary | ICD-10-CM | POA: Diagnosis not present

## 2019-03-16 DIAGNOSIS — D631 Anemia in chronic kidney disease: Secondary | ICD-10-CM | POA: Diagnosis not present

## 2019-03-16 DIAGNOSIS — Z992 Dependence on renal dialysis: Secondary | ICD-10-CM | POA: Diagnosis not present

## 2019-03-16 DIAGNOSIS — N186 End stage renal disease: Secondary | ICD-10-CM | POA: Diagnosis not present

## 2019-03-16 DIAGNOSIS — D509 Iron deficiency anemia, unspecified: Secondary | ICD-10-CM | POA: Diagnosis not present

## 2019-03-18 DIAGNOSIS — D509 Iron deficiency anemia, unspecified: Secondary | ICD-10-CM | POA: Diagnosis not present

## 2019-03-18 DIAGNOSIS — N186 End stage renal disease: Secondary | ICD-10-CM | POA: Diagnosis not present

## 2019-03-18 DIAGNOSIS — Z992 Dependence on renal dialysis: Secondary | ICD-10-CM | POA: Diagnosis not present

## 2019-03-18 DIAGNOSIS — Z23 Encounter for immunization: Secondary | ICD-10-CM | POA: Diagnosis not present

## 2019-03-18 DIAGNOSIS — L0501 Pilonidal cyst with abscess: Secondary | ICD-10-CM | POA: Diagnosis not present

## 2019-03-18 DIAGNOSIS — D631 Anemia in chronic kidney disease: Secondary | ICD-10-CM | POA: Diagnosis not present

## 2019-03-21 DIAGNOSIS — D631 Anemia in chronic kidney disease: Secondary | ICD-10-CM | POA: Diagnosis not present

## 2019-03-21 DIAGNOSIS — Z992 Dependence on renal dialysis: Secondary | ICD-10-CM | POA: Diagnosis not present

## 2019-03-21 DIAGNOSIS — N186 End stage renal disease: Secondary | ICD-10-CM | POA: Diagnosis not present

## 2019-03-21 DIAGNOSIS — Z23 Encounter for immunization: Secondary | ICD-10-CM | POA: Diagnosis not present

## 2019-03-21 DIAGNOSIS — D509 Iron deficiency anemia, unspecified: Secondary | ICD-10-CM | POA: Diagnosis not present

## 2019-03-23 DIAGNOSIS — Z992 Dependence on renal dialysis: Secondary | ICD-10-CM | POA: Diagnosis not present

## 2019-03-23 DIAGNOSIS — D631 Anemia in chronic kidney disease: Secondary | ICD-10-CM | POA: Diagnosis not present

## 2019-03-23 DIAGNOSIS — D509 Iron deficiency anemia, unspecified: Secondary | ICD-10-CM | POA: Diagnosis not present

## 2019-03-23 DIAGNOSIS — N186 End stage renal disease: Secondary | ICD-10-CM | POA: Diagnosis not present

## 2019-03-23 DIAGNOSIS — Z23 Encounter for immunization: Secondary | ICD-10-CM | POA: Diagnosis not present

## 2019-03-25 DIAGNOSIS — Z23 Encounter for immunization: Secondary | ICD-10-CM | POA: Diagnosis not present

## 2019-03-25 DIAGNOSIS — N186 End stage renal disease: Secondary | ICD-10-CM | POA: Diagnosis not present

## 2019-03-25 DIAGNOSIS — D509 Iron deficiency anemia, unspecified: Secondary | ICD-10-CM | POA: Diagnosis not present

## 2019-03-25 DIAGNOSIS — Z992 Dependence on renal dialysis: Secondary | ICD-10-CM | POA: Diagnosis not present

## 2019-03-25 DIAGNOSIS — D631 Anemia in chronic kidney disease: Secondary | ICD-10-CM | POA: Diagnosis not present

## 2019-03-28 DIAGNOSIS — D509 Iron deficiency anemia, unspecified: Secondary | ICD-10-CM | POA: Diagnosis not present

## 2019-03-28 DIAGNOSIS — D631 Anemia in chronic kidney disease: Secondary | ICD-10-CM | POA: Diagnosis not present

## 2019-03-28 DIAGNOSIS — Z992 Dependence on renal dialysis: Secondary | ICD-10-CM | POA: Diagnosis not present

## 2019-03-28 DIAGNOSIS — N186 End stage renal disease: Secondary | ICD-10-CM | POA: Diagnosis not present

## 2019-03-28 DIAGNOSIS — Z23 Encounter for immunization: Secondary | ICD-10-CM | POA: Diagnosis not present

## 2019-03-30 DIAGNOSIS — D509 Iron deficiency anemia, unspecified: Secondary | ICD-10-CM | POA: Diagnosis not present

## 2019-03-30 DIAGNOSIS — Z992 Dependence on renal dialysis: Secondary | ICD-10-CM | POA: Diagnosis not present

## 2019-03-30 DIAGNOSIS — N186 End stage renal disease: Secondary | ICD-10-CM | POA: Diagnosis not present

## 2019-03-30 DIAGNOSIS — D631 Anemia in chronic kidney disease: Secondary | ICD-10-CM | POA: Diagnosis not present

## 2019-03-30 DIAGNOSIS — Z23 Encounter for immunization: Secondary | ICD-10-CM | POA: Diagnosis not present

## 2019-04-01 DIAGNOSIS — N186 End stage renal disease: Secondary | ICD-10-CM | POA: Diagnosis not present

## 2019-04-01 DIAGNOSIS — Z23 Encounter for immunization: Secondary | ICD-10-CM | POA: Diagnosis not present

## 2019-04-01 DIAGNOSIS — D631 Anemia in chronic kidney disease: Secondary | ICD-10-CM | POA: Diagnosis not present

## 2019-04-01 DIAGNOSIS — D509 Iron deficiency anemia, unspecified: Secondary | ICD-10-CM | POA: Diagnosis not present

## 2019-04-01 DIAGNOSIS — Z992 Dependence on renal dialysis: Secondary | ICD-10-CM | POA: Diagnosis not present

## 2019-04-04 DIAGNOSIS — N186 End stage renal disease: Secondary | ICD-10-CM | POA: Diagnosis not present

## 2019-04-04 DIAGNOSIS — D631 Anemia in chronic kidney disease: Secondary | ICD-10-CM | POA: Diagnosis not present

## 2019-04-04 DIAGNOSIS — D509 Iron deficiency anemia, unspecified: Secondary | ICD-10-CM | POA: Diagnosis not present

## 2019-04-04 DIAGNOSIS — Z992 Dependence on renal dialysis: Secondary | ICD-10-CM | POA: Diagnosis not present

## 2019-04-04 DIAGNOSIS — Z23 Encounter for immunization: Secondary | ICD-10-CM | POA: Diagnosis not present

## 2019-04-06 DIAGNOSIS — D631 Anemia in chronic kidney disease: Secondary | ICD-10-CM | POA: Diagnosis not present

## 2019-04-06 DIAGNOSIS — Z23 Encounter for immunization: Secondary | ICD-10-CM | POA: Diagnosis not present

## 2019-04-06 DIAGNOSIS — N186 End stage renal disease: Secondary | ICD-10-CM | POA: Diagnosis not present

## 2019-04-06 DIAGNOSIS — Z992 Dependence on renal dialysis: Secondary | ICD-10-CM | POA: Diagnosis not present

## 2019-04-06 DIAGNOSIS — D509 Iron deficiency anemia, unspecified: Secondary | ICD-10-CM | POA: Diagnosis not present

## 2019-04-08 DIAGNOSIS — Z23 Encounter for immunization: Secondary | ICD-10-CM | POA: Diagnosis not present

## 2019-04-08 DIAGNOSIS — N186 End stage renal disease: Secondary | ICD-10-CM | POA: Diagnosis not present

## 2019-04-08 DIAGNOSIS — D509 Iron deficiency anemia, unspecified: Secondary | ICD-10-CM | POA: Diagnosis not present

## 2019-04-08 DIAGNOSIS — D631 Anemia in chronic kidney disease: Secondary | ICD-10-CM | POA: Diagnosis not present

## 2019-04-08 DIAGNOSIS — Z992 Dependence on renal dialysis: Secondary | ICD-10-CM | POA: Diagnosis not present

## 2019-04-10 DIAGNOSIS — R0989 Other specified symptoms and signs involving the circulatory and respiratory systems: Secondary | ICD-10-CM | POA: Diagnosis not present

## 2019-04-10 DIAGNOSIS — R079 Chest pain, unspecified: Secondary | ICD-10-CM | POA: Diagnosis not present

## 2019-04-10 DIAGNOSIS — I161 Hypertensive emergency: Secondary | ICD-10-CM | POA: Diagnosis not present

## 2019-04-10 DIAGNOSIS — Z4931 Encounter for adequacy testing for hemodialysis: Secondary | ICD-10-CM | POA: Diagnosis not present

## 2019-04-10 DIAGNOSIS — Z720 Tobacco use: Secondary | ICD-10-CM | POA: Diagnosis not present

## 2019-04-10 DIAGNOSIS — R457 State of emotional shock and stress, unspecified: Secondary | ICD-10-CM | POA: Diagnosis not present

## 2019-04-10 DIAGNOSIS — R140 Abdominal distension (gaseous): Secondary | ICD-10-CM | POA: Diagnosis not present

## 2019-04-10 DIAGNOSIS — K8020 Calculus of gallbladder without cholecystitis without obstruction: Secondary | ICD-10-CM | POA: Diagnosis not present

## 2019-04-10 DIAGNOSIS — Z7401 Bed confinement status: Secondary | ICD-10-CM | POA: Diagnosis not present

## 2019-04-10 DIAGNOSIS — J811 Chronic pulmonary edema: Secondary | ICD-10-CM | POA: Diagnosis not present

## 2019-04-10 DIAGNOSIS — E785 Hyperlipidemia, unspecified: Secondary | ICD-10-CM | POA: Diagnosis present

## 2019-04-10 DIAGNOSIS — I214 Non-ST elevation (NSTEMI) myocardial infarction: Secondary | ICD-10-CM | POA: Diagnosis not present

## 2019-04-10 DIAGNOSIS — Z992 Dependence on renal dialysis: Secondary | ICD-10-CM | POA: Diagnosis not present

## 2019-04-10 DIAGNOSIS — Z79899 Other long term (current) drug therapy: Secondary | ICD-10-CM | POA: Diagnosis not present

## 2019-04-10 DIAGNOSIS — I6523 Occlusion and stenosis of bilateral carotid arteries: Secondary | ICD-10-CM | POA: Diagnosis not present

## 2019-04-10 DIAGNOSIS — R0789 Other chest pain: Secondary | ICD-10-CM | POA: Diagnosis not present

## 2019-04-10 DIAGNOSIS — Z9989 Dependence on other enabling machines and devices: Secondary | ICD-10-CM | POA: Diagnosis not present

## 2019-04-10 DIAGNOSIS — L7632 Postprocedural hematoma of skin and subcutaneous tissue following other procedure: Secondary | ICD-10-CM | POA: Diagnosis not present

## 2019-04-10 DIAGNOSIS — I2510 Atherosclerotic heart disease of native coronary artery without angina pectoris: Secondary | ICD-10-CM | POA: Diagnosis not present

## 2019-04-10 DIAGNOSIS — I5042 Chronic combined systolic (congestive) and diastolic (congestive) heart failure: Secondary | ICD-10-CM | POA: Diagnosis not present

## 2019-04-10 DIAGNOSIS — Z21 Asymptomatic human immunodeficiency virus [HIV] infection status: Secondary | ICD-10-CM | POA: Diagnosis present

## 2019-04-10 DIAGNOSIS — R918 Other nonspecific abnormal finding of lung field: Secondary | ICD-10-CM | POA: Diagnosis not present

## 2019-04-10 DIAGNOSIS — I5023 Acute on chronic systolic (congestive) heart failure: Secondary | ICD-10-CM | POA: Diagnosis not present

## 2019-04-10 DIAGNOSIS — Z951 Presence of aortocoronary bypass graft: Secondary | ICD-10-CM | POA: Diagnosis not present

## 2019-04-10 DIAGNOSIS — K760 Fatty (change of) liver, not elsewhere classified: Secondary | ICD-10-CM | POA: Diagnosis not present

## 2019-04-10 DIAGNOSIS — N179 Acute kidney failure, unspecified: Secondary | ICD-10-CM | POA: Diagnosis not present

## 2019-04-10 DIAGNOSIS — I255 Ischemic cardiomyopathy: Secondary | ICD-10-CM | POA: Diagnosis present

## 2019-04-10 DIAGNOSIS — I120 Hypertensive chronic kidney disease with stage 5 chronic kidney disease or end stage renal disease: Secondary | ICD-10-CM | POA: Diagnosis not present

## 2019-04-10 DIAGNOSIS — I259 Chronic ischemic heart disease, unspecified: Secondary | ICD-10-CM | POA: Diagnosis not present

## 2019-04-10 DIAGNOSIS — I132 Hypertensive heart and chronic kidney disease with heart failure and with stage 5 chronic kidney disease, or end stage renal disease: Secondary | ICD-10-CM | POA: Diagnosis not present

## 2019-04-10 DIAGNOSIS — Z01810 Encounter for preprocedural cardiovascular examination: Secondary | ICD-10-CM | POA: Diagnosis not present

## 2019-04-10 DIAGNOSIS — Z452 Encounter for adjustment and management of vascular access device: Secondary | ICD-10-CM | POA: Diagnosis not present

## 2019-04-10 DIAGNOSIS — N186 End stage renal disease: Secondary | ICD-10-CM | POA: Diagnosis not present

## 2019-04-10 DIAGNOSIS — M899 Disorder of bone, unspecified: Secondary | ICD-10-CM | POA: Diagnosis not present

## 2019-04-10 DIAGNOSIS — N250 Renal osteodystrophy: Secondary | ICD-10-CM | POA: Diagnosis not present

## 2019-04-10 DIAGNOSIS — B20 Human immunodeficiency virus [HIV] disease: Secondary | ICD-10-CM | POA: Diagnosis not present

## 2019-04-10 DIAGNOSIS — D631 Anemia in chronic kidney disease: Secondary | ICD-10-CM | POA: Diagnosis not present

## 2019-04-10 DIAGNOSIS — Z95811 Presence of heart assist device: Secondary | ICD-10-CM | POA: Diagnosis not present

## 2019-04-12 DIAGNOSIS — Z20828 Contact with and (suspected) exposure to other viral communicable diseases: Secondary | ICD-10-CM | POA: Diagnosis present

## 2019-04-12 DIAGNOSIS — I10 Essential (primary) hypertension: Secondary | ICD-10-CM | POA: Diagnosis not present

## 2019-04-12 DIAGNOSIS — I272 Pulmonary hypertension, unspecified: Secondary | ICD-10-CM | POA: Diagnosis not present

## 2019-04-12 DIAGNOSIS — I214 Non-ST elevation (NSTEMI) myocardial infarction: Secondary | ICD-10-CM | POA: Diagnosis present

## 2019-04-12 DIAGNOSIS — Z515 Encounter for palliative care: Secondary | ICD-10-CM | POA: Diagnosis not present

## 2019-04-12 DIAGNOSIS — N179 Acute kidney failure, unspecified: Secondary | ICD-10-CM | POA: Diagnosis not present

## 2019-04-12 DIAGNOSIS — I5023 Acute on chronic systolic (congestive) heart failure: Secondary | ICD-10-CM | POA: Diagnosis not present

## 2019-04-12 DIAGNOSIS — Z79899 Other long term (current) drug therapy: Secondary | ICD-10-CM | POA: Diagnosis not present

## 2019-04-12 DIAGNOSIS — I200 Unstable angina: Secondary | ICD-10-CM | POA: Diagnosis not present

## 2019-04-12 DIAGNOSIS — I132 Hypertensive heart and chronic kidney disease with heart failure and with stage 5 chronic kidney disease, or end stage renal disease: Secondary | ICD-10-CM | POA: Diagnosis not present

## 2019-04-12 DIAGNOSIS — N250 Renal osteodystrophy: Secondary | ICD-10-CM | POA: Diagnosis present

## 2019-04-12 DIAGNOSIS — R601 Generalized edema: Secondary | ICD-10-CM | POA: Diagnosis not present

## 2019-04-12 DIAGNOSIS — E785 Hyperlipidemia, unspecified: Secondary | ICD-10-CM | POA: Diagnosis present

## 2019-04-12 DIAGNOSIS — D509 Iron deficiency anemia, unspecified: Secondary | ICD-10-CM | POA: Diagnosis present

## 2019-04-12 DIAGNOSIS — D631 Anemia in chronic kidney disease: Secondary | ICD-10-CM | POA: Diagnosis not present

## 2019-04-12 DIAGNOSIS — Z7189 Other specified counseling: Secondary | ICD-10-CM | POA: Diagnosis not present

## 2019-04-12 DIAGNOSIS — I5022 Chronic systolic (congestive) heart failure: Secondary | ICD-10-CM | POA: Diagnosis not present

## 2019-04-12 DIAGNOSIS — Z992 Dependence on renal dialysis: Secondary | ICD-10-CM | POA: Diagnosis not present

## 2019-04-12 DIAGNOSIS — B20 Human immunodeficiency virus [HIV] disease: Secondary | ICD-10-CM | POA: Diagnosis present

## 2019-04-12 DIAGNOSIS — I130 Hypertensive heart and chronic kidney disease with heart failure and stage 1 through stage 4 chronic kidney disease, or unspecified chronic kidney disease: Secondary | ICD-10-CM | POA: Diagnosis not present

## 2019-04-12 DIAGNOSIS — Z951 Presence of aortocoronary bypass graft: Secondary | ICD-10-CM | POA: Diagnosis not present

## 2019-04-12 DIAGNOSIS — I252 Old myocardial infarction: Secondary | ICD-10-CM | POA: Diagnosis not present

## 2019-04-12 DIAGNOSIS — K5903 Drug induced constipation: Secondary | ICD-10-CM | POA: Diagnosis not present

## 2019-04-12 DIAGNOSIS — I120 Hypertensive chronic kidney disease with stage 5 chronic kidney disease or end stage renal disease: Secondary | ICD-10-CM | POA: Diagnosis not present

## 2019-04-12 DIAGNOSIS — Z8572 Personal history of non-Hodgkin lymphomas: Secondary | ICD-10-CM | POA: Diagnosis not present

## 2019-04-12 DIAGNOSIS — T402X5A Adverse effect of other opioids, initial encounter: Secondary | ICD-10-CM | POA: Diagnosis not present

## 2019-04-12 DIAGNOSIS — E782 Mixed hyperlipidemia: Secondary | ICD-10-CM | POA: Diagnosis not present

## 2019-04-12 DIAGNOSIS — R457 State of emotional shock and stress, unspecified: Secondary | ICD-10-CM | POA: Diagnosis not present

## 2019-04-12 DIAGNOSIS — I2510 Atherosclerotic heart disease of native coronary artery without angina pectoris: Secondary | ICD-10-CM | POA: Diagnosis present

## 2019-04-12 DIAGNOSIS — I208 Other forms of angina pectoris: Secondary | ICD-10-CM | POA: Diagnosis not present

## 2019-04-12 DIAGNOSIS — Z8249 Family history of ischemic heart disease and other diseases of the circulatory system: Secondary | ICD-10-CM | POA: Diagnosis not present

## 2019-04-12 DIAGNOSIS — R5381 Other malaise: Secondary | ICD-10-CM | POA: Diagnosis not present

## 2019-04-12 DIAGNOSIS — Z87891 Personal history of nicotine dependence: Secondary | ICD-10-CM | POA: Diagnosis not present

## 2019-04-12 DIAGNOSIS — Z4931 Encounter for adequacy testing for hemodialysis: Secondary | ICD-10-CM | POA: Diagnosis not present

## 2019-04-12 DIAGNOSIS — L7632 Postprocedural hematoma of skin and subcutaneous tissue following other procedure: Secondary | ICD-10-CM | POA: Diagnosis not present

## 2019-04-12 DIAGNOSIS — M7981 Nontraumatic hematoma of soft tissue: Secondary | ICD-10-CM | POA: Diagnosis not present

## 2019-04-12 DIAGNOSIS — D649 Anemia, unspecified: Secondary | ICD-10-CM | POA: Diagnosis not present

## 2019-04-12 DIAGNOSIS — K5900 Constipation, unspecified: Secondary | ICD-10-CM | POA: Diagnosis not present

## 2019-04-12 DIAGNOSIS — N186 End stage renal disease: Secondary | ICD-10-CM | POA: Diagnosis present

## 2019-04-12 DIAGNOSIS — I255 Ischemic cardiomyopathy: Secondary | ICD-10-CM | POA: Diagnosis present

## 2019-04-12 DIAGNOSIS — R079 Chest pain, unspecified: Secondary | ICD-10-CM | POA: Diagnosis not present

## 2019-04-12 DIAGNOSIS — M899 Disorder of bone, unspecified: Secondary | ICD-10-CM | POA: Diagnosis not present

## 2019-04-12 DIAGNOSIS — R931 Abnormal findings on diagnostic imaging of heart and coronary circulation: Secondary | ICD-10-CM | POA: Diagnosis not present

## 2019-04-12 DIAGNOSIS — I2582 Chronic total occlusion of coronary artery: Secondary | ICD-10-CM | POA: Diagnosis not present

## 2019-04-12 DIAGNOSIS — Z66 Do not resuscitate: Secondary | ICD-10-CM | POA: Diagnosis not present

## 2019-04-12 DIAGNOSIS — R918 Other nonspecific abnormal finding of lung field: Secondary | ICD-10-CM | POA: Diagnosis not present

## 2019-04-12 DIAGNOSIS — I5042 Chronic combined systolic (congestive) and diastolic (congestive) heart failure: Secondary | ICD-10-CM | POA: Diagnosis present

## 2019-04-12 DIAGNOSIS — Z21 Asymptomatic human immunodeficiency virus [HIV] infection status: Secondary | ICD-10-CM | POA: Diagnosis not present

## 2019-04-18 DIAGNOSIS — Z992 Dependence on renal dialysis: Secondary | ICD-10-CM | POA: Diagnosis not present

## 2019-04-18 DIAGNOSIS — N186 End stage renal disease: Secondary | ICD-10-CM | POA: Diagnosis not present

## 2019-04-29 DIAGNOSIS — Z79899 Other long term (current) drug therapy: Secondary | ICD-10-CM | POA: Diagnosis not present

## 2019-04-29 DIAGNOSIS — N186 End stage renal disease: Secondary | ICD-10-CM | POA: Diagnosis not present

## 2019-04-29 DIAGNOSIS — D631 Anemia in chronic kidney disease: Secondary | ICD-10-CM | POA: Diagnosis not present

## 2019-04-29 DIAGNOSIS — Z23 Encounter for immunization: Secondary | ICD-10-CM | POA: Diagnosis not present

## 2019-04-29 DIAGNOSIS — D509 Iron deficiency anemia, unspecified: Secondary | ICD-10-CM | POA: Diagnosis not present

## 2019-04-29 DIAGNOSIS — Z992 Dependence on renal dialysis: Secondary | ICD-10-CM | POA: Diagnosis not present

## 2019-05-02 DIAGNOSIS — Z992 Dependence on renal dialysis: Secondary | ICD-10-CM | POA: Diagnosis not present

## 2019-05-02 DIAGNOSIS — D631 Anemia in chronic kidney disease: Secondary | ICD-10-CM | POA: Diagnosis not present

## 2019-05-02 DIAGNOSIS — D509 Iron deficiency anemia, unspecified: Secondary | ICD-10-CM | POA: Diagnosis not present

## 2019-05-02 DIAGNOSIS — N186 End stage renal disease: Secondary | ICD-10-CM | POA: Diagnosis not present

## 2019-05-02 DIAGNOSIS — Z23 Encounter for immunization: Secondary | ICD-10-CM | POA: Diagnosis not present

## 2019-05-04 DIAGNOSIS — Z992 Dependence on renal dialysis: Secondary | ICD-10-CM | POA: Diagnosis not present

## 2019-05-04 DIAGNOSIS — D509 Iron deficiency anemia, unspecified: Secondary | ICD-10-CM | POA: Diagnosis not present

## 2019-05-04 DIAGNOSIS — D631 Anemia in chronic kidney disease: Secondary | ICD-10-CM | POA: Diagnosis not present

## 2019-05-04 DIAGNOSIS — Z23 Encounter for immunization: Secondary | ICD-10-CM | POA: Diagnosis not present

## 2019-05-04 DIAGNOSIS — N186 End stage renal disease: Secondary | ICD-10-CM | POA: Diagnosis not present

## 2019-05-06 DIAGNOSIS — D509 Iron deficiency anemia, unspecified: Secondary | ICD-10-CM | POA: Diagnosis not present

## 2019-05-06 DIAGNOSIS — Z23 Encounter for immunization: Secondary | ICD-10-CM | POA: Diagnosis not present

## 2019-05-06 DIAGNOSIS — N186 End stage renal disease: Secondary | ICD-10-CM | POA: Diagnosis not present

## 2019-05-06 DIAGNOSIS — D631 Anemia in chronic kidney disease: Secondary | ICD-10-CM | POA: Diagnosis not present

## 2019-05-06 DIAGNOSIS — Z992 Dependence on renal dialysis: Secondary | ICD-10-CM | POA: Diagnosis not present

## 2019-05-09 DIAGNOSIS — D509 Iron deficiency anemia, unspecified: Secondary | ICD-10-CM | POA: Diagnosis not present

## 2019-05-09 DIAGNOSIS — D631 Anemia in chronic kidney disease: Secondary | ICD-10-CM | POA: Diagnosis not present

## 2019-05-09 DIAGNOSIS — Z23 Encounter for immunization: Secondary | ICD-10-CM | POA: Diagnosis not present

## 2019-05-09 DIAGNOSIS — N186 End stage renal disease: Secondary | ICD-10-CM | POA: Diagnosis not present

## 2019-05-09 DIAGNOSIS — Z992 Dependence on renal dialysis: Secondary | ICD-10-CM | POA: Diagnosis not present

## 2019-05-11 DIAGNOSIS — D631 Anemia in chronic kidney disease: Secondary | ICD-10-CM | POA: Diagnosis not present

## 2019-05-11 DIAGNOSIS — Z992 Dependence on renal dialysis: Secondary | ICD-10-CM | POA: Diagnosis not present

## 2019-05-11 DIAGNOSIS — D509 Iron deficiency anemia, unspecified: Secondary | ICD-10-CM | POA: Diagnosis not present

## 2019-05-11 DIAGNOSIS — Z23 Encounter for immunization: Secondary | ICD-10-CM | POA: Diagnosis not present

## 2019-05-11 DIAGNOSIS — N186 End stage renal disease: Secondary | ICD-10-CM | POA: Diagnosis not present

## 2019-05-13 DIAGNOSIS — D631 Anemia in chronic kidney disease: Secondary | ICD-10-CM | POA: Diagnosis not present

## 2019-05-13 DIAGNOSIS — D509 Iron deficiency anemia, unspecified: Secondary | ICD-10-CM | POA: Diagnosis not present

## 2019-05-13 DIAGNOSIS — Z992 Dependence on renal dialysis: Secondary | ICD-10-CM | POA: Diagnosis not present

## 2019-05-13 DIAGNOSIS — Z23 Encounter for immunization: Secondary | ICD-10-CM | POA: Diagnosis not present

## 2019-05-13 DIAGNOSIS — N186 End stage renal disease: Secondary | ICD-10-CM | POA: Diagnosis not present

## 2019-05-16 DIAGNOSIS — D509 Iron deficiency anemia, unspecified: Secondary | ICD-10-CM | POA: Diagnosis not present

## 2019-05-16 DIAGNOSIS — Z23 Encounter for immunization: Secondary | ICD-10-CM | POA: Diagnosis not present

## 2019-05-16 DIAGNOSIS — N186 End stage renal disease: Secondary | ICD-10-CM | POA: Diagnosis not present

## 2019-05-16 DIAGNOSIS — D631 Anemia in chronic kidney disease: Secondary | ICD-10-CM | POA: Diagnosis not present

## 2019-05-16 DIAGNOSIS — Z992 Dependence on renal dialysis: Secondary | ICD-10-CM | POA: Diagnosis not present

## 2019-05-18 DIAGNOSIS — Z23 Encounter for immunization: Secondary | ICD-10-CM | POA: Diagnosis not present

## 2019-05-18 DIAGNOSIS — D631 Anemia in chronic kidney disease: Secondary | ICD-10-CM | POA: Diagnosis not present

## 2019-05-18 DIAGNOSIS — N2581 Secondary hyperparathyroidism of renal origin: Secondary | ICD-10-CM | POA: Diagnosis not present

## 2019-05-18 DIAGNOSIS — Z992 Dependence on renal dialysis: Secondary | ICD-10-CM | POA: Diagnosis not present

## 2019-05-18 DIAGNOSIS — D509 Iron deficiency anemia, unspecified: Secondary | ICD-10-CM | POA: Diagnosis not present

## 2019-05-18 DIAGNOSIS — R1110 Vomiting, unspecified: Secondary | ICD-10-CM | POA: Diagnosis not present

## 2019-05-18 DIAGNOSIS — N186 End stage renal disease: Secondary | ICD-10-CM | POA: Diagnosis not present

## 2019-05-20 DIAGNOSIS — D509 Iron deficiency anemia, unspecified: Secondary | ICD-10-CM | POA: Diagnosis not present

## 2019-05-20 DIAGNOSIS — Z23 Encounter for immunization: Secondary | ICD-10-CM | POA: Diagnosis not present

## 2019-05-20 DIAGNOSIS — Z992 Dependence on renal dialysis: Secondary | ICD-10-CM | POA: Diagnosis not present

## 2019-05-20 DIAGNOSIS — N186 End stage renal disease: Secondary | ICD-10-CM | POA: Diagnosis not present

## 2019-05-20 DIAGNOSIS — N2581 Secondary hyperparathyroidism of renal origin: Secondary | ICD-10-CM | POA: Diagnosis not present

## 2019-05-20 DIAGNOSIS — R1110 Vomiting, unspecified: Secondary | ICD-10-CM | POA: Diagnosis not present

## 2019-05-23 DIAGNOSIS — N2581 Secondary hyperparathyroidism of renal origin: Secondary | ICD-10-CM | POA: Diagnosis not present

## 2019-05-23 DIAGNOSIS — Z23 Encounter for immunization: Secondary | ICD-10-CM | POA: Diagnosis not present

## 2019-05-23 DIAGNOSIS — D509 Iron deficiency anemia, unspecified: Secondary | ICD-10-CM | POA: Diagnosis not present

## 2019-05-23 DIAGNOSIS — N186 End stage renal disease: Secondary | ICD-10-CM | POA: Diagnosis not present

## 2019-05-23 DIAGNOSIS — Z992 Dependence on renal dialysis: Secondary | ICD-10-CM | POA: Diagnosis not present

## 2019-05-23 DIAGNOSIS — R1110 Vomiting, unspecified: Secondary | ICD-10-CM | POA: Diagnosis not present

## 2019-05-25 DIAGNOSIS — R1110 Vomiting, unspecified: Secondary | ICD-10-CM | POA: Diagnosis not present

## 2019-05-25 DIAGNOSIS — N2581 Secondary hyperparathyroidism of renal origin: Secondary | ICD-10-CM | POA: Diagnosis not present

## 2019-05-25 DIAGNOSIS — Z23 Encounter for immunization: Secondary | ICD-10-CM | POA: Diagnosis not present

## 2019-05-25 DIAGNOSIS — Z992 Dependence on renal dialysis: Secondary | ICD-10-CM | POA: Diagnosis not present

## 2019-05-25 DIAGNOSIS — D509 Iron deficiency anemia, unspecified: Secondary | ICD-10-CM | POA: Diagnosis not present

## 2019-05-25 DIAGNOSIS — N186 End stage renal disease: Secondary | ICD-10-CM | POA: Diagnosis not present

## 2019-05-27 DIAGNOSIS — Z992 Dependence on renal dialysis: Secondary | ICD-10-CM | POA: Diagnosis not present

## 2019-05-27 DIAGNOSIS — Z23 Encounter for immunization: Secondary | ICD-10-CM | POA: Diagnosis not present

## 2019-05-27 DIAGNOSIS — N2581 Secondary hyperparathyroidism of renal origin: Secondary | ICD-10-CM | POA: Diagnosis not present

## 2019-05-27 DIAGNOSIS — D509 Iron deficiency anemia, unspecified: Secondary | ICD-10-CM | POA: Diagnosis not present

## 2019-05-27 DIAGNOSIS — R1110 Vomiting, unspecified: Secondary | ICD-10-CM | POA: Diagnosis not present

## 2019-05-27 DIAGNOSIS — N186 End stage renal disease: Secondary | ICD-10-CM | POA: Diagnosis not present

## 2019-05-30 DIAGNOSIS — Z992 Dependence on renal dialysis: Secondary | ICD-10-CM | POA: Diagnosis not present

## 2019-05-30 DIAGNOSIS — Z23 Encounter for immunization: Secondary | ICD-10-CM | POA: Diagnosis not present

## 2019-05-30 DIAGNOSIS — R1110 Vomiting, unspecified: Secondary | ICD-10-CM | POA: Diagnosis not present

## 2019-05-30 DIAGNOSIS — D509 Iron deficiency anemia, unspecified: Secondary | ICD-10-CM | POA: Diagnosis not present

## 2019-05-30 DIAGNOSIS — N2581 Secondary hyperparathyroidism of renal origin: Secondary | ICD-10-CM | POA: Diagnosis not present

## 2019-05-30 DIAGNOSIS — N186 End stage renal disease: Secondary | ICD-10-CM | POA: Diagnosis not present

## 2019-06-01 DIAGNOSIS — R1110 Vomiting, unspecified: Secondary | ICD-10-CM | POA: Diagnosis not present

## 2019-06-01 DIAGNOSIS — Z23 Encounter for immunization: Secondary | ICD-10-CM | POA: Diagnosis not present

## 2019-06-01 DIAGNOSIS — N186 End stage renal disease: Secondary | ICD-10-CM | POA: Diagnosis not present

## 2019-06-01 DIAGNOSIS — Z992 Dependence on renal dialysis: Secondary | ICD-10-CM | POA: Diagnosis not present

## 2019-06-01 DIAGNOSIS — N2581 Secondary hyperparathyroidism of renal origin: Secondary | ICD-10-CM | POA: Diagnosis not present

## 2019-06-01 DIAGNOSIS — D509 Iron deficiency anemia, unspecified: Secondary | ICD-10-CM | POA: Diagnosis not present

## 2019-06-03 DIAGNOSIS — D509 Iron deficiency anemia, unspecified: Secondary | ICD-10-CM | POA: Diagnosis not present

## 2019-06-03 DIAGNOSIS — N186 End stage renal disease: Secondary | ICD-10-CM | POA: Diagnosis not present

## 2019-06-03 DIAGNOSIS — N2581 Secondary hyperparathyroidism of renal origin: Secondary | ICD-10-CM | POA: Diagnosis not present

## 2019-06-03 DIAGNOSIS — R1110 Vomiting, unspecified: Secondary | ICD-10-CM | POA: Diagnosis not present

## 2019-06-03 DIAGNOSIS — Z992 Dependence on renal dialysis: Secondary | ICD-10-CM | POA: Diagnosis not present

## 2019-06-03 DIAGNOSIS — Z23 Encounter for immunization: Secondary | ICD-10-CM | POA: Diagnosis not present

## 2019-06-06 DIAGNOSIS — Z992 Dependence on renal dialysis: Secondary | ICD-10-CM | POA: Diagnosis not present

## 2019-06-06 DIAGNOSIS — N186 End stage renal disease: Secondary | ICD-10-CM | POA: Diagnosis not present

## 2019-06-06 DIAGNOSIS — N2581 Secondary hyperparathyroidism of renal origin: Secondary | ICD-10-CM | POA: Diagnosis not present

## 2019-06-06 DIAGNOSIS — R1110 Vomiting, unspecified: Secondary | ICD-10-CM | POA: Diagnosis not present

## 2019-06-06 DIAGNOSIS — Z23 Encounter for immunization: Secondary | ICD-10-CM | POA: Diagnosis not present

## 2019-06-06 DIAGNOSIS — D509 Iron deficiency anemia, unspecified: Secondary | ICD-10-CM | POA: Diagnosis not present

## 2019-06-08 DIAGNOSIS — N2581 Secondary hyperparathyroidism of renal origin: Secondary | ICD-10-CM | POA: Diagnosis not present

## 2019-06-08 DIAGNOSIS — N186 End stage renal disease: Secondary | ICD-10-CM | POA: Diagnosis not present

## 2019-06-08 DIAGNOSIS — Z992 Dependence on renal dialysis: Secondary | ICD-10-CM | POA: Diagnosis not present

## 2019-06-08 DIAGNOSIS — R1110 Vomiting, unspecified: Secondary | ICD-10-CM | POA: Diagnosis not present

## 2019-06-08 DIAGNOSIS — D509 Iron deficiency anemia, unspecified: Secondary | ICD-10-CM | POA: Diagnosis not present

## 2019-06-08 DIAGNOSIS — Z23 Encounter for immunization: Secondary | ICD-10-CM | POA: Diagnosis not present

## 2019-06-10 DIAGNOSIS — N2581 Secondary hyperparathyroidism of renal origin: Secondary | ICD-10-CM | POA: Diagnosis not present

## 2019-06-10 DIAGNOSIS — Z992 Dependence on renal dialysis: Secondary | ICD-10-CM | POA: Diagnosis not present

## 2019-06-10 DIAGNOSIS — R1110 Vomiting, unspecified: Secondary | ICD-10-CM | POA: Diagnosis not present

## 2019-06-10 DIAGNOSIS — N186 End stage renal disease: Secondary | ICD-10-CM | POA: Diagnosis not present

## 2019-06-10 DIAGNOSIS — Z23 Encounter for immunization: Secondary | ICD-10-CM | POA: Diagnosis not present

## 2019-06-10 DIAGNOSIS — D509 Iron deficiency anemia, unspecified: Secondary | ICD-10-CM | POA: Diagnosis not present

## 2019-06-13 DIAGNOSIS — Z992 Dependence on renal dialysis: Secondary | ICD-10-CM | POA: Diagnosis not present

## 2019-06-13 DIAGNOSIS — Z23 Encounter for immunization: Secondary | ICD-10-CM | POA: Diagnosis not present

## 2019-06-13 DIAGNOSIS — D509 Iron deficiency anemia, unspecified: Secondary | ICD-10-CM | POA: Diagnosis not present

## 2019-06-13 DIAGNOSIS — R1110 Vomiting, unspecified: Secondary | ICD-10-CM | POA: Diagnosis not present

## 2019-06-13 DIAGNOSIS — N2581 Secondary hyperparathyroidism of renal origin: Secondary | ICD-10-CM | POA: Diagnosis not present

## 2019-06-13 DIAGNOSIS — N186 End stage renal disease: Secondary | ICD-10-CM | POA: Diagnosis not present

## 2019-06-15 DIAGNOSIS — Z23 Encounter for immunization: Secondary | ICD-10-CM | POA: Diagnosis not present

## 2019-06-15 DIAGNOSIS — R1110 Vomiting, unspecified: Secondary | ICD-10-CM | POA: Diagnosis not present

## 2019-06-15 DIAGNOSIS — D509 Iron deficiency anemia, unspecified: Secondary | ICD-10-CM | POA: Diagnosis not present

## 2019-06-15 DIAGNOSIS — Z992 Dependence on renal dialysis: Secondary | ICD-10-CM | POA: Diagnosis not present

## 2019-06-15 DIAGNOSIS — N2581 Secondary hyperparathyroidism of renal origin: Secondary | ICD-10-CM | POA: Diagnosis not present

## 2019-06-15 DIAGNOSIS — N186 End stage renal disease: Secondary | ICD-10-CM | POA: Diagnosis not present

## 2019-06-17 DIAGNOSIS — Z992 Dependence on renal dialysis: Secondary | ICD-10-CM | POA: Diagnosis not present

## 2019-06-17 DIAGNOSIS — N2581 Secondary hyperparathyroidism of renal origin: Secondary | ICD-10-CM | POA: Diagnosis not present

## 2019-06-17 DIAGNOSIS — D509 Iron deficiency anemia, unspecified: Secondary | ICD-10-CM | POA: Diagnosis not present

## 2019-06-17 DIAGNOSIS — N186 End stage renal disease: Secondary | ICD-10-CM | POA: Diagnosis not present

## 2019-06-17 DIAGNOSIS — R1110 Vomiting, unspecified: Secondary | ICD-10-CM | POA: Diagnosis not present

## 2019-06-17 DIAGNOSIS — Z23 Encounter for immunization: Secondary | ICD-10-CM | POA: Diagnosis not present

## 2019-06-18 DIAGNOSIS — Z992 Dependence on renal dialysis: Secondary | ICD-10-CM | POA: Diagnosis not present

## 2019-06-18 DIAGNOSIS — N186 End stage renal disease: Secondary | ICD-10-CM | POA: Diagnosis not present

## 2019-06-19 DIAGNOSIS — D509 Iron deficiency anemia, unspecified: Secondary | ICD-10-CM | POA: Diagnosis not present

## 2019-06-19 DIAGNOSIS — Z992 Dependence on renal dialysis: Secondary | ICD-10-CM | POA: Diagnosis not present

## 2019-06-19 DIAGNOSIS — N186 End stage renal disease: Secondary | ICD-10-CM | POA: Diagnosis not present

## 2019-06-19 DIAGNOSIS — D631 Anemia in chronic kidney disease: Secondary | ICD-10-CM | POA: Diagnosis not present

## 2019-06-22 DIAGNOSIS — Z992 Dependence on renal dialysis: Secondary | ICD-10-CM | POA: Diagnosis not present

## 2019-06-22 DIAGNOSIS — D509 Iron deficiency anemia, unspecified: Secondary | ICD-10-CM | POA: Diagnosis not present

## 2019-06-22 DIAGNOSIS — N186 End stage renal disease: Secondary | ICD-10-CM | POA: Diagnosis not present

## 2019-06-22 DIAGNOSIS — D631 Anemia in chronic kidney disease: Secondary | ICD-10-CM | POA: Diagnosis not present

## 2019-06-24 DIAGNOSIS — D631 Anemia in chronic kidney disease: Secondary | ICD-10-CM | POA: Diagnosis not present

## 2019-06-24 DIAGNOSIS — Z992 Dependence on renal dialysis: Secondary | ICD-10-CM | POA: Diagnosis not present

## 2019-06-24 DIAGNOSIS — N186 End stage renal disease: Secondary | ICD-10-CM | POA: Diagnosis not present

## 2019-06-24 DIAGNOSIS — D509 Iron deficiency anemia, unspecified: Secondary | ICD-10-CM | POA: Diagnosis not present

## 2019-06-27 DIAGNOSIS — D509 Iron deficiency anemia, unspecified: Secondary | ICD-10-CM | POA: Diagnosis not present

## 2019-06-27 DIAGNOSIS — N186 End stage renal disease: Secondary | ICD-10-CM | POA: Diagnosis not present

## 2019-06-27 DIAGNOSIS — D631 Anemia in chronic kidney disease: Secondary | ICD-10-CM | POA: Diagnosis not present

## 2019-06-27 DIAGNOSIS — Z992 Dependence on renal dialysis: Secondary | ICD-10-CM | POA: Diagnosis not present

## 2019-06-29 DIAGNOSIS — D631 Anemia in chronic kidney disease: Secondary | ICD-10-CM | POA: Diagnosis not present

## 2019-06-29 DIAGNOSIS — D509 Iron deficiency anemia, unspecified: Secondary | ICD-10-CM | POA: Diagnosis not present

## 2019-06-29 DIAGNOSIS — Z992 Dependence on renal dialysis: Secondary | ICD-10-CM | POA: Diagnosis not present

## 2019-06-29 DIAGNOSIS — N186 End stage renal disease: Secondary | ICD-10-CM | POA: Diagnosis not present

## 2019-07-01 DIAGNOSIS — N186 End stage renal disease: Secondary | ICD-10-CM | POA: Diagnosis not present

## 2019-07-01 DIAGNOSIS — Z992 Dependence on renal dialysis: Secondary | ICD-10-CM | POA: Diagnosis not present

## 2019-07-01 DIAGNOSIS — D631 Anemia in chronic kidney disease: Secondary | ICD-10-CM | POA: Diagnosis not present

## 2019-07-01 DIAGNOSIS — D509 Iron deficiency anemia, unspecified: Secondary | ICD-10-CM | POA: Diagnosis not present

## 2019-07-04 DIAGNOSIS — N186 End stage renal disease: Secondary | ICD-10-CM | POA: Diagnosis not present

## 2019-07-04 DIAGNOSIS — Z992 Dependence on renal dialysis: Secondary | ICD-10-CM | POA: Diagnosis not present

## 2019-07-04 DIAGNOSIS — D631 Anemia in chronic kidney disease: Secondary | ICD-10-CM | POA: Diagnosis not present

## 2019-07-04 DIAGNOSIS — D509 Iron deficiency anemia, unspecified: Secondary | ICD-10-CM | POA: Diagnosis not present

## 2019-07-06 DIAGNOSIS — D509 Iron deficiency anemia, unspecified: Secondary | ICD-10-CM | POA: Diagnosis not present

## 2019-07-06 DIAGNOSIS — D631 Anemia in chronic kidney disease: Secondary | ICD-10-CM | POA: Diagnosis not present

## 2019-07-06 DIAGNOSIS — Z992 Dependence on renal dialysis: Secondary | ICD-10-CM | POA: Diagnosis not present

## 2019-07-06 DIAGNOSIS — N186 End stage renal disease: Secondary | ICD-10-CM | POA: Diagnosis not present

## 2019-07-08 DIAGNOSIS — N186 End stage renal disease: Secondary | ICD-10-CM | POA: Diagnosis not present

## 2019-07-08 DIAGNOSIS — D509 Iron deficiency anemia, unspecified: Secondary | ICD-10-CM | POA: Diagnosis not present

## 2019-07-08 DIAGNOSIS — D631 Anemia in chronic kidney disease: Secondary | ICD-10-CM | POA: Diagnosis not present

## 2019-07-08 DIAGNOSIS — Z992 Dependence on renal dialysis: Secondary | ICD-10-CM | POA: Diagnosis not present

## 2019-07-15 DIAGNOSIS — D631 Anemia in chronic kidney disease: Secondary | ICD-10-CM | POA: Diagnosis not present

## 2019-07-15 DIAGNOSIS — N186 End stage renal disease: Secondary | ICD-10-CM | POA: Diagnosis not present

## 2019-07-15 DIAGNOSIS — D509 Iron deficiency anemia, unspecified: Secondary | ICD-10-CM | POA: Diagnosis not present

## 2019-07-15 DIAGNOSIS — Z992 Dependence on renal dialysis: Secondary | ICD-10-CM | POA: Diagnosis not present

## 2019-07-19 DIAGNOSIS — D631 Anemia in chronic kidney disease: Secondary | ICD-10-CM | POA: Diagnosis not present

## 2019-07-19 DIAGNOSIS — D509 Iron deficiency anemia, unspecified: Secondary | ICD-10-CM | POA: Diagnosis not present

## 2019-07-19 DIAGNOSIS — Z992 Dependence on renal dialysis: Secondary | ICD-10-CM | POA: Diagnosis not present

## 2019-07-19 DIAGNOSIS — N186 End stage renal disease: Secondary | ICD-10-CM | POA: Diagnosis not present

## 2019-07-20 DIAGNOSIS — D631 Anemia in chronic kidney disease: Secondary | ICD-10-CM | POA: Diagnosis not present

## 2019-07-20 DIAGNOSIS — Z992 Dependence on renal dialysis: Secondary | ICD-10-CM | POA: Diagnosis not present

## 2019-07-20 DIAGNOSIS — N186 End stage renal disease: Secondary | ICD-10-CM | POA: Diagnosis not present

## 2019-07-20 DIAGNOSIS — D509 Iron deficiency anemia, unspecified: Secondary | ICD-10-CM | POA: Diagnosis not present

## 2019-07-23 DIAGNOSIS — Z992 Dependence on renal dialysis: Secondary | ICD-10-CM | POA: Diagnosis not present

## 2019-07-23 DIAGNOSIS — A419 Sepsis, unspecified organism: Secondary | ICD-10-CM | POA: Diagnosis not present

## 2019-07-23 DIAGNOSIS — J189 Pneumonia, unspecified organism: Secondary | ICD-10-CM | POA: Diagnosis not present

## 2019-07-23 DIAGNOSIS — Z79899 Other long term (current) drug therapy: Secondary | ICD-10-CM | POA: Diagnosis not present

## 2019-07-23 DIAGNOSIS — B20 Human immunodeficiency virus [HIV] disease: Secondary | ICD-10-CM | POA: Diagnosis not present

## 2019-07-23 DIAGNOSIS — J181 Lobar pneumonia, unspecified organism: Secondary | ICD-10-CM | POA: Diagnosis not present

## 2019-07-23 DIAGNOSIS — R0789 Other chest pain: Secondary | ICD-10-CM | POA: Diagnosis not present

## 2019-07-23 DIAGNOSIS — Z87891 Personal history of nicotine dependence: Secondary | ICD-10-CM | POA: Diagnosis not present

## 2019-07-23 DIAGNOSIS — Z5321 Procedure and treatment not carried out due to patient leaving prior to being seen by health care provider: Secondary | ICD-10-CM | POA: Diagnosis not present

## 2019-07-23 DIAGNOSIS — Z7982 Long term (current) use of aspirin: Secondary | ICD-10-CM | POA: Diagnosis not present

## 2019-07-23 DIAGNOSIS — N186 End stage renal disease: Secondary | ICD-10-CM | POA: Diagnosis not present

## 2019-07-23 DIAGNOSIS — R0602 Shortness of breath: Secondary | ICD-10-CM | POA: Diagnosis not present

## 2019-07-23 DIAGNOSIS — Z955 Presence of coronary angioplasty implant and graft: Secondary | ICD-10-CM | POA: Diagnosis not present

## 2019-07-23 DIAGNOSIS — I2510 Atherosclerotic heart disease of native coronary artery without angina pectoris: Secondary | ICD-10-CM | POA: Diagnosis not present

## 2019-07-23 DIAGNOSIS — I120 Hypertensive chronic kidney disease with stage 5 chronic kidney disease or end stage renal disease: Secondary | ICD-10-CM | POA: Diagnosis not present

## 2019-07-24 DIAGNOSIS — I313 Pericardial effusion (noninflammatory): Secondary | ICD-10-CM | POA: Diagnosis not present

## 2019-07-24 DIAGNOSIS — R0789 Other chest pain: Secondary | ICD-10-CM | POA: Diagnosis not present

## 2019-07-24 DIAGNOSIS — J189 Pneumonia, unspecified organism: Secondary | ICD-10-CM | POA: Diagnosis not present

## 2019-07-24 DIAGNOSIS — Z992 Dependence on renal dialysis: Secondary | ICD-10-CM | POA: Diagnosis not present

## 2019-07-24 DIAGNOSIS — R918 Other nonspecific abnormal finding of lung field: Secondary | ICD-10-CM | POA: Diagnosis not present

## 2019-07-24 DIAGNOSIS — R0602 Shortness of breath: Secondary | ICD-10-CM | POA: Diagnosis not present

## 2019-07-24 DIAGNOSIS — I161 Hypertensive emergency: Secondary | ICD-10-CM | POA: Diagnosis not present

## 2019-07-24 DIAGNOSIS — B20 Human immunodeficiency virus [HIV] disease: Secondary | ICD-10-CM | POA: Diagnosis not present

## 2019-07-24 DIAGNOSIS — I2510 Atherosclerotic heart disease of native coronary artery without angina pectoris: Secondary | ICD-10-CM | POA: Diagnosis not present

## 2019-07-24 DIAGNOSIS — N186 End stage renal disease: Secondary | ICD-10-CM | POA: Diagnosis not present

## 2019-07-24 DIAGNOSIS — J984 Other disorders of lung: Secondary | ICD-10-CM | POA: Diagnosis not present

## 2019-07-24 DIAGNOSIS — Z8679 Personal history of other diseases of the circulatory system: Secondary | ICD-10-CM | POA: Diagnosis not present

## 2019-07-24 DIAGNOSIS — I5022 Chronic systolic (congestive) heart failure: Secondary | ICD-10-CM | POA: Diagnosis not present

## 2019-07-24 DIAGNOSIS — I132 Hypertensive heart and chronic kidney disease with heart failure and with stage 5 chronic kidney disease, or end stage renal disease: Secondary | ICD-10-CM | POA: Diagnosis not present

## 2019-07-24 DIAGNOSIS — R1084 Generalized abdominal pain: Secondary | ICD-10-CM | POA: Diagnosis not present

## 2019-07-24 DIAGNOSIS — I7102 Dissection of abdominal aorta: Secondary | ICD-10-CM | POA: Diagnosis not present

## 2019-07-25 DIAGNOSIS — Z9119 Patient's noncompliance with other medical treatment and regimen: Secondary | ICD-10-CM | POA: Diagnosis not present

## 2019-07-25 DIAGNOSIS — R079 Chest pain, unspecified: Secondary | ICD-10-CM | POA: Diagnosis not present

## 2019-07-25 DIAGNOSIS — I5020 Unspecified systolic (congestive) heart failure: Secondary | ICD-10-CM | POA: Diagnosis not present

## 2019-07-25 DIAGNOSIS — Z9114 Patient's other noncompliance with medication regimen: Secondary | ICD-10-CM | POA: Diagnosis not present

## 2019-07-25 DIAGNOSIS — N250 Renal osteodystrophy: Secondary | ICD-10-CM | POA: Diagnosis present

## 2019-07-25 DIAGNOSIS — I132 Hypertensive heart and chronic kidney disease with heart failure and with stage 5 chronic kidney disease, or end stage renal disease: Secondary | ICD-10-CM | POA: Diagnosis present

## 2019-07-25 DIAGNOSIS — B20 Human immunodeficiency virus [HIV] disease: Secondary | ICD-10-CM | POA: Diagnosis not present

## 2019-07-25 DIAGNOSIS — Z8679 Personal history of other diseases of the circulatory system: Secondary | ICD-10-CM | POA: Diagnosis not present

## 2019-07-25 DIAGNOSIS — E785 Hyperlipidemia, unspecified: Secondary | ICD-10-CM | POA: Diagnosis present

## 2019-07-25 DIAGNOSIS — N186 End stage renal disease: Secondary | ICD-10-CM | POA: Diagnosis not present

## 2019-07-25 DIAGNOSIS — N19 Unspecified kidney failure: Secondary | ICD-10-CM | POA: Diagnosis not present

## 2019-07-25 DIAGNOSIS — R7989 Other specified abnormal findings of blood chemistry: Secondary | ICD-10-CM | POA: Diagnosis not present

## 2019-07-25 DIAGNOSIS — I5022 Chronic systolic (congestive) heart failure: Secondary | ICD-10-CM | POA: Diagnosis present

## 2019-07-25 DIAGNOSIS — I161 Hypertensive emergency: Secondary | ICD-10-CM | POA: Diagnosis not present

## 2019-07-25 DIAGNOSIS — Z7902 Long term (current) use of antithrombotics/antiplatelets: Secondary | ICD-10-CM | POA: Diagnosis not present

## 2019-07-25 DIAGNOSIS — Z7982 Long term (current) use of aspirin: Secondary | ICD-10-CM | POA: Diagnosis not present

## 2019-07-25 DIAGNOSIS — F419 Anxiety disorder, unspecified: Secondary | ICD-10-CM | POA: Diagnosis present

## 2019-07-25 DIAGNOSIS — Z87892 Personal history of anaphylaxis: Secondary | ICD-10-CM | POA: Diagnosis not present

## 2019-07-25 DIAGNOSIS — D649 Anemia, unspecified: Secondary | ICD-10-CM | POA: Diagnosis present

## 2019-07-25 DIAGNOSIS — Z992 Dependence on renal dialysis: Secondary | ICD-10-CM | POA: Diagnosis not present

## 2019-07-25 DIAGNOSIS — I252 Old myocardial infarction: Secondary | ICD-10-CM | POA: Diagnosis not present

## 2019-07-25 DIAGNOSIS — I313 Pericardial effusion (noninflammatory): Secondary | ICD-10-CM | POA: Diagnosis present

## 2019-07-25 DIAGNOSIS — I10 Essential (primary) hypertension: Secondary | ICD-10-CM | POA: Diagnosis not present

## 2019-07-25 DIAGNOSIS — Z20828 Contact with and (suspected) exposure to other viral communicable diseases: Secondary | ICD-10-CM | POA: Diagnosis present

## 2019-07-25 DIAGNOSIS — Z87891 Personal history of nicotine dependence: Secondary | ICD-10-CM | POA: Diagnosis not present

## 2019-07-25 DIAGNOSIS — I255 Ischemic cardiomyopathy: Secondary | ICD-10-CM | POA: Diagnosis present

## 2019-07-25 DIAGNOSIS — R918 Other nonspecific abnormal finding of lung field: Secondary | ICD-10-CM | POA: Diagnosis not present

## 2019-07-25 DIAGNOSIS — I2510 Atherosclerotic heart disease of native coronary artery without angina pectoris: Secondary | ICD-10-CM | POA: Diagnosis not present

## 2019-07-25 DIAGNOSIS — Z8701 Personal history of pneumonia (recurrent): Secondary | ICD-10-CM | POA: Diagnosis not present

## 2019-07-25 DIAGNOSIS — Z9861 Coronary angioplasty status: Secondary | ICD-10-CM | POA: Diagnosis not present

## 2019-07-25 DIAGNOSIS — J189 Pneumonia, unspecified organism: Secondary | ICD-10-CM | POA: Diagnosis not present

## 2019-07-25 DIAGNOSIS — Z880 Allergy status to penicillin: Secondary | ICD-10-CM | POA: Diagnosis not present

## 2019-07-25 DIAGNOSIS — J984 Other disorders of lung: Secondary | ICD-10-CM | POA: Diagnosis not present

## 2019-07-25 DIAGNOSIS — R1084 Generalized abdominal pain: Secondary | ICD-10-CM | POA: Diagnosis not present

## 2019-07-25 DIAGNOSIS — I120 Hypertensive chronic kidney disease with stage 5 chronic kidney disease or end stage renal disease: Secondary | ICD-10-CM | POA: Diagnosis not present

## 2019-07-25 DIAGNOSIS — R0602 Shortness of breath: Secondary | ICD-10-CM | POA: Diagnosis not present

## 2019-07-25 DIAGNOSIS — I7102 Dissection of abdominal aorta: Secondary | ICD-10-CM | POA: Diagnosis not present

## 2019-07-26 MED ORDER — SECURA PERSONAL SKIN CARE 0.13 & 98.789 % EX KIT
1.00 | PACK | CUTANEOUS | Status: DC
Start: 2019-07-26 — End: 2019-07-26

## 2019-07-26 MED ORDER — NITROGLYCERIN IN D5W 400-5 MCG/ML-% IV SOLN
25.00 | INTRAVENOUS | Status: DC
Start: ? — End: 2019-07-26

## 2019-07-26 MED ORDER — MORPHINE SULFATE ER 15 MG PO TBCR
1100.00 | EXTENDED_RELEASE_TABLET | ORAL | Status: DC
Start: ? — End: 2019-07-26

## 2019-07-26 MED ORDER — Medication
1.00 | Status: DC
Start: 2019-07-26 — End: 2019-07-26

## 2019-07-26 MED ORDER — SUBDUE PLUS PO LIQD
75.00 | ORAL | Status: DC
Start: 2019-07-27 — End: 2019-07-26

## 2019-07-26 MED ORDER — CHLOROBUTANOL-EUGENOL & APAP
0.40 | Status: DC
Start: ? — End: 2019-07-26

## 2019-07-26 MED ORDER — Medication
Status: DC
Start: ? — End: 2019-07-26

## 2019-07-26 MED ORDER — PHENYLEPHRINE-GUAIFENESIN 30-400 MG PO CP12
10.00 | ORAL_CAPSULE | ORAL | Status: DC
Start: 2019-07-27 — End: 2019-07-26

## 2019-07-26 MED ORDER — OLANZAPINE-FLUOXETINE HCL 6-50 MG PO CAPS
6.00 | ORAL_CAPSULE | ORAL | Status: DC
Start: ? — End: 2019-07-26

## 2019-07-26 MED ORDER — DARK TANNING SPF4 EX
50.00 | CUTANEOUS | Status: DC
Start: 2019-07-26 — End: 2019-07-26

## 2019-07-26 MED ORDER — VICON FORTE PO CAPS
17.00 | ORAL_CAPSULE | ORAL | Status: DC
Start: 2019-07-27 — End: 2019-07-26

## 2019-07-26 MED ORDER — YALE REUSABLE NEEDLE 25G X 1" MISC
100.00 | Status: DC
Start: 2019-07-26 — End: 2019-07-26

## 2019-07-26 MED ORDER — LIDOCAINE HCL 200 MG/10ML IJ SOSY
2.50 | PREFILLED_SYRINGE | INTRAMUSCULAR | Status: DC
Start: ? — End: 2019-07-26

## 2019-07-26 MED ORDER — PHENYLEPH-POT GUAIACOLSULF
81.00 | Status: DC
Start: 2019-07-27 — End: 2019-07-26

## 2019-07-26 MED ORDER — Medication
250.00 | Status: DC
Start: 2019-07-27 — End: 2019-07-26

## 2019-07-26 MED ORDER — RICOLA HERB MT
80.00 | OROMUCOSAL | Status: DC
Start: ? — End: 2019-07-26

## 2019-07-26 MED ORDER — QUINERVA 260 MG PO TABS
650.00 | ORAL_TABLET | ORAL | Status: DC
Start: ? — End: 2019-07-26

## 2019-07-26 MED ORDER — Medication
2.00 | Status: DC
Start: ? — End: 2019-07-26

## 2019-07-26 MED ORDER — Medication
2001.00 | Status: DC
Start: 2019-07-26 — End: 2019-07-26

## 2019-07-26 MED ORDER — LIDOCAINE HCL 1 % IJ SOLN
0.50 | INTRAMUSCULAR | Status: DC
Start: ? — End: 2019-07-26

## 2019-07-26 MED ORDER — RA COLD/COUGH DM 15-1-5 MG/5ML PO ELIX
80.00 | ORAL_SOLUTION | ORAL | Status: DC
Start: 2019-07-27 — End: 2019-07-26

## 2019-07-26 MED ORDER — CATALYTIC FORMULA PO
50.00 | ORAL | Status: DC
Start: 2019-07-26 — End: 2019-07-26

## 2019-07-26 MED ORDER — PANATUSS 5-2-15-100 MG/5ML OR SYRP
40.00 | ORAL_SOLUTION | ORAL | Status: DC
Start: 2019-07-27 — End: 2019-07-26

## 2019-07-29 DIAGNOSIS — Z992 Dependence on renal dialysis: Secondary | ICD-10-CM | POA: Diagnosis not present

## 2019-07-29 DIAGNOSIS — D509 Iron deficiency anemia, unspecified: Secondary | ICD-10-CM | POA: Diagnosis not present

## 2019-07-29 DIAGNOSIS — D631 Anemia in chronic kidney disease: Secondary | ICD-10-CM | POA: Diagnosis not present

## 2019-07-29 DIAGNOSIS — N186 End stage renal disease: Secondary | ICD-10-CM | POA: Diagnosis not present

## 2019-08-01 DIAGNOSIS — D631 Anemia in chronic kidney disease: Secondary | ICD-10-CM | POA: Diagnosis not present

## 2019-08-01 DIAGNOSIS — Z992 Dependence on renal dialysis: Secondary | ICD-10-CM | POA: Diagnosis not present

## 2019-08-01 DIAGNOSIS — N186 End stage renal disease: Secondary | ICD-10-CM | POA: Diagnosis not present

## 2019-08-01 DIAGNOSIS — D509 Iron deficiency anemia, unspecified: Secondary | ICD-10-CM | POA: Diagnosis not present

## 2019-08-03 DIAGNOSIS — D509 Iron deficiency anemia, unspecified: Secondary | ICD-10-CM | POA: Diagnosis not present

## 2019-08-03 DIAGNOSIS — Z992 Dependence on renal dialysis: Secondary | ICD-10-CM | POA: Diagnosis not present

## 2019-08-03 DIAGNOSIS — D631 Anemia in chronic kidney disease: Secondary | ICD-10-CM | POA: Diagnosis not present

## 2019-08-03 DIAGNOSIS — N186 End stage renal disease: Secondary | ICD-10-CM | POA: Diagnosis not present

## 2019-08-05 DIAGNOSIS — D631 Anemia in chronic kidney disease: Secondary | ICD-10-CM | POA: Diagnosis not present

## 2019-08-05 DIAGNOSIS — Z992 Dependence on renal dialysis: Secondary | ICD-10-CM | POA: Diagnosis not present

## 2019-08-05 DIAGNOSIS — D509 Iron deficiency anemia, unspecified: Secondary | ICD-10-CM | POA: Diagnosis not present

## 2019-08-05 DIAGNOSIS — N186 End stage renal disease: Secondary | ICD-10-CM | POA: Diagnosis not present

## 2019-08-08 DIAGNOSIS — D509 Iron deficiency anemia, unspecified: Secondary | ICD-10-CM | POA: Diagnosis not present

## 2019-08-08 DIAGNOSIS — N186 End stage renal disease: Secondary | ICD-10-CM | POA: Diagnosis not present

## 2019-08-08 DIAGNOSIS — D631 Anemia in chronic kidney disease: Secondary | ICD-10-CM | POA: Diagnosis not present

## 2019-08-08 DIAGNOSIS — Z992 Dependence on renal dialysis: Secondary | ICD-10-CM | POA: Diagnosis not present

## 2019-08-10 DIAGNOSIS — D509 Iron deficiency anemia, unspecified: Secondary | ICD-10-CM | POA: Diagnosis not present

## 2019-08-10 DIAGNOSIS — D631 Anemia in chronic kidney disease: Secondary | ICD-10-CM | POA: Diagnosis not present

## 2019-08-10 DIAGNOSIS — Z992 Dependence on renal dialysis: Secondary | ICD-10-CM | POA: Diagnosis not present

## 2019-08-10 DIAGNOSIS — N186 End stage renal disease: Secondary | ICD-10-CM | POA: Diagnosis not present

## 2019-08-12 DIAGNOSIS — D631 Anemia in chronic kidney disease: Secondary | ICD-10-CM | POA: Diagnosis not present

## 2019-08-12 DIAGNOSIS — Z992 Dependence on renal dialysis: Secondary | ICD-10-CM | POA: Diagnosis not present

## 2019-08-12 DIAGNOSIS — N186 End stage renal disease: Secondary | ICD-10-CM | POA: Diagnosis not present

## 2019-08-12 DIAGNOSIS — D509 Iron deficiency anemia, unspecified: Secondary | ICD-10-CM | POA: Diagnosis not present

## 2019-08-15 DIAGNOSIS — D509 Iron deficiency anemia, unspecified: Secondary | ICD-10-CM | POA: Diagnosis not present

## 2019-08-15 DIAGNOSIS — D631 Anemia in chronic kidney disease: Secondary | ICD-10-CM | POA: Diagnosis not present

## 2019-08-15 DIAGNOSIS — N186 End stage renal disease: Secondary | ICD-10-CM | POA: Diagnosis not present

## 2019-08-15 DIAGNOSIS — Z992 Dependence on renal dialysis: Secondary | ICD-10-CM | POA: Diagnosis not present

## 2019-08-17 DIAGNOSIS — D631 Anemia in chronic kidney disease: Secondary | ICD-10-CM | POA: Diagnosis not present

## 2019-08-17 DIAGNOSIS — D509 Iron deficiency anemia, unspecified: Secondary | ICD-10-CM | POA: Diagnosis not present

## 2019-08-17 DIAGNOSIS — N186 End stage renal disease: Secondary | ICD-10-CM | POA: Diagnosis not present

## 2019-08-17 DIAGNOSIS — Z992 Dependence on renal dialysis: Secondary | ICD-10-CM | POA: Diagnosis not present

## 2019-08-18 DIAGNOSIS — Z992 Dependence on renal dialysis: Secondary | ICD-10-CM | POA: Diagnosis not present

## 2019-08-18 DIAGNOSIS — N186 End stage renal disease: Secondary | ICD-10-CM | POA: Diagnosis not present

## 2019-08-19 DIAGNOSIS — D509 Iron deficiency anemia, unspecified: Secondary | ICD-10-CM | POA: Diagnosis not present

## 2019-08-19 DIAGNOSIS — N186 End stage renal disease: Secondary | ICD-10-CM | POA: Diagnosis not present

## 2019-08-19 DIAGNOSIS — N2581 Secondary hyperparathyroidism of renal origin: Secondary | ICD-10-CM | POA: Diagnosis not present

## 2019-08-19 DIAGNOSIS — Z23 Encounter for immunization: Secondary | ICD-10-CM | POA: Diagnosis not present

## 2019-08-19 DIAGNOSIS — Z992 Dependence on renal dialysis: Secondary | ICD-10-CM | POA: Diagnosis not present

## 2019-08-19 DIAGNOSIS — D631 Anemia in chronic kidney disease: Secondary | ICD-10-CM | POA: Diagnosis not present

## 2019-08-22 DIAGNOSIS — D631 Anemia in chronic kidney disease: Secondary | ICD-10-CM | POA: Diagnosis not present

## 2019-08-22 DIAGNOSIS — D509 Iron deficiency anemia, unspecified: Secondary | ICD-10-CM | POA: Diagnosis not present

## 2019-08-22 DIAGNOSIS — N2581 Secondary hyperparathyroidism of renal origin: Secondary | ICD-10-CM | POA: Diagnosis not present

## 2019-08-22 DIAGNOSIS — Z23 Encounter for immunization: Secondary | ICD-10-CM | POA: Diagnosis not present

## 2019-08-22 DIAGNOSIS — Z992 Dependence on renal dialysis: Secondary | ICD-10-CM | POA: Diagnosis not present

## 2019-08-22 DIAGNOSIS — N186 End stage renal disease: Secondary | ICD-10-CM | POA: Diagnosis not present

## 2019-08-24 DIAGNOSIS — Z23 Encounter for immunization: Secondary | ICD-10-CM | POA: Diagnosis not present

## 2019-08-24 DIAGNOSIS — D631 Anemia in chronic kidney disease: Secondary | ICD-10-CM | POA: Diagnosis not present

## 2019-08-24 DIAGNOSIS — N2581 Secondary hyperparathyroidism of renal origin: Secondary | ICD-10-CM | POA: Diagnosis not present

## 2019-08-24 DIAGNOSIS — Z992 Dependence on renal dialysis: Secondary | ICD-10-CM | POA: Diagnosis not present

## 2019-08-24 DIAGNOSIS — N186 End stage renal disease: Secondary | ICD-10-CM | POA: Diagnosis not present

## 2019-08-24 DIAGNOSIS — D509 Iron deficiency anemia, unspecified: Secondary | ICD-10-CM | POA: Diagnosis not present

## 2019-08-26 DIAGNOSIS — D509 Iron deficiency anemia, unspecified: Secondary | ICD-10-CM | POA: Diagnosis not present

## 2019-08-26 DIAGNOSIS — N2581 Secondary hyperparathyroidism of renal origin: Secondary | ICD-10-CM | POA: Diagnosis not present

## 2019-08-26 DIAGNOSIS — Z992 Dependence on renal dialysis: Secondary | ICD-10-CM | POA: Diagnosis not present

## 2019-08-26 DIAGNOSIS — N186 End stage renal disease: Secondary | ICD-10-CM | POA: Diagnosis not present

## 2019-08-26 DIAGNOSIS — Z23 Encounter for immunization: Secondary | ICD-10-CM | POA: Diagnosis not present

## 2019-08-26 DIAGNOSIS — D631 Anemia in chronic kidney disease: Secondary | ICD-10-CM | POA: Diagnosis not present

## 2019-08-28 DIAGNOSIS — R079 Chest pain, unspecified: Secondary | ICD-10-CM | POA: Diagnosis not present

## 2019-08-28 DIAGNOSIS — I509 Heart failure, unspecified: Secondary | ICD-10-CM | POA: Diagnosis not present

## 2019-08-28 DIAGNOSIS — Z5321 Procedure and treatment not carried out due to patient leaving prior to being seen by health care provider: Secondary | ICD-10-CM | POA: Diagnosis not present

## 2019-08-28 DIAGNOSIS — Z7982 Long term (current) use of aspirin: Secondary | ICD-10-CM | POA: Diagnosis not present

## 2019-08-28 DIAGNOSIS — K922 Gastrointestinal hemorrhage, unspecified: Secondary | ICD-10-CM | POA: Diagnosis not present

## 2019-08-28 DIAGNOSIS — Z20828 Contact with and (suspected) exposure to other viral communicable diseases: Secondary | ICD-10-CM | POA: Diagnosis not present

## 2019-08-28 DIAGNOSIS — D62 Acute posthemorrhagic anemia: Secondary | ICD-10-CM | POA: Diagnosis not present

## 2019-08-28 DIAGNOSIS — Z79899 Other long term (current) drug therapy: Secondary | ICD-10-CM | POA: Diagnosis not present

## 2019-08-28 DIAGNOSIS — Z992 Dependence on renal dialysis: Secondary | ICD-10-CM | POA: Diagnosis not present

## 2019-08-28 DIAGNOSIS — B20 Human immunodeficiency virus [HIV] disease: Secondary | ICD-10-CM | POA: Diagnosis not present

## 2019-08-28 DIAGNOSIS — Z87891 Personal history of nicotine dependence: Secondary | ICD-10-CM | POA: Diagnosis not present

## 2019-08-28 DIAGNOSIS — Z96649 Presence of unspecified artificial hip joint: Secondary | ICD-10-CM | POA: Diagnosis not present

## 2019-08-28 DIAGNOSIS — I120 Hypertensive chronic kidney disease with stage 5 chronic kidney disease or end stage renal disease: Secondary | ICD-10-CM | POA: Diagnosis not present

## 2019-08-28 DIAGNOSIS — N186 End stage renal disease: Secondary | ICD-10-CM | POA: Diagnosis not present

## 2019-08-28 DIAGNOSIS — I10 Essential (primary) hypertension: Secondary | ICD-10-CM | POA: Diagnosis not present

## 2019-08-28 DIAGNOSIS — D582 Other hemoglobinopathies: Secondary | ICD-10-CM | POA: Diagnosis not present

## 2019-08-29 DIAGNOSIS — K644 Residual hemorrhoidal skin tags: Secondary | ICD-10-CM | POA: Diagnosis not present

## 2019-08-29 DIAGNOSIS — D509 Iron deficiency anemia, unspecified: Secondary | ICD-10-CM | POA: Diagnosis not present

## 2019-08-29 DIAGNOSIS — D631 Anemia in chronic kidney disease: Secondary | ICD-10-CM | POA: Diagnosis not present

## 2019-08-29 DIAGNOSIS — Z992 Dependence on renal dialysis: Secondary | ICD-10-CM | POA: Diagnosis not present

## 2019-08-29 DIAGNOSIS — N2581 Secondary hyperparathyroidism of renal origin: Secondary | ICD-10-CM | POA: Diagnosis not present

## 2019-08-29 DIAGNOSIS — I772 Rupture of artery: Secondary | ICD-10-CM | POA: Diagnosis not present

## 2019-08-29 DIAGNOSIS — L729 Follicular cyst of the skin and subcutaneous tissue, unspecified: Secondary | ICD-10-CM | POA: Diagnosis not present

## 2019-08-29 DIAGNOSIS — N186 End stage renal disease: Secondary | ICD-10-CM | POA: Diagnosis not present

## 2019-08-29 DIAGNOSIS — Z79899 Other long term (current) drug therapy: Secondary | ICD-10-CM | POA: Diagnosis not present

## 2019-08-29 DIAGNOSIS — Z5181 Encounter for therapeutic drug level monitoring: Secondary | ICD-10-CM | POA: Diagnosis not present

## 2019-08-29 DIAGNOSIS — K6289 Other specified diseases of anus and rectum: Secondary | ICD-10-CM | POA: Diagnosis not present

## 2019-08-29 DIAGNOSIS — I120 Hypertensive chronic kidney disease with stage 5 chronic kidney disease or end stage renal disease: Secondary | ICD-10-CM | POA: Diagnosis not present

## 2019-08-29 DIAGNOSIS — L0231 Cutaneous abscess of buttock: Secondary | ICD-10-CM | POA: Diagnosis not present

## 2019-08-29 DIAGNOSIS — Z23 Encounter for immunization: Secondary | ICD-10-CM | POA: Diagnosis not present

## 2019-08-29 DIAGNOSIS — Z87891 Personal history of nicotine dependence: Secondary | ICD-10-CM | POA: Diagnosis not present

## 2019-08-31 DIAGNOSIS — Z23 Encounter for immunization: Secondary | ICD-10-CM | POA: Diagnosis not present

## 2019-08-31 DIAGNOSIS — D509 Iron deficiency anemia, unspecified: Secondary | ICD-10-CM | POA: Diagnosis not present

## 2019-08-31 DIAGNOSIS — D631 Anemia in chronic kidney disease: Secondary | ICD-10-CM | POA: Diagnosis not present

## 2019-08-31 DIAGNOSIS — N186 End stage renal disease: Secondary | ICD-10-CM | POA: Diagnosis not present

## 2019-08-31 DIAGNOSIS — Z992 Dependence on renal dialysis: Secondary | ICD-10-CM | POA: Diagnosis not present

## 2019-08-31 DIAGNOSIS — N2581 Secondary hyperparathyroidism of renal origin: Secondary | ICD-10-CM | POA: Diagnosis not present

## 2019-09-02 DIAGNOSIS — Z23 Encounter for immunization: Secondary | ICD-10-CM | POA: Diagnosis not present

## 2019-09-02 DIAGNOSIS — D631 Anemia in chronic kidney disease: Secondary | ICD-10-CM | POA: Diagnosis not present

## 2019-09-02 DIAGNOSIS — D509 Iron deficiency anemia, unspecified: Secondary | ICD-10-CM | POA: Diagnosis not present

## 2019-09-02 DIAGNOSIS — N2581 Secondary hyperparathyroidism of renal origin: Secondary | ICD-10-CM | POA: Diagnosis not present

## 2019-09-02 DIAGNOSIS — Z992 Dependence on renal dialysis: Secondary | ICD-10-CM | POA: Diagnosis not present

## 2019-09-02 DIAGNOSIS — N186 End stage renal disease: Secondary | ICD-10-CM | POA: Diagnosis not present

## 2019-09-05 DIAGNOSIS — Z992 Dependence on renal dialysis: Secondary | ICD-10-CM | POA: Diagnosis not present

## 2019-09-05 DIAGNOSIS — N2581 Secondary hyperparathyroidism of renal origin: Secondary | ICD-10-CM | POA: Diagnosis not present

## 2019-09-05 DIAGNOSIS — D509 Iron deficiency anemia, unspecified: Secondary | ICD-10-CM | POA: Diagnosis not present

## 2019-09-05 DIAGNOSIS — N186 End stage renal disease: Secondary | ICD-10-CM | POA: Diagnosis not present

## 2019-09-05 DIAGNOSIS — Z23 Encounter for immunization: Secondary | ICD-10-CM | POA: Diagnosis not present

## 2019-09-05 DIAGNOSIS — D631 Anemia in chronic kidney disease: Secondary | ICD-10-CM | POA: Diagnosis not present

## 2019-09-06 DIAGNOSIS — N186 End stage renal disease: Secondary | ICD-10-CM | POA: Diagnosis not present

## 2019-09-06 DIAGNOSIS — Z20828 Contact with and (suspected) exposure to other viral communicable diseases: Secondary | ICD-10-CM | POA: Diagnosis not present

## 2019-09-06 DIAGNOSIS — Z992 Dependence on renal dialysis: Secondary | ICD-10-CM | POA: Diagnosis not present

## 2019-09-06 DIAGNOSIS — I120 Hypertensive chronic kidney disease with stage 5 chronic kidney disease or end stage renal disease: Secondary | ICD-10-CM | POA: Diagnosis not present

## 2019-09-06 DIAGNOSIS — Z79899 Other long term (current) drug therapy: Secondary | ICD-10-CM | POA: Diagnosis not present

## 2019-09-06 DIAGNOSIS — Z96649 Presence of unspecified artificial hip joint: Secondary | ICD-10-CM | POA: Diagnosis not present

## 2019-09-06 DIAGNOSIS — Z87891 Personal history of nicotine dependence: Secondary | ICD-10-CM | POA: Diagnosis not present

## 2019-09-06 DIAGNOSIS — Z7982 Long term (current) use of aspirin: Secondary | ICD-10-CM | POA: Diagnosis not present

## 2019-09-07 DIAGNOSIS — Z992 Dependence on renal dialysis: Secondary | ICD-10-CM | POA: Diagnosis not present

## 2019-09-07 DIAGNOSIS — Z23 Encounter for immunization: Secondary | ICD-10-CM | POA: Diagnosis not present

## 2019-09-07 DIAGNOSIS — N186 End stage renal disease: Secondary | ICD-10-CM | POA: Diagnosis not present

## 2019-09-07 DIAGNOSIS — N2581 Secondary hyperparathyroidism of renal origin: Secondary | ICD-10-CM | POA: Diagnosis not present

## 2019-09-07 DIAGNOSIS — D631 Anemia in chronic kidney disease: Secondary | ICD-10-CM | POA: Diagnosis not present

## 2019-09-07 DIAGNOSIS — D509 Iron deficiency anemia, unspecified: Secondary | ICD-10-CM | POA: Diagnosis not present

## 2019-09-09 DIAGNOSIS — Z992 Dependence on renal dialysis: Secondary | ICD-10-CM | POA: Diagnosis not present

## 2019-09-09 DIAGNOSIS — Z23 Encounter for immunization: Secondary | ICD-10-CM | POA: Diagnosis not present

## 2019-09-09 DIAGNOSIS — N186 End stage renal disease: Secondary | ICD-10-CM | POA: Diagnosis not present

## 2019-09-09 DIAGNOSIS — D631 Anemia in chronic kidney disease: Secondary | ICD-10-CM | POA: Diagnosis not present

## 2019-09-09 DIAGNOSIS — N2581 Secondary hyperparathyroidism of renal origin: Secondary | ICD-10-CM | POA: Diagnosis not present

## 2019-09-09 DIAGNOSIS — D509 Iron deficiency anemia, unspecified: Secondary | ICD-10-CM | POA: Diagnosis not present

## 2019-09-12 DIAGNOSIS — N189 Chronic kidney disease, unspecified: Secondary | ICD-10-CM | POA: Diagnosis not present

## 2019-09-12 DIAGNOSIS — T82591A Other mechanical complication of surgically created arteriovenous shunt, initial encounter: Secondary | ICD-10-CM | POA: Diagnosis not present

## 2019-09-12 DIAGNOSIS — T8241XA Breakdown (mechanical) of vascular dialysis catheter, initial encounter: Secondary | ICD-10-CM | POA: Diagnosis not present

## 2019-09-12 DIAGNOSIS — Z992 Dependence on renal dialysis: Secondary | ICD-10-CM | POA: Diagnosis not present

## 2019-09-12 DIAGNOSIS — I129 Hypertensive chronic kidney disease with stage 1 through stage 4 chronic kidney disease, or unspecified chronic kidney disease: Secondary | ICD-10-CM | POA: Diagnosis not present

## 2019-09-12 DIAGNOSIS — Z87891 Personal history of nicotine dependence: Secondary | ICD-10-CM | POA: Diagnosis not present

## 2019-09-13 DIAGNOSIS — R109 Unspecified abdominal pain: Secondary | ICD-10-CM | POA: Diagnosis not present

## 2019-09-13 DIAGNOSIS — R918 Other nonspecific abnormal finding of lung field: Secondary | ICD-10-CM | POA: Diagnosis not present

## 2019-09-13 DIAGNOSIS — I132 Hypertensive heart and chronic kidney disease with heart failure and with stage 5 chronic kidney disease, or end stage renal disease: Secondary | ICD-10-CM | POA: Diagnosis not present

## 2019-09-13 DIAGNOSIS — Z21 Asymptomatic human immunodeficiency virus [HIV] infection status: Secondary | ICD-10-CM | POA: Diagnosis not present

## 2019-09-13 DIAGNOSIS — N186 End stage renal disease: Secondary | ICD-10-CM | POA: Diagnosis not present

## 2019-09-13 DIAGNOSIS — T82510A Breakdown (mechanical) of surgically created arteriovenous fistula, initial encounter: Secondary | ICD-10-CM | POA: Diagnosis not present

## 2019-09-13 DIAGNOSIS — Z992 Dependence on renal dialysis: Secondary | ICD-10-CM | POA: Diagnosis not present

## 2019-09-13 DIAGNOSIS — J9811 Atelectasis: Secondary | ICD-10-CM | POA: Diagnosis not present

## 2019-09-13 DIAGNOSIS — I2510 Atherosclerotic heart disease of native coronary artery without angina pectoris: Secondary | ICD-10-CM | POA: Diagnosis not present

## 2019-09-13 DIAGNOSIS — Z7982 Long term (current) use of aspirin: Secondary | ICD-10-CM | POA: Diagnosis not present

## 2019-09-13 DIAGNOSIS — R0602 Shortness of breath: Secondary | ICD-10-CM | POA: Diagnosis not present

## 2019-09-13 DIAGNOSIS — Z87891 Personal history of nicotine dependence: Secondary | ICD-10-CM | POA: Diagnosis not present

## 2019-09-13 DIAGNOSIS — I509 Heart failure, unspecified: Secondary | ICD-10-CM | POA: Diagnosis not present

## 2019-09-13 DIAGNOSIS — Z8572 Personal history of non-Hodgkin lymphomas: Secondary | ICD-10-CM | POA: Diagnosis not present

## 2019-09-13 DIAGNOSIS — T82858A Stenosis of vascular prosthetic devices, implants and grafts, initial encounter: Secondary | ICD-10-CM | POA: Diagnosis not present

## 2019-09-13 DIAGNOSIS — D631 Anemia in chronic kidney disease: Secondary | ICD-10-CM | POA: Diagnosis not present

## 2019-09-13 DIAGNOSIS — T82868A Thrombosis of vascular prosthetic devices, implants and grafts, initial encounter: Secondary | ICD-10-CM | POA: Diagnosis not present

## 2019-09-13 DIAGNOSIS — Z955 Presence of coronary angioplasty implant and graft: Secondary | ICD-10-CM | POA: Diagnosis not present

## 2019-09-13 DIAGNOSIS — N250 Renal osteodystrophy: Secondary | ICD-10-CM | POA: Diagnosis not present

## 2019-09-14 DIAGNOSIS — N186 End stage renal disease: Secondary | ICD-10-CM | POA: Diagnosis not present

## 2019-09-14 DIAGNOSIS — I132 Hypertensive heart and chronic kidney disease with heart failure and with stage 5 chronic kidney disease, or end stage renal disease: Secondary | ICD-10-CM | POA: Diagnosis not present

## 2019-09-14 DIAGNOSIS — I509 Heart failure, unspecified: Secondary | ICD-10-CM | POA: Diagnosis not present

## 2019-09-14 DIAGNOSIS — T82858A Stenosis of vascular prosthetic devices, implants and grafts, initial encounter: Secondary | ICD-10-CM | POA: Diagnosis not present

## 2019-09-14 DIAGNOSIS — T82510A Breakdown (mechanical) of surgically created arteriovenous fistula, initial encounter: Secondary | ICD-10-CM | POA: Diagnosis not present

## 2019-09-14 DIAGNOSIS — T82868A Thrombosis of vascular prosthetic devices, implants and grafts, initial encounter: Secondary | ICD-10-CM | POA: Diagnosis not present

## 2019-09-16 DIAGNOSIS — N2581 Secondary hyperparathyroidism of renal origin: Secondary | ICD-10-CM | POA: Diagnosis not present

## 2019-09-16 DIAGNOSIS — D631 Anemia in chronic kidney disease: Secondary | ICD-10-CM | POA: Diagnosis not present

## 2019-09-16 DIAGNOSIS — Z23 Encounter for immunization: Secondary | ICD-10-CM | POA: Diagnosis not present

## 2019-09-16 DIAGNOSIS — N186 End stage renal disease: Secondary | ICD-10-CM | POA: Diagnosis not present

## 2019-09-16 DIAGNOSIS — Z992 Dependence on renal dialysis: Secondary | ICD-10-CM | POA: Diagnosis not present

## 2019-09-16 DIAGNOSIS — D509 Iron deficiency anemia, unspecified: Secondary | ICD-10-CM | POA: Diagnosis not present

## 2019-09-18 DIAGNOSIS — Z992 Dependence on renal dialysis: Secondary | ICD-10-CM | POA: Diagnosis not present

## 2019-09-18 DIAGNOSIS — N186 End stage renal disease: Secondary | ICD-10-CM | POA: Diagnosis not present

## 2019-09-19 DIAGNOSIS — Z23 Encounter for immunization: Secondary | ICD-10-CM | POA: Diagnosis not present

## 2019-09-19 DIAGNOSIS — R1110 Vomiting, unspecified: Secondary | ICD-10-CM | POA: Diagnosis not present

## 2019-09-19 DIAGNOSIS — N186 End stage renal disease: Secondary | ICD-10-CM | POA: Diagnosis not present

## 2019-09-19 DIAGNOSIS — D509 Iron deficiency anemia, unspecified: Secondary | ICD-10-CM | POA: Diagnosis not present

## 2019-09-19 DIAGNOSIS — Z992 Dependence on renal dialysis: Secondary | ICD-10-CM | POA: Diagnosis not present

## 2019-09-19 DIAGNOSIS — D631 Anemia in chronic kidney disease: Secondary | ICD-10-CM | POA: Diagnosis not present

## 2019-09-19 DIAGNOSIS — E8779 Other fluid overload: Secondary | ICD-10-CM | POA: Diagnosis not present

## 2019-09-21 DIAGNOSIS — Z992 Dependence on renal dialysis: Secondary | ICD-10-CM | POA: Diagnosis not present

## 2019-09-21 DIAGNOSIS — D631 Anemia in chronic kidney disease: Secondary | ICD-10-CM | POA: Diagnosis not present

## 2019-09-21 DIAGNOSIS — N186 End stage renal disease: Secondary | ICD-10-CM | POA: Diagnosis not present

## 2019-09-21 DIAGNOSIS — R1110 Vomiting, unspecified: Secondary | ICD-10-CM | POA: Diagnosis not present

## 2019-09-21 DIAGNOSIS — Z23 Encounter for immunization: Secondary | ICD-10-CM | POA: Diagnosis not present

## 2019-09-21 DIAGNOSIS — D509 Iron deficiency anemia, unspecified: Secondary | ICD-10-CM | POA: Diagnosis not present

## 2019-09-23 DIAGNOSIS — N186 End stage renal disease: Secondary | ICD-10-CM | POA: Diagnosis not present

## 2019-09-23 DIAGNOSIS — D509 Iron deficiency anemia, unspecified: Secondary | ICD-10-CM | POA: Diagnosis not present

## 2019-09-23 DIAGNOSIS — R1110 Vomiting, unspecified: Secondary | ICD-10-CM | POA: Diagnosis not present

## 2019-09-23 DIAGNOSIS — Z992 Dependence on renal dialysis: Secondary | ICD-10-CM | POA: Diagnosis not present

## 2019-09-23 DIAGNOSIS — D631 Anemia in chronic kidney disease: Secondary | ICD-10-CM | POA: Diagnosis not present

## 2019-09-23 DIAGNOSIS — Z23 Encounter for immunization: Secondary | ICD-10-CM | POA: Diagnosis not present

## 2019-09-27 DIAGNOSIS — Z23 Encounter for immunization: Secondary | ICD-10-CM | POA: Diagnosis not present

## 2019-09-27 DIAGNOSIS — R1110 Vomiting, unspecified: Secondary | ICD-10-CM | POA: Diagnosis not present

## 2019-09-27 DIAGNOSIS — Z992 Dependence on renal dialysis: Secondary | ICD-10-CM | POA: Diagnosis not present

## 2019-09-27 DIAGNOSIS — N186 End stage renal disease: Secondary | ICD-10-CM | POA: Diagnosis not present

## 2019-09-27 DIAGNOSIS — D631 Anemia in chronic kidney disease: Secondary | ICD-10-CM | POA: Diagnosis not present

## 2019-09-27 DIAGNOSIS — D509 Iron deficiency anemia, unspecified: Secondary | ICD-10-CM | POA: Diagnosis not present

## 2019-09-28 DIAGNOSIS — Z992 Dependence on renal dialysis: Secondary | ICD-10-CM | POA: Diagnosis not present

## 2019-09-28 DIAGNOSIS — N186 End stage renal disease: Secondary | ICD-10-CM | POA: Diagnosis not present

## 2019-09-28 DIAGNOSIS — R1110 Vomiting, unspecified: Secondary | ICD-10-CM | POA: Diagnosis not present

## 2019-09-28 DIAGNOSIS — Z23 Encounter for immunization: Secondary | ICD-10-CM | POA: Diagnosis not present

## 2019-09-28 DIAGNOSIS — D509 Iron deficiency anemia, unspecified: Secondary | ICD-10-CM | POA: Diagnosis not present

## 2019-09-28 DIAGNOSIS — D631 Anemia in chronic kidney disease: Secondary | ICD-10-CM | POA: Diagnosis not present

## 2019-09-30 DIAGNOSIS — Z23 Encounter for immunization: Secondary | ICD-10-CM | POA: Diagnosis not present

## 2019-09-30 DIAGNOSIS — D509 Iron deficiency anemia, unspecified: Secondary | ICD-10-CM | POA: Diagnosis not present

## 2019-09-30 DIAGNOSIS — R1110 Vomiting, unspecified: Secondary | ICD-10-CM | POA: Diagnosis not present

## 2019-09-30 DIAGNOSIS — D631 Anemia in chronic kidney disease: Secondary | ICD-10-CM | POA: Diagnosis not present

## 2019-09-30 DIAGNOSIS — N186 End stage renal disease: Secondary | ICD-10-CM | POA: Diagnosis not present

## 2019-09-30 DIAGNOSIS — Z992 Dependence on renal dialysis: Secondary | ICD-10-CM | POA: Diagnosis not present

## 2019-10-03 DIAGNOSIS — Z23 Encounter for immunization: Secondary | ICD-10-CM | POA: Diagnosis not present

## 2019-10-03 DIAGNOSIS — D631 Anemia in chronic kidney disease: Secondary | ICD-10-CM | POA: Diagnosis not present

## 2019-10-03 DIAGNOSIS — D509 Iron deficiency anemia, unspecified: Secondary | ICD-10-CM | POA: Diagnosis not present

## 2019-10-03 DIAGNOSIS — Z992 Dependence on renal dialysis: Secondary | ICD-10-CM | POA: Diagnosis not present

## 2019-10-03 DIAGNOSIS — R1110 Vomiting, unspecified: Secondary | ICD-10-CM | POA: Diagnosis not present

## 2019-10-03 DIAGNOSIS — N186 End stage renal disease: Secondary | ICD-10-CM | POA: Diagnosis not present

## 2019-10-05 DIAGNOSIS — Z992 Dependence on renal dialysis: Secondary | ICD-10-CM | POA: Diagnosis not present

## 2019-10-05 DIAGNOSIS — D631 Anemia in chronic kidney disease: Secondary | ICD-10-CM | POA: Diagnosis not present

## 2019-10-05 DIAGNOSIS — N186 End stage renal disease: Secondary | ICD-10-CM | POA: Diagnosis not present

## 2019-10-05 DIAGNOSIS — Z23 Encounter for immunization: Secondary | ICD-10-CM | POA: Diagnosis not present

## 2019-10-05 DIAGNOSIS — R1110 Vomiting, unspecified: Secondary | ICD-10-CM | POA: Diagnosis not present

## 2019-10-05 DIAGNOSIS — D509 Iron deficiency anemia, unspecified: Secondary | ICD-10-CM | POA: Diagnosis not present

## 2019-10-07 DIAGNOSIS — D631 Anemia in chronic kidney disease: Secondary | ICD-10-CM | POA: Diagnosis not present

## 2019-10-07 DIAGNOSIS — Z23 Encounter for immunization: Secondary | ICD-10-CM | POA: Diagnosis not present

## 2019-10-07 DIAGNOSIS — Z992 Dependence on renal dialysis: Secondary | ICD-10-CM | POA: Diagnosis not present

## 2019-10-07 DIAGNOSIS — N186 End stage renal disease: Secondary | ICD-10-CM | POA: Diagnosis not present

## 2019-10-07 DIAGNOSIS — D509 Iron deficiency anemia, unspecified: Secondary | ICD-10-CM | POA: Diagnosis not present

## 2019-10-07 DIAGNOSIS — R1110 Vomiting, unspecified: Secondary | ICD-10-CM | POA: Diagnosis not present

## 2019-10-09 DIAGNOSIS — Z992 Dependence on renal dialysis: Secondary | ICD-10-CM | POA: Diagnosis not present

## 2019-10-09 DIAGNOSIS — R1110 Vomiting, unspecified: Secondary | ICD-10-CM | POA: Diagnosis not present

## 2019-10-09 DIAGNOSIS — D631 Anemia in chronic kidney disease: Secondary | ICD-10-CM | POA: Diagnosis not present

## 2019-10-09 DIAGNOSIS — N186 End stage renal disease: Secondary | ICD-10-CM | POA: Diagnosis not present

## 2019-10-09 DIAGNOSIS — Z23 Encounter for immunization: Secondary | ICD-10-CM | POA: Diagnosis not present

## 2019-10-09 DIAGNOSIS — D509 Iron deficiency anemia, unspecified: Secondary | ICD-10-CM | POA: Diagnosis not present

## 2019-10-11 DIAGNOSIS — Z992 Dependence on renal dialysis: Secondary | ICD-10-CM | POA: Diagnosis not present

## 2019-10-11 DIAGNOSIS — N186 End stage renal disease: Secondary | ICD-10-CM | POA: Diagnosis not present

## 2019-10-11 DIAGNOSIS — Z23 Encounter for immunization: Secondary | ICD-10-CM | POA: Diagnosis not present

## 2019-10-11 DIAGNOSIS — D631 Anemia in chronic kidney disease: Secondary | ICD-10-CM | POA: Diagnosis not present

## 2019-10-11 DIAGNOSIS — D509 Iron deficiency anemia, unspecified: Secondary | ICD-10-CM | POA: Diagnosis not present

## 2019-10-11 DIAGNOSIS — R1110 Vomiting, unspecified: Secondary | ICD-10-CM | POA: Diagnosis not present

## 2019-10-14 DIAGNOSIS — Z23 Encounter for immunization: Secondary | ICD-10-CM | POA: Diagnosis not present

## 2019-10-14 DIAGNOSIS — D631 Anemia in chronic kidney disease: Secondary | ICD-10-CM | POA: Diagnosis not present

## 2019-10-14 DIAGNOSIS — R1110 Vomiting, unspecified: Secondary | ICD-10-CM | POA: Diagnosis not present

## 2019-10-14 DIAGNOSIS — N186 End stage renal disease: Secondary | ICD-10-CM | POA: Diagnosis not present

## 2019-10-14 DIAGNOSIS — Z992 Dependence on renal dialysis: Secondary | ICD-10-CM | POA: Diagnosis not present

## 2019-10-14 DIAGNOSIS — D509 Iron deficiency anemia, unspecified: Secondary | ICD-10-CM | POA: Diagnosis not present

## 2019-10-17 DIAGNOSIS — D509 Iron deficiency anemia, unspecified: Secondary | ICD-10-CM | POA: Diagnosis not present

## 2019-10-17 DIAGNOSIS — Z992 Dependence on renal dialysis: Secondary | ICD-10-CM | POA: Diagnosis not present

## 2019-10-17 DIAGNOSIS — R1110 Vomiting, unspecified: Secondary | ICD-10-CM | POA: Diagnosis not present

## 2019-10-17 DIAGNOSIS — D631 Anemia in chronic kidney disease: Secondary | ICD-10-CM | POA: Diagnosis not present

## 2019-10-17 DIAGNOSIS — N186 End stage renal disease: Secondary | ICD-10-CM | POA: Diagnosis not present

## 2019-10-17 DIAGNOSIS — Z23 Encounter for immunization: Secondary | ICD-10-CM | POA: Diagnosis not present

## 2019-10-18 IMAGING — XA IR FLUORO GUIDE CV LINE*R*
1 series · 1 of 1 positions shown · non-contrast
Comparison: none

INDICATION: End-stage renal disease. In need of durable intravenous access for
the initiation of dialysis.

[Series 1: single · 1 of 1 slices shown]
[im 1/1]
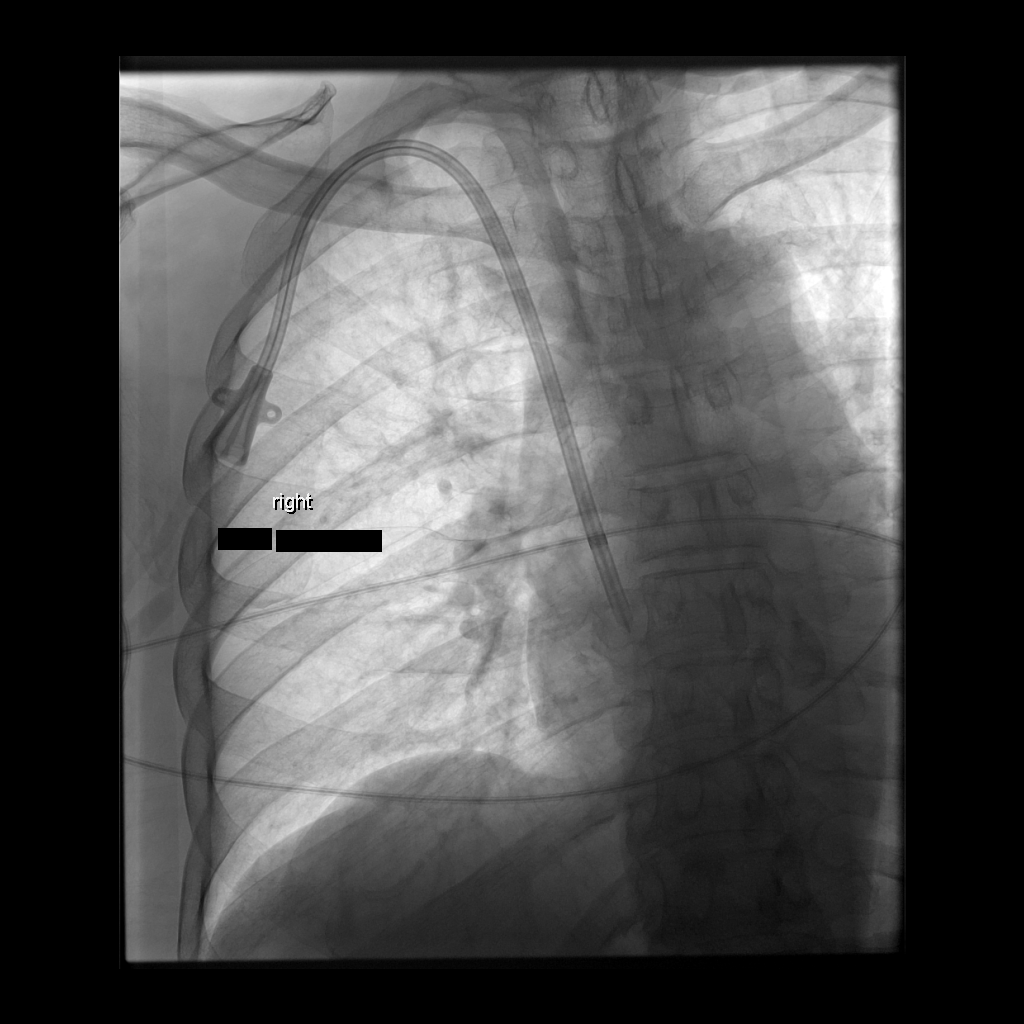

[1 of 1 positions shown; findings below may reference images not displayed]

EXAM:
TUNNELED CENTRAL VENOUS HEMODIALYSIS CATHETER PLACEMENT WITH
ULTRASOUND AND FLUOROSCOPIC GUIDANCE

MEDICATIONS:
Vancomycin 1 gm IV . The antibiotic was given in an appropriate time
interval prior to skin puncture.

ANESTHESIA/SEDATION:
Versed 2 mg IV; Fentanyl 50 mcg IV;

Moderate Sedation Time:  17 minutes

The patient was continuously monitored during the procedure by the
interventional radiology nurse under my direct supervision.

FLUOROSCOPY TIME:  Fluoroscopy Time: 24 seconds (5 mGy).

COMPLICATIONS:
None immediate.



After creating a small venotomy incision, a micropuncture kit was
utilized to access the internal jugular vein. Real-time ultrasound
guidance was utilized for vascular access including the acquisition
of a permanent ultrasound image documenting patency of the accessed
vessel. The microwire was utilized to measure appropriate catheter
length.

A stiff Glidewire was advanced to the level of the IVC and the
micropuncture sheath was exchanged for a peel-away sheath. A
palindrome tunneled hemodialysis catheter measuring 19 cm from tip
to cuff was tunneled in a retrograde fashion from the anterior chest
wall to the venotomy incision.

The catheter was then placed through the peel-away sheath with tips
ultimately positioned within the superior aspect of the right
atrium. Final catheter positioning was confirmed and documented with
a spot radiographic image. The catheter aspirates and flushes
normally. The catheter was flushed with appropriate volume heparin
dwells.

The catheter exit site was secured with a 0-Prolene retention
suture. The venotomy incision was closed with an interrupted 4-0
Vicryl, Dermabond and Yoanita. Dressings were applied. The
patient tolerated the procedure well without immediate post
procedural complication.
IMPRESSION: Successful placement of 19 cm tip to cuff tunneled hemodialysis
catheter via the right internal jugular vein with tips terminating
within the superior aspect of the right atrium. The catheter is
ready for immediate use.

## 2019-10-26 ENCOUNTER — Encounter: Payer: Self-pay | Admitting: Infectious Diseases

## 2019-10-26 NOTE — Progress Notes (Signed)
Patient ID: [name], male   DOB: [birth year], 59 y.o.   MRN: [id number] Called patient from Viral Load list he advised he has moved to [facility]

## 2019-12-25 IMAGING — US US RENAL
1 series · 14 of 25 positions shown · non-contrast
Comparison: None.

CLINICAL DATA: FALAXA, elevated BUN and creatinine.

EXAM:
RENAL / URINARY TRACT ULTRASOUND COMPLETE

[Series 1: us renal · 0.25mm/px · 14 of 28 slices shown]
[im 1/28]
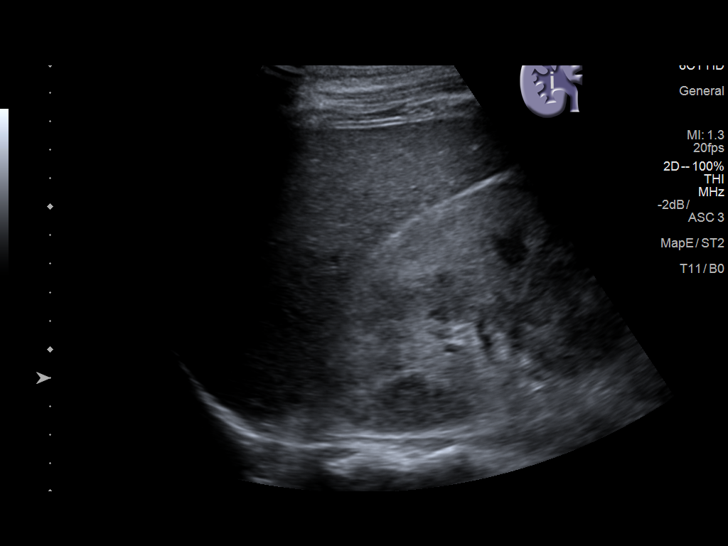
[im 3/28]
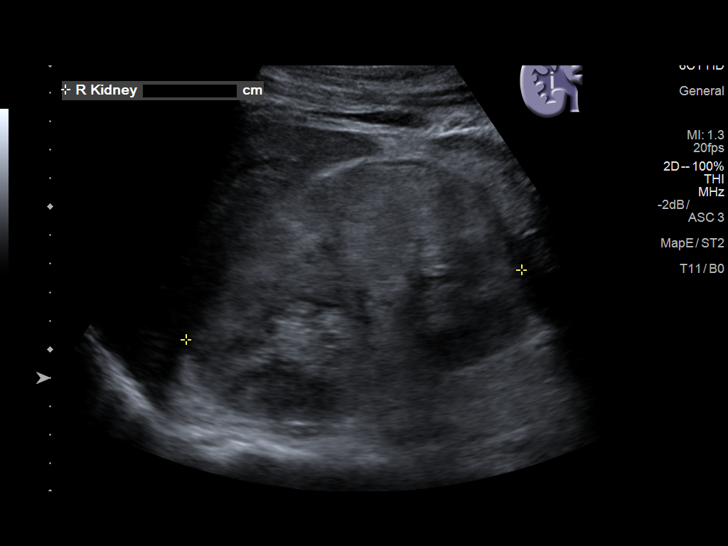
[im 5/28]
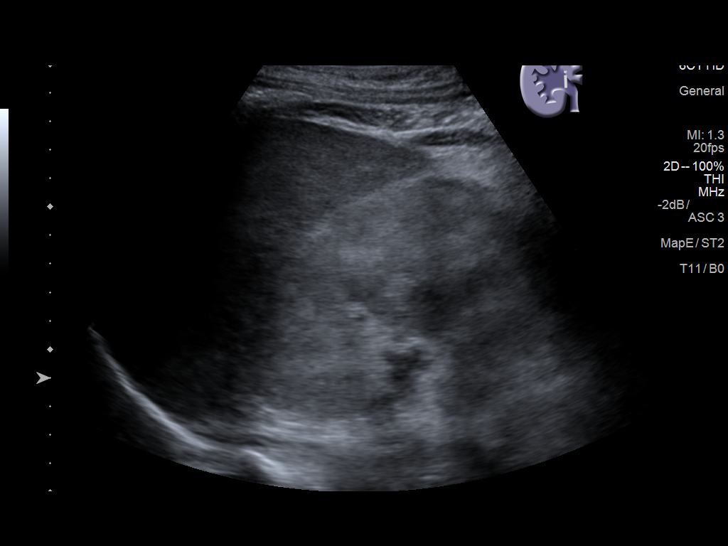
[im 7/28]
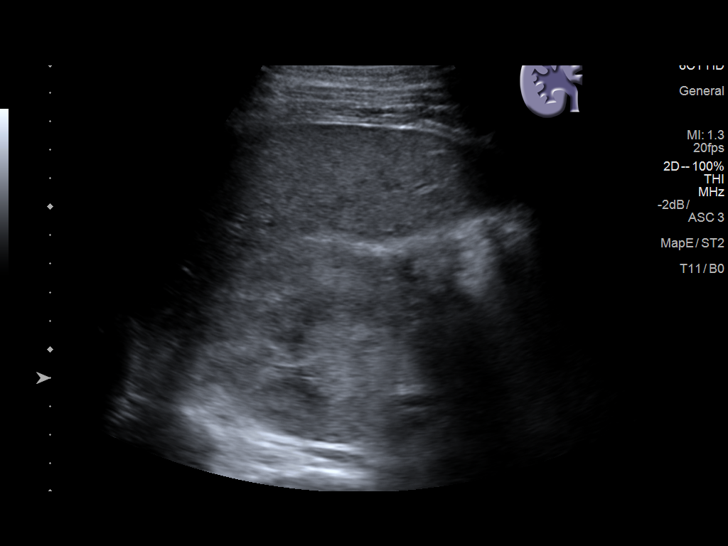
[im 10/28]
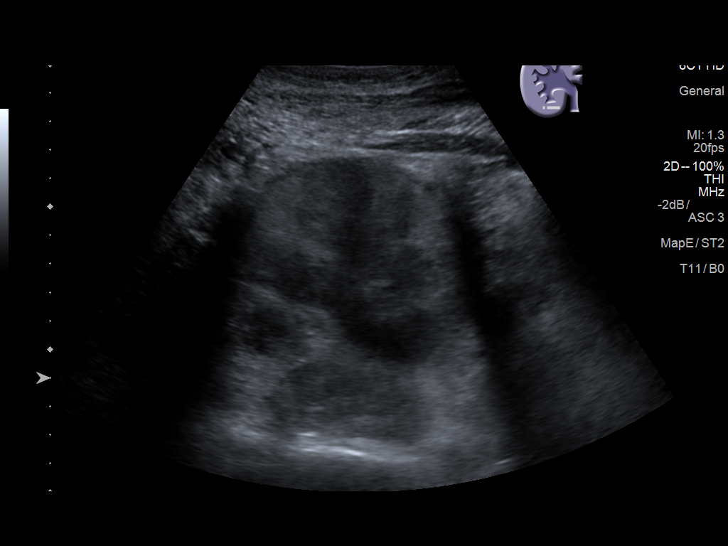
[im 11/28]
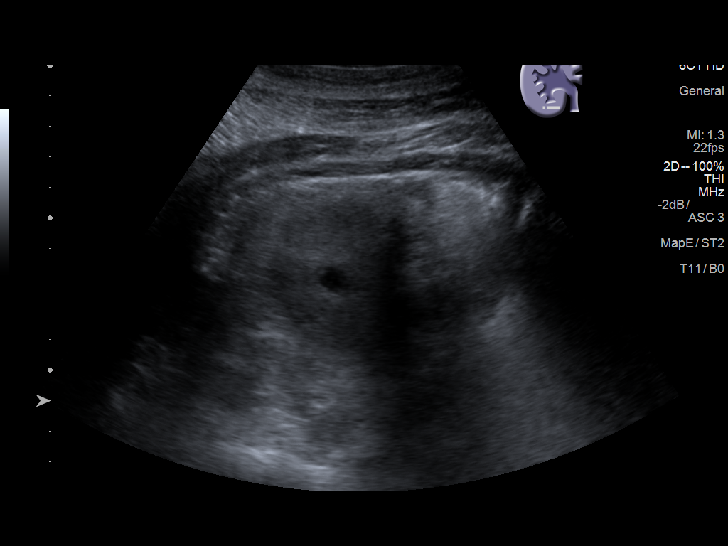
[im 13/28]
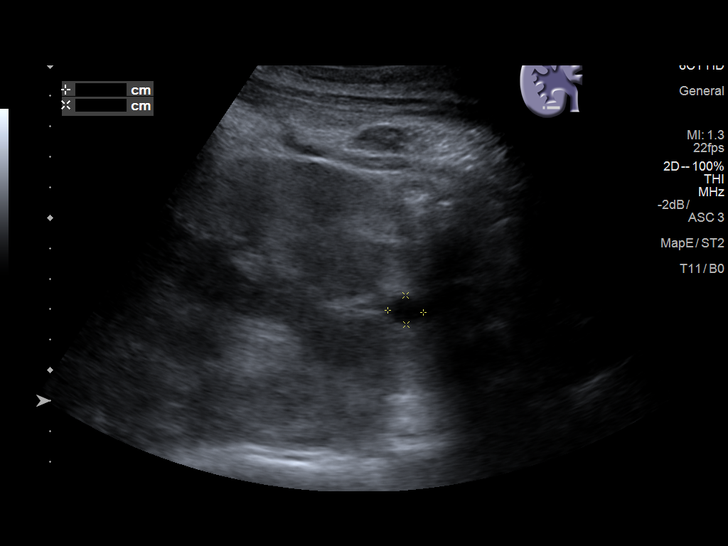
[im 15/28]
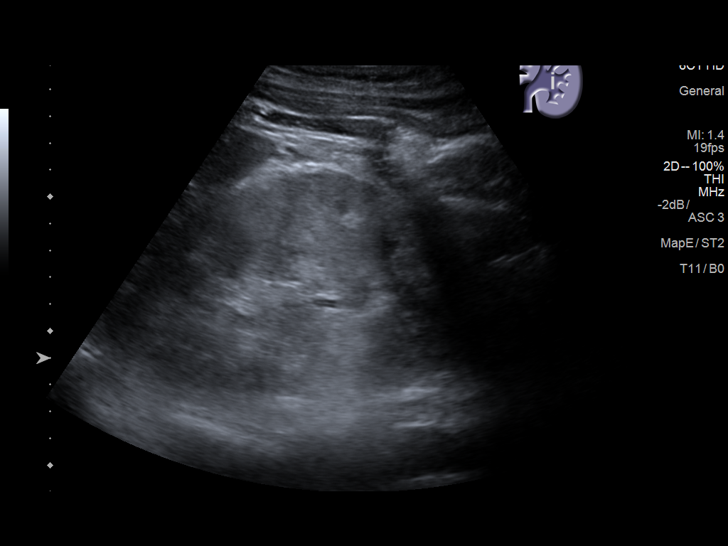
[im 17/28]
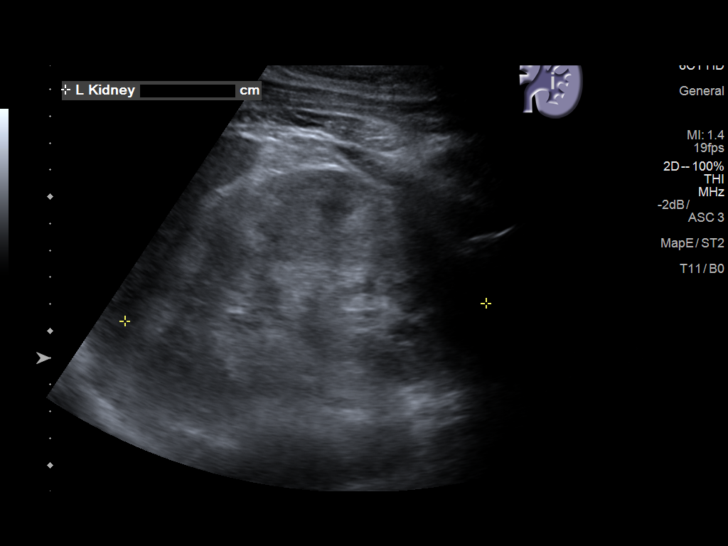
[im 19/28]
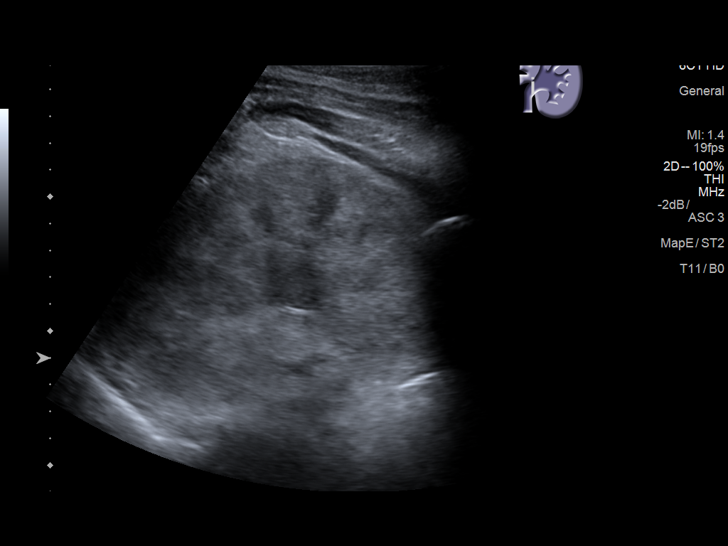
[im 21/28]
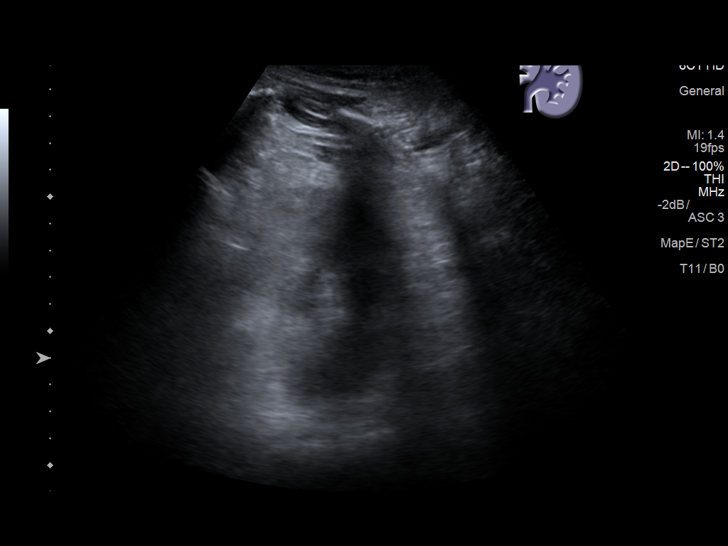
[im 23/28]
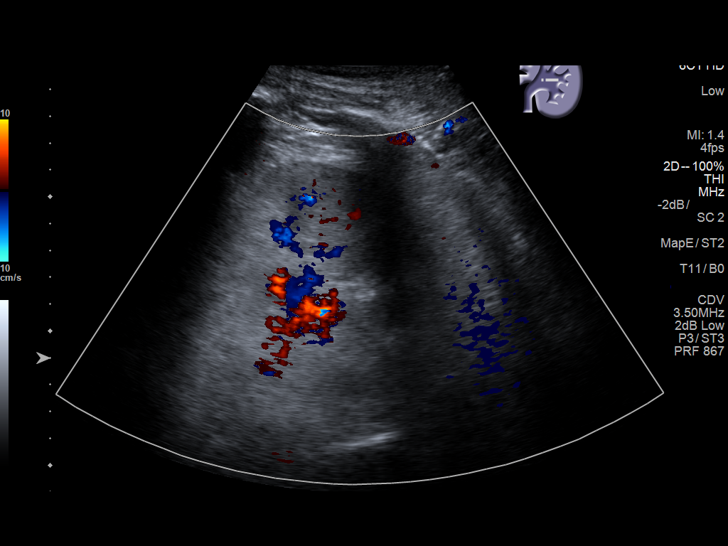
[im 25/28]
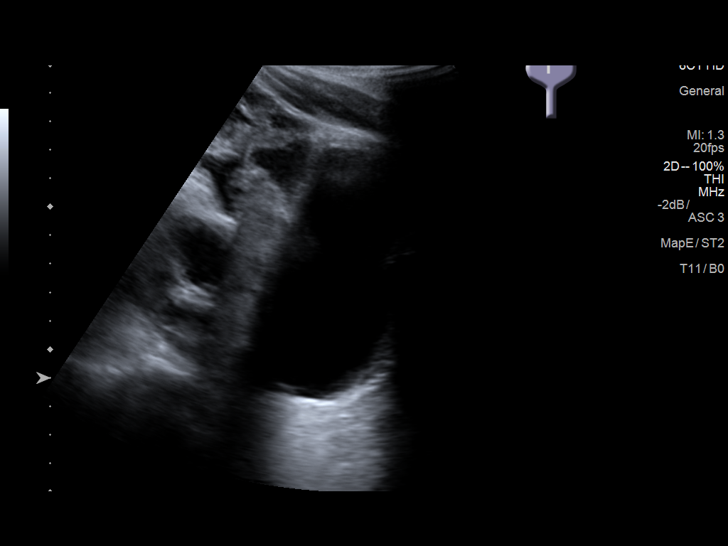
[im 28/28]
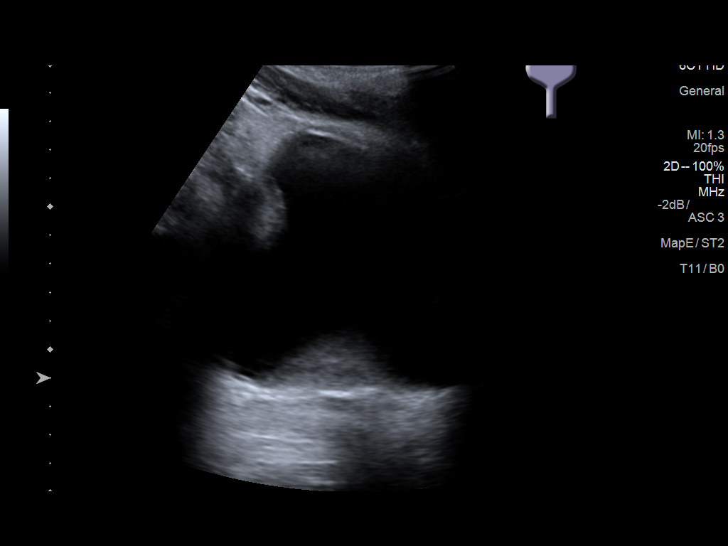

[14 of 25 positions shown; findings below may reference images not displayed]

FINDINGS: Right Kidney:

Length: 12 cm. RIGHT renal cortex is diffusely echogenic. Probable
small cyst measuring 12 mm. No suspicious cortical mass or
hydronephrosis seen.

Left Kidney:

Length: 13.5 cm. LEFT renal cortex is also diffusely echogenic. No
suspicious mass or hydronephrosis seen.

Bladder:

Appears normal for degree of bladder distention.
IMPRESSION: 1. Both kidneys are diffusely echogenic indicating medical renal
disease. No suspicious mass or hydronephrosis within either kidney.
2. Bladder is unremarkable, partially decompressed.

## 2020-06-19 ENCOUNTER — Ambulatory Visit: Payer: Medicare Other | Admitting: Infectious Diseases

## 2023-03-01 ENCOUNTER — Inpatient Hospital Stay (HOSPITAL_COMMUNITY)
Admission: EM | Admit: 2023-03-01 | Discharge: 2023-03-02 | DRG: 974 | Disposition: A | Discharge status: Home / Self Care | Payer: 59 | Attending: Internal Medicine | Admitting: Internal Medicine

## 2023-03-01 ENCOUNTER — Other Ambulatory Visit: Payer: Self-pay

## 2023-03-01 ENCOUNTER — Emergency Department (HOSPITAL_COMMUNITY): Payer: 59

## 2023-03-01 DIAGNOSIS — R7881 Bacteremia: Secondary | ICD-10-CM | POA: Diagnosis present

## 2023-03-01 DIAGNOSIS — G894 Chronic pain syndrome: Secondary | ICD-10-CM | POA: Diagnosis present

## 2023-03-01 DIAGNOSIS — I2510 Atherosclerotic heart disease of native coronary artery without angina pectoris: Secondary | ICD-10-CM | POA: Diagnosis present

## 2023-03-01 DIAGNOSIS — Z91199 Patient's noncompliance with other medical treatment and regimen due to unspecified reason: Secondary | ICD-10-CM

## 2023-03-01 DIAGNOSIS — B998 Other infectious disease: Secondary | ICD-10-CM | POA: Diagnosis present

## 2023-03-01 DIAGNOSIS — Z7901 Long term (current) use of anticoagulants: Secondary | ICD-10-CM

## 2023-03-01 DIAGNOSIS — Z992 Dependence on renal dialysis: Secondary | ICD-10-CM

## 2023-03-01 DIAGNOSIS — I358 Other nonrheumatic aortic valve disorders: Secondary | ICD-10-CM

## 2023-03-01 DIAGNOSIS — K219 Gastro-esophageal reflux disease without esophagitis: Secondary | ICD-10-CM | POA: Diagnosis present

## 2023-03-01 DIAGNOSIS — F03911 Unspecified dementia, unspecified severity, with agitation: Secondary | ICD-10-CM | POA: Diagnosis present

## 2023-03-01 DIAGNOSIS — E785 Hyperlipidemia, unspecified: Secondary | ICD-10-CM | POA: Diagnosis present

## 2023-03-01 DIAGNOSIS — T827XXA Infection and inflammatory reaction due to other cardiac and vascular devices, implants and grafts, initial encounter: Secondary | ICD-10-CM

## 2023-03-01 DIAGNOSIS — G253 Myoclonus: Secondary | ICD-10-CM | POA: Diagnosis not present

## 2023-03-01 DIAGNOSIS — I255 Ischemic cardiomyopathy: Secondary | ICD-10-CM | POA: Diagnosis present

## 2023-03-01 DIAGNOSIS — I444 Left anterior fascicular block: Secondary | ICD-10-CM | POA: Diagnosis present

## 2023-03-01 DIAGNOSIS — Z951 Presence of aortocoronary bypass graft: Secondary | ICD-10-CM

## 2023-03-01 DIAGNOSIS — E875 Hyperkalemia: Secondary | ICD-10-CM | POA: Diagnosis not present

## 2023-03-01 DIAGNOSIS — I132 Hypertensive heart and chronic kidney disease with heart failure and with stage 5 chronic kidney disease, or end stage renal disease: Secondary | ICD-10-CM | POA: Diagnosis present

## 2023-03-01 DIAGNOSIS — I214 Non-ST elevation (NSTEMI) myocardial infarction: Secondary | ICD-10-CM | POA: Diagnosis present

## 2023-03-01 DIAGNOSIS — N186 End stage renal disease: Secondary | ICD-10-CM

## 2023-03-01 DIAGNOSIS — Z880 Allergy status to penicillin: Secondary | ICD-10-CM

## 2023-03-01 DIAGNOSIS — F32A Depression, unspecified: Secondary | ICD-10-CM | POA: Diagnosis present

## 2023-03-01 DIAGNOSIS — I5042 Chronic combined systolic (congestive) and diastolic (congestive) heart failure: Secondary | ICD-10-CM | POA: Diagnosis present

## 2023-03-01 DIAGNOSIS — Z955 Presence of coronary angioplasty implant and graft: Secondary | ICD-10-CM

## 2023-03-01 DIAGNOSIS — I330 Acute and subacute infective endocarditis: Secondary | ICD-10-CM | POA: Diagnosis not present

## 2023-03-01 DIAGNOSIS — M898X9 Other specified disorders of bone, unspecified site: Secondary | ICD-10-CM | POA: Diagnosis present

## 2023-03-01 DIAGNOSIS — Y828 Other medical devices associated with adverse incidents: Secondary | ICD-10-CM | POA: Diagnosis present

## 2023-03-01 DIAGNOSIS — D631 Anemia in chronic kidney disease: Secondary | ICD-10-CM | POA: Diagnosis present

## 2023-03-01 DIAGNOSIS — J449 Chronic obstructive pulmonary disease, unspecified: Secondary | ICD-10-CM | POA: Diagnosis present

## 2023-03-01 DIAGNOSIS — E876 Hypokalemia: Secondary | ICD-10-CM | POA: Diagnosis present

## 2023-03-01 DIAGNOSIS — Z8572 Personal history of non-Hodgkin lymphomas: Secondary | ICD-10-CM

## 2023-03-01 DIAGNOSIS — Z79899 Other long term (current) drug therapy: Secondary | ICD-10-CM

## 2023-03-01 DIAGNOSIS — B20 Human immunodeficiency virus [HIV] disease: Secondary | ICD-10-CM | POA: Diagnosis present

## 2023-03-01 DIAGNOSIS — Z7982 Long term (current) use of aspirin: Secondary | ICD-10-CM

## 2023-03-01 DIAGNOSIS — Z8249 Family history of ischemic heart disease and other diseases of the circulatory system: Secondary | ICD-10-CM

## 2023-03-01 DIAGNOSIS — I480 Paroxysmal atrial fibrillation: Secondary | ICD-10-CM | POA: Diagnosis present

## 2023-03-01 DIAGNOSIS — I440 Atrioventricular block, first degree: Secondary | ICD-10-CM | POA: Diagnosis present

## 2023-03-01 DIAGNOSIS — T80211A Bloodstream infection due to central venous catheter, initial encounter: Secondary | ICD-10-CM | POA: Diagnosis present

## 2023-03-01 DIAGNOSIS — Z7902 Long term (current) use of antithrombotics/antiplatelets: Secondary | ICD-10-CM

## 2023-03-01 DIAGNOSIS — Z5329 Procedure and treatment not carried out because of patient's decision for other reasons: Secondary | ICD-10-CM | POA: Diagnosis present

## 2023-03-01 DIAGNOSIS — Z833 Family history of diabetes mellitus: Secondary | ICD-10-CM

## 2023-03-01 DIAGNOSIS — N2581 Secondary hyperparathyroidism of renal origin: Secondary | ICD-10-CM | POA: Diagnosis present

## 2023-03-01 DIAGNOSIS — I5022 Chronic systolic (congestive) heart failure: Secondary | ICD-10-CM | POA: Diagnosis present

## 2023-03-01 DIAGNOSIS — F0393 Unspecified dementia, unspecified severity, with mood disturbance: Secondary | ICD-10-CM | POA: Diagnosis present

## 2023-03-01 DIAGNOSIS — Z87891 Personal history of nicotine dependence: Secondary | ICD-10-CM

## 2023-03-01 DIAGNOSIS — E871 Hypo-osmolality and hyponatremia: Secondary | ICD-10-CM | POA: Diagnosis present

## 2023-03-01 DIAGNOSIS — G8929 Other chronic pain: Secondary | ICD-10-CM | POA: Diagnosis present

## 2023-03-01 DIAGNOSIS — I420 Dilated cardiomyopathy: Secondary | ICD-10-CM | POA: Diagnosis present

## 2023-03-01 LAB — BASIC METABOLIC PANEL
Anion gap: 15 (ref 5–15)
Anion gap: 19 — ABNORMAL HIGH (ref 5–15)
BUN: 62 mg/dL — ABNORMAL HIGH (ref 8–23)
BUN: 63 mg/dL — ABNORMAL HIGH (ref 8–23)
CO2: 26 mmol/L (ref 22–32)
CO2: 23 mmol/L (ref 22–32)
Calcium: 9.1 mg/dL (ref 8.9–10.3)
Calcium: 9.2 mg/dL (ref 8.9–10.3)
Chloride: 94 mmol/L — ABNORMAL LOW (ref 98–111)
Chloride: 94 mmol/L — ABNORMAL LOW (ref 98–111)
Creatinine, Ser: 8.76 mg/dL — ABNORMAL HIGH (ref 0.61–1.24)
Creatinine, Ser: 8.75 mg/dL — ABNORMAL HIGH (ref 0.61–1.24)
GFR, Estimated: 6 mL/min — ABNORMAL LOW (ref >60)
GFR, Estimated: 6 mL/min — ABNORMAL LOW (ref >60)
Glucose, Bld: 108 mg/dL — ABNORMAL HIGH (ref 70–99)
Glucose, Bld: 104 mg/dL — ABNORMAL HIGH (ref 70–99)
Potassium: 6.5 mmol/L (ref 3.5–5.1)
Potassium: 6.7 mmol/L (ref 3.5–5.1)
Sodium: 135 mmol/L (ref 135–145)
Sodium: 136 mmol/L (ref 135–145)

## 2023-03-01 LAB — COMPREHENSIVE METABOLIC PANEL
ALT: 24 U/L (ref 0–44)
AST: 39 U/L (ref 15–41)
Albumin: 3.3 g/dL — ABNORMAL LOW (ref 3.5–5.0)
Alkaline Phosphatase: 76 U/L (ref 38–126)
Anion gap: 18 — ABNORMAL HIGH (ref 5–15)
BUN: 60 mg/dL — ABNORMAL HIGH (ref 8–23)
CO2: 24 mmol/L (ref 22–32)
Calcium: 9.2 mg/dL (ref 8.9–10.3)
Chloride: 92 mmol/L — ABNORMAL LOW (ref 98–111)
Creatinine, Ser: 8.46 mg/dL — ABNORMAL HIGH (ref 0.61–1.24)
GFR, Estimated: 7 mL/min — ABNORMAL LOW (ref >60)
Glucose, Bld: 106 mg/dL — ABNORMAL HIGH (ref 70–99)
Potassium: 6.9 mmol/L (ref 3.5–5.1)
Sodium: 134 mmol/L — ABNORMAL LOW (ref 135–145)
Total Bilirubin: 1 mg/dL (ref 0.3–1.2)
Total Protein: 8.2 g/dL — ABNORMAL HIGH (ref 6.5–8.1)

## 2023-03-01 LAB — HEPATIC FUNCTION PANEL
ALT: 25 U/L (ref 0–44)
AST: 46 U/L — ABNORMAL HIGH (ref 15–41)
Albumin: 3.4 g/dL — ABNORMAL LOW (ref 3.5–5.0)
Alkaline Phosphatase: 83 U/L (ref 38–126)
Bilirubin, Direct: 0.4 mg/dL — ABNORMAL HIGH (ref 0.0–0.2)
Indirect Bilirubin: 0.5 mg/dL (ref 0.3–0.9)
Total Bilirubin: 0.9 mg/dL (ref 0.3–1.2)
Total Protein: 8.3 g/dL — ABNORMAL HIGH (ref 6.5–8.1)

## 2023-03-01 LAB — CBC WITH DIFFERENTIAL/PLATELET
Abs Immature Granulocytes: 0.02 10*3/uL (ref 0.00–0.07)
Basophils Absolute: 0.1 10*3/uL (ref 0.0–0.1)
Basophils Relative: 1 %
Eosinophils Absolute: 0.3 10*3/uL (ref 0.0–0.5)
Eosinophils Relative: 5 %
HCT: 40.4 % (ref 39.0–52.0)
Hemoglobin: 12.5 g/dL — ABNORMAL LOW (ref 13.0–17.0)
Immature Granulocytes: 0 %
Lymphocytes Relative: 27 %
Lymphs Abs: 1.8 10*3/uL (ref 0.7–4.0)
MCH: 28.2 pg (ref 26.0–34.0)
MCHC: 30.9 g/dL (ref 30.0–36.0)
MCV: 91.2 fL (ref 80.0–100.0)
Monocytes Absolute: 1.2 10*3/uL — ABNORMAL HIGH (ref 0.1–1.0)
Monocytes Relative: 18 %
Neutro Abs: 3.3 10*3/uL (ref 1.7–7.7)
Neutrophils Relative %: 49 %
Platelets: 219 10*3/uL (ref 150–400)
RBC: 4.43 MIL/uL (ref 4.22–5.81)
RDW: 19.6 % — ABNORMAL HIGH (ref 11.5–15.5)
WBC: 6.7 10*3/uL (ref 4.0–10.5)
nRBC: 0 % (ref 0.0–0.2)

## 2023-03-01 LAB — HEPATITIS B SURFACE ANTIGEN: Hepatitis B Surface Ag: NONREACTIVE

## 2023-03-01 LAB — TROPONIN I (HIGH SENSITIVITY)
Troponin I (High Sensitivity): 86 ng/L — ABNORMAL HIGH (ref <18)
Troponin I (High Sensitivity): 105 ng/L (ref <18)

## 2023-03-01 LAB — VANCOMYCIN, RANDOM: Vancomycin Rm: 16 ug/mL

## 2023-03-01 MED ORDER — ASPIRIN 325 MG PO TABS
325.0000 mg | ORAL_TABLET | Freq: Every day | ORAL | Status: DC
  Administered 2023-03-01: 325 mg via ORAL
  Filled 2023-03-01: qty 1

## 2023-03-01 MED ORDER — ALBUTEROL SULFATE (2.5 MG/3ML) 0.083% IN NEBU: 5.0000 mg | INHALATION_SOLUTION | Freq: Once | RESPIRATORY_TRACT | Status: DC

## 2023-03-01 MED ORDER — VANCOMYCIN HCL 1500 MG/300ML IV SOLN
1500.0000 mg | Freq: Once | INTRAVENOUS | Status: DC
  Filled 2023-03-01: qty 300

## 2023-03-01 MED ORDER — SODIUM ZIRCONIUM CYCLOSILICATE 10 G PO PACK
10.0000 g | PACK | Freq: Once | ORAL | Status: AC
  Administered 2023-03-01: 10 g via ORAL
  Filled 2023-03-01: qty 1

## 2023-03-01 MED ORDER — CALCIUM GLUCONATE-NACL 1-0.675 GM/50ML-% IV SOLN
1.0000 g | Freq: Once | INTRAVENOUS | Status: AC
  Administered 2023-03-01: 1000 mg via INTRAVENOUS
  Filled 2023-03-01: qty 50

## 2023-03-01 MED ORDER — LORAZEPAM 1 MG PO TABS
1.0000 mg | ORAL_TABLET | Freq: Once | ORAL | Status: AC
  Administered 2023-03-01: 1 mg via ORAL
  Filled 2023-03-01: qty 1

## 2023-03-01 MED ORDER — CHLORHEXIDINE GLUCONATE CLOTH 2 % EX PADS: 6.0000 | MEDICATED_PAD | Freq: Every day | CUTANEOUS | Status: DC

## 2023-03-01 MED ORDER — DEXTROSE 50 % IV SOLN: 1.0000 | Freq: Once | INTRAVENOUS | Status: DC

## 2023-03-01 MED ORDER — HYDROMORPHONE HCL 1 MG/ML IJ SOLN
1.0000 mg | Freq: Once | INTRAMUSCULAR | Status: AC
  Administered 2023-03-01: 1 mg via INTRAVENOUS
  Filled 2023-03-01: qty 1

## 2023-03-01 MED ORDER — DEXTROSE 50 % IV SOLN
1.0000 | Freq: Once | INTRAVENOUS | Status: AC
  Administered 2023-03-01: 50 mL via INTRAVENOUS
  Filled 2023-03-01: qty 50

## 2023-03-01 MED ORDER — INSULIN ASPART 100 UNIT/ML IV SOLN: 5.0000 [IU] | Freq: Once | INTRAVENOUS | Status: DC

## 2023-03-01 MED ORDER — CALCIUM GLUCONATE-NACL 1-0.675 GM/50ML-% IV SOLN: 1.0000 g | Freq: Once | INTRAVENOUS | Status: DC

## 2023-03-01 MED ORDER — ALBUTEROL SULFATE (2.5 MG/3ML) 0.083% IN NEBU
2.5000 mg | INHALATION_SOLUTION | Freq: Once | RESPIRATORY_TRACT | Status: AC
  Administered 2023-03-01: 2.5 mg via RESPIRATORY_TRACT
  Filled 2023-03-01: qty 3

## 2023-03-01 MED ORDER — SODIUM CHLORIDE 0.9 % IV SOLN
2.0000 g | Freq: Once | INTRAVENOUS | Status: AC
  Administered 2023-03-01: 2 g via INTRAVENOUS
  Filled 2023-03-01: qty 20

## 2023-03-01 MED ORDER — INSULIN ASPART 100 UNIT/ML IV SOLN
5.0000 [IU] | Freq: Once | INTRAVENOUS | Status: AC
  Administered 2023-03-01: 5 [IU] via INTRAVENOUS

## 2023-03-01 NOTE — ED Provider Notes (Signed)
Care assumed from Dr. Karene Fry, patient with aortic root abscess, hyperkalemic requiring emergent dialysis.  He is a patient at [facility], but has left there are several times AGAINST MEDICAL ADVICE because not having his pain treated adequately and is refusing to be transferred back there.  He will need to be admitted here.   [name] Booze, MD 03/03/23 2242

## 2023-03-01 NOTE — ED Notes (Signed)
Pt report received from previous nurse. Pt A&O x4, vitals stable, states he has chest pain. Call bell in reach. Will medicate per orders

## 2023-03-01 NOTE — ED Notes (Signed)
Called pt for room, no response.  

## 2023-03-01 NOTE — ED Notes (Signed)
3rd set of cultures refused by pt, risks/benefits explained.

## 2023-03-01 NOTE — ED Notes (Signed)
Bp 163/124 hr 69

## 2023-03-01 NOTE — ED Notes (Signed)
Pt medicated, states pain is rising again. No other acute distress noted, call bell in reach.

## 2023-03-01 NOTE — ED Provider Notes (Signed)
[place] EMERGENCY DEPARTMENT AT [facility] Provider Note   CSN: [id number] Arrival date & time: 03/01/23  1519     History  Chief Complaint  Patient presents with   Chest Pain   Abdominal Pain    [name] is a 62 y.o. male.   Chest Pain Associated symptoms: abdominal pain   Abdominal Pain Associated symptoms: chest pain      62 y.o. M with hx of ESRD on HD, HIV, HTN, DM, CAD with prior CABG in 2022, paroxysmal afib, who was initially transferred to [facility] from OSH for unstable angina and aortic valve endocarditis. He was hospitalized from 4/3-4/11 for treatment but left AMA after declined to turn over his SL nitro which he had been taking in his room. He returned to [facility] on 4/13 with complaints of chest pain and was readmitted to resume treatment but left again AMA in evening citing displeasure with his care. Per [facility] charting, patient was argumentative and agitated and was temporarily restrained and placed on medical hold. He was deemed to have capacity and allowed to leave and subsequently presented to [facility] for chest pain. He left AMA again and presents to the ED today for further management of his chest pain. He states he had fevers last week but denies fevers now. He continues to endorse chest discomfort, substernally. No shortness of breath. No headaches. No weakness or numbness. He endorses generalized abdominal discomfort. No vomiting.   Home Medications Prior to Admission medications   Medication Sig Start Date End Date Taking? Authorizing Provider  amLODipine (NORVASC) 10 MG tablet Take 1 tablet (10 mg total) by mouth at bedtime. 05/26/18   Shon Hale, MD  aspirin 81 MG chewable tablet Chew 1 tablet (81 mg total) by mouth daily. With Food 05/27/18   Shon Hale, MD  azithromycin (ZITHROMAX) 600 MG tablet Take 2 tablets (1,200 mg total) by mouth once a week. 06/01/18   Shon Hale, MD  bisacodyl (DULCOLAX) 10 MG suppository Place 1 suppository (10  mg total) rectally daily as needed for moderate constipation. 05/26/18   Shon Hale, MD  calcium acetate (PHOSLO) 667 MG capsule Take 3 capsules (2,001 mg total) by mouth 3 (three) times daily with meals. 05/26/18   Shon Hale, MD  carvedilol (COREG) 12.5 MG tablet Take 1 tablet (12.5 mg total) by mouth 2 (two) times daily with a meal. 05/26/18   Emokpae, Courage, MD  dapsone 100 MG tablet Take 1 tablet (100 mg total) by mouth daily. 05/26/18   Emokpae, Courage, MD  darunavir-cobicistat (PREZCOBIX) 800-150 MG tablet Take 1 tablet by mouth daily with breakfast. Swallow whole. Do NOT crush, break or chew tablets. Take with food. 05/26/18   Shon Hale, MD  dolutegravir (TIVICAY) 50 MG tablet Take 1 tablet (50 mg total) by mouth daily. 05/26/18   Shon Hale, MD  fluconazole (DIFLUCAN) 100 MG tablet one tablet each week (Thursdays) 05/26/18   Emokpae, Courage, MD  ondansetron (ZOFRAN) 4 MG tablet Take 1 tablet (4 mg total) by mouth every 6 (six) hours as needed for nausea. 05/26/18   Shon Hale, MD  polyethylene glycol (MIRALAX / GLYCOLAX) packet Take 17 g by mouth daily. 05/26/18   Shon Hale, MD  rilpivirine (EDURANT) 25 MG TABS tablet Take 1 tablet (25 mg total) by mouth daily with breakfast. 05/27/18   Shon Hale, MD      Allergies    Penicillins    Review of Systems   Review of Systems  Cardiovascular:  Positive for chest pain.  Gastrointestinal:  Positive for abdominal pain.  All other systems reviewed and are negative.   Physical Exam Updated Vital Signs BP (!) 141/68   Pulse 77   Temp 97.9 F (36.6 C) (Oral)   Resp 19   Ht  (1.651 m)   Wt 63.5 kg   SpO2 95%   BMI 23.30 kg/m  Physical Exam Vitals and nursing note reviewed.  Constitutional:      General: He is not in acute distress.    Appearance: He is well-developed.  HENT:     Head: Normocephalic and atraumatic.  Eyes:     Conjunctiva/sclera: Conjunctivae normal.  Cardiovascular:      Rate and Rhythm: Normal rate and regular rhythm.     Heart sounds: No murmur heard. Pulmonary:     Effort: Pulmonary effort is normal. No respiratory distress.     Breath sounds: Normal breath sounds.  Abdominal:     Palpations: Abdomen is soft.     Tenderness: There is no abdominal tenderness.  Musculoskeletal:        General: No swelling.     Cervical back: Neck supple.  Skin:    General: Skin is warm and dry.     Capillary Refill: Capillary refill takes less than 2 seconds.  Neurological:     Mental Status: He is alert.  Psychiatric:        Mood and Affect: Mood normal.     ED Results / Procedures / Treatments   Labs (all labs ordered are listed, but only abnormal results are displayed) Labs Reviewed  CBC WITH DIFFERENTIAL/PLATELET - Abnormal; Notable for the following components:      Result Value   Hemoglobin 12.5 (*)    RDW 19.6 (*)    Monocytes Absolute 1.2 (*)    All other components within normal limits  COMPREHENSIVE METABOLIC PANEL - Abnormal; Notable for the following components:   Sodium 134 (*)    Potassium 6.9 (*)    Chloride 92 (*)    Glucose, Bld 106 (*)    BUN 60 (*)    Creatinine, Ser 8.46 (*)    Total Protein 8.2 (*)    Albumin 3.3 (*)    GFR, Estimated 7 (*)    Anion gap 18 (*)    All other components within normal limits  BASIC METABOLIC PANEL - Abnormal; Notable for the following components:   Potassium 6.5 (*)    Chloride 94 (*)    Glucose, Bld 108 (*)    BUN 62 (*)    Creatinine, Ser 8.76 (*)    GFR, Estimated 6 (*)    All other components within normal limits  HEPATIC FUNCTION PANEL - Abnormal; Notable for the following components:   Total Protein 8.3 (*)    Albumin 3.4 (*)    AST 46 (*)    Bilirubin, Direct 0.4 (*)    All other components within normal limits  BASIC METABOLIC PANEL - Abnormal; Notable for the following components:   Potassium 6.7 (*)    Chloride 94 (*)    Glucose, Bld 104 (*)    BUN 63 (*)    Creatinine, Ser  8.75 (*)    GFR, Estimated 6 (*)    Anion gap 19 (*)    All other components within normal limits  TROPONIN I (HIGH SENSITIVITY) - Abnormal; Notable for the following components:   Troponin I (High Sensitivity) 86 (*)    All other components within  normal limits  TROPONIN I (HIGH SENSITIVITY) - Abnormal; Notable for the following components:   Troponin I (High Sensitivity) 105 (*)    All other components within normal limits  CULTURE, BLOOD (ROUTINE X 2)  CULTURE, BLOOD (ROUTINE X 2)  CULTURE, BLOOD (SINGLE)  VANCOMYCIN, RANDOM  HEPATITIS B SURFACE ANTIGEN  POTASSIUM  POTASSIUM  HEPATITIS B SURFACE ANTIBODY, QUANTITATIVE  BASIC METABOLIC PANEL  POTASSIUM    EKG EKG Interpretation  Date/Time:   y.o. M with hx of ESRD on HD, HIV, HTN, DM, CAD with prior CABG in 2022, paroxysmal afib,  who was initially transferred to [facility] from OSH for unstable angina and aortic valve endocarditis. He was  hospitalized from 4/3-4/11 for treatment but left AMA after declined to turn over his SL nitro which he had been taking in his room. He returned to [facility] on 4/13 with complaints of chest pain and was readmitted to resume treatment but left again AMA in evening citing displeasure with his care. Per [facility] charting, patient was argumentative and agitated and was temporarily restrained and placed on medical hold. He was deemed to have capacity and allowed to leave and subsequently presented to [facility] for chest pain. He left AMA again and presents to the ED today for further management of his chest pain. He states he had fevers last week but denies fevers now. He continues to endorse chest discomfort, substernally. No shortness of breath. No headaches. No weakness or numbness. He endorses generalized abdominal discomfort. No vomiting.   On arrival, the patient was afebrile, not tachycardic or tachypneic, saturating well on room air, normotensive.  IV access was obtained and initial labs were drawn. The patient was agitated with nursing, refusing attempts at IV access initially.   Initial EKG revealed sinus rhythm, ventricular rate 65, first-degree AV block, LVH present, prolonged QTc with a QTc of 484, chest x-ray with borderline mild cardiomegaly, no active disease.  Initial laboratory evaluation was hemolyzed with a potassium of 6.9.  The patient recently received dialysis yesterday while in the hospital.  He has no significant EKG changes with no QRS widening and no significantly peaked T waves, repeat potassium was ordered for collection.  CBC was without a leukocytosis, mild anemia to 12.5 noted, initial troponin was elevated at 8 6.  Per recent [facility] Notes: # Aortic Valve Endocarditis # Bicuspid AoV  # Severe AR TTE on admission concerning for new aortic valve endocarditis and abscess. 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 - BCx 4/4, 4/6 no growth; 4/7 NGTD -  ID following for HIV and endocarditis. Empiric vancomycin IV dosed per pharmacy and ceftriaxone 2g IV q 24h started on 4/8. F/u Karius testing - pending for final abx plan - CTS determined not surgical candidate given lack of HF symptoms - per ID recs, ordered Coxiella, Bartonella, Brucella, ARB blood cx, urine Histo Ag, legionella urine Ag (hasn't been collected yet; patient frequently declined blood draws/testing)   Initial laboratory evaluation significant for troponin 286, repeat flat but persistently elevated at 105, CBC without a leukocytosis, anemia noted to 12.5, initial metabolic panel revealed hyperkalemia to 6.9 with hemolysis noted, repeat read did reveal persistent hyperkalemia to 6.7 and therefore the patient was temporized with insulin dextrose, albuterol, Lokelma, calcium gluconate.  He was administered IV Dilaudid for pain control.  His chest x-ray revealed no acute cardiac or pulmonary abnormality.  He has no strokelike symptoms.  He is not septic.  Nephrology was consulted for emergent dialysis in the setting of the patient's ESRD and hyperkalemia, Dr. Juel Burrow will see the patient in consultation for emergent dialysis.  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  As noted in the above notes from [facility], the patient had been not deemed by CT surgery at [facility] to be a surgical candidate given lack of heart failure symptoms.  He had been covered with IV antibiotics per ID at [facility] with IV vancomycin and Rocephin.  Blood cultures  that showed no growth to date.  Blood cultures were ordered x 3 prior to antibiotic administration however the patient refused additional blood cultures and only 2 were obtained.  Infectious disease was consulted to following with the patient for medical management of the patient's endocarditis and HIV, Dr. Earlene Plater was consulted.  The patient was  informed of plan for admission and emergent dialysis tonight.  He remained fairly comfortable in the ER, will initiate empiric heparin in the setting of the patient's elevated troponin and chest discomfort.  CT surgery was consulted, full consultation and recommendations pending at time of signout.  Plan for admission at time of signout, medicine admission as the patient is hemodynamically stable not requiring the ICU. Signout given to Dr. Preston Fleeting at 2300.    Final Clinical Impression(s) / ED Diagnoses Final diagnoses:  Infective endocarditis, due to unspecified organism, unspecified chronicity  Hyperkalemia  ESRD (end stage renal disease)    Rx / DC Orders ED Discharge Orders     None         Ernie Avena, MD 03/01/23 2307

## 2023-03-01 NOTE — ED Notes (Signed)
K 6.9 with hemolysis per lab, Karene Fry, MD notified.

## 2023-03-01 NOTE — ED Notes (Signed)
Patient states he is walking outside, said he needs to speak to his wife in private, advised their was a phone in the room if he would please use that phone, pt states no he is going outside.  IV is in place, advised this is against policy.  PA notified, states she will tell Karene Fry, MD.

## 2023-03-01 NOTE — Progress Notes (Signed)
Pharmacy Antibiotic Note  [name] is a 62 y.o. male for which pharmacy has been consulted for vancomycin dosing for  endocarditis .  Patient with a history of ESRD on HD, HIV, HTN, DM, CAD with prior CABG in 2022, paroxysmal afib. Recently seen at [facility] for unstable angina and aortic valve endocarditis (4/3-11 left AMA; returned 4/13 but left AMA again).   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  Last HD 4/13 WBC 9.7; T 98.3; HR 65; RR 20  Plan: Vancomycin random on arrival to ED 16 mcg/ml, which is therapeutic --Receiving 1g during HD at [facility] --Will plan for resume same once HD plan in place for here Ceftriaxone per MD Trend WBC, Fever, Renal function F/u cultures, clinical course, WBC F/u ID recommendations F/u Nephrology plan  Height: 5\' 5"  (165.1 cm) Weight: 63.5 kg (140 lb) IBW/kg (Calculated) : 61.5  Temp (24hrs), Avg:98.3 F (36.8 C), Min:98.3 F (36.8 C), Max:98.3 F (36.8 C)  Recent Labs  Lab 03/01/23 1730  WBC 6.7  CREATININE 8.46*    Estimated Creatinine Clearance: 8 mL/min (A) (by C-G formula based on SCr of 8.46 mg/dL (H)).    Allergies  Allergen Reactions   Penicillins Anaphylaxis    Has patient had a PCN reaction causing immediate rash, facial/tongue/throat swelling, SOB or lightheadedness with hypotension: Unknown Has patient had a PCN reaction causing severe rash involving mucus membranes or skin necrosis: Unknown Has patient had a PCN reaction that required hospitalization: Unknown Has patient had a PCN reaction occurring within the last 10 years: No If all of the above answers are "NO", then may proceed with Cephalosporin use.   Microbiology results: Pending  Thank you for allowing pharmacy to be a part of this patient's care.  Delmar Landau, PharmD, BCPS 03/01/2023 7:09 PM ED Clinical Pharmacist -  [phone]

## 2023-03-01 NOTE — ED Triage Notes (Signed)
Pt reports he was just discharged from [facility] this morning. Was admitted for CP and ABD, pt reports he thought he was doing better this morning and started feeling worse as the day went on. Pt reports hx of bipass. Pt appears uncomfortable. Tues, Thurs, Saturday dialysis. Last treatment yesterday.

## 2023-03-02 ENCOUNTER — Inpatient Hospital Stay (HOSPITAL_COMMUNITY)
Admission: EM | Admit: 2023-03-02 | Discharge: 2023-03-12 | Disposition: A | Discharge status: Home / Self Care | Payer: 59 | Source: Home / Self Care | Attending: Internal Medicine | Admitting: Internal Medicine

## 2023-03-02 ENCOUNTER — Encounter (HOSPITAL_COMMUNITY): Payer: Self-pay

## 2023-03-02 ENCOUNTER — Inpatient Hospital Stay (HOSPITAL_COMMUNITY): Payer: 59

## 2023-03-02 DIAGNOSIS — K219 Gastro-esophageal reflux disease without esophagitis: Secondary | ICD-10-CM | POA: Diagnosis present

## 2023-03-02 DIAGNOSIS — Y848 Other medical procedures as the cause of abnormal reaction of the patient, or of later complication, without mention of misadventure at the time of the procedure: Secondary | ICD-10-CM | POA: Diagnosis present

## 2023-03-02 DIAGNOSIS — N186 End stage renal disease: Secondary | ICD-10-CM | POA: Diagnosis present

## 2023-03-02 DIAGNOSIS — I214 Non-ST elevation (NSTEMI) myocardial infarction: Secondary | ICD-10-CM | POA: Diagnosis not present

## 2023-03-02 DIAGNOSIS — Z8572 Personal history of non-Hodgkin lymphomas: Secondary | ICD-10-CM

## 2023-03-02 DIAGNOSIS — I252 Old myocardial infarction: Secondary | ICD-10-CM

## 2023-03-02 DIAGNOSIS — T80211A Bloodstream infection due to central venous catheter, initial encounter: Secondary | ICD-10-CM | POA: Diagnosis present

## 2023-03-02 DIAGNOSIS — I5042 Chronic combined systolic (congestive) and diastolic (congestive) heart failure: Secondary | ICD-10-CM | POA: Diagnosis present

## 2023-03-02 DIAGNOSIS — Y828 Other medical devices associated with adverse incidents: Secondary | ICD-10-CM | POA: Diagnosis present

## 2023-03-02 DIAGNOSIS — I358 Other nonrheumatic aortic valve disorders: Secondary | ICD-10-CM | POA: Diagnosis present

## 2023-03-02 DIAGNOSIS — Z91199 Patient's noncompliance with other medical treatment and regimen due to unspecified reason: Secondary | ICD-10-CM

## 2023-03-02 DIAGNOSIS — R7881 Bacteremia: Secondary | ICD-10-CM | POA: Diagnosis present

## 2023-03-02 DIAGNOSIS — I480 Paroxysmal atrial fibrillation: Secondary | ICD-10-CM

## 2023-03-02 DIAGNOSIS — Z7902 Long term (current) use of antithrombotics/antiplatelets: Secondary | ICD-10-CM

## 2023-03-02 DIAGNOSIS — F03911 Unspecified dementia, unspecified severity, with agitation: Secondary | ICD-10-CM | POA: Diagnosis present

## 2023-03-02 DIAGNOSIS — Z888 Allergy status to other drugs, medicaments and biological substances status: Secondary | ICD-10-CM

## 2023-03-02 DIAGNOSIS — I330 Acute and subacute infective endocarditis: Secondary | ICD-10-CM | POA: Diagnosis not present

## 2023-03-02 DIAGNOSIS — I10 Essential (primary) hypertension: Secondary | ICD-10-CM | POA: Diagnosis not present

## 2023-03-02 DIAGNOSIS — Z955 Presence of coronary angioplasty implant and graft: Secondary | ICD-10-CM

## 2023-03-02 DIAGNOSIS — I38 Endocarditis, valve unspecified: Secondary | ICD-10-CM | POA: Insufficient documentation

## 2023-03-02 DIAGNOSIS — E875 Hyperkalemia: Secondary | ICD-10-CM | POA: Diagnosis present

## 2023-03-02 DIAGNOSIS — M898X9 Other specified disorders of bone, unspecified site: Secondary | ICD-10-CM | POA: Diagnosis present

## 2023-03-02 DIAGNOSIS — Z79899 Other long term (current) drug therapy: Secondary | ICD-10-CM

## 2023-03-02 DIAGNOSIS — Z951 Presence of aortocoronary bypass graft: Secondary | ICD-10-CM

## 2023-03-02 DIAGNOSIS — Z91148 Patient's other noncompliance with medication regimen for other reason: Secondary | ICD-10-CM

## 2023-03-02 DIAGNOSIS — I444 Left anterior fascicular block: Secondary | ICD-10-CM | POA: Diagnosis present

## 2023-03-02 DIAGNOSIS — Z992 Dependence on renal dialysis: Secondary | ICD-10-CM

## 2023-03-02 DIAGNOSIS — I339 Acute and subacute endocarditis, unspecified: Principal | ICD-10-CM | POA: Diagnosis present

## 2023-03-02 DIAGNOSIS — I420 Dilated cardiomyopathy: Secondary | ICD-10-CM | POA: Diagnosis present

## 2023-03-02 DIAGNOSIS — I5021 Acute systolic (congestive) heart failure: Secondary | ICD-10-CM | POA: Diagnosis not present

## 2023-03-02 DIAGNOSIS — Z87891 Personal history of nicotine dependence: Secondary | ICD-10-CM

## 2023-03-02 DIAGNOSIS — F32A Depression, unspecified: Secondary | ICD-10-CM | POA: Diagnosis present

## 2023-03-02 DIAGNOSIS — Z7901 Long term (current) use of anticoagulants: Secondary | ICD-10-CM

## 2023-03-02 DIAGNOSIS — R531 Weakness: Principal | ICD-10-CM

## 2023-03-02 DIAGNOSIS — N2581 Secondary hyperparathyroidism of renal origin: Secondary | ICD-10-CM | POA: Diagnosis present

## 2023-03-02 DIAGNOSIS — E871 Hypo-osmolality and hyponatremia: Secondary | ICD-10-CM | POA: Diagnosis present

## 2023-03-02 DIAGNOSIS — I132 Hypertensive heart and chronic kidney disease with heart failure and with stage 5 chronic kidney disease, or end stage renal disease: Secondary | ICD-10-CM | POA: Diagnosis present

## 2023-03-02 DIAGNOSIS — B20 Human immunodeficiency virus [HIV] disease: Secondary | ICD-10-CM

## 2023-03-02 DIAGNOSIS — I509 Heart failure, unspecified: Secondary | ICD-10-CM

## 2023-03-02 DIAGNOSIS — D631 Anemia in chronic kidney disease: Secondary | ICD-10-CM | POA: Diagnosis present

## 2023-03-02 DIAGNOSIS — Z880 Allergy status to penicillin: Secondary | ICD-10-CM

## 2023-03-02 DIAGNOSIS — G894 Chronic pain syndrome: Secondary | ICD-10-CM | POA: Diagnosis present

## 2023-03-02 DIAGNOSIS — Z8249 Family history of ischemic heart disease and other diseases of the circulatory system: Secondary | ICD-10-CM

## 2023-03-02 DIAGNOSIS — G253 Myoclonus: Secondary | ICD-10-CM | POA: Diagnosis not present

## 2023-03-02 DIAGNOSIS — Z7982 Long term (current) use of aspirin: Secondary | ICD-10-CM

## 2023-03-02 DIAGNOSIS — T827XXA Infection and inflammatory reaction due to other cardiac and vascular devices, implants and grafts, initial encounter: Secondary | ICD-10-CM

## 2023-03-02 DIAGNOSIS — I120 Hypertensive chronic kidney disease with stage 5 chronic kidney disease or end stage renal disease: Secondary | ICD-10-CM | POA: Diagnosis not present

## 2023-03-02 DIAGNOSIS — E785 Hyperlipidemia, unspecified: Secondary | ICD-10-CM | POA: Diagnosis present

## 2023-03-02 DIAGNOSIS — M7989 Other specified soft tissue disorders: Secondary | ICD-10-CM | POA: Diagnosis not present

## 2023-03-02 DIAGNOSIS — F0393 Unspecified dementia, unspecified severity, with mood disturbance: Secondary | ICD-10-CM | POA: Diagnosis present

## 2023-03-02 DIAGNOSIS — J449 Chronic obstructive pulmonary disease, unspecified: Secondary | ICD-10-CM | POA: Diagnosis present

## 2023-03-02 DIAGNOSIS — K611 Rectal abscess: Secondary | ICD-10-CM | POA: Diagnosis present

## 2023-03-02 DIAGNOSIS — I440 Atrioventricular block, first degree: Secondary | ICD-10-CM | POA: Diagnosis present

## 2023-03-02 DIAGNOSIS — I2510 Atherosclerotic heart disease of native coronary artery without angina pectoris: Secondary | ICD-10-CM | POA: Diagnosis present

## 2023-03-02 DIAGNOSIS — E876 Hypokalemia: Secondary | ICD-10-CM | POA: Diagnosis present

## 2023-03-02 LAB — CBC
HCT: 39.1 % (ref 39.0–52.0)
Hemoglobin: 11.6 g/dL — ABNORMAL LOW (ref 13.0–17.0)
MCH: 27.4 pg (ref 26.0–34.0)
MCHC: 29.7 g/dL — ABNORMAL LOW (ref 30.0–36.0)
MCV: 92.4 fL (ref 80.0–100.0)
Platelets: 228 10*3/uL (ref 150–400)
RBC: 4.23 MIL/uL (ref 4.22–5.81)
RDW: 19.7 % — ABNORMAL HIGH (ref 11.5–15.5)
WBC: 7.2 10*3/uL (ref 4.0–10.5)
nRBC: 0 % (ref 0.0–0.2)

## 2023-03-02 LAB — BASIC METABOLIC PANEL
Anion gap: 15 (ref 5–15)
BUN: 63 mg/dL — ABNORMAL HIGH (ref 8–23)
CO2: 23 mmol/L (ref 22–32)
Calcium: 9.6 mg/dL (ref 8.9–10.3)
Chloride: 96 mmol/L — ABNORMAL LOW (ref 98–111)
Creatinine, Ser: 8.46 mg/dL — ABNORMAL HIGH (ref 0.61–1.24)
GFR, Estimated: 7 mL/min — ABNORMAL LOW (ref >60)
Glucose, Bld: 86 mg/dL (ref 70–99)
Potassium: 6.3 mmol/L (ref 3.5–5.1)
Sodium: 134 mmol/L — ABNORMAL LOW (ref 135–145)

## 2023-03-02 LAB — RENAL FUNCTION PANEL
Albumin: 3.1 g/dL — ABNORMAL LOW (ref 3.5–5.0)
Anion gap: 19 — ABNORMAL HIGH (ref 5–15)
BUN: 64 mg/dL — ABNORMAL HIGH (ref 8–23)
CO2: 20 mmol/L — ABNORMAL LOW (ref 22–32)
Calcium: 8.4 mg/dL — ABNORMAL LOW (ref 8.9–10.3)
Chloride: 95 mmol/L — ABNORMAL LOW (ref 98–111)
Creatinine, Ser: 8.39 mg/dL — ABNORMAL HIGH (ref 0.61–1.24)
GFR, Estimated: 7 mL/min — ABNORMAL LOW (ref >60)
Glucose, Bld: 77 mg/dL (ref 70–99)
Phosphorus: 5.4 mg/dL — ABNORMAL HIGH (ref 2.5–4.6)
Potassium: 5.5 mmol/L — ABNORMAL HIGH (ref 3.5–5.1)
Sodium: 134 mmol/L — ABNORMAL LOW (ref 135–145)

## 2023-03-02 LAB — CULTURE, BLOOD (ROUTINE X 2)
Culture: NO GROWTH
Special Requests: ADEQUATE

## 2023-03-02 MED ORDER — SENNOSIDES-DOCUSATE SODIUM 8.6-50 MG PO TABS: 1.0000 | ORAL_TABLET | Freq: Every evening | ORAL | Status: DC | PRN

## 2023-03-02 MED ORDER — CLOPIDOGREL BISULFATE 75 MG PO TABS: 75.0000 mg | ORAL_TABLET | Freq: Every day | ORAL | Status: DC

## 2023-03-02 MED ORDER — DOLUTEGRAVIR SODIUM 50 MG PO TABS
50.0000 mg | ORAL_TABLET | Freq: Every day | ORAL | Status: DC
  Filled 2023-03-02: qty 1

## 2023-03-02 MED ORDER — MIRTAZAPINE 15 MG PO TABS
7.5000 mg | ORAL_TABLET | Freq: Every day | ORAL | Status: DC
  Administered 2023-03-02 – 2023-03-11 (×10): 7.5 mg via ORAL
  Filled 2023-03-02 (×10): qty 1

## 2023-03-02 MED ORDER — LIDOCAINE HCL (PF) 1 % IJ SOLN: 5.0000 mL | INTRAMUSCULAR | Status: DC | PRN

## 2023-03-02 MED ORDER — LISINOPRIL 5 MG PO TABS
10.0000 mg | ORAL_TABLET | Freq: Every day | ORAL | Status: DC
  Administered 2023-03-07 – 2023-03-08 (×2): 10 mg via ORAL
  Filled 2023-03-02 (×3): qty 2
  Filled 2023-03-02: qty 1
  Filled 2023-03-02 (×2): qty 2

## 2023-03-02 MED ORDER — PENTAFLUOROPROP-TETRAFLUOROETH EX AERO: 1.0000 | INHALATION_SPRAY | CUTANEOUS | Status: DC | PRN

## 2023-03-02 MED ORDER — FAMOTIDINE 20 MG PO TABS: 40.0000 mg | ORAL_TABLET | Freq: Every day | ORAL | Status: DC

## 2023-03-02 MED ORDER — ALBUTEROL SULFATE (2.5 MG/3ML) 0.083% IN NEBU: 2.5000 mg | INHALATION_SOLUTION | RESPIRATORY_TRACT | Status: DC | PRN

## 2023-03-02 MED ORDER — ACETAMINOPHEN 650 MG RE SUPP: 650.0000 mg | Freq: Four times a day (QID) | RECTAL | Status: DC | PRN

## 2023-03-02 MED ORDER — VANCOMYCIN HCL IN DEXTROSE 1-5 GM/200ML-% IV SOLN: 1000.0000 mg | INTRAVENOUS | Status: DC

## 2023-03-02 MED ORDER — HYDRALAZINE HCL 50 MG PO TABS
100.0000 mg | ORAL_TABLET | Freq: Three times a day (TID) | ORAL | Status: DC
  Administered 2023-03-02 – 2023-03-08 (×10): 100 mg via ORAL
  Filled 2023-03-02 (×15): qty 2

## 2023-03-02 MED ORDER — MIRTAZAPINE 15 MG PO TABS: 7.5000 mg | ORAL_TABLET | Freq: Every day | ORAL | Status: DC

## 2023-03-02 MED ORDER — SEVELAMER CARBONATE 800 MG PO TABS: 1600.0000 mg | ORAL_TABLET | ORAL | Status: DC

## 2023-03-02 MED ORDER — HEPARIN (PORCINE) 25000 UT/250ML-% IV SOLN
900.0000 [IU]/h | INTRAVENOUS | Status: DC
  Administered 2023-03-02: 900 [IU]/h via INTRAVENOUS
  Filled 2023-03-02: qty 250

## 2023-03-02 MED ORDER — ISOSORBIDE MONONITRATE ER 60 MG PO TB24
60.0000 mg | ORAL_TABLET | Freq: Every day | ORAL | Status: DC
  Administered 2023-03-03 – 2023-03-08 (×6): 60 mg via ORAL
  Filled 2023-03-02 (×6): qty 1

## 2023-03-02 MED ORDER — ANTICOAGULANT SODIUM CITRATE 4% (200MG/5ML) IV SOLN: 5.0000 mL | Status: DC | PRN

## 2023-03-02 MED ORDER — TRAMADOL HCL 50 MG PO TABS
50.0000 mg | ORAL_TABLET | Freq: Two times a day (BID) | ORAL | Status: DC | PRN
  Administered 2023-03-02 – 2023-03-12 (×6): 50 mg via ORAL
  Filled 2023-03-02 (×6): qty 1

## 2023-03-02 MED ORDER — ATORVASTATIN CALCIUM 40 MG PO TABS: 80.0000 mg | ORAL_TABLET | Freq: Every day | ORAL | Status: DC

## 2023-03-02 MED ORDER — SODIUM CHLORIDE 0.9% FLUSH: 3.0000 mL | Freq: Two times a day (BID) | INTRAVENOUS | Status: DC

## 2023-03-02 MED ORDER — HYDRALAZINE HCL 100 MG PO TABS: 100.0000 mg | ORAL_TABLET | Freq: Three times a day (TID) | ORAL | Status: DC

## 2023-03-02 MED ORDER — CARVEDILOL 12.5 MG PO TABS: 25.0000 mg | ORAL_TABLET | Freq: Two times a day (BID) | ORAL | Status: DC

## 2023-03-02 MED ORDER — CARVEDILOL 12.5 MG PO TABS: 12.5000 mg | ORAL_TABLET | Freq: Two times a day (BID) | ORAL | Status: DC

## 2023-03-02 MED ORDER — ONDANSETRON HCL 4 MG PO TABS
4.0000 mg | ORAL_TABLET | Freq: Four times a day (QID) | ORAL | Status: DC | PRN
  Administered 2023-03-02: 4 mg via ORAL
  Filled 2023-03-02: qty 1

## 2023-03-02 MED ORDER — RANOLAZINE ER 500 MG PO TB12: 500.0000 mg | ORAL_TABLET | Freq: Two times a day (BID) | ORAL | Status: DC

## 2023-03-02 MED ORDER — OXYCODONE HCL 5 MG PO TABS: 10.0000 mg | ORAL_TABLET | Freq: Two times a day (BID) | ORAL | Status: DC | PRN

## 2023-03-02 MED ORDER — ONDANSETRON HCL 4 MG/2ML IJ SOLN: 4.0000 mg | Freq: Four times a day (QID) | INTRAMUSCULAR | Status: DC | PRN

## 2023-03-02 MED ORDER — ACETAMINOPHEN 325 MG PO TABS
650.0000 mg | ORAL_TABLET | Freq: Four times a day (QID) | ORAL | Status: DC | PRN
  Administered 2023-03-06 – 2023-03-12 (×4): 650 mg via ORAL
  Filled 2023-03-02 (×5): qty 2

## 2023-03-02 MED ORDER — APIXABAN 5 MG PO TABS
5.0000 mg | ORAL_TABLET | Freq: Two times a day (BID) | ORAL | Status: DC
  Administered 2023-03-02 – 2023-03-05 (×7): 5 mg via ORAL
  Filled 2023-03-02 (×8): qty 1

## 2023-03-02 MED ORDER — ISOSORBIDE MONONITRATE ER 30 MG PO TB24: 60.0000 mg | ORAL_TABLET | Freq: Every day | ORAL | Status: DC

## 2023-03-02 MED ORDER — OXYCODONE HCL 5 MG PO TABS
10.0000 mg | ORAL_TABLET | Freq: Four times a day (QID) | ORAL | Status: DC | PRN
  Administered 2023-03-02: 10 mg via ORAL
  Filled 2023-03-02: qty 2

## 2023-03-02 MED ORDER — ACETAMINOPHEN 325 MG PO TABS: 650.0000 mg | ORAL_TABLET | Freq: Four times a day (QID) | ORAL | Status: DC | PRN

## 2023-03-02 MED ORDER — CLOPIDOGREL BISULFATE 75 MG PO TABS
75.0000 mg | ORAL_TABLET | Freq: Every day | ORAL | Status: DC
  Administered 2023-03-03 – 2023-03-12 (×10): 75 mg via ORAL
  Filled 2023-03-02 (×11): qty 1

## 2023-03-02 MED ORDER — ONDANSETRON HCL 4 MG PO TABS: 4.0000 mg | ORAL_TABLET | Freq: Four times a day (QID) | ORAL | Status: DC | PRN

## 2023-03-02 MED ORDER — HEPARIN SODIUM (PORCINE) 1000 UNIT/ML DIALYSIS: 1000.0000 [IU] | INTRAMUSCULAR | Status: DC | PRN

## 2023-03-02 MED ORDER — SEVELAMER CARBONATE 800 MG PO TABS: 2400.0000 mg | ORAL_TABLET | Freq: Three times a day (TID) | ORAL | Status: DC

## 2023-03-02 MED ORDER — IPRATROPIUM-ALBUTEROL 0.5-2.5 (3) MG/3ML IN SOLN: 3.0000 mL | Freq: Three times a day (TID) | RESPIRATORY_TRACT | Status: DC

## 2023-03-02 MED ORDER — CARVEDILOL 25 MG PO TABS
25.0000 mg | ORAL_TABLET | Freq: Two times a day (BID) | ORAL | Status: DC
  Administered 2023-03-02 – 2023-03-08 (×11): 25 mg via ORAL
  Filled 2023-03-02 (×2): qty 1
  Filled 2023-03-02: qty 2
  Filled 2023-03-02 (×4): qty 1
  Filled 2023-03-02: qty 2
  Filled 2023-03-02 (×3): qty 1

## 2023-03-02 MED ORDER — OXYCODONE HCL 5 MG PO TABS
10.0000 mg | ORAL_TABLET | Freq: Three times a day (TID) | ORAL | Status: DC | PRN
  Administered 2023-03-02 – 2023-03-12 (×16): 10 mg via ORAL
  Filled 2023-03-02 (×16): qty 2

## 2023-03-02 MED ORDER — LIDOCAINE-PRILOCAINE 2.5-2.5 % EX CREA: 1.0000 | TOPICAL_CREAM | CUTANEOUS | Status: DC | PRN

## 2023-03-02 MED ORDER — ALTEPLASE 2 MG IJ SOLR: 2.0000 mg | Freq: Once | INTRAMUSCULAR | Status: DC | PRN

## 2023-03-02 MED ORDER — GABAPENTIN 300 MG PO CAPS: 300.0000 mg | ORAL_CAPSULE | Freq: Three times a day (TID) | ORAL | Status: DC

## 2023-03-02 MED ORDER — AMIODARONE HCL 200 MG PO TABS: 200.0000 mg | ORAL_TABLET | Freq: Every day | ORAL | Status: DC

## 2023-03-02 MED ORDER — ATORVASTATIN CALCIUM 80 MG PO TABS
80.0000 mg | ORAL_TABLET | Freq: Every day | ORAL | Status: DC
  Administered 2023-03-03 – 2023-03-12 (×10): 80 mg via ORAL
  Filled 2023-03-02: qty 1
  Filled 2023-03-02: qty 2
  Filled 2023-03-02 (×9): qty 1

## 2023-03-02 MED ORDER — HYDROMORPHONE HCL 1 MG/ML IJ SOLN
0.5000 mg | INTRAMUSCULAR | Status: DC | PRN
  Administered 2023-03-02 – 2023-03-11 (×18): 0.5 mg via INTRAVENOUS
  Filled 2023-03-02 (×3): qty 0.5
  Filled 2023-03-02: qty 1
  Filled 2023-03-02 (×4): qty 0.5
  Filled 2023-03-02: qty 1
  Filled 2023-03-02: qty 0.5
  Filled 2023-03-02: qty 1
  Filled 2023-03-02 (×7): qty 0.5

## 2023-03-02 MED ORDER — MINOXIDIL 2.5 MG PO TABS
2.5000 mg | ORAL_TABLET | Freq: Every day | ORAL | Status: DC
  Filled 2023-03-02: qty 1

## 2023-03-02 MED ORDER — RILPIVIRINE HCL 25 MG PO TABS: 25.0000 mg | ORAL_TABLET | Freq: Every day | ORAL | Status: DC

## 2023-03-02 MED ORDER — TRAZODONE HCL 50 MG PO TABS
25.0000 mg | ORAL_TABLET | Freq: Every evening | ORAL | Status: DC | PRN
  Administered 2023-03-02 – 2023-03-11 (×4): 25 mg via ORAL
  Filled 2023-03-02 (×4): qty 1

## 2023-03-02 MED ORDER — AMITRIPTYLINE HCL 10 MG PO TABS
10.0000 mg | ORAL_TABLET | Freq: Every day | ORAL | Status: DC
  Administered 2023-03-02 – 2023-03-11 (×10): 10 mg via ORAL
  Filled 2023-03-02 (×11): qty 1

## 2023-03-02 MED ORDER — MINOXIDIL 10 MG PO TABS
10.0000 mg | ORAL_TABLET | Freq: Two times a day (BID) | ORAL | Status: DC
  Administered 2023-03-02 – 2023-03-08 (×10): 10 mg via ORAL
  Filled 2023-03-02 (×13): qty 1

## 2023-03-02 MED ORDER — AMITRIPTYLINE HCL 10 MG PO TABS
10.0000 mg | ORAL_TABLET | Freq: Every day | ORAL | Status: DC
  Filled 2023-03-02: qty 1

## 2023-03-02 MED ORDER — ASPIRIN 81 MG PO CHEW
81.0000 mg | CHEWABLE_TABLET | Freq: Every day | ORAL | Status: DC
  Administered 2023-03-03 – 2023-03-04 (×2): 81 mg via ORAL
  Filled 2023-03-02 (×3): qty 1

## 2023-03-02 NOTE — ED Triage Notes (Signed)
Pt left AMA from [facility] earlier- pt states he was put out. Pt walked from [facility] to [facility].  Pt states that he is having chest pain. 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 Pt believes that he had dialysis last night.

## 2023-03-02 NOTE — H&P (Signed)
History and Physical    Patient: [name] ZOX:[id number] DOB: 1961-09-24 DOA: 03/01/2023 DOS: the patient was seen and examined on 03/02/2023 PCP: [name], MD  Patient coming from: Home-primary residence ? [place] but visits girlfriend on WEs in [place]  Chief Complaint:  Chief Complaint  Patient presents with   Chest Pain   Abdominal Pain   HPI: [name] is a 62 y.o. 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  Of note patient has been admitted to [facility] over the past week beginning on 02/18/2023.  Signed out AMA on 4/11.  Readmitted again the same day on 411/24 and once again left AMA on 4/13.  He was readmitted again on 4/13 and left AMA on 4/14 which was this past Saturday.  During this timeframe he had an echocardiogram suggestive of subacute bacterial endocarditis involving the aortic valve.  He is underwent a TEE that demonstrated aortic valve small mobile vegetation with moderate AR.  Unable to adequately assess aortic stenosis.  Also the catheter tip in the right atrium also demonstrated a mobile vegetation.  Blood cultures from that same hospitalization on 4/6 and 4/7 have demonstrated no growth to date.  He had mycobacterium blood cultures performed on 4/12 with no growth noted.  In February 2024 he underwent left heart catheterization at [facility] and had a drug-eluting stent placed in the PDA due to in-stent restenosis.  It is unclear if the patient  has been taking his dual antiplatelet drugs since discharge.  He has presented to [facility] with complaints of chest pain and abdominal pain.  His vital signs were stable at presentation and he was afebrile.  He was initially evaluated by one of our nocturnal physicians and had initially refused admission after being asked on multiple occasions.  He was unable to wear that.  Hypokalemia, endocarditis and infected hemodialysis catheter but he continued to refuse admission.  He was later seen by a nephrologist patient did agree to at least undergo hemodialysis.  Subsequently after hemodialysis patient was brought back to the ER with he told the ED physician he was willing to be admitted.  Upon my examination of the patient while he was still in the dialysis suite he was alert and oriented x 3.  When asked about presenting symptomatology he was unable to characterize whether he was having any chest pain or shortness of breath or other symptoms just stating that "it came on real fast".  He had no neurological deficits although he did appear to have some deviation of the tongue to the right.  He was found to have a very loud diastolic murmur.  He denied any alcohol, tobacco or illicit drug use including injecting either into his extremities or into his TDC.  He had no other complaints.   Review of Systems: As mentioned in the history of present illness. All other systems reviewed and are negative. 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   Candidiasis of mouth    Cardiomyopathy (HCC)    Chest pain, unspecified    Diseases of lips    Angular cheilitis   HIV infection (HCC)    Hypertension    Lymphoma (HCC)    Personal history of unspecified disease of respiratory system    Pneumocystosis (HCC)    Shingles    Tobacco use    Past Surgical History:  Procedure Laterality Date   BASCILIC VEIN TRANSPOSITION Left 05/24/2018   Procedure:  FIRST STAGE BASCILIC VEIN TRANSPOSITION;  Surgeon: Larina Earthly, MD;  Location: [facility] OR;  Service: Vascular;  Laterality: Left;   IR FLUORO GUIDE CV LINE RIGHT  05/17/2018   IR US GUIDE VASC ACCESS RIGHT  05/17/2018   Social History:  reports that he quit smoking about 7 years ago. He has a 35.00 pack-year smoking history. He has never used smokeless tobacco. He reports current alcohol use. He reports that he does not use drugs.  Allergies  Allergen Reactions   Penicillins Anaphylaxis    Has patient had a PCN reaction causing immediate rash, facial/tongue/throat swelling, SOB or lightheadedness with hypotension: Unknown Has patient had a PCN reaction causing severe rash involving mucus membranes or skin necrosis: Unknown Has patient had a PCN reaction that required hospitalization: Unknown Has patient had a PCN reaction occurring within the last 10 years: No If all of the above answers are "NO", then may proceed with Cephalosporin use.    Family History  Problem Relation Age of Onset   Heart disease Mother    Diabetes Mother    Cancer Mother    Diabetes Sister    CAD Sister        ? stents   Diabetes Maternal Grandmother    Hypertension Brother     Prior to Admission medications   Medication Sig Start Date End Date Taking? Authorizing Provider  atorvastatin (LIPITOR) 80 MG tablet Take 80 mg by mouth daily. 01/04/23  Yes [provider]  clopidogrel (PLAVIX) 75 MG tablet Take 75 mg by mouth daily. 10/03/22  Yes [provider]  D3-50 1.25 MG (50000 UT) capsule Take 50,000 Units by mouth once a week. 01/27/23  Yes [provider]  ELIQUIS 5 MG TABS tablet Take 5 mg by mouth 2 (two) times daily. 05/17/22  Yes [provider]  famotidine (PEPCID) 40 MG tablet Take 40 mg by mouth daily. 01/04/23  Yes [provider]  hydrALAZINE (APRESOLINE) 100 MG tablet Take 100 mg by mouth 3 (three) times daily.   Yes [provider]  isosorbide  mononitrate (IMDUR) 60 MG 24 hr tablet Take 60 mg by mouth daily. 01/04/23  Yes [provider]  lisinopril (ZESTRIL) 10 MG tablet Take 10 mg by mouth daily. 02/16/23  Yes [provider]  methocarbamol (ROBAXIN) 500 MG tablet Take 500 mg by mouth 4 (four) times daily.   Yes [provider]  minoxidil (LONITEN) 10 MG tablet Take 10 mg by mouth 2 (two) times daily. 08/16/21  Yes [provider]  mirtazapine (REMERON) 7.5 MG tablet Take 7.5 mg by mouth at bedtime. 01/04/23  Yes [provider]  nitroGLYCERIN (NITROSTAT) 0.4 MG SL tablet Place 0.4 mg under the tongue every 5 (five) minutes as needed for chest pain. 04/27/19  Yes [provider]  Oxycodone HCl 10 MG TABS Take 10 mg by mouth every 8 (eight) hours as needed (for pain). 10/02/22  Yes [provider]  sevelamer carbonate (RENVELA)  800 MG tablet Take 1,600-2,400 mg by mouth See admin instructions. Take 3 tablets by mouth three times daily with meals and 2 tablets twice daily with snacks 12/04/21  Yes [provider]  amiodarone (PACERONE) 200 MG tablet Take 200 mg by mouth daily.    [provider]  amitriptyline (ELAVIL) 10 MG tablet Take 10 mg by mouth at bedtime. 12/04/21   [provider]  aspirin 81 MG chewable tablet Chew 1 tablet (81 mg total) by mouth daily. With Food 05/27/18   Shon Hale, MD  carvedilol (COREG) 12.5 MG tablet Take 1 tablet (12.5 mg total) by mouth 2 (two) times daily with a meal. Patient taking differently: Take 25 mg by mouth 2 (two) times daily with a meal. 05/26/18   Emokpae, Courage, MD  dapsone 100 MG tablet Take 1 tablet (100 mg total) by mouth daily. 05/26/18   Shon Hale, MD  dolutegravir (TIVICAY) 50 MG tablet Take 1 tablet (50 mg total) by mouth daily. 05/26/18   Shon Hale, MD  rilpivirine (EDURANT) 25 MG TABS tablet Take 1 tablet (25 mg total) by mouth daily with breakfast. 05/27/18   Shon Hale, MD     Physical Exam: Vitals:   03/02/23 0904 03/02/23 0908 03/02/23 0938 03/02/23 0945  BP: (!) 137/53 (!) 126/56 130/61 125/65  Pulse: 72 72 72 73  Resp: 11 16 18 15   Temp:  98 F (36.7 C)    TempSrc:  Oral    SpO2: 94% 92% 100% 99%  Weight:      Height:       Constitutional: NAD, calm, comfortable Eyes: PERRL, lids and conjunctivae normal ENMT: Mucous membranes are moist. Posterior pharynx clear of any exudate or lesions. Neck: normal, supple, no masses, no thyromegaly Respiratory: clear to auscultation bilaterally, no wheezing, no crackles. Normal respiratory effort. No accessory muscle use.  Room air Cardiovascular: Regular rate and rhythm, grade 4/5 diastolic murmur.  No rubs / gallops. No extremity edema. 2+ pedal pulses. No carotid bruits.  Abdomen: no tenderness, no masses palpated. No hepatosplenomegaly. Bowel sounds positive.  Musculoskeletal: no clubbing / cyanosis. No joint deformity upper and lower extremities. Good ROM, no contractures. Normal muscle tone.  Skin: no rashes, lesions, ulcers. No induration Neurologic: CN 2-12 grossly intact. Sensation intact,Strength 5/5 x all 4 extremities.  Psychiatric: Normal judgment and insight. Alert and oriented x 3. Normal mood.      Assessment and Plan: * Aortic valve endocarditis Pt with aortic valve endocarditis seen on transesophageal echocardiogram done at [facility] on 02/20/2023.  Patient also had a mobile vegetation noted on his hemodialysis catheter that was in his right atrium.   Patient has not had his hemodialysis catheter changed as he left AMA twice from [facility].  This cath was placed on 12/06/2021 according to outpatient records from [facility].  Initially this patient declined admission despite conversations regarding the severity of his illness.  Later after hemodialysis he agreed to admission. Recent blood cultures 1 week ago demonstrate no growth.  Unclear if patient has bacterial endocarditis or has  fungal and or other opportunistic infection causing the vegetations given he has known HIV disease with CD4 count less than 400 Will check fungal blood cultures and will need to culture tip of TDC catheter when removed for fungus as well Defer initiation of antimicrobial and antifungal agents to ID team **upon review of med list from [facility] patient was supposed to start taking dapsone 100 mg daily for 90 days  beginning on 02/27/2023   Hemodialysis catheter infection Patient had a mobile vegetation seen on his transesophageal echocardiogram at [facility] on 02/20/2023.  His hemodialysis catheter has not yet been changed out.   ESRD on hemodialysis with acute hyperkalemia EDP initially gave [facility] with subsequent potassium decreasing to 5.5 Nephrology has been consulted and patient subsequently underwent urgent hemodialysis Post HD renal function panel has been ordered Continue Renvela   Human immunodeficiency virus (HIV) disease (HCC) Last CD4 count was 182 on 02/26/2023 the reference range of [phone]. HIV diagnosed prior to 2019.  It is documented that the patient has had inconsistent follow-up and inconsistent consumption of prescribed medications Continue Tivicay and Edurant  HIV viral load from 02-18-2023 Ref Range & Units 12 d ago  HIV-1 RNA by  PCR, Quant Not Detected DETECTED Abnormal   HIV-1 8800 200 (Copies/ml) <=0 copies/mL <50 High   HIV-1 8800 200 (Log, Copies/mL) <=0.0 LOG copies/mL <1.7 High     Hypertension Blood pressure much better controlled after removal of excess volume during hemodialysis Continue minoxidil and carvedilol  PAF On amiodarone and Eliquis prior to admission  History of HFrEF with recovered EF Continue preadmission carvedilol Will repeat 2D echocardiogram although did have 1 week ago he has not been consistent in treatment and there could be some additional new findings.  S/P drug eluting coronary stent placement - 01-12-2023 at [facility] Had in-stent  restenosis and a drug-eluting stent placed in the PDA at [facility] 01/02/2023. Patient has chronic chest pain treated medically-continue Imdur and  Ranexa Continue Plavix and statin  Chronic pain Continue short acting Oxy IR, Elavil, Neurontin  COPD Continue albuterol as needed as well as Combivent  * I certify that at the point of admission it is my clinical judgment that the patient will require inpatient hospital care spanning beyond 2 midnights from the point of admission due to high intensity of service, high risk for further deterioration and high frequency of surveillance required.*  Author: Junious Silk, NP 03/02/2023 10:45 AM  For on call review [email or web address].

## 2023-03-02 NOTE — Assessment & Plan Note (Signed)
Will get HD today for his hyperkalemia

## 2023-03-02 NOTE — Progress Notes (Signed)
   03/02/23 0908  Vitals  Temp 98 F (36.7 C)  Temp Source Oral  BP (!) 126/56  MAP (mmHg) 78  BP Location Right Arm  BP Method Automatic  Patient Position (if appropriate) Lying  Pulse Rate 72  Pulse Rate Source Monitor  ECG Heart Rate 73  Resp 16  Oxygen Therapy  SpO2 92 %  O2 Device Nasal Cannula  O2 Flow Rate (L/min) 2 L/min  Patient Activity (if Appropriate) In bed  Pulse Oximetry Type Continuous  During Treatment Monitoring  Intra-Hemodialysis Comments Tx completed;Tolerated well  Post Treatment  Dialyzer Clearance Lightly streaked  Duration of HD Treatment -hour(s) 4 hour(s)  Liters Processed 96  Fluid Removed (mL) 2000 mL  Tolerated HD Treatment Yes  Hemodialysis Catheter Right Subclavian  No placement date or time found.   Orientation: Right  Access Location: Subclavian  Site Condition No complications  Blue Lumen Status Dead end cap in place;Heparin locked  Red Lumen Status Dead end cap in place;Heparin locked  Purple Lumen Status N/A  Catheter fill solution Heparin 1000 units/ml  Catheter fill volume (Arterial) 2.2 cc  Catheter fill volume (Venous) 2.4  Dressing Type Transparent  Dressing Status Antimicrobial disc in place;Clean, Dry, Intact  Interventions New dressing  Drainage Description None  Dressing Change Due 03/09/23  Post treatment catheter status Capped and Clamped

## 2023-03-02 NOTE — Consult Note (Signed)
Hospitalist Consultation History and Physical    [name] OZH:[id number] DOB: 28-Feb-1961 DOA: 03/01/2023  DOS: the patient was seen and examined on 03/01/2023  PCP: [name], MD   Patient coming from:  unsure. Pt just left AMA from [facility]. Pt lives in [place], [place] from [place], [place]  I have personally briefly reviewed patient's old medical records in [facility]  CC: abd pain, chest pain HPI: 62 year old African-American male history of HIV, end-stage renal disease on hemodialysis, coronary artery disease, tobacco abuse, chronic pain, presents to the ER today with complaints of chest pain and abdominal pain.  Patient has been admitted multiple times to [facility] over the last week.  His first admission was on 02/18/2023.  He signed out AMA on 02/26/2023.  He was readmitted again that same day on 02-26-2023 and left AMA again on 4- 13-2024.  He was readmitted again on 02/28/2023 and then left AMA again on 03/01/2023.  During one of his hospitalizations he had a transesophageal echocardiogram that showed aortic valve vegetations along with a mobile vegetation on his hemodialysis catheter.  Fortunately none of his blood cultures grew out any bacteria.  His Karius profile was sent but results are not yet resulted.  Blood cultures from 02/21/2023 and 02/22/2023 are negative.  Patient had Mycobacterium blood cultures performed on 02/27/2023.  They are still no growth to date.  Upon review of his EMR, he had a left heart catheterization at [facility] on 01/02/2023.  Patient had a drug-eluting stent placed in the PDA due to in-stent restenosis.  Unclear if the patient is been taking dual antiplatelet therapy since his discharge.  Patient presented to [facility], ER today with complaints of chest pain and abdominal pain.  On arrival temp 90.3 heart rate 65 blood pressure 125/54 satting 95% on room air.  Labs showed a white count of 6.7, hemoglobin  12.5, platelet 219  Sodium 134, potassium 6.9, bicarb of 24, BUN of 60, creatinine 8.46  Patient's hyperkalemia was treated with albuterol nebs, 5 units of IV insulin, IV dextrose, 10 g of p.o. Lokelma, 1 amp of calcium gluconate.  Nephrology was consulted for emergent dialysis.  [facility] hospitalist contacted for admission.  Upon entering the room, the patient was laying sideways on the ER gurney with his legs on the floor, and his upper back still on the ER gurney.  After waking the patient up and having him sit on the side of the bed, patient states that he does not want to be admitted to the hospital.  I asked him multiple times he gave him the reasons why he needed to be admitted to the hospital including his hyperkalemia, endocarditis and is infected hemodialysis catheter.  Patient continued to refuse admission.  Patient's bedside nurse was brought in the room.  She asked the same questions.  He continued to refuse hospital admission.  Discussed this with the EDP.  Patient will need to sign out AMA.   ED Course: K 6.9. EKG shows hyperacute T waves. 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   Candidiasis of mouth    Cardiomyopathy (HCC)    Chest pain, unspecified    Diseases of lips    Angular cheilitis  HIV infection (HCC)    Hypertension    Lymphoma (HCC)    Personal history of unspecified disease of respiratory system    Pneumocystosis (HCC)    Shingles    Tobacco use     Past Surgical History:  Procedure Laterality Date   BASCILIC VEIN TRANSPOSITION Left 05/24/2018   Procedure: FIRST STAGE BASCILIC VEIN TRANSPOSITION;  Surgeon: Larina Earthly, MD;  Location: [facility] OR;  Service: Vascular;  Laterality: Left;   IR FLUORO GUIDE CV LINE RIGHT  05/17/2018   IR US GUIDE VASC ACCESS RIGHT  05/17/2018      reports that he quit smoking about 7 years ago. He has a 35.00 pack-year smoking history. He has never used smokeless tobacco. He reports current alcohol use. He reports that he does not use drugs.  Allergies  Allergen Reactions   Penicillins Anaphylaxis    Has patient had a PCN reaction causing immediate rash, facial/tongue/throat swelling, SOB or lightheadedness with hypotension: Unknown Has patient had a PCN reaction causing severe rash involving mucus membranes or skin necrosis: Unknown Has patient had a PCN reaction that required hospitalization: Unknown Has patient had a PCN reaction occurring within the last 10 years: No If all of the above answers are "NO", then may proceed with Cephalosporin use.    Family History  Problem Relation Age of Onset   Heart disease Mother    Diabetes Mother    Cancer Mother    Diabetes Sister    CAD Sister        ? stents   Diabetes Maternal Grandmother    Hypertension Brother     Prior to Admission medications   Medication Sig Start Date End Date Taking? Authorizing Provider  amLODipine (NORVASC) 10 MG tablet Take 1 tablet (10 mg total) by mouth at bedtime. 05/26/18   Shon Hale, MD  aspirin 81 MG chewable tablet Chew 1 tablet (81 mg total) by mouth daily. With Food 05/27/18   Shon Hale, MD  azithromycin (ZITHROMAX) 600 MG tablet Take 2 tablets (1,200 mg total) by mouth once a week. 06/01/18   Shon Hale, MD  bisacodyl (DULCOLAX) 10 MG suppository Place 1 suppository (10 mg total) rectally daily as needed for moderate constipation. 05/26/18   Shon Hale, MD  calcium acetate (PHOSLO) 667 MG capsule Take 3 capsules (2,001 mg total) by mouth 3 (three) times daily with meals. 05/26/18   Shon Hale, MD  carvedilol (COREG) 12.5 MG tablet Take 1 tablet (12.5 mg total) by mouth 2 (two) times daily with a meal. 05/26/18   Emokpae, Courage, MD  dapsone 100 MG tablet Take 1 tablet (100 mg total) by mouth daily. 05/26/18   Emokpae,  Courage, MD  darunavir-cobicistat (PREZCOBIX) 800-150 MG tablet Take 1 tablet by mouth daily with breakfast. Swallow whole. Do NOT crush, break or chew tablets. Take with food. 05/26/18   Shon Hale, MD  dolutegravir (TIVICAY) 50 MG tablet Take 1 tablet (50 mg total) by mouth daily. 05/26/18   Shon Hale, MD  fluconazole (DIFLUCAN) 100 MG tablet one tablet each week (Thursdays) 05/26/18   Emokpae, Courage, MD  ondansetron (ZOFRAN) 4 MG tablet Take 1 tablet (4 mg total) by mouth every 6 (six) hours as needed for nausea. 05/26/18   Shon Hale, MD  polyethylene glycol (MIRALAX / GLYCOLAX) packet Take 17 g by mouth daily. 05/26/18   Shon Hale, MD  rilpivirine (EDURANT) 25 MG TABS tablet Take 1 tablet (25 mg total) by mouth daily with breakfast. 05/27/18  Shon Hale, MD    Physical Exam: Vitals:   03/01/23 2032 03/01/23 2200 03/01/23 2252 03/02/23 0000  BP:  (!) 143/62 (!) 141/68 117/65  Pulse:    65  Resp: 19 10 19  (!) 22  Temp: 97.9 F (36.6 C)   97.9 F (36.6 C)  TempSrc: Oral   Oral  SpO2: 95%   96%  Weight:      Height:        Physical Exam Vitals and nursing note reviewed.  Constitutional:      General: He is not in acute distress.    Appearance: He is not ill-appearing, toxic-appearing or diaphoretic.  HENT:     Head: Normocephalic and atraumatic.     Nose: Nose normal.  Cardiovascular:     Rate and Rhythm: Normal rate and regular rhythm.     Pulses: Normal pulses.     Heart sounds: Murmur heard.  Pulmonary:     Effort: Pulmonary effort is normal.     Breath sounds: Normal breath sounds.  Abdominal:     General: Abdomen is flat. Bowel sounds are normal.     Palpations: Abdomen is soft.  Musculoskeletal:     Right lower leg: No edema.     Left lower leg: No edema.     Comments: Right ant chest wall HD catheter  Skin:    General: Skin is warm and dry.     Capillary Refill: Capillary refill takes less than 2 seconds.  Neurological:      General: No focal deficit present.     Mental Status: He is alert and oriented to person, place, and time.     Comments: Knows that he is at [facility]. That it is April 2024. He knows that he was at [facility] hospital earlier today and came to [facility]  Psychiatric:        Behavior: Behavior is agitated and aggressive.      Labs on Admission: I have personally reviewed following labs and imaging studies  CBC: Recent Labs  Lab 03/01/23 1730  WBC 6.7  NEUTROABS 3.3  HGB 12.5*  HCT 40.4  MCV 91.2  PLT 219   Basic Metabolic Panel: Recent Labs  Lab 03/01/23 1730 03/01/23 2000 03/01/23 2250  NA 134* 136  135 134*  K 6.9* 6.7*  6.5* 6.3*  CL 92* 94*  94* 96*  CO2 24 23  26 23   GLUCOSE 106* 104*  108* 86  BUN 60* 63*  62* 63*  CREATININE 8.46* 8.75*  8.76* 8.46*  CALCIUM 9.2 9.2  9.1 9.6   GFR: Estimated Creatinine Clearance: 8 mL/min (A) (by C-G formula based on SCr of 8.46 mg/dL (H)). Liver Function Tests: Recent Labs  Lab 03/01/23 1730 03/01/23 2000  AST 39 46*  ALT 24 25  ALKPHOS 76 83  BILITOT 1.0 0.9  PROT 8.2* 8.3*  ALBUMIN 3.3* 3.4*   Cardiac Enzymes: Recent Labs  Lab 03/01/23 1730 03/01/23 2000  TROPONINIHS 86* 105*    Radiological Exams on Admission: I have personally reviewed images DG Chest Portable 1 View  Result Date: 03/01/2023 CLINICAL DATA:  Chest pain. EXAM: PORTABLE CHEST 1 VIEW COMPARISON:  05/10/2018 FINDINGS: Mild cardiomegaly is seen. Prior CABG noted. Right jugular dual-lumen central venous catheter is seen with TIPS overlying the right atrium. No evidence of pneumothorax. Both lungs clear.  No evidence of pleural effusion. IMPRESSION: Mild cardiomegaly. No active lung disease. Electronically Signed   By: Dietrich Pates.D.  On: 03/01/2023 17:50    EKG: My personal interpretation of EKG shows: NSR, hyperpeaked T waves    Assessment/Plan Principal Problem:   Aortic valve endocarditis Active Problems:   Hemodialysis  catheter infection   Hyperkalemia   Human immunodeficiency virus (HIV) disease (HCC)   ESRD on hemodialysis   S/P drug eluting coronary stent placement - 01-12-2023 at [facility]    Assessment and Plan: * Aortic valve endocarditis Pt with aortic valve endocarditis seen on transesophageal echocardiogram done at [facility] on 02/20/2023.  Patient also had a mobile vegetation noted on his hemodialysis catheter that was in his right atrium.  Patient has not had his hemodialysis catheter changed as he left AMA twice from [facility] medical center.  Patient declines hospitalization here despite multiple conversations with him.  This was witnessed by his RN. RN will have patient sign AMA paperwork.  Hyperkalemia Nephrology was consulted.  Patient should be undergoing emergent dialysis soon.  Hemodialysis catheter infection Patient had a mobile vegetation seen on his transesophageal echocardiogram at [facility] on 02/20/2023.  His hemodialysis catheter has not yet been changed out.  ESRD on hemodialysis Will get HD today for his hyperkalemia  Human immunodeficiency virus (HIV) disease (HCC) Last CD4 count was 182 on 02/26/2023 the reference range of [phone].  HIV viral load from 02-18-2023 Ref Range & Units 12 d ago  HIV-1 RNA by  PCR, Quant Not Detected DETECTED Abnormal   HIV-1 8800 200 (Copies/ml) <=0 copies/mL <50 High   HIV-1 8800 200 (Log, Copies/mL) <=0.0 LOG copies/mL <1.7 High     S/P drug eluting coronary stent placement - 01-12-2023 at [facility] Had in-stent restenosis and a drug-eluting stent placed in the PDA at [facility] 01/02/2023.    Family Communication: pt states he has no family or friends he wants to place on his contact list  Disposition Plan: pt refusing hospitalization. He will need to sign out AMA  Consults called: EDP has consulted nephrology  Admission status:  pt refusing admission ,  he will need to sign out AMA. EDP aware.   Carollee Herter, DO [facility] 03/02/2023, 12:32 AM

## 2023-03-02 NOTE — Consult Note (Signed)
Reason for Consult: ESRD Referring Physician: Dr. Ernie Avena  Chief Complaint: chest pain  Assessment/Plan: ESRD secondary to poorly controlled HTN and HIV; per pt he has had access placed in the left arm with the last time his heart stopping.  Seen on HD on 2K bath with CP, refusing nitroglycerin. Will turn off UF temporarily to see if CP improves. Pt angry and states that he has intermittent CP all the time. Difficult as he lives in [place] and still wants to maintain that center as the primary but also states that his girlfriend lives up [place] and he will be coming frequently.  HIV- continue RPV per ID recs. 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 Uncontrolled HTN- improved with meds + should improve with UF Anemia of chronic disease- ESA as needed; no IV iron with possible active infection Vascular access- RIJ TDC. Disposition- will be difficult as he only wants to come to [place] area on weekends occasionally because his GF lives here; he still wants to have [facility] as his primary center.  HPI: [name] is an 62 y.o. 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 Patient was actually initially transferred to [facility] from OSH for unstable angina and aortic valve endocarditis.  He was noted to have a TEE which revealed aortic valve vegetations and a mobile vegetation on his dialysis catheter. Blood cultures have been negative to date.   Patient per review of chart showed disruptive behaviors including pain medication seeking behavior and again signed out AMA 3x from [facility] with either refusing to give up his home nitroglycerin or refusing IV access, being verbally aggressive.  Patient now presenting with chest pain and abdominal pain. Bp was  125/54 with sats of 95% on RA and WBC 6.7, Hb 12.5 BUN/Cr 60/8.46. Patient was also noted to be hyperkalemic at 6.9 and treated with protocol.   ROS Pertinent items are noted in HPI.  Chemistry and CBC: Creat  Date/Time Value Ref Range Status  04/23/2017 05:28 PM 0.67 (L) 0.70 - 1.33 mg/dL Final    Comment:      For patients > or = 62 years of age: The upper reference limit for Creatinine is approximately 13% higher for people identified as African-American.     09/14/2014 09:47 AM 0.76 0.50 - 1.35 mg/dL Final  09/13/2535 64:40 AM 0.77 0.50 - 1.35 mg/dL Final  34/74/2595 63:87 AM 0.84 0.50 - 1.35 mg/dL Final   Creatinine, Ser  Date/Time Value Ref Range Status  03/02/2023 04:15 AM 8.39 (H) 0.61 - 1.24 mg/dL Final  56/43/3295 18:84 PM 8.46 (H) 0.61 - 1.24 mg/dL Final  16/60/6301 60:10 PM 8.76 (H) 0.61 - 1.24 mg/dL Final  93/23/5573 22:02 PM 8.75 (H) 0.61 - 1.24 mg/dL Final  54/27/0623 76:28 PM 8.46 (H) 0.61 - 1.24 mg/dL Final  31/51/7616 07:37 PM 5.48 (H) 0.61 - 1.24 mg/dL Final  10/62/6948 54:62 PM 7.52 (H) 0.61 - 1.24 mg/dL Final  70/35/0093 81:82 AM 6.79 (H) 0.61 - 1.24 mg/dL Final  99/37/1696 78:93 AM 7.22 (H) 0.61 - 1.24 mg/dL Final  81/11/7508 25:85 AM 6.55 (H) 0.61 - 1.24 mg/dL Final  27/78/2423 53:61 AM 7.34 (H) 0.61 - 1.24 mg/dL Final  44/31/5400 86:76 AM 8.16 (H) 0.61 - 1.24  mg/dL Final  40/98/1191 47:82 AM 8.15 (H) 0.61 - 1.24 mg/dL Final  95/62/1308 65:78 AM 8.04 (H) 0.61 - 1.24 mg/dL Final  46/96/2952 84:13 AM 7.80 (H) 0.61 - 1.24 mg/dL Final  24/40/1027 25:36 AM 7.85 (H) 0.61 - 1.24 mg/dL Final  64/40/3474 25:95 AM 7.36 (H) 0.61 - 1.24 mg/dL Final  63/87/5643 32:95 AM 7.10 (H) 0.61 - 1.24 mg/dL Final  18/84/1660 63:01 AM 7.10 (H) 0.61 - 1.24 mg/dL Final  60/08/9322 55:73 AM 6.11 (H) 0.61 - 1.24 mg/dL Final  22/12/5425 06:23 PM 5.92 (H) 0.61 - 1.24 mg/dL Final  76/28/3151 76:16 PM 5.91 (H) 0.61 - 1.24 mg/dL Final  07/37/1062 69:48 PM 5.82 (H) 0.61 - 1.24 mg/dL  Final  54/62/7035 00:93 AM 1.10 0.61 - 1.24 mg/dL Final  81/82/9937 16:96 PM 0.74 0.40 - 1.50 mg/dL Final  78/93/8101 75:10 PM 0.72 0.40 - 1.50 mg/dL Final  25/85/2778 24:23 AM 1.47 0.4 - 1.5 mg/dL Final  53/61/4431 54:00 AM 1.00 0.4 - 1.5 mg/dL Final  86/76/1950 93:26 PM 0.83 0.4 - 1.5 mg/dL Final  71/24/5809 98:33 AM 0.86 0.4 - 1.5 mg/dL Final  82/50/5397 67:34 AM 1.18 0.4 - 1.5 mg/dL Final  19/37/9024 09:73 AM 1.09 0.4 - 1.5 mg/dL Final  53/29/9242 68:34 PM 0.86 0.4 - 1.5 mg/dL Final  19/62/2297 98:92 PM 0.87 (0.40-1.50 mg/dL Final  11/94/1740 81:44 PM 0.91 (0.40-1.50 mg/dL Final  81/85/6314 97:02 PM 0.76 0.40 - 1.50 mg/dL Final  63/78/5885 02:77 PM 0.95 0.40 - 1.50 mg/dL Final  41/28/7867 67:20 PM 0.72 0.40 - 1.50 mg/dL Final  94/70/9628 36:62 PM 0.71 0.40 - 1.50 mg/dL Final  94/76/5465 03:54 PM 0.64  Final  05/10/2008 02:45 PM 0.80 0.40 - 1.50 mg/dL Final  65/68/1275 17:00 PM 0.93 0.40 - 1.50 mg/dL Final  17/49/4496 75:91 PM 0.71 0.40 - 1.50 mg/dL Final  63/84/6659 93:57 PM 0.74 0.40 - 1.50 mg/dL Final  01/77/9390 30:09 PM 1.07 0.40 - 1.50 mg/dL Final  23/30/0762 26:33 PM 0.80 0.40 - 1.50 mg/dL Final   Recent Labs  Lab 03/01/23 1730 03/01/23 2000 03/01/23 2250 03/02/23 0415  NA 134* 136  135 134* 134*  K 6.9* 6.7*  6.5* 6.3* 5.5*  CL 92* 94*  94* 96* 95*  CO2 20*  GLUCOSE 106* 104*  108* 86 77  BUN 60* 63*  62* 63* 64*  CREATININE 8.46* 8.75*  8.76* 8.46* 8.39*  CALCIUM 9.2 9.2  9.1 9.6 8.4*  PHOS  --   --   --  5.4*   Recent Labs  Lab 03/01/23 1730 03/02/23 0415  WBC 6.7 7.2  NEUTROABS 3.3  --   HGB 12.5* 11.6*  HCT 40.4 39.1  MCV 91.2 92.4  PLT 219 228   Liver Function Tests: Recent Labs  Lab 03/01/23 1730 03/01/23 2000 03/02/23 0415  AST 39 46*  --   ALT 24 25  --   ALKPHOS 76 83  --   BILITOT 1.0 0.9  --   PROT 8.2* 8.3*  --   ALBUMIN 3.3* 3.4* 3.1*   No results for input(s): "LIPASE", "AMYLASE" in the last 168 hours. No  results for input(s): "AMMONIA" in the last 168 hours. Cardiac Enzymes: No results for input(s): "CKTOTAL", "CKMB", "CKMBINDEX", "TROPONINI" in the last 168 hours. Iron Studies: No results for input(s): "IRON", "TIBC", "TRANSFERRIN", "FERRITIN" in the last 72 hours. PT/INR: (inr:5)  Xrays/Other Studies: ) Results for orders placed or performed during the hospital encounter of 03/01/23 (  from the past 48 hour(s))  CBC with Differential     Status: Abnormal   Collection Time: 03/01/23  5:30 PM  Result Value Ref Range   WBC 6.7 4.0 - 10.5 K/uL   RBC 4.43 4.22 - 5.81 MIL/uL   Hemoglobin 12.5 (L) 13.0 - 17.0 g/dL   HCT 16.1 09.6 - 04.5 %   MCV 91.2 80.0 - 100.0 fL   MCH 28.2 26.0 - 34.0 pg   MCHC 30.9 30.0 - 36.0 g/dL   RDW 40.9 (H) 81.1 - 91.4 %   Platelets 219 150 - 400 K/uL   nRBC 0.0 0.0 - 0.2 %   Neutrophils Relative % 49 %   Neutro Abs 3.3 1.7 - 7.7 K/uL   Lymphocytes Relative 27 %   Lymphs Abs 1.8 0.7 - 4.0 K/uL   Monocytes Relative 18 %   Monocytes Absolute 1.2 (H) 0.1 - 1.0 K/uL   Eosinophils Relative 5 %   Eosinophils Absolute 0.3 0.0 - 0.5 K/uL   Basophils Relative 1 %   Basophils Absolute 0.1 0.0 - 0.1 K/uL   Immature Granulocytes 0 %   Abs Immature Granulocytes 0.02 0.00 - 0.07 K/uL    Comment: Performed at [facility], [address] [place], [place] [address]  Comprehensive metabolic panel     Status: Abnormal   Collection Time: 03/01/23  5:30 PM  Result Value Ref Range   Sodium 134 (L) 135 - 145 mmol/L   Potassium 6.9 (HH) 3.5 - 5.1 mmol/L    Comment: HEMOLYSIS AT THIS LEVEL MAY AFFECT RESULT CRITICAL RESULT CALLED TO, READ BACK BY AND VERIFIED WITH M,GROSE RN @1828  03/01/23 E,BENTON    Chloride 92 (L) 98 - 111 mmol/L   CO2 24 22 - 32 mmol/L   Glucose, Bld 106 (H) 70 - 99 mg/dL    Comment: Glucose reference range applies only to samples taken after fasting for at least 8 hours.   BUN 60 (H) 8 - 23 mg/dL   Creatinine, Ser 6.21 (H) 0.61 - 1.24  mg/dL   Calcium 9.2 8.9 - 30.8 mg/dL   Total Protein 8.2 (H) 6.5 - 8.1 g/dL   Albumin 3.3 (L) 3.5 - 5.0 g/dL   AST 39 15 - 41 U/L    Comment: HEMOLYSIS AT THIS LEVEL MAY AFFECT RESULT   ALT 24 0 - 44 U/L    Comment: HEMOLYSIS AT THIS LEVEL MAY AFFECT RESULT   Alkaline Phosphatase 76 38 - 126 U/L   Total Bilirubin 1.0 0.3 - 1.2 mg/dL    Comment: HEMOLYSIS AT THIS LEVEL MAY AFFECT RESULT   GFR, Estimated 7 (L) >60 mL/min    Comment: (NOTE) Calculated using the CKD-EPI Creatinine Equation (2021)    Anion gap 18 (H) 5 - 15    Comment: Performed at [facility], [address] [place], [place] [address]  Troponin I (High Sensitivity)     Status: Abnormal   Collection Time: 03/01/23  5:30 PM  Result Value Ref Range   Troponin I (High Sensitivity) 86 (H) <18 ng/L    Comment: (NOTE) Elevated high sensitivity troponin I (hsTnI) values and significant  changes across serial measurements may suggest ACS but many other  chronic and acute conditions are known to elevate hsTnI results.  Refer to the "Links" section for chest pain algorithms and additional  guidance. Performed at [facility], [address] [place], [place] [address]   Basic metabolic panel  Status: Abnormal   Collection Time: 03/01/23  8:00 PM  Result Value Ref Range   Sodium 135 135 - 145 mmol/L   Potassium 6.5 (HH) 3.5 - 5.1 mmol/L    Comment: HEMOLYSIS AT THIS LEVEL MAY AFFECT RESULT CRITICAL RESULT CALLED TO, READ BACK BY AND VERIFIED WITH T SHEARIN RN 03/01/23 2101 M KOROLESKI    Chloride 94 (L) 98 - 111 mmol/L   CO2 26 22 - 32 mmol/L   Glucose, Bld 108 (H) 70 - 99 mg/dL    Comment: Glucose reference range applies only to samples taken after fasting for at least 8 hours.   BUN 62 (H) 8 - 23 mg/dL   Creatinine, Ser 1.61 (H) 0.61 - 1.24 mg/dL   Calcium 9.1 8.9 - 09.6 mg/dL   GFR, Estimated 6 (L) >60 mL/min    Comment: (NOTE) Calculated using the CKD-EPI Creatinine Equation (2021)    Anion gap  15 5 - 15    Comment: Performed at [facility], [address] [place], [place] [address]  Vancomycin, random     Status: None   Collection Time: 03/01/23  8:00 PM  Result Value Ref Range   Vancomycin Rm 16 ug/mL    Comment:        Random Vancomycin therapeutic range is dependent on dosage and time of specimen collection. A peak range is 20-40 ug/mL A trough range is 5-15 ug/mL        Performed at [facility], [address] [place], [place] [address]   Hepatic function panel     Status: Abnormal   Collection Time: 03/01/23  8:00 PM  Result Value Ref Range   Total Protein 8.3 (H) 6.5 - 8.1 g/dL   Albumin 3.4 (L) 3.5 - 5.0 g/dL   AST 46 (H) 15 - 41 U/L    Comment: HEMOLYSIS AT THIS LEVEL MAY AFFECT RESULT   ALT 25 0 - 44 U/L    Comment: HEMOLYSIS AT THIS LEVEL MAY AFFECT RESULT   Alkaline Phosphatase 83 38 - 126 U/L   Total Bilirubin 0.9 0.3 - 1.2 mg/dL    Comment: HEMOLYSIS AT THIS LEVEL MAY AFFECT RESULT   Bilirubin, Direct 0.4 (H) 0.0 - 0.2 mg/dL   Indirect Bilirubin 0.5 0.3 - 0.9 mg/dL    Comment: Performed at [facility], [address] [place], [place] [address]  Troponin I (High Sensitivity)     Status: Abnormal   Collection Time: 03/01/23  8:00 PM  Result Value Ref Range   Troponin I (High Sensitivity) 105 (HH) <18 ng/L    Comment: CRITICAL RESULT CALLED TO, READ BACK BY AND VERIFIED WITH T SHEARIN RN 03/01/23 2101 M KOROLESKI (NOTE) Elevated high sensitivity troponin I (hsTnI) values and significant  changes across serial measurements may suggest ACS but many other  chronic and acute conditions are known to elevate hsTnI results.  Refer to the "Links" section for chest pain algorithms and additional  guidance. Performed at [facility], [address] [place], [place] [address]   Basic metabolic panel     Status: Abnormal   Collection Time: 03/01/23  8:00 PM  Result Value Ref Range   Sodium 136 135 - 145 mmol/L   Potassium 6.7  (HH) 3.5 - 5.1 mmol/L    Comment: HEMOLYSIS AT THIS LEVEL MAY AFFECT RESULT CRITICAL RESULT CALLED TO, READ BACK BY AND VERIFIED WITH T [facility] RN 03/01/23 2233 M KOROLESKI    Chloride  94 (L) 98 - 111 mmol/L   CO2 23 22 - 32 mmol/L   Glucose, Bld 104 (H) 70 - 99 mg/dL    Comment: Glucose reference range applies only to samples taken after fasting for at least 8 hours.   BUN 63 (H) 8 - 23 mg/dL   Creatinine, Ser 1.61 (H) 0.61 - 1.24 mg/dL   Calcium 9.2 8.9 - 09.6 mg/dL   GFR, Estimated 6 (L) >60 mL/min    Comment: (NOTE) Calculated using the CKD-EPI Creatinine Equation (2021)    Anion gap 19 (H) 5 - 15    Comment: Performed at [facility], [address], [place], [place] [address]  Hepatitis B surface antigen     Status: None   Collection Time: 03/01/23  8:00 PM  Result Value Ref Range   Hepatitis B Surface Ag NON REACTIVE NON REACTIVE    Comment: Performed at  [facility], [address], [place], [place] [address]  Basic metabolic panel     Status: Abnormal   Collection Time: 03/01/23 10:50 PM  Result Value Ref Range   Sodium 134 (L) 135 - 145 mmol/L   Potassium 6.3 (HH) 3.5 - 5.1 mmol/L    Comment: ATTEMPTED CALL TO [phone] 03/01/23 2355 M KOROLESKI CRITICAL RESULT CALLED TO, READ BACK BY AND VERIFIED WITH T SHEARIN RN 03/02/23 0017 M KOROLESKI    Chloride 96 (L) 98 - 111 mmol/L   CO2 23 22 - 32 mmol/L   Glucose, Bld 86 70 - 99 mg/dL    Comment: Glucose reference range applies only to samples taken after fasting for at least 8 hours.   BUN 63 (H) 8 - 23 mg/dL   Creatinine, Ser 1.47 (H) 0.61 - 1.24 mg/dL   Calcium 9.6 8.9 - 82.9 mg/dL   GFR, Estimated 7 (L) >60 mL/min    Comment: (NOTE) Calculated using the CKD-EPI Creatinine Equation (2021)    Anion gap 15 5 - 15    Comment: Performed at [facility], [address] [place], [place] [address]  Renal function panel     Status: Abnormal   Collection Time: 03/02/23  4:15 AM  Result Value Ref Range    Sodium 134 (L) 135 - 145 mmol/L   Potassium 5.5 (H) 3.5 - 5.1 mmol/L    Comment: HEMOLYSIS AT THIS LEVEL MAY AFFECT RESULT   Chloride 95 (L) 98 - 111 mmol/L   CO2 20 (L) 22 - 32 mmol/L   Glucose, Bld 77 70 - 99 mg/dL    Comment: Glucose reference range applies only to samples taken after fasting for at least 8 hours.   BUN 64 (H) 8 - 23 mg/dL   Creatinine, Ser 0.86 (H) 0.61 - 1.24 mg/dL   Calcium 8.4 (L) 8.9 - 10.3 mg/dL   Phosphorus 5.4 (H) 2.5 - 4.6 mg/dL   Albumin 3.1 (L) 3.5 - 5.0 g/dL   GFR, Estimated 7 (L) >60 mL/min    Comment: (NOTE) Calculated using the CKD-EPI Creatinine Equation (2021)    Anion gap 19 (H) 5 - 15    Comment: Performed at [facility], [address] [place], [place] [address]  CBC     Status: Abnormal   Collection Time: 03/02/23  4:15 AM  Result Value Ref Range   WBC 7.2 4.0 - 10.5 K/uL   RBC 4.23 4.22 - 5.81 MIL/uL   Hemoglobin 11.6 (L) 13.0 - 17.0 g/dL   HCT 96.2 95.2 -  52.0 %   MCV 92.4 80.0 - 100.0 fL   MCH 27.4 26.0 - 34.0 pg   MCHC 29.7 (L) 30.0 - 36.0 g/dL   RDW 19.7 (H) 58.8 - 32.5 %   Platelets 228 150 - 400 K/uL   nRBC 0.0 0.0 - 0.2 %    Comment: Performed at [facility], [address] [place], [place] [address]   DG Chest Portable 1 View  Result Date: 03/01/2023 CLINICAL DATA:  Chest pain. EXAM: PORTABLE CHEST 1 VIEW COMPARISON:  05/10/2018 FINDINGS: Mild cardiomegaly is seen. Prior CABG noted. Right jugular dual-lumen central venous catheter is seen with TIPS overlying the right atrium. No evidence of pneumothorax. Both lungs clear.  No evidence of pleural effusion. IMPRESSION: Mild cardiomegaly. No active lung disease. Electronically Signed   By: Danae Orleans M.D.   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   Candidiasis of mouth    Cardiomyopathy (HCC)    Chest pain, unspecified    Diseases of lips    Angular cheilitis   HIV  infection (HCC)    Hypertension    Lymphoma (HCC)    Personal history of unspecified disease of respiratory system    Pneumocystosis (HCC)    Shingles    Tobacco use     PSH:   Past Surgical History:  Procedure Laterality Date   BASCILIC VEIN TRANSPOSITION Left 05/24/2018   Procedure: FIRST STAGE BASCILIC VEIN TRANSPOSITION;  Surgeon: Larina Earthly, MD;  Location: [facility] OR;  Service: Vascular;  Laterality: Left;   IR FLUORO GUIDE CV LINE RIGHT  05/17/2018   IR US GUIDE VASC ACCESS RIGHT  05/17/2018    Allergies:  Allergies  Allergen Reactions   Penicillins Anaphylaxis    Has patient had a PCN reaction causing immediate rash, facial/tongue/throat swelling, SOB or lightheadedness with hypotension: Unknown Has patient had a PCN reaction causing severe rash involving mucus membranes or skin necrosis: Unknown Has patient had a PCN reaction that required hospitalization: Unknown Has patient had a PCN reaction occurring within the last 10 years: No If all of the above answers are "NO", then may proceed with Cephalosporin use.    Medications:   Prior to Admission medications   Medication Sig Start Date End Date Taking? Authorizing Provider  atorvastatin (LIPITOR) 80 MG tablet Take 80 mg by mouth daily. 01/04/23  Yes [provider]  clopidogrel (PLAVIX) 75 MG tablet Take 75 mg by mouth daily. 10/03/22  Yes [provider]  D3-50 1.25 MG (50000 UT) capsule Take 50,000 Units by mouth once a week. 01/27/23  Yes [provider]  ELIQUIS 5 MG TABS tablet Take 5 mg by mouth 2 (two) times daily. 05/17/22  Yes [provider]  famotidine (PEPCID) 40 MG tablet Take 40 mg by mouth daily. 01/04/23  Yes [provider]  hydrALAZINE (APRESOLINE) 100 MG tablet Take 100 mg by mouth 3 (three) times daily.   Yes [provider]  isosorbide mononitrate (IMDUR) 60 MG 24 hr tablet Take 60 mg by mouth daily. 01/04/23  Yes [provider]  lisinopril (ZESTRIL)  10 MG tablet Take 10 mg by mouth daily. 02/16/23  Yes [provider]  methocarbamol (ROBAXIN) 500 MG tablet Take 500 mg by mouth 4 (four) times daily.   Yes [provider]  minoxidil (LONITEN) 10 MG tablet Take 10  mg by mouth 2 (two) times daily. 08/16/21  Yes [provider]  mirtazapine (REMERON) 7.5 MG tablet Take 7.5 mg by mouth at bedtime. 01/04/23  Yes [provider]  nitroGLYCERIN (NITROSTAT) 0.4 MG SL tablet Place 0.4 mg under the tongue every 5 (five) minutes as needed for chest pain. 04/27/19  Yes [provider]  Oxycodone HCl 10 MG TABS Take 10 mg by mouth every 8 (eight) hours as needed (for pain). 10/02/22  Yes [provider]  sevelamer carbonate (RENVELA) 800 MG tablet Take 1,600-2,400 mg by mouth See admin instructions. Take 3 tablets by mouth three times daily with meals and 2 tablets twice daily with snacks 12/04/21  Yes [provider]  amiodarone (PACERONE) 200 MG tablet Take 200 mg by mouth daily.    [provider]  amitriptyline (ELAVIL) 10 MG tablet Take 10 mg by mouth at bedtime. 12/04/21   [provider]  aspirin 81 MG chewable tablet Chew 1 tablet (81 mg total) by mouth daily. With Food 05/27/18   Shon Hale, MD  carvedilol (COREG) 12.5 MG tablet Take 1 tablet (12.5 mg total) by mouth 2 (two) times daily with a meal. Patient taking differently: Take 25 mg by mouth 2 (two) times daily with a meal. 05/26/18   Emokpae, Courage, MD  dapsone 100 MG tablet Take 1 tablet (100 mg total) by mouth daily. 05/26/18   Shon Hale, MD  dolutegravir (TIVICAY) 50 MG tablet Take 1 tablet (50 mg total) by mouth daily. 05/26/18   Shon Hale, MD  rilpivirine (EDURANT) 25 MG TABS tablet Take 1 tablet (25 mg total) by mouth daily with breakfast. 05/27/18   Shon Hale, MD    Discontinued Meds:   Medications Discontinued During This Encounter  Medication Reason   calcium gluconate 1 g/ 50 mL sodium  chloride IVPB    albuterol (PROVENTIL) (2.5 MG/3ML) 0.083% nebulizer solution 5 mg    insulin aspart (novoLOG) injection 5 Units    dextrose 50 % solution 50 mL    vancomycin (VANCOREADY) IVPB 1500 mg/300 mL    azithromycin (ZITHROMAX) 600 MG tablet Completed Course   calcium acetate (PHOSLO) 667 MG capsule Change in therapy   darunavir-cobicistat (PREZCOBIX) 800-150 MG tablet Discontinued by provider   polyethylene glycol (MIRALAX / GLYCOLAX) packet Patient Preference   fluconazole (DIFLUCAN) 100 MG tablet Completed Course   bisacodyl (DULCOLAX) 10 MG suppository No longer needed (for PRN medications)   ondansetron (ZOFRAN) 4 MG tablet Prescription never filled   amLODipine (NORVASC) 10 MG tablet Discontinued by provider    Social History:  reports that he quit smoking about 7 years ago. He has a 35.00 pack-year smoking history. He has never used smokeless tobacco. He reports current alcohol use. He reports that he does not use drugs.  Family History:   Family History  Problem Relation Age of Onset   Heart disease Mother    Diabetes Mother    Cancer Mother    Diabetes Sister    CAD Sister        ? stents   Diabetes Maternal Grandmother    Hypertension Brother     Blood pressure (!) 155/67, pulse 70, temperature 98 F (36.7 C), temperature source Oral, resp. rate 20, height  (1.651 m), weight 63.5 kg, SpO2 100 %. General appearance: alert, cooperative, and appears stated age Head: Normocephalic, without obvious abnormality, atraumatic Eyes: negative Neck: no adenopathy, no carotid bruit, supple, symmetrical, trachea midline, and thyroid not enlarged, symmetric,  no tenderness/mass/nodules Back: symmetric, no curvature. ROM normal. No CVA tenderness. Resp: clear to auscultation bilaterally Cardio: regular rate and rhythm GI: soft, non-tender; bowel sounds normal; no masses,  no organomegaly Extremities: extremities normal, atraumatic, no cyanosis or edema Pulses: 2+ and  symmetric Skin: Skin color, texture, turgor normal. No rashes or lesions Access: RIJ TC, lt arm accesses thrombosed       Claressa Hughley, Len Blalock, MD 03/02/2023, 5:10 AM

## 2023-03-02 NOTE — ED Notes (Signed)
Pt resting and playing on personal cell phone, lights off per pt's request. NAD noted, observed even RR and unlabored, side rails up x2 for safety, plan of care ongoing, pt expresses no needs or concerns at this time, call light within reach, no further concerns as of present.

## 2023-03-02 NOTE — ED Notes (Signed)
Pharmacy called in reference to verifing pt's medication list so his night time meds may be given on time as pt did not receive his earlier dose d/t not "being verify" by pharmacy. Pharmacy reported, medications were not verify as they assume pt was an AMA from earlier today at [facility]. Given update that that was correct, although pt is now admitted to [facility] to be transfer back to [facility] and will need his night time medications. Pharmacy will get pt's meds verify and medication to be administer, refer to [facility]

## 2023-03-02 NOTE — Subjective & Objective (Signed)
CC: abd pain, chest pain HPI: 62 year old African-American male history of HIV, end-stage renal disease on hemodialysis, coronary artery disease, tobacco abuse, chronic pain, presents to the ER today with complaints of chest pain and abdominal pain.  Patient has been admitted multiple times to [facility] over the last week.  His first admission was on 02/18/2023.  He signed out AMA on 02/26/2023.  He was readmitted again that same day on 02-26-2023 and left AMA again on 4- 13-2024.  He was readmitted again on 02/28/2023 and then left AMA again on 03/01/2023.  During one of his hospitalizations he had a transesophageal echocardiogram that showed aortic valve vegetations along with a mobile vegetation on his hemodialysis catheter.  Fortunately none of his blood cultures grew out any bacteria.  His Karius profile was sent but results are not yet resulted.  Blood cultures from 02/21/2023 and 02/22/2023 are negative.  Patient had Mycobacterium blood cultures performed on 02/27/2023.  They are still no growth to date.  Upon review of his EMR, he had a left heart catheterization at [facility] on 01/02/2023.  Patient had a drug-eluting stent placed in the PDA due to in-stent restenosis.  Unclear if the patient is been taking dual antiplatelet therapy since his discharge.  Patient presented to [facility], ER today with complaints of chest pain and abdominal pain.  On arrival temp 90.3 heart rate 65 blood pressure 125/54 satting 95% on room air.  Labs showed a white count of 6.7, hemoglobin 12.5, platelet 219  Sodium 134, potassium 6.9, bicarb of 24, BUN of 60, creatinine 8.46  Patient's hyperkalemia was treated with albuterol nebs, 5 units of IV insulin, IV dextrose, 10 g of p.o. Lokelma, 1 amp of calcium gluconate.  Nephrology was consulted for emergent dialysis.  [facility] hospitalist contacted for admission.  Upon entering the room, the patient was laying sideways on the ER gurney with his legs on the floor, and  his upper back still on the ER gurney.  After waking the patient up and having him sit on the side of the bed, patient states that he does not want to be admitted to the hospital.  I asked him multiple times he gave him the reasons why he needed to be admitted to the hospital including his hyperkalemia, endocarditis and is infected hemodialysis catheter.  Patient continued to refuse admission.  Patient's bedside nurse was brought in the room.  She asked the same questions.  He continued to refuse hospital admission.  Discussed this with the EDP.  Patient will need to sign out AMA.

## 2023-03-02 NOTE — Assessment & Plan Note (Signed)
Nephrology was consulted.  Patient should be undergoing emergent dialysis soon.

## 2023-03-02 NOTE — H&P (Signed)
History and Physical  [name] ZOX:[id number] DOB: 09/24/1961 DOA: 03/02/2023  PCP: [name], MD   Chief Complaint: Chest Pain   HPI: [name] is a 62 y.o. 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  Of note patient has been admitted to [facility] over the past week beginning on 02/18/2023.  Signed out AMA on 4/11.  Readmitted again the same day on 411/24 and once again left AMA on 4/13.  He was readmitted again on 4/13 and left AMA on 4/14 which was this past Saturday.  During this timeframe he had an echocardiogram suggestive of subacute bacterial endocarditis involving the aortic valve.  He is underwent a TEE that demonstrated aortic valve small mobile vegetation with moderate AR.  Unable to adequately assess aortic stenosis.  Also the catheter tip in the right atrium also demonstrated a mobile vegetation.  Blood cultures from that same hospitalization on 4/6 and 4/7 have demonstrated no growth to date.  He had mycobacterium blood cultures performed on 4/12 with no growth noted.  In February 2024 he underwent left heart catheterization at [facility] and had a drug-eluting stent placed in the PDA due to in-stent restenosis.  It is unclear if the patient has been taking his dual antiplatelet drugs since discharge.   ED Course: He presented to [facility] in the early morning hours of 4/15 with complaints of chest pain.  On evaluation, he had vital signs which were  relatively stable.  Lab work was done which showed white count of 7, hemoglobin 12, platelets 219.  Sodium 134, potassium 6.9, BUN 60 creatinine 8.46.  His hyperkalemia was treated in the ER, and nephrology was consulted for emergent dialysis.  [facility] hospitalist was consulted for admission, but patient adamantly refused admission and stated he would leave AGAINST MEDICAL ADVICE.  Once the patient returned from dialysis, he seemed agreeable to discharge from the hospital, was seen again by the hospitalist service at about 10 AM today.  Once the patient returned from dialysis, he seemed agreeable to discharge from the hospital, was seen again by the hospitalist service at about 10 AM today because he stated he was willing to stay in the hospital.  Per ER documentation, at about 11:45 AM patient was being belligerent, stating that he had not been seen and did not know what the plan was, despite multiple providers and nursing staff attending to the patient, explaining plan for his medical care.  Eventually, the patient signed out AMA at about 12:15 PM.  Apparently he walked from [facility] to the [employer].  Currently: Patient is seen sitting up edge of the bed in the ER.  He tells me that he presented to [facility], ER due to chest pain, was told the same thing he was told at [facility] which is that he has an infection and a "cyst" in his heart and that it could kill him.  He says that he left [facility], ER this morning because they kicked him  out.  He says that he was worried and disoriented, because he remembers going to dialysis and afterwards woke up in a different room and did not know where he was.  He is adamant that he did not leave the hospital AMA, but was rather kicked out.  Says he is currently having some intermittent chest pain, but denies any fevers, chills, weight loss.  Upon further discussion, he tells me that he was not aware that his dialysis catheter was also infected.  Review of  Systems: Please see HPI for pertinent positives and negatives. A complete 10 system review of systems are otherwise negative.  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   Candidiasis of mouth    Cardiomyopathy    Chest pain, unspecified    Diseases of lips    Angular cheilitis   HIV infection    Hypertension    Lymphoma    Personal history of unspecified disease of respiratory system    Pneumocystosis    Shingles    Tobacco use    Past Surgical History:  Procedure Laterality Date   BASCILIC VEIN TRANSPOSITION Left 05/24/2018   Procedure: FIRST STAGE BASCILIC VEIN TRANSPOSITION;  Surgeon: Larina Earthly, MD;  Location: [facility] OR;  Service: Vascular;  Laterality: Left;   IR FLUORO GUIDE CV LINE RIGHT  05/17/2018   IR US GUIDE VASC ACCESS RIGHT  05/17/2018    Social History:  reports that he quit smoking about 7 years ago. He has a 35.00 pack-year smoking history. He has never used smokeless tobacco. He reports current alcohol use. He reports that he does not use drugs.   Allergies  Allergen Reactions   Penicillins Anaphylaxis    Has patient had a PCN reaction causing immediate rash, facial/tongue/throat swelling, SOB or lightheadedness with hypotension: Unknown Has patient had a PCN reaction causing severe rash involving mucus membranes or skin necrosis: Unknown Has patient had a PCN reaction that required hospitalization: Unknown Has patient had a PCN reaction occurring within the last 10 years: No If all of the above answers are "NO", then may proceed with Cephalosporin use.    Family History  Problem Relation Age of Onset   Heart disease Mother    Diabetes Mother    Cancer Mother    Diabetes Sister    CAD Sister        ? stents   Diabetes Maternal Grandmother    Hypertension Brother      Prior to Admission medications   Medication Sig Start Date End Date Taking? Authorizing Provider  amiodarone (PACERONE) 200 MG tablet  Take 200 mg by mouth daily.    [provider]  amitriptyline (ELAVIL) 10 MG tablet Take 10 mg by mouth at bedtime. 12/04/21   [provider]  aspirin 81 MG chewable tablet Chew 1 tablet (81 mg total) by mouth daily. With Food 05/27/18   Shon Hale, MD  atorvastatin (LIPITOR) 80 MG tablet Take 80 mg by mouth daily. 01/04/23   [provider]  carvedilol (COREG) 12.5 MG tablet Take 1 tablet (12.5 mg total) by mouth 2 (two) times daily with a meal. Patient taking differently: Take 25 mg by mouth 2 (two) times daily with a meal. 05/26/18   Emokpae, Courage, MD  clopidogrel (PLAVIX) 75 MG tablet Take 75 mg by mouth daily. 10/03/22   [provider]  D3-50 1.25 MG (50000 UT) capsule Take 50,000 Units by  mouth once a week. 01/27/23   [provider]  dapsone 100 MG tablet Take 1 tablet (100 mg total) by mouth daily. 05/26/18   Shon Hale, MD  dolutegravir (TIVICAY) 50 MG tablet Take 1 tablet (50 mg total) by mouth daily. 05/26/18   Emokpae, Courage, MD  ELIQUIS 5 MG TABS tablet Take 5 mg by mouth 2 (two) times daily. 05/17/22   [provider]  famotidine (PEPCID) 40 MG tablet Take 40 mg by mouth daily. 01/04/23   [provider]  hydrALAZINE (APRESOLINE) 100 MG tablet Take 100 mg by mouth 3 (three) times daily.    [provider]  isosorbide mononitrate (IMDUR) 60 MG 24 hr tablet Take 60 mg by mouth daily. 01/04/23   [provider]  lisinopril (ZESTRIL) 10 MG tablet Take 10 mg by mouth daily. 02/16/23   [provider]  methocarbamol (ROBAXIN) 500 MG tablet Take 500 mg by mouth 4 (four) times daily.    [provider]  minoxidil (LONITEN) 10 MG tablet Take 10 mg by mouth 2 (two) times daily. 08/16/21   [provider]  mirtazapine (REMERON) 7.5 MG tablet Take 7.5 mg by mouth at bedtime. 01/04/23   [provider]  nitroGLYCERIN (NITROSTAT) 0.4 MG SL tablet Place 0.4 mg under the tongue  every 5 (five) minutes as needed for chest pain. 04/27/19   [provider]  Oxycodone HCl 10 MG TABS Take 10 mg by mouth every 8 (eight) hours as needed (for pain). 10/02/22   [provider]  rilpivirine (EDURANT) 25 MG TABS tablet Take 1 tablet (25 mg total) by mouth daily with breakfast. 05/27/18   Shon Hale, MD  sevelamer carbonate (RENVELA) 800 MG tablet Take 1,600-2,400 mg by mouth See admin instructions. Take 3 tablets by mouth three times daily with meals and 2 tablets twice daily with snacks 12/04/21   [provider]    Physical Exam: BP (!) 156/73 (BP Location: Left Arm)   Pulse 87   Temp 97.9 F (36.6 C) (Oral)   Resp 16   Ht 5\' 5"  (1.651 m)   Wt 63.5 kg   SpO2 100%   BMI 23.30 kg/m   General:  Alert, oriented, calm, in no acute distress, disheveled in appearance. Eyes: EOMI, clear conjuctivae, white sclerea Neck: supple, no masses, trachea mildline  Cardiovascular: RRR, no peripheral edema  Respiratory: clear to auscultation bilaterally, no wheezes, no crackles  Abdomen: soft, nontender, nondistended, normal bowel tones heard  Skin: dry, no rashes; HD catheter in right anterior chest nontender, nonerythematous Musculoskeletal: no joint effusions, normal range of motion  Psychiatric: appropriate affect, normal speech, demonstrates insight into his medical condition Neurologic: extraocular muscles intact, clear speech, moving all extremities with intact sensorium          Labs on Admission:  Basic Metabolic Panel: Recent Labs  Lab 03/01/23 1730 03/01/23 2000 03/01/23 2250 03/02/23 0415  NA 134* 136  135 134* 134*  K 6.9* 6.7*  6.5* 6.3* 5.5*  CL 92* 94*  94* 96* 95*  CO2 24 23  26 23  20*  GLUCOSE 106* 104*  108* 86 77  BUN 60* 63*  62* 63* 64*  CREATININE 8.46* 8.75*  8.76* 8.46* 8.39*  CALCIUM 9.2 9.2  9.1 9.6 8.4*  PHOS  --   --   --  5.4*   Liver Function Tests: Recent Labs  Lab 03/01/23 1730 03/01/23 2000  03/02/23 0415  AST 39 46*  --  ALT 24 25  --   ALKPHOS 76 83  --   BILITOT 1.0 0.9  --   PROT 8.2* 8.3*  --   ALBUMIN 3.3* 3.4* 3.1*   No results for input(s): "LIPASE", "AMYLASE" in the last 168 hours. No results for input(s): "AMMONIA" in the last 168 hours. CBC: Recent Labs  Lab 03/01/23 1730 03/02/23 0415  WBC 6.7 7.2  NEUTROABS 3.3  --   HGB 12.5* 11.6*  HCT 40.4 39.1  MCV 91.2 92.4  PLT 219 228   Cardiac Enzymes: No results for input(s): "CKTOTAL", "CKMB", "CKMBINDEX", "TROPONINI" in the last 168 hours.  BNP (last 3 results) No results for input(s): "BNP" in the last 8760 hours.  ProBNP (last 3 results) No results for input(s): "PROBNP" in the last 8760 hours.  CBG: No results for input(s): "GLUCAP" in the last 168 hours.  Radiological Exams on Admission: DG Chest Portable 1 View  Result Date: 03/01/2023 CLINICAL DATA:  Chest pain. EXAM: PORTABLE CHEST 1 VIEW COMPARISON:  05/10/2018 FINDINGS: Mild cardiomegaly is seen. Prior CABG noted. Right jugular dual-lumen central venous catheter is seen with TIPS overlying the right atrium. No evidence of pneumothorax. Both lungs clear.  No evidence of pleural effusion. IMPRESSION: Mild cardiomegaly. No active lung disease. Electronically Signed   By: Danae Orleans M.D.   On: 03/01/2023 17:50    Assessment/Plan * Aortic valve endocarditis Pt with aortic valve endocarditis seen on transesophageal echocardiogram done at [facility] on 02/20/2023.  Patient also had a mobile vegetation noted on his hemodialysis catheter that was in his right atrium.   Patient has not had his hemodialysis catheter changed as he left AMA twice from [facility] medical center.  This cath was placed on 12/06/2021 according to outpatient records from [facility].  Initially this patient declined admission despite conversations regarding the severity of his illness.  Later after hemodialysis he agreed to admission but then left AMA from [facility] and presented here to  [facility] ED. Recent blood cultures 1 week ago demonstrate no growth.  Unclear if patient has bacterial endocarditis or has fungal and or other opportunistic infection causing the vegetations given he has known HIV disease with CD4 count less than 400. Fungal and blood cultures are pending from 4/14 and will need to culture tip of TDC catheter when removed for fungus as well. Defer initiation of antimicrobial and antifungal agents to ID team, secure chatted with them and they plan to consult.  **upon review of med list from [facility] patient was supposed to start taking dapsone 100 mg daily for 90 days beginning on 02/27/2023   Hemodialysis catheter infection Patient had a mobile vegetation seen on his transesophageal echocardiogram at [facility] on 02/20/2023.   His hemodialysis catheter has not yet been changed out.   ESRD on hemodialysis with acute hyperkalemia EDP initially gave [facility] with subsequent potassium decreasing to 5.5 Nephrology has been consulted and patient subsequently underwent urgent hemodialysis Post HD renal function panel has been ordered and shows improved K. Patient states he is on a Tuesday Thursday Saturday schedule, and has been compliant. Continue Renvela   Human immunodeficiency virus (HIV) disease (HCC) Last CD4 count was 182 on 02/26/2023 the reference range of [id number]. HIV diagnosed prior to 2019.  Patient has documented inconsistent follow-up and medication noncompliance. Continue Tivicay and Edurant  HIV viral load from 02-18-2023 Ref Range & Units 12 d ago  HIV-1 RNA by  PCR, Quant Not Detected DETECTED Abnormal   HIV-1 8800 200 (  Copies/ml) <=0 copies/mL <50 High   HIV-1 8800 200 (Log, Copies/mL) <=0.0 LOG copies/mL <1.7 High     Hypertension Blood pressure much better controlled after removal of excess volume during hemodialysis Continue minoxidil and carvedilol   PAF On amiodarone and Eliquis prior to admission, he is will be continued   History of HFrEF  with recovered EF Continue preadmission carvedilol Will repeat 2D echocardiogram although did have 1 week ago he has not been consistent in treatment and there could be some additional new findings.   S/P drug eluting coronary stent placement - 01-12-2023 at [facility] Had in-stent restenosis and a drug-eluting stent placed in the PDA at [facility] 01/02/2023. Patient has chronic chest pain treated medically-continue Imdur and  Ranexa Continue Plavix and statin   Chronic pain Continue short acting Oxy IR, Elavil, Neurontin   COPD Continue albuterol as needed as well as Combiven  Agitation/confusion Patient with erratic and inconsistent behavior documented in chart, often verbally abusive and aggressive. Surprisingly the patient is able to share with me quite a bit of insight into his medical problems. I did let the patient know that due to his recent decisions, we will as for the assistance of psychiatric services to assess his competency for medical decision-making. I told him not to take offense at this and it is simply part of taking the best care of him as possible. He seems to understand and be agreeable to this.   DVT prophylaxis: Heparin SQ   Full Code  Consults called: ID, Nephrology, Psychiatry  Admission status: The appropriate patient status for this patient is INPATIENT. Inpatient status is judged to be reasonable and necessary in order to provide the required intensity of service to ensure the patient's safety. The patient's presenting symptoms, physical exam findings, and initial radiographic and laboratory data in the context of their chronic comorbidities is felt to place them at high risk for further clinical deterioration. Furthermore, it is not anticipated that the patient will be medically stable for discharge from the hospital within 2 midnights of admission.    I certify that at the point of admission it is my clinical judgment that the patient will require  inpatient hospital care spanning beyond 2 midnights from the point of admission due to high intensity of service, high risk for further deterioration and high frequency of surveillance required  Time spent: 90 minutes  Vedh Ptacek Sharlette Dense MD [facility] Pager [phone]  If 7PM-7AM, please contact night-coverage [email or web address] Password [facility]  03/02/2023, 2:18 PM

## 2023-03-02 NOTE — ED Notes (Signed)
Patient C/O his buttocks are bleeding.  RN notes that he has a boil on his right buttock cheek that has been lanced.  Dry dressing placed.  Patient states that he does not know what is going on.  RN explained the course of events several times but patient still C/O not knowing what is going on.  He is refusing his ordered IV heparin and will only wear his heart monitor briefly.  Patient is angry and uncooperative disrespectful to staff and requesting to see MD.  Dr Lucretia Roers to see patient.

## 2023-03-02 NOTE — ED Notes (Signed)
Bed was rejected from [facility], awaiting new inpatient bed at this time for transfer

## 2023-03-02 NOTE — ED Notes (Addendum)
Pt gone to dialysis. Vitals stable at time of transfer.

## 2023-03-02 NOTE — ED Notes (Signed)
Pt has refused hospital admission, Dr. Imogene Burn spoke with pt about risks/benefits, pt still refuses to be admitted to the hospital.

## 2023-03-02 NOTE — ED Notes (Signed)
Pt unhooked himself from monitor and heparin drip, ambulated into hallway to yell about needing to speak to a nurse or a MD about what is going on. MD woods notified. Explained to pt that Cardiologist and Nephrology will be around to see him. Educated pt to stay in his room. Security notified of pt's disruptive behavior.

## 2023-03-02 NOTE — ED Provider Notes (Signed)
   ED Course / MDM   Clinical Course as of 03/02/23 1046  Sun Mar 01, 2023  1836 Potassium(!!): 6.9 [JL]  Mon Mar 02, 2023  1771 Patient seen last night by other provider.  History very complicated, has HIV, aortic root abscess/endocarditis, multiple hospitalizations at [facility] system where patient has left AGAINST MEDICAL ADVICE.  Also dialysis dependent and presented last night with hyperkalemia needing hemodialysis.  He was recommended to be admitted and blood cultures were obtained, received ceftriaxone and went to dialysis.  He was seen by hospitalist Dr. Imogene Burn who documented that the patient decided he wanted to leave AMA.  Patient has returned from hemodialysis.  He had a renal panel obtained around 4 AM which is reassuring with improving potassium.  The patient is now reporting he does want to be admitted.  I paged hospitalist who will place admission orders. [WS]    Clinical Course User Index [JL] Ernie Avena, MD [WS] Lonell Grandchild, MD   Medical Decision Making Amount and/or Complexity of Data Reviewed Labs: ordered. Decision-making details documented in ED Course. Radiology: ordered. ECG/medicine tests: ordered.  Risk OTC drugs. Prescription drug management. Decision regarding hospitalization.          Lonell Grandchild, MD 03/02/23 1047

## 2023-03-02 NOTE — ED Notes (Signed)
Pt given hygiene supplies and bedside set for pt to cleanse himself as requested.

## 2023-03-02 NOTE — Discharge Summary (Signed)
                                                  Against Medical Advice Patient at this time expresses desire to leave the Hospital immidiately, patient has been warned that this is not Medically advisable at this time, and can result in Medical complications like Death and Disability, patient understands and accepts the risks involved and assumes full responsibilty of this decision.  This patient has also been advised that if they feel the need for further medical assistance to return to any available ER or dial [phone].  Informed by Nursing staff that this patient has left care and has signed the form  Against Medical Advice on 03/02/2023 at 12:14 PM  Dr Lyda Jester, [facility] [place] Time spent: 10 minutes

## 2023-03-02 NOTE — Assessment & Plan Note (Addendum)
Last CD4 count was 182 on 02/26/2023 the reference range of 307 618 0999.  HIV viral load from 02-18-2023 Ref Range & Units 12 d ago  HIV-1 RNA by  PCR, Quant Not Detected DETECTED Abnormal   HIV-1 8800 200 (Copies/ml) <=0 copies/mL <50 High   HIV-1 8800 200 (Log, Copies/mL) <=0.0 LOG copies/mL <1.7 High

## 2023-03-02 NOTE — Assessment & Plan Note (Signed)
Patient had a mobile vegetation seen on his transesophageal echocardiogram at [facility] on 02/20/2023.  His hemodialysis catheter has not yet been changed out.

## 2023-03-02 NOTE — Assessment & Plan Note (Signed)
Had in-stent restenosis and a drug-eluting stent placed in the PDA at [facility] 01/02/2023.

## 2023-03-02 NOTE — ED Notes (Signed)
ED TO INPATIENT HANDOFF REPORT  ED Nurse Name and Phone #: Elnita Maxwell [phone]  S Name/Age/Gender [name] 62 y.o. male Room/Bed: WA19/WA19  Code Status   Code Status: Full Code  Home/SNF/Other Home Patient oriented to: self, place, time, and situation Is this baseline? Yes   Triage Complete: Triage complete  Chief Complaint Endocarditis determined by echocardiography [I38]  Triage Note Pt left AMA from [facility] earlier- pt states he was put out. Pt walked from [facility] to [facility].  Pt states that he is having chest pain. 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 Pt believes that he had dialysis last night.   Allergies Allergies  Allergen Reactions   Penicillins Anaphylaxis    Has patient had a PCN reaction causing immediate rash, facial/tongue/throat swelling, SOB or lightheadedness with hypotension: Unknown Has patient had a PCN reaction causing severe rash involving mucus membranes or skin necrosis: Unknown Has patient had a PCN reaction that required hospitalization: Unknown Has patient had a PCN reaction occurring within the last 10 years: No If all of the above answers are "NO", then may proceed with Cephalosporin use.    Level of Care/Admitting Diagnosis ED Disposition     ED Disposition  Admit   Condition  --   Comment  Hospital Area: [facility] [[id number]]  Level of Care: Progressive [102]  Admit to Progressive based on following criteria: NEPHROLOGY stable condition requiring close monitoring for AKI, requiring Hemodialysis or Peritoneal Dialysis either from expected electrolyte imbalance, acidosis, or fluid overload that can be managed by NIPPV or high flow oxygen.  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   Candidiasis of mouth    Cardiomyopathy    Chest pain, unspecified    Diseases of lips    Angular cheilitis   HIV infection    Hypertension    Lymphoma    Personal history of unspecified disease of respiratory system    Pneumocystosis    Shingles    Tobacco use    Past Surgical History:  Procedure Laterality Date   BASCILIC VEIN TRANSPOSITION Left 05/24/2018   Procedure: FIRST STAGE BASCILIC VEIN TRANSPOSITION;  Surgeon: Larina Earthly, MD;  Location: [facility] OR;  Service: Vascular;  Laterality: Left;   IR FLUORO GUIDE CV LINE RIGHT  05/17/2018   IR US GUIDE VASC ACCESS RIGHT  05/17/2018     A IV Location/Drains/Wounds Patient Lines/Drains/Airways Status     Active Line/Drains/Airways     Name Placement date Placement time Site Days   Fistula / Graft Left Upper arm Arteriovenous fistula 05/24/18  1439  Upper arm  1743   Hemodialysis Catheter Right Internal jugular Double-lumen;Permanent 05/17/18  0955  Internal jugular  1750   Hemodialysis Catheter Right Subclavian --  --  Subclavian  --  Intake/Output Last 24 hours No intake or output data in the 24 hours ending 03/02/23 1952  Labs/Imaging Results for orders placed or performed during the hospital encounter of 03/01/23 (from the past 48 hour(s))  CBC with Differential     Status: Abnormal   Collection Time: 03/01/23  5:30 PM  Result Value Ref Range   WBC 6.7 4.0 - 10.5 K/uL   RBC 4.43 4.22 - 5.81 MIL/uL   Hemoglobin 12.5 (L) 13.0 - 17.0 g/dL   HCT 82.9 56.2 - 13.0 %   MCV  91.2 80.0 - 100.0 fL   MCH 28.2 26.0 - 34.0 pg   MCHC 30.9 30.0 - 36.0 g/dL   RDW 86.5 (H) 78.4 - 69.6 %   Platelets 219 150 - 400 K/uL   nRBC 0.0 0.0 - 0.2 %   Neutrophils Relative % 49 %   Neutro Abs 3.3 1.7 - 7.7 K/uL   Lymphocytes Relative 27 %   Lymphs Abs 1.8 0.7 - 4.0 K/uL   Monocytes Relative 18 %   Monocytes Absolute 1.2 (H) 0.1 - 1.0 K/uL   Eosinophils Relative 5 %   Eosinophils Absolute 0.3 0.0 - 0.5 K/uL   Basophils Relative 1 %   Basophils Absolute 0.1 0.0 - 0.1 K/uL   Immature Granulocytes 0 %   Abs Immature Granulocytes 0.02 0.00 - 0.07 K/uL    Comment: Performed at [facility], [address] [place], [place] [address]  Comprehensive metabolic panel     Status: Abnormal   Collection Time: 03/01/23  5:30 PM  Result Value Ref Range   Sodium 134 (L) 135 - 145 mmol/L   Potassium 6.9 (HH) 3.5 - 5.1 mmol/L    Comment: HEMOLYSIS AT THIS LEVEL MAY AFFECT RESULT CRITICAL RESULT CALLED TO, READ BACK BY AND VERIFIED WITH M,GROSE RN  03/01/23 E,[place]    Chloride 92 (L) 98 - 111 mmol/L   CO2 24 22 - 32 mmol/L   Glucose, Bld 106 (H) 70 - 99 mg/dL    Comment: Glucose reference range applies only to samples taken after fasting for at least 8 hours.   BUN 60 (H) 8 - 23 mg/dL   Creatinine, Ser 4.13 (H) 0.61 - 1.24 mg/dL   Calcium 9.2 8.9 - 24.4 mg/dL   Total Protein 8.2 (H) 6.5 - 8.1 g/dL   Albumin 3.3 (L) 3.5 - 5.0 g/dL   AST 39 15 - 41 U/L    Comment: HEMOLYSIS AT THIS LEVEL MAY AFFECT RESULT   ALT 24 0 - 44 U/L    Comment: HEMOLYSIS AT THIS LEVEL MAY AFFECT RESULT   Alkaline Phosphatase 76 38 - 126 U/L   Total Bilirubin 1.0 0.3 - 1.2 mg/dL    Comment: HEMOLYSIS AT THIS LEVEL MAY AFFECT RESULT   GFR, Estimated 7 (L) >60 mL/min    Comment: (NOTE) Calculated using the CKD-EPI Creatinine Equation (2021)    Anion gap 18 (H) 5 - 15    Comment: Performed at [facility], [address], [place], [place] [address]  Troponin I (High Sensitivity)     Status:  Abnormal   Collection Time: 03/01/23  5:30 PM  Result Value Ref Range   Troponin I (High Sensitivity) 86 (H) <18 ng/L    Comment: (NOTE) Elevated high sensitivity troponin I (hsTnI) values and significant  changes across serial measurements may suggest ACS but many other  chronic and acute conditions are known to elevate hsTnI results.  Refer to the "  Links" section for chest pain algorithms and additional  guidance. Performed at [facility], [address], [place], [place] [address]   Basic metabolic panel     Status: Abnormal   Collection Time: 03/01/23  8:00 PM  Result Value Ref Range   Sodium 135 135 - 145 mmol/L   Potassium 6.5 (HH) 3.5 - 5.1 mmol/L    Comment: HEMOLYSIS AT THIS LEVEL MAY AFFECT RESULT CRITICAL RESULT CALLED TO, READ BACK BY AND VERIFIED WITH T SHEARIN RN 03/01/23 2101 M KOROLESKI    Chloride 94 (L) 98 - 111 mmol/L   CO2 26 22 - 32 mmol/L   Glucose, Bld 108 (H) 70 - 99 mg/dL    Comment: Glucose reference range applies only to samples taken after fasting for at least 8 hours.   BUN 62 (H) 8 - 23 mg/dL   Creatinine, Ser 4.01 (H) 0.61 - 1.24 mg/dL   Calcium 9.1 8.9 - 02.7 mg/dL   GFR, Estimated 6 (L) >60 mL/min    Comment: (NOTE) Calculated using the CKD-EPI Creatinine Equation (2021)    Anion gap 15 5 - 15    Comment: Performed at [facility], [address] [place], [place] [address]  Blood culture (routine x 2)     Status: None (Preliminary result)   Collection Time: 03/01/23  8:00 PM   Specimen: BLOOD  Result Value Ref Range   Specimen Description BLOOD LEFT ANTECUBITAL    Special Requests      BOTTLES DRAWN AEROBIC AND ANAEROBIC Blood Culture adequate volume   Culture      NO GROWTH < 12 HOURS Performed at [facility], [address], [place], [place] [address]    Report Status PENDING   Vancomycin, random     Status: None   Collection Time: 03/01/23  8:00 PM  Result Value Ref Range   Vancomycin Rm 16 ug/mL    Comment:         Random Vancomycin therapeutic range is dependent on dosage and time of specimen collection. A peak range is 20-40 ug/mL A trough range is 5-15 ug/mL        Performed at [facility], [address] [place], [place] [address]   Hepatic function panel     Status: Abnormal   Collection Time: 03/01/23  8:00 PM  Result Value Ref Range   Total Protein 8.3 (H) 6.5 - 8.1 g/dL   Albumin 3.4 (L) 3.5 - 5.0 g/dL   AST 46 (H) 15 - 41 U/L    Comment: HEMOLYSIS AT THIS LEVEL MAY AFFECT RESULT   ALT 25 0 - 44 U/L    Comment: HEMOLYSIS AT THIS LEVEL MAY AFFECT RESULT   Alkaline Phosphatase 83 38 - 126 U/L   Total Bilirubin 0.9 0.3 - 1.2 mg/dL    Comment: HEMOLYSIS AT THIS LEVEL MAY AFFECT RESULT   Bilirubin, Direct 0.4 (H) 0.0 - 0.2 mg/dL   Indirect Bilirubin 0.5 0.3 - 0.9 mg/dL    Comment: Performed at [facility], [address] [place], [place] [address]  Troponin I (High Sensitivity)     Status: Abnormal   Collection Time: 03/01/23  8:00 PM  Result Value Ref Range   Troponin I (High Sensitivity) 105 (HH) <18 ng/L    Comment: CRITICAL RESULT CALLED TO, READ BACK BY AND VERIFIED WITH T SHEARIN RN 03/01/23 2101 M KOROLESKI (NOTE) Elevated high sensitivity troponin I (hsTnI) values and significant  changes across serial measurements may suggest ACS but many other  chronic and acute conditions are known to elevate hsTnI results.  Refer to the "Links" section for chest pain algorithms and additional  guidance. Performed at [facility], [address] [place], [place] [address]   Basic metabolic panel     Status: Abnormal   Collection Time: 03/01/23  8:00 PM  Result Value Ref Range   Sodium 136 135 - 145 mmol/L   Potassium 6.7 (HH) 3.5 - 5.1 mmol/L    Comment: HEMOLYSIS AT THIS LEVEL MAY AFFECT RESULT CRITICAL RESULT CALLED TO, READ BACK BY AND VERIFIED WITH T SHEARIN RN 03/01/23 2233 M KOROLESKI    Chloride 94 (L) 98 - 111 mmol/L   CO2 23 22 - 32 mmol/L    Glucose, Bld 104 (H) 70 - 99 mg/dL    Comment: Glucose reference range applies only to samples taken after fasting for at least 8 hours.   BUN 63 (H) 8 - 23 mg/dL   Creatinine, Ser 0.35 (H) 0.61 - 1.24 mg/dL   Calcium 9.2 8.9 - 59.7 mg/dL   GFR, Estimated 6 (L) >60 mL/min    Comment: (NOTE) Calculated using the CKD-EPI Creatinine Equation (2021)    Anion gap 19 (H) 5 - 15    Comment: Performed at [facility], [address], [place], [place] [address]  Hepatitis B surface antigen     Status: None   Collection Time: 03/01/23  8:00 PM  Result Value Ref Range   Hepatitis B Surface Ag NON REACTIVE NON REACTIVE    Comment: Performed at [facility], [address], [place], [place] [address]  Blood culture (routine x 2)     Status: None (Preliminary result)   Collection Time: 03/01/23  8:20 PM   Specimen: BLOOD RIGHT HAND  Result Value Ref Range   Specimen Description BLOOD RIGHT HAND    Special Requests      BOTTLES DRAWN AEROBIC AND ANAEROBIC Blood Culture adequate volume   Culture      NO GROWTH < 12 HOURS Performed at [facility], [address], [place], [place] [address]    Report Status PENDING   Basic metabolic panel     Status: Abnormal   Collection Time: 03/01/23 10:50 PM  Result Value Ref Range   Sodium 134 (L) 135 - 145 mmol/L   Potassium 6.3 (HH) 3.5 - 5.1 mmol/L    Comment: ATTEMPTED CALL TO [phone] 03/01/23 2355 M KOROLESKI CRITICAL RESULT CALLED TO, READ BACK BY AND VERIFIED WITH T SHEARIN RN 03/02/23 0017 M KOROLESKI    Chloride 96 (L) 98 - 111 mmol/L   CO2 23 22 - 32 mmol/L   Glucose, Bld 86 70 - 99 mg/dL    Comment: Glucose reference range applies only to samples taken after fasting for at least 8 hours.   BUN 63 (H) 8 - 23 mg/dL   Creatinine, Ser 1.22 (H) 0.61 - 1.24 mg/dL   Calcium 9.6 8.9 - 48.2 mg/dL   GFR, Estimated 7 (L) >60 mL/min    Comment: (NOTE) Calculated using the CKD-EPI Creatinine Equation (2021)    Anion gap 15 5 - 15     Comment: Performed at [facility], [address] [place], [place] [address]  Renal function panel     Status: Abnormal   Collection Time: 03/02/23  4:15 AM  Result Value Ref Range   Sodium 134 (L)  135 - 145 mmol/L   Potassium 5.5 (H) 3.5 - 5.1 mmol/L    Comment: HEMOLYSIS AT THIS LEVEL MAY AFFECT RESULT   Chloride 95 (L) 98 - 111 mmol/L   CO2 20 (L) 22 - 32 mmol/L   Glucose, Bld 77 70 - 99 mg/dL    Comment: Glucose reference range applies only to samples taken after fasting for at least 8 hours.   BUN 64 (H) 8 - 23 mg/dL   Creatinine, Ser 1.61 (H) 0.61 - 1.24 mg/dL   Calcium 8.4 (L) 8.9 - 10.3 mg/dL   Phosphorus 5.4 (H) 2.5 - 4.6 mg/dL   Albumin 3.1 (L) 3.5 - 5.0 g/dL   GFR, Estimated 7 (L) >60 mL/min    Comment: (NOTE) Calculated using the CKD-EPI Creatinine Equation (2021)    Anion gap 19 (H) 5 - 15    Comment: Performed at [facility], [address], [place], [place] [address]  CBC     Status: Abnormal   Collection Time: 03/02/23  4:15 AM  Result Value Ref Range   WBC 7.2 4.0 - 10.5 K/uL   RBC 4.23 4.22 - 5.81 MIL/uL   Hemoglobin 11.6 (L) 13.0 - 17.0 g/dL   HCT 54.0 98.1 - 19.1 %   MCV 92.4 80.0 - 100.0 fL   MCH 27.4 26.0 - 34.0 pg   MCHC 29.7 (L) 30.0 - 36.0 g/dL   RDW 47.8 (H) 29.5 - 62.1 %   Platelets 228 150 - 400 K/uL   nRBC 0.0 0.0 - 0.2 %    Comment: Performed at [facility], [address] [place], [place] [address]   DG Chest Portable 1 View  Result Date: 03/01/2023 CLINICAL DATA:  Chest pain. EXAM: PORTABLE CHEST 1 VIEW COMPARISON:  05/10/2018 FINDINGS: Mild cardiomegaly is seen. Prior CABG noted. Right jugular dual-lumen central venous catheter is seen with TIPS overlying the right atrium. No evidence of pneumothorax. Both lungs clear.  No evidence of pleural effusion. IMPRESSION: Mild cardiomegaly. No active lung disease. Electronically Signed   By: Danae Orleans M.D.   On: 03/01/2023 17:50    Pending Labs Unresulted Labs (From  admission, onward)     Start     Ordered   03/03/23 0500  Comprehensive metabolic panel  Tomorrow morning,   R        03/02/23 1432   03/03/23 0500  CBC  Tomorrow morning,   R        03/02/23 1432            Vitals/Pain Today's Vitals   03/02/23 1254 03/02/23 1713 03/02/23 1800 03/02/23 1900  BP:   (!) 154/53 133/61  Pulse:   88 75  Resp:   14 13  Temp:  98 F (36.7 C)    TempSrc:  Oral    SpO2:   95% 91%  Weight: 63.5 kg     Height: 5\' 5"  (1.651 m)     PainSc: 10-Worst pain ever       Isolation Precautions No active isolations  Medications Medications  aspirin chewable tablet 81 mg (0 mg Oral Hold 03/02/23 1907)  oxyCODONE (Oxy IR/ROXICODONE) immediate release tablet 10 mg (10 mg Oral Given 03/02/23 1557)  carvedilol (COREG) tablet 25 mg (0 mg Oral Hold 03/02/23 1907)  atorvastatin (LIPITOR) tablet 80 mg (0 mg Oral Hold 03/02/23 1907)  hydrALAZINE (APRESOLINE) tablet 100 mg (0 mg Oral Hold 03/02/23 1908)  isosorbide mononitrate (IMDUR) 24 hr tablet 60 mg (  0 mg Oral Hold 03/02/23 1908)  lisinopril (ZESTRIL) tablet 10 mg (0 mg Oral Hold 03/02/23 1908)  minoxidil (LONITEN) tablet 10 mg (0 mg Oral Hold 03/02/23 1908)  amitriptyline (ELAVIL) tablet 10 mg (has no administration in time range)  mirtazapine (REMERON) tablet 7.5 mg (has no administration in time range)  famotidine (PEPCID) tablet 40 mg (0 mg Oral Hold 03/02/23 1908)  sevelamer carbonate (RENVELA) tablet 1,600-2,400 mg (has no administration in time range)  clopidogrel (PLAVIX) tablet 75 mg (0 mg Oral Hold 03/02/23 1908)  apixaban (ELIQUIS) tablet 5 mg (0 mg Oral Hold 03/02/23 1906)  acetaminophen (TYLENOL) tablet 650 mg (has no administration in time range)    Or  acetaminophen (TYLENOL) suppository 650 mg (has no administration in time range)  traZODone (DESYREL) tablet 25 mg (has no administration in time range)  senna-docusate (Senokot-S) tablet 1 tablet (has no administration in time range)  ondansetron (ZOFRAN)  tablet 4 mg (has no administration in time range)    Or  ondansetron (ZOFRAN) injection 4 mg (has no administration in time range)  albuterol (PROVENTIL) (2.5 MG/3ML) 0.083% nebulizer solution 2.5 mg (has no administration in time range)  HYDROmorphone (DILAUDID) injection 0.5 mg (has no administration in time range)  traMADol (ULTRAM) tablet 50 mg (50 mg Oral Given 03/02/23 1557)    Mobility walks     Focused Assessments Cardiac Assessment Handoff:    Lab Results  Component Value Date   TROPONINI 0.18 (HH) 05/10/2018   No results found for: "DDIMER" Does the Patient currently have chest pain? Yes    R Recommendations: See Admitting Provider Note  Report given to:   Additional Notes: Pt has AMA from two different facilities. AMA from [facility] and AMA from [facility] earlier today, to walked to [facility] from [facility] and now to be admitted back to [facility]. Pt can be irritable at times, but pt is cooperative at this time. Questionable from provider Dr. Rodena Medin if pt needs a psych eval for hospital "jumping" and leaving AMA. Schedule meds from earlier today not given d/t not verify from pharmacy as of this time.

## 2023-03-02 NOTE — Consult Note (Signed)
  Psych consult received for competency evaluation as patient has left AMA from multiple facilities, inconsistent reasoning, etc.  Competency evaluation is determined by the courts, unfortunately that is not a service that we can offer as a consult service.  Chart review does show patient has multiple admissions, to multiple facilities.  During these admissions it was determined that patient did have capacity and allowed to leave and subsequently will present to the hospital for emergency assistance.  Patient again presents today with similar complaints of chest pain, however has also left AMA several times today.  -Consider filing an APS report if there continues to be a concern about patient safety or overall wellbeing.  If patient is felt to be a danger to self or others, consider initiate an involuntary commitment for medical purposes (i.e. dialysis dependent, electrolyte abnormalities that can potentially be fatal if not treated, attempting to leave AMA despite necessary medical procedures needed to prevent death) -Patient does appear to be aware of his risk versus benefits, knowledge of emergency services and when to seek help. -  Will discontinue competency evaluation at this time, as that is not a service that is offered by our team.

## 2023-03-02 NOTE — Progress Notes (Signed)
ANTICOAGULATION CONSULT NOTE - Initial Consult  Pharmacy Consult for heparin Indication: chest pain/ACS and atrial fibrillation  Allergies  Allergen Reactions   Penicillins Anaphylaxis    Has patient had a PCN reaction causing immediate rash, facial/tongue/throat swelling, SOB or lightheadedness with hypotension: Unknown Has patient had a PCN reaction causing severe rash involving mucus membranes or skin necrosis: Unknown Has patient had a PCN reaction that required hospitalization: Unknown Has patient had a PCN reaction occurring within the last 10 years: No If all of the above answers are "NO", then may proceed with Cephalosporin use.    Patient Measurements: Height: 5\' 5"  (165.1 cm) Weight: 63.5 kg (140 lb) IBW/kg (Calculated) : 61.5  Vital Signs: Temp: 97.9 F (36.6 C) (04/14 2032) Temp Source: Oral (04/14 2032) BP: 117/65 (04/15 0000) Pulse Rate: 77 (04/14 1945)  Labs: Recent Labs    03/01/23 1730 03/01/23 2000 03/01/23 2250  HGB 12.5*  --   --   HCT 40.4  --   --   PLT 219  --   --   CREATININE 8.46* 8.75*  8.76* 8.46*  TROPONINIHS 86* 105*  --     Estimated Creatinine Clearance: 8 mL/min (A) (by C-G formula based on SCr of 8.46 mg/dL (H)).   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   Candidiasis of mouth    Cardiomyopathy (HCC)    Chest pain, unspecified    Diseases of lips    Angular cheilitis   HIV infection (HCC)    Hypertension    Lymphoma (HCC)    Personal history of unspecified disease of respiratory system    Pneumocystosis (HCC)    Shingles    Tobacco use     Assessment: 61yo male left AMA from [facility] this am after stay for aortic valve endocarditis, continued to feel worse throughout the morning and presented to [facility]; pt was started on ABX and now to transition from apixaban (Afib, last dose 4/14 am at [facility]) to heparin for CP and elevated troponins.  Goal of Therapy:   Heparin level 0.3-0.7 units/ml aPTT 66-102 seconds Monitor platelets by anticoagulation protocol: Yes   Plan:  Begin heparin infusion at 900 units/hr. Monitor heparin levels, aPTT (while apixaban affects anti-Xa assay), and CBC.  Vernard Gambles, PharmD, BCPS  03/02/2023,12:24 AM

## 2023-03-02 NOTE — ED Provider Notes (Signed)
[place] EMERGENCY DEPARTMENT AT [facility] Provider Note   CSN: [id number] Arrival date & time: 03/02/23  1240     History  Chief Complaint  Patient presents with   Chest Pain    [name] is a 62 y.o. male.  62 year old male with prior medical history as detailed below presents for evaluation.  Patient left [facility] AMA  shortly after admission.  He apparently left right around noon.  He apparently walked to [employer].  He checked into the ED reporting that he feels weak.  See recent charting in EPIC.  Patient with multiple recent AMA events.  Patient seems to be agreeable with plan to admit.  He does understand that he needs to be admitted to the hospital for further evaluation and treatment.    The history is provided by the patient and medical records.       Home Medications Prior to Admission medications   Medication Sig Start Date End Date Taking? Authorizing Provider  amiodarone (PACERONE) 200 MG tablet Take 200 mg by mouth daily.    [provider]  amitriptyline (ELAVIL) 10 MG tablet Take 10 mg by mouth at bedtime. 12/04/21   [provider]  aspirin 81 MG chewable tablet Chew 1 tablet (81 mg total) by mouth daily. With Food 05/27/18   Shon Hale, MD  atorvastatin (LIPITOR) 80 MG tablet Take 80 mg by mouth daily. 01/04/23   [provider]  carvedilol (COREG) 12.5 MG tablet Take 1 tablet (12.5 mg total) by mouth 2 (two) times daily with a meal. Patient taking differently: Take 25 mg by mouth 2 (two) times daily with a meal. 05/26/18   Emokpae, Courage, MD  clopidogrel (PLAVIX) 75 MG tablet Take 75 mg by mouth daily. 10/03/22   [provider]  D3-50 1.25 MG (50000 UT) capsule Take 50,000 Units by mouth once a week. 01/27/23   [provider]  dapsone 100 MG tablet Take 1 tablet (100 mg total) by mouth daily. 05/26/18   Shon Hale, MD  dolutegravir (TIVICAY) 50 MG tablet Take 1 tablet (50  mg total) by mouth daily. 05/26/18   Emokpae, Courage, MD  ELIQUIS 5 MG TABS tablet Take 5 mg by mouth 2 (two) times daily. 05/17/22   [provider]  famotidine (PEPCID) 40 MG tablet Take 40 mg by mouth daily. 01/04/23   [provider]  hydrALAZINE (APRESOLINE) 100 MG tablet Take 100 mg by mouth 3 (three) times daily.    [provider]  isosorbide mononitrate (IMDUR) 60 MG 24 hr tablet Take 60 mg by mouth daily. 01/04/23   [provider]  lisinopril (ZESTRIL) 10 MG tablet Take 10 mg by mouth daily. 02/16/23   [provider]  methocarbamol (ROBAXIN) 500 MG tablet Take 500 mg by mouth 4 (four) times daily.    [provider]  minoxidil (LONITEN) 10 MG tablet Take 10 mg by mouth 2 (two) times daily. 08/16/21   [provider]  mirtazapine (REMERON) 7.5 MG tablet Take 7.5 mg by mouth at bedtime. 01/04/23   [provider]  nitroGLYCERIN (NITROSTAT) 0.4 MG SL tablet Place 0.4 mg under the tongue every 5 (five) minutes as needed for chest pain. 04/27/19   [provider]  Oxycodone HCl 10 MG TABS Take 10 mg by mouth every 8 (eight) hours as needed (for pain). 10/02/22   [provider]  rilpivirine (EDURANT) 25 MG TABS tablet Take 1 tablet (25 mg total)  by mouth daily with breakfast. 05/27/18   Shon Hale, MD  sevelamer carbonate (RENVELA) 800 MG tablet Take 1,600-2,400 mg by mouth See admin instructions. Take 3 tablets by mouth three times daily with meals and 2 tablets twice daily with snacks 12/04/21   [provider]      Allergies    Penicillins    Review of Systems   Review of Systems  All other systems reviewed and are negative.   Physical Exam Updated Vital Signs BP (!) 156/73 (BP Location: Left Arm)   Pulse 87   Temp 97.9 F (36.6 C) (Oral)   Resp 16   Ht  (1.651 m)   Wt 63.5 kg   SpO2 100%   BMI 23.30 kg/m  Physical Exam Vitals and nursing note reviewed.  Constitutional:       General: He is not in acute distress.    Appearance: Normal appearance. He is well-developed.  HENT:     Head: Normocephalic and atraumatic.  Eyes:     Conjunctiva/sclera: Conjunctivae normal.     Pupils: Pupils are equal, round, and reactive to light.  Cardiovascular:     Rate and Rhythm: Normal rate and regular rhythm.     Heart sounds: Normal heart sounds.  Pulmonary:     Effort: Pulmonary effort is normal. No respiratory distress.     Breath sounds: Normal breath sounds.  Chest:     Comments: Dialysis catheter is present in her right anterior chest wall. Abdominal:     General: There is no distension.     Palpations: Abdomen is soft.     Tenderness: There is no abdominal tenderness.  Musculoskeletal:        General: No deformity. Normal range of motion.     Cervical back: Normal range of motion and neck supple.  Skin:    General: Skin is warm and dry.  Neurological:     General: No focal deficit present.     Mental Status: He is alert and oriented to person, place, and time.     ED Results / Procedures / Treatments   Labs (all labs ordered are listed, but only abnormal results are displayed) Labs Reviewed - No data to display  EKG EKG Interpretation  Date/Time:  Monday March 02 2023 12:57:52 EDT Ventricular Rate:  84 PR Interval:  249 QRS Duration: 116 QT Interval:  425 QTC Calculation: 503 R Axis:   -48 Text Interpretation: Sinus rhythm Prolonged PR interval Probable left atrial enlargement LAD, consider left anterior fascicular block LVH with secondary repolarization abnormality Confirmed by Kristine Royal [phone]) on 03/02/2023 1:01:16 PM  Radiology DG Chest Portable 1 View  Result Date: 03/01/2023 CLINICAL DATA:  Chest pain. EXAM: PORTABLE CHEST 1 VIEW COMPARISON:  05/10/2018 FINDINGS: Mild cardiomegaly is seen. Prior CABG noted. Right jugular dual-lumen central venous catheter is seen with TIPS overlying the right atrium. No evidence of pneumothorax. Both  lungs clear.  No evidence of pleural effusion. IMPRESSION: Mild cardiomegaly. No active lung disease. Electronically Signed   By: Danae Orleans M.D.   On: 03/01/2023 17:50    Procedures Procedures    Medications Ordered in ED Medications - No data to display  ED Course/ Medical Decision Making/ A&P                             Medical Decision Making Risk Decision regarding hospitalization.    Medical Screen Complete  This patient presented  to the ED with complaint of weakness.  This complaint involves an extensive number of treatment options. The initial differential diagnosis includes, but is not limited to, Bolick abnormality, infection, etc.  This presentation is: Acute, Chronic, Self-Limited, Previously Undiagnosed, Uncertain Prognosis, Complicated, Systemic Symptoms, and Threat to Life/Bodily Function  Patient presents roughly one hour after leaving AMA from [facility].  Patient was admitted for multiple issues including ESRD and possible endocarditis.  Patient is agreeable with plan to admit.  Patient does understand that he will require admission to [facility] given his history of ESRD and current dependence on dialysis.  Hospitalist service made aware of case and will evaluate for admission.  Additional history obtained:  External records from outside sources obtained and reviewed including prior ED visits and prior Inpatient records.   Cardiac Monitoring:  The patient was maintained on a cardiac monitor.  I personally viewed and interpreted the cardiac monitor which showed an underlying rhythm of: NSR   Problem List / ED Course:  ESRD on HD, Weakness   Reevaluation:  After the interventions noted above, I reevaluated the patient and found that they have: improved   Disposition:  After consideration of the diagnostic results and the patients response to treatment, I feel that the patent would benefit from admission.          Final Clinical  Impression(s) / ED Diagnoses Final diagnoses:  Weakness    Rx / DC Orders ED Discharge Orders     None         Wynetta Fines, MD 03/02/23 1420

## 2023-03-02 NOTE — Progress Notes (Signed)
Pharmacy Antibiotic Note  [name] is a 62 y.o. male for which pharmacy has been consulted for vancomycin dosing for  endocarditis .  Patient with a history of ESRD on HD, HIV, HTN, DM, CAD with prior CABG in 2022, paroxysmal afib. Recently seen at [facility] for unstable angina and aortic valve endocarditis (4/3-11 left AMA; returned 4/13 but left AMA again).   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  Pt with HD this morning - tolerated 4h with BFR 400 ml/min.  Plan: Vancomycin random on arrival to ED 16 mcg/ml, which is therapeutic --Receiving 1g during HD at [facility] --Will resume Vanc 1gm IV qMWF Ceftriaxone per MD F/u ID recommendations F/u HD schedule/tolerance and micro data  Height: 5\' 5"  (165.1 cm) Weight:  (unable to weigh. ED stretcher do not have weighing scale.) IBW/kg (Calculated) : 61.5  Temp (24hrs), Avg:98.1 F (36.7 C), Min:97.9 F (36.6 C), Max:98.3 F (36.8 C)  Recent Labs  Lab 03/01/23 1730 03/01/23 2000 03/01/23 2250 03/02/23 0415  WBC 6.7  --   --  7.2  CREATININE 8.46* 8.75*  8.76* 8.46* 8.39*  VANCORANDOM  --  16  --   --      Estimated Creatinine Clearance: 8 mL/min (A) (by C-G formula based on SCr of 8.39 mg/dL (H)).    Allergies  Allergen Reactions   Penicillins Anaphylaxis    Has patient had a PCN reaction causing immediate rash, facial/tongue/throat swelling, SOB or lightheadedness with hypotension: Unknown Has patient had a PCN reaction causing severe rash involving mucus membranes or skin necrosis: Unknown Has patient had a PCN reaction that required hospitalization: Unknown Has patient had a PCN reaction occurring within the last 10 years: No If all of the above answers are "NO", then may proceed with Cephalosporin use.   Microbiology results: 4/14 BCx:  Thank you for allowing pharmacy to be a part of this patient's care.  Christoper Fabian, PharmD,  BCPS Please see amion for complete clinical pharmacist phone list 03/02/2023 11:32 AM

## 2023-03-02 NOTE — Assessment & Plan Note (Addendum)
Pt with aortic valve endocarditis seen on transesophageal echocardiogram done at [facility] on 02/20/2023.  Patient also had a mobile vegetation noted on his hemodialysis catheter that was in his right atrium.  Patient has not had his hemodialysis catheter changed as he left AMA twice from [facility] medical center.  Patient declines hospitalization here despite multiple conversations with him.  This was witnessed by his RN. RN will have patient sign AMA paperwork.

## 2023-03-03 DIAGNOSIS — I120 Hypertensive chronic kidney disease with stage 5 chronic kidney disease or end stage renal disease: Secondary | ICD-10-CM

## 2023-03-03 DIAGNOSIS — R4689 Other symptoms and signs involving appearance and behavior: Secondary | ICD-10-CM

## 2023-03-03 DIAGNOSIS — I480 Paroxysmal atrial fibrillation: Secondary | ICD-10-CM

## 2023-03-03 DIAGNOSIS — I5021 Acute systolic (congestive) heart failure: Secondary | ICD-10-CM | POA: Diagnosis not present

## 2023-03-03 DIAGNOSIS — B20 Human immunodeficiency virus [HIV] disease: Secondary | ICD-10-CM

## 2023-03-03 DIAGNOSIS — N186 End stage renal disease: Secondary | ICD-10-CM

## 2023-03-03 DIAGNOSIS — I358 Other nonrheumatic aortic valve disorders: Secondary | ICD-10-CM | POA: Diagnosis not present

## 2023-03-03 DIAGNOSIS — T827XXA Infection and inflammatory reaction due to other cardiac and vascular devices, implants and grafts, initial encounter: Secondary | ICD-10-CM

## 2023-03-03 DIAGNOSIS — F32A Depression, unspecified: Secondary | ICD-10-CM

## 2023-03-03 DIAGNOSIS — Z91199 Patient's noncompliance with other medical treatment and regimen due to unspecified reason: Secondary | ICD-10-CM

## 2023-03-03 DIAGNOSIS — I38 Endocarditis, valve unspecified: Secondary | ICD-10-CM | POA: Diagnosis not present

## 2023-03-03 DIAGNOSIS — K611 Rectal abscess: Secondary | ICD-10-CM

## 2023-03-03 DIAGNOSIS — I2510 Atherosclerotic heart disease of native coronary artery without angina pectoris: Secondary | ICD-10-CM

## 2023-03-03 DIAGNOSIS — Z992 Dependence on renal dialysis: Secondary | ICD-10-CM

## 2023-03-03 LAB — COMPREHENSIVE METABOLIC PANEL
ALT: 24 U/L (ref 0–44)
AST: 30 U/L (ref 15–41)
Albumin: 3.4 g/dL — ABNORMAL LOW (ref 3.5–5.0)
Alkaline Phosphatase: 76 U/L (ref 38–126)
Anion gap: 12 (ref 5–15)
BUN: 42 mg/dL — ABNORMAL HIGH (ref 8–23)
CO2: 28 mmol/L (ref 22–32)
Calcium: 8.6 mg/dL — ABNORMAL LOW (ref 8.9–10.3)
Chloride: 93 mmol/L — ABNORMAL LOW (ref 98–111)
Creatinine, Ser: 6.91 mg/dL — ABNORMAL HIGH (ref 0.61–1.24)
GFR, Estimated: 8 mL/min — ABNORMAL LOW (ref >60)
Glucose, Bld: 111 mg/dL — ABNORMAL HIGH (ref 70–99)
Potassium: 5 mmol/L (ref 3.5–5.1)
Sodium: 133 mmol/L — ABNORMAL LOW (ref 135–145)
Total Bilirubin: 0.5 mg/dL (ref 0.3–1.2)
Total Protein: 8.2 g/dL — ABNORMAL HIGH (ref 6.5–8.1)

## 2023-03-03 LAB — CULTURE, BLOOD (ROUTINE X 2): Special Requests: ADEQUATE

## 2023-03-03 LAB — CBC
HCT: 40.6 % (ref 39.0–52.0)
Hemoglobin: 12 g/dL — ABNORMAL LOW (ref 13.0–17.0)
MCH: 27 pg (ref 26.0–34.0)
MCHC: 29.6 g/dL — ABNORMAL LOW (ref 30.0–36.0)
MCV: 91.4 fL (ref 80.0–100.0)
Platelets: 231 10*3/uL (ref 150–400)
RBC: 4.44 MIL/uL (ref 4.22–5.81)
RDW: 19.2 % — ABNORMAL HIGH (ref 11.5–15.5)
WBC: 6.5 10*3/uL (ref 4.0–10.5)
nRBC: 0 % (ref 0.0–0.2)

## 2023-03-03 LAB — HEPATITIS B SURFACE ANTIBODY, QUANTITATIVE: Hep B S AB Quant (Post): 502 m[IU]/mL

## 2023-03-03 MED ORDER — HEPARIN SODIUM (PORCINE) 1000 UNIT/ML DIALYSIS
2000.0000 [IU] | Freq: Once | INTRAMUSCULAR | Status: AC
  Administered 2023-03-03: 2000 [IU] via INTRAVENOUS_CENTRAL
  Filled 2023-03-03: qty 2

## 2023-03-03 MED ORDER — SEVELAMER CARBONATE 800 MG PO TABS
2400.0000 mg | ORAL_TABLET | Freq: Three times a day (TID) | ORAL | Status: DC
  Administered 2023-03-03 – 2023-03-12 (×17): 2400 mg via ORAL
  Filled 2023-03-03 (×18): qty 3

## 2023-03-03 MED ORDER — HEPARIN SODIUM (PORCINE) 1000 UNIT/ML IJ SOLN
4600.0000 [IU] | Freq: Once | INTRAMUSCULAR | Status: AC
  Administered 2023-03-03: 4600 [IU]

## 2023-03-03 MED ORDER — HEPARIN SODIUM (PORCINE) 1000 UNIT/ML DIALYSIS
2000.0000 [IU] | INTRAMUSCULAR | Status: DC | PRN
  Filled 2023-03-03: qty 2

## 2023-03-03 MED ORDER — VANCOMYCIN HCL 1250 MG/250ML IV SOLN
1250.0000 mg | Freq: Once | INTRAVENOUS | Status: AC
  Administered 2023-03-03: 1250 mg via INTRAVENOUS
  Filled 2023-03-03: qty 250

## 2023-03-03 MED ORDER — SODIUM CHLORIDE 0.9 % IV SOLN
2.0000 g | INTRAVENOUS | Status: DC
  Administered 2023-03-03: 2 g via INTRAVENOUS
  Filled 2023-03-03 (×3): qty 20

## 2023-03-03 MED ORDER — LINEZOLID 600 MG PO TABS
600.0000 mg | ORAL_TABLET | Freq: Once | ORAL | Status: AC
  Administered 2023-03-03: 600 mg via ORAL
  Filled 2023-03-03: qty 1

## 2023-03-03 MED ORDER — CHLORHEXIDINE GLUCONATE CLOTH 2 % EX PADS
6.0000 | MEDICATED_PAD | Freq: Every day | CUTANEOUS | Status: DC
  Administered 2023-03-07 – 2023-03-12 (×3): 6 via TOPICAL

## 2023-03-03 MED ORDER — DAPSONE 100 MG PO TABS
100.0000 mg | ORAL_TABLET | Freq: Every day | ORAL | Status: DC
  Administered 2023-03-03 – 2023-03-12 (×9): 100 mg via ORAL
  Filled 2023-03-03 (×10): qty 1

## 2023-03-03 MED ORDER — VANCOMYCIN HCL 750 MG/150ML IV SOLN
750.0000 mg | Freq: Once | INTRAVENOUS | Status: DC
  Filled 2023-03-03: qty 150

## 2023-03-03 MED ORDER — CHLORHEXIDINE GLUCONATE CLOTH 2 % EX PADS
6.0000 | MEDICATED_PAD | Freq: Every day | CUTANEOUS | Status: DC
  Administered 2023-03-05 – 2023-03-07 (×3): 6 via TOPICAL

## 2023-03-03 MED ORDER — VANCOMYCIN VARIABLE DOSE PER UNSTABLE RENAL FUNCTION (PHARMACIST DOSING): Status: DC

## 2023-03-03 MED ORDER — DOLUTEGRAVIR-RILPIVIRINE 50-25 MG PO TABS
1.0000 | ORAL_TABLET | Freq: Every day | ORAL | Status: DC
  Administered 2023-03-03 – 2023-03-12 (×7): 1 via ORAL
  Filled 2023-03-03 (×10): qty 1

## 2023-03-03 MED ORDER — AMIODARONE HCL 200 MG PO TABS
200.0000 mg | ORAL_TABLET | Freq: Every day | ORAL | Status: DC
  Administered 2023-03-03 – 2023-03-12 (×10): 200 mg via ORAL
  Filled 2023-03-03 (×10): qty 1

## 2023-03-03 MED ORDER — SEVELAMER CARBONATE 800 MG PO TABS: 1600.0000 mg | ORAL_TABLET | Freq: Three times a day (TID) | ORAL | Status: DC | PRN

## 2023-03-03 MED ORDER — VANCOMYCIN HCL 750 MG/150ML IV SOLN
750.0000 mg | Freq: Once | INTRAVENOUS | Status: AC
  Administered 2023-03-03: 750 mg via INTRAVENOUS
  Filled 2023-03-03: qty 150

## 2023-03-03 MED ORDER — VANCOMYCIN HCL 750 MG/150ML IV SOLN: 750.0000 mg | Freq: Once | INTRAVENOUS | Status: DC

## 2023-03-03 NOTE — Hospital Course (Addendum)
[name] was admitted to the hospital with the working diagnosis of aortic valve endocarditis.   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 CT surgery determined patient was not a surgical candidate.  Patient left the hospital AMA, 04/11, then was re-hospitalized 04/13 but left again AMA on the 04/14.  Admitted to [facility] on 04/15 and left AMA later the same day.  At home with chest pain and presented back to the hospital.  On his initial physical examination his blood pressure was 156.73, HR 87, RR 16 and 02 saturation 100%, lungs with no wheezing or rales, heart with S1 and S2 present and rhythmic, abdomen with no distention and no lower extremity edema.   Na 134, K 5,5 Cl 95, bicarbonate 20, glucose 77 bun 64 cr 8.39 Wbc 7.7 hgb 11,6 plt 228   EKG 84 bpm, left axis deviation, left anterior fascicular block, qtc 503, sinus rhythm with 1st degree AV block, ST depression V5 and V6, no significant T wave changes. Positive LVH.

## 2023-03-03 NOTE — Assessment & Plan Note (Signed)
Continue antiretroviral therapy. Follow up with ID recommendations.

## 2023-03-03 NOTE — Assessment & Plan Note (Signed)
Patient with recent stent placement at [facility] on 12/2022 Patient had a in-stent re-stenosis and a drug eluding stent placed in the PDA, currently on dual antiplatelet therapy with aspirin, clopidogrel and statin.

## 2023-03-03 NOTE — Progress Notes (Signed)
Pharmacy Antibiotic Note  [name] is a 62 y.o. male admitted on 03/02/2023 with culture-negative  endocarditis .  Pharmacy has been consulted for vancomycin dosing. Pt is ESRD on HD TTS PTA. Pt was recently seen at [facility], TEE on 4/5 with aortic valve vegetation. Blood cultures from 4/4, 4/6, 4/7 with no growth final. Pharmacy consulted for Vancomycin and Rocephin dosing.   A Vancomycin random level on 4/14 upon presentation was 16 mcg/ml. The patient received HD on 4/15 for 4 hr at BFR 400, no Vancomycin dose was given post-HD. A dose was ordered to be given this AM and was charted on however HD-RN states bag was off and "still full". Floor RN realized bag was clamped and unclamped prior to HD but was turned off in the HD unit. The patient is currently receiving HD with the plan for 3 hours.  The patients Vancomycin level after both yesterday and today's treatment is estimated to be 6 mcg/ml. Will give a higher maintenance dose after HD today to make-up concentrations and recheck a Vancomycin random level in the AM.   16 mcg/ml >> 9 mcg/ml >> 6 mcg/ml  Plan: - Vancomycin 1250 mg IV x 1 dose after HD today - Follow up Karius test and blood cultures - Vancomycin random level on 4/17 AM - Will continue to follow HD schedule/duration, culture results, LOT, and antibiotic de-escalation plans   Height:  (165.1 cm) Weight: 64.9 kg (143 lb 1.3 oz) (bed) IBW/kg (Calculated) : 61.5  Temp (24hrs), Avg:97.9 F (36.6 C), Min:97.6 F (36.4 C), Max:98.2 F (36.8 C)  Recent Labs  Lab 03/01/23 1730 03/01/23 2000 03/01/23 2250 03/02/23 0415 03/03/23 0409  WBC 6.7  --   --  7.2 6.5  CREATININE 8.46* 8.75*  8.76* 8.46* 8.39* 6.91*  VANCORANDOM  --  16  --   --   --      Estimated Creatinine Clearance: 9.8 mL/min (A) (by C-G formula based on SCr of 6.91 mg/dL (H)).    Allergies  Allergen Reactions   Penicillins Anaphylaxis    Has patient had a PCN reaction causing immediate rash,  facial/tongue/throat swelling, SOB or lightheadedness with hypotension: Unknown Has patient had a PCN reaction causing severe rash involving mucus membranes or skin necrosis: Unknown Has patient had a PCN reaction that required hospitalization: Unknown Has patient had a PCN reaction occurring within the last 10 years: No If all of the above answers are "NO", then may proceed with Cephalosporin use.   Pepcid [Famotidine] Other (See Comments)   Prilosec [Omeprazole] Other (See Comments)    Antimicrobials this admission: 4/16 vancomycin >>  4/16 ceftriaxone >>   Microbiology results: 4/14 BCx: ngtd  Thank you for allowing pharmacy to be a part of this patient's care.  Georgina Pillion, PharmD, BCPS Infectious Diseases Clinical Pharmacist 03/03/2023 3:00 PM   **Pharmacist phone directory can now be found on [email or web address] ([email or web address]).  Listed under [facility].

## 2023-03-03 NOTE — Assessment & Plan Note (Addendum)
Hyperkalemia. Hyponatremia,   Plan for renal replacement therapy today, then HD tunneled catheter will be removed for line holiday.  Continue to follow up with nephrology for inpatient renal replacement therapy. His regular schedule is Tues-Thursday and Saturday.   Continue with phosphate binders.

## 2023-03-03 NOTE — ED Notes (Signed)
Pt appears to be comfortable and resting, can observe even RR that are unlabored, pt remains on cardiac monitoring devices, no changes noted, side rails up x2 for safety, NAD noted, plan of care ongoing, call light within reach, no further concerns as of present.   

## 2023-03-03 NOTE — Assessment & Plan Note (Signed)
Continue anticoagulation with apixaban, Rate control with carvedilol and amiodarone.

## 2023-03-03 NOTE — Progress Notes (Signed)
Progress Note   Patient: [name] UEA:[id number] DOB: January 29, 1961 DOA: 03/02/2023     1 DOS: the patient was seen and examined on 03/03/2023   Brief hospital course: [name] was admitted to the hospital with the working diagnosis of aortic valve endocarditis.   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 CT surgery determined patient was not a surgical candidate.  Patient left the hospital AMA, 04/11, then was re-hospitalized 04/13 but left again AMA on the 04/14.  Admitted to [facility] on 04/15 and left AMA later the same day.  At home with chest pain and presented back to the hospital.  On his initial physical examination his blood pressure was 156.73, HR 87, RR 16 and 02 saturation 100%, lungs with no wheezing or rales, heart with S1 and S2 present and rhythmic, abdomen with no distention and no lower extremity edema.   Na 134, K 5,5 Cl 95, bicarbonate 20, glucose 77 bun 64 cr 8.39 Wbc 7.7 hgb 11,6 plt 228   EKG 84 bpm, left axis deviation, left anterior fascicular block, qtc 503, sinus rhythm with 1st degree AV block, ST depression V5 and V6, no significant T wave changes. Positive LVH.     Assessment and Plan: * Aortic valve endocarditis Aortic root abscess.  HD catheter tip infection.   Plan to continue antibiotic therapy with IV vancomycin and cefepime.  Potential antibiotic duration 6 weeks.  Patient not candidate for valve surgery.  HD catheter will be removed for catheter holiday.  Follow up with wound care recommendations for perirectal wounds.   ESRD on hemodialysis Hyperkalemia. Hyponatremia,   Plan for renal replacement therapy today, then HD tunneled catheter will be removed for line holiday.  Continue to follow up with  nephrology for inpatient renal replacement therapy. His regular schedule is Tues-Thursday and Saturday.   Continue with phosphate binders.   CHF (congestive heart failure) No clinical signs of decompensation. Continue blood pressure monitoring.  On carvedilol, hydralazine and lisinopril.   Hypertension Continue blood pressure control with carvedilol, isosorbide, hydralazine and lisinopril. Patient on minoxidil 7,5 mg daily.   Human immunodeficiency virus (HIV) disease (HCC) Continue antiretroviral therapy. Follow up with ID recommendations.   Coronary artery disease Patient with recent stent placement at [facility] on 12/2022 Patient had a in-stent re-stenosis and a drug eluding stent placed in the PDA, currently on dual antiplatelet therapy with aspirin, clopidogrel and statin.    Paroxysmal atrial fibrillation Continue anticoagulation with apixaban, Rate control with carvedilol and amiodarone.   Depression Continue with amitriptyline and mirtazapine.   Patient with agitation and confusion on admission.  Continue pain control for chronic pain syndrome. Neuro checks per unit protocol.         Subjective: Patient with no chest pain or dyspnea, examined during HD.   Physical Exam: Vitals:   03/03/23 1430 03/03/23 1500 03/03/23 1530 03/03/23 1600  BP: (!) 96/49 (!) 107/51 (!) 110/47 (!) 100/38  Pulse: 61 61 64 64  Resp: 16 (!) Temp:      TempSrc:      SpO2: 97% 98% 97% 96%  Weight:      Height:       Neurology mild somnolent but easy to arouse, responds to questions and  follows commands ENT with mild pallor Cardiovascular with S1 and S2 present and rhythmic with no gallops, rubs or murmurs Respiratory with no rales or wheezing, no rhonchi Abdomen with no distention  No lower extremity edema  Data Reviewed:    Family Communication: no family at the bedside   Disposition: Status is: Inpatient Remains inpatient appropriate because: IV antibiotic therapy    Planned Discharge Destination: Home      Author: Coralie Keens, MD 03/03/2023 4:37 PM  For on call review [email or web address].

## 2023-03-03 NOTE — ED Notes (Signed)
[facility] called for pt transport  

## 2023-03-03 NOTE — Progress Notes (Signed)
Pt. Refused PIV start as of this time, pt. Stated that he had PIV earlier and got infiltrated. Swelling noted on r forearm, Left arm is restricted. A dialysis patient. Alert and oriented x4. RN made aware and to notify MD.

## 2023-03-03 NOTE — Progress Notes (Signed)
Received patient in bed to unit.  Alert and oriented.  Informed consent signed and in chart.   TX duration: 3  Patient tolerated well.  Transported back to the room  Alert, without acute distress.  Hand-off given to patient's nurse.   Access used: right [facility] Access issues: none  Total UF removed: 2L Medication(s) given: none    03/03/23 1638  Vitals  BP (!) 103/50  MAP (mmHg) 65  BP Location Right Arm  BP Method Automatic  Patient Position (if appropriate) Lying  Pulse Rate 68  Pulse Rate Source Monitor  ECG Heart Rate 69  Resp 17  Oxygen Therapy  SpO2 97 %  O2 Device Nasal Cannula  O2 Flow Rate (L/min) 2 L/min  During Treatment Monitoring  Blood Flow Rate (mL/min) 400 mL/min  Arterial Pressure (mmHg) -180 mmHg  Venous Pressure (mmHg) 220 mmHg  TMP (mmHg) 1 mmHg  Ultrafiltration Rate (mL/min) 793 mL/min  Dialysate Flow Rate (mL/min) 300 ml/min  HD Safety Checks Performed Yes  Intra-Hemodialysis Comments Tx completed  Dialysis Fluid Bolus Normal Saline  Bolus Amount (mL) 300 mL    [name]

## 2023-03-03 NOTE — Assessment & Plan Note (Addendum)
Continue with amitriptyline and mirtazapine.   Patient with agitation and confusion on admission.  Continue pain control for chronic pain syndrome. Neuro checks per unit protocol.

## 2023-03-03 NOTE — Progress Notes (Signed)
Pt refused CHG bath.  He said that the CHG wipes gives him  a rash and causes itching.

## 2023-03-03 NOTE — Assessment & Plan Note (Signed)
Continue blood pressure control with carvedilol, isosorbide, hydralazine and lisinopril. Patient on minoxidil 7,5 mg daily.

## 2023-03-03 NOTE — Consult Note (Signed)
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 Component of dementia  Plan:  Agree that HD catheter removal is critical  Will continue vancomycin and While He Is Here in the Hospital What but Would Plan to Use Vancomycin and Cefepime with Hemodialysis complete 6 weeks of therapy unless a specific pathogen is identified through the Karius test done at [facility] re his perirectal area and General Surgery may be needed if he needs an I and D to help with a potential source of his endocarditis Change ARV to JULUCA--validate his 2 drugs into 1 pill once daily.  This needs to be taken with food and with avoidance of proton pump inhibitors and H2 blockers completely. Tums woul dhave to be spaced appropriately from these We need a plan for management of his HIV when he leaves, perhaps in [place] for short term while he is staying with girlfriend and then in [place] unless he is giong to commute to our clinic for care--he was previously followed by Dr. Ninetta Lights  Principal Problem:   Endocarditis determined by echocardiography   Scheduled Meds:  amitriptyline  10 mg Oral QHS   apixaban  5 mg Oral BID   aspirin  81 mg Oral Daily   atorvastatin  80 mg Oral Daily   carvedilol  25 mg Oral BID WC   Chlorhexidine Gluconate Cloth  6 each Topical Daily   [START ON 03/04/2023] Chlorhexidine Gluconate Cloth  6 each Topical Q0600   clopidogrel  75 mg Oral Daily   dapsone  100 mg Oral Daily   dolutegravir-rilpivirine  1 tablet Oral Q lunch   heparin  2,000 Units Dialysis Once in dialysis    hydrALAZINE  100 mg Oral TID   isosorbide mononitrate  60 mg Oral Daily   lisinopril  10 mg Oral Daily   minoxidil  10 mg Oral BID   mirtazapine  7.5 mg Oral QHS   sevelamer carbonate  2,400 mg Oral TID WC   vancomycin variable dose per unstable renal function (pharmacist dosing)   Does not apply See admin instructions   Continuous Infusions:  cefTRIAXone (ROCEPHIN)  IV 2 g (03/03/23 0931)   vancomycin 750 mg (03/03/23 1203)   PRN Meds:.acetaminophen **OR** acetaminophen, albuterol, heparin, HYDROmorphone (DILAUDID) injection, ondansetron **OR** ondansetron (ZOFRAN) IV, oxyCODONE, senna-docusate, sevelamer carbonate, traMADol, traZODone  HPI: [name] is a 62 y.o. male   62 y.o. male with medical problems significant for HIV, ESRD on iHD, CAD s/p 4V CABG 2022 c/b ischemic cardiomyopathy and atrial fibrillation who was admitted on 02/18/2023 to [facility] as a transfer from [facility] for workup of chest pain. New aortic and mitral valve vegetations.   TTE on admission concerning for new aortic valve and mitral valve vegetations. TEE was performed 4/5, confirmed vegetation on bicuspid AoV associated with moderate AR. No aortic root abscess. Catheter extending into IVC with small mobil target also c/f vegetation. No Mitral valve vegetation noted. Interestingly, blood cultures (prior to abx) negative on 4/4, 4/6, 4/7 (no growth until  4/11, final report pending). ID was consulted. Started on broad spectrum IV vancomycin (4/8-4/11) and IV ceftriaxone (4/8-4/11). Work-up included Karius testing (pending). flelt that the AoV mass most likely vegetation. CTS was consulted but decided against surgical approach given patient without significant HF symptoms from the vegetation.  Patient left [facility] multiple times in the interim.  He now presented to [facility] yesterday plaints of chest pain.  He was also found to be hyperkalemic was treated for this.  He was  also to have emergent dialysis but he then left AGAINST MEDICAL ADVICE--he claims he was "kicked out"  He then went to [facility] department.    Been admitted to [facility] at [facility].  Seen by psychiatry who feel that he is competent.  Note [facility] psychiatry had seen him as well and felt he was competent though his behaviors seem not very consistent with this in my opinion.  In any case he and I saw him at the bedside with Dr. Vida Roller from nephrology.  He has gotten a dose of vancomycin and getting 1 of ceftriaxone blood cultures are negative at [facility].  Of note he has had a perirectal lesion that appears to have been an abscess that required I&D in [facility] which has been draining he says for months.  Certainly this could be the source of his bacteremia and now hemodialysis catheter infection and aortic valve endocarditis.  The Karius test at [facility] is something I cannot see on my and will need to check with [facility] regarding results from this test.  He is on TIVICAY any Edurant hich have been filled he says at [facility] in [place] where he lives near [place] but he has NOT been under care of an ID provider here or [employer].  His most recent viral load was less than 50 at [facility].  If he is on these 2 regimens he should certainly be on the coformulated pill of JULUCA.  I am not a huge fan of rilpivirine as it is not a very full proof drug as it requires individuals to avoid proton pump inhibitors H2 blockers and to eat a meal with their antiviral to ensure that the recovery is absorbed through a more acidic stomach.  He understands that his dialysis catheter will be removed most likely tomorrow after he undergoes dialysis tonight.  He will then have a "catheter holiday for 2 days prior to insertion of a new hemodialysis catheter.  Repeat blood cultures done here in our healthcare system are also showing no growth so far.  We can certainly treat  him with vancomycin and ceftriaxone while in the inpatient though ceftriaxone will not be an optimal drug for dialysis.  Would suggest switching to cefepime with HD along with vancomycin being continued.  I have personally spent 112 minutes involved in face-to-face and non-face-to-face activities for this patient on the day of the visit. Professional time spent includes the following activities: Preparing to see the patient (review of tests), Obtaining and/or reviewing separately obtained history (admission/discharge record), Performing a medically appropriate examination and/or evaluation , Ordering medications/tests/procedures, referring and communicating with other health care professionals, Documenting clinical information in the EMR, Independently interpreting results (not separately reported), Communicating results to the patient/family/caregiver, Counseling and educating the patient/family/caregiver and Care coordination (not separately reported).     Review of Systems: Review of Systems  Constitutional:  Negative for chills, fever, malaise/fatigue and weight loss.  HENT:  Negative for congestion and sore  throat.   Eyes:  Negative for blurred vision and photophobia.  Respiratory:  Negative for cough, shortness of breath and wheezing.   Cardiovascular:  Positive for chest pain. Negative for palpitations and leg swelling.  Gastrointestinal:  Negative for abdominal pain, blood in stool, constipation, diarrhea, heartburn, melena, nausea and vomiting.  Genitourinary:  Negative for dysuria, flank pain and hematuria.  Musculoskeletal:  Negative for back pain, falls, joint pain and myalgias.  Skin:  Negative for itching and rash.  Neurological:  Negative for dizziness, focal weakness, loss of consciousness, weakness and headaches.  Endo/Heme/Allergies:  Does not bruise/bleed easily.  Psychiatric/Behavioral:  Negative for depression and suicidal ideas. The patient does not have insomnia.     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   Candidiasis of mouth    Cardiomyopathy    Chest pain, unspecified    Diseases of lips    Angular cheilitis   HIV infection    Hypertension    Lymphoma    Personal history of unspecified disease of respiratory system    Pneumocystosis    Shingles    Tobacco use     Social History   Tobacco Use   Smoking status: Former    Packs/day: 1.00    Years: 35.00    Additional pack years: 0.00    Total pack years: 35.00    Types: Cigarettes    Quit date: 11/23/2015    Years since quitting: 7.2   Smokeless tobacco: Never   Tobacco comments:    would like help quitting  Substance Use Topics   Alcohol use: Yes    Alcohol/week: 0.0 standard drinks of alcohol    Comment: beer weekends   Drug use: No    Family History  Problem Relation Age of Onset   Heart disease Mother    Diabetes Mother    Cancer Mother    Diabetes Sister    CAD Sister        ? stents   Diabetes Maternal Grandmother    Hypertension Brother    Allergies  Allergen Reactions   Penicillins Anaphylaxis    Has patient had a PCN reaction causing immediate rash, facial/tongue/throat swelling, SOB or lightheadedness with hypotension: Unknown Has patient had a PCN reaction causing severe rash involving mucus membranes or skin necrosis: Unknown Has patient had a PCN reaction that required hospitalization: Unknown Has patient had a PCN reaction occurring within the last 10 years: No If all of the above answers are "NO", then may proceed with Cephalosporin use.    OBJECTIVE: Blood pressure (!) 100/47, pulse 64, temperature 97.7 F (36.5 C), temperature source Oral, resp. rate 12, height  (1.651 m), weight 63.5 kg, SpO2 90 %.  Physical Exam Constitutional:      Appearance: He is well-developed.  HENT:     Head: Normocephalic and atraumatic.  Eyes:     Conjunctiva/sclera: Conjunctivae normal.  Cardiovascular:      Rate and Rhythm: Normal rate and regular rhythm.  Pulmonary:     Effort: Pulmonary effort is normal. No respiratory distress.     Breath sounds: No wheezing.  Abdominal:     General: There is no distension.     Palpations: Abdomen is soft.  Genitourinary:    Comments: Perirectal boil Musculoskeletal:        General: No tenderness. Normal range of motion.     Cervical back: Normal range of motion  and neck supple.  Skin:    General: Skin is warm and dry.     Coloration: Skin is not pale.     Findings: No erythema or rash.  Neurological:     General: No focal deficit present.     Mental Status: He is alert and oriented to person, place, and time.  Psychiatric:        Mood and Affect: Mood normal.        Speech: Speech normal.        Behavior: Behavior normal.        Thought Content: Thought content normal.        Cognition and Memory: Cognition normal.     Lab Results Lab Results  Component Value Date   WBC 6.5 03/03/2023   HGB 12.0 (L) 03/03/2023   HCT 40.6 03/03/2023   MCV 91.4 03/03/2023   PLT 231 03/03/2023    Lab Results  Component Value Date   CREATININE 6.91 (H) 03/03/2023   BUN 42 (H) 03/03/2023   NA 133 (L) 03/03/2023   K 5.0 03/03/2023   CL 93 (L) 03/03/2023   CO2 28 03/03/2023    Lab Results  Component Value Date   ALT 24 03/03/2023   AST 30 03/03/2023   ALKPHOS 76 03/03/2023   BILITOT 0.5 03/03/2023     Microbiology: Recent Results (from the past 240 hour(s))  Blood culture (routine x 2)     Status: None (Preliminary result)   Collection Time: 03/01/23  8:00 PM   Specimen: BLOOD  Result Value Ref Range Status   Specimen Description BLOOD LEFT ANTECUBITAL  Final   Special Requests   Final    BOTTLES DRAWN AEROBIC AND ANAEROBIC Blood Culture adequate volume   Culture   Final    NO GROWTH 2 DAYS Performed at [facility], [address], [place], [place] [address]    Report Status PENDING  Incomplete  Blood culture (routine x 2)      Status: None (Preliminary result)   Collection Time: 03/01/23  8:20 PM   Specimen: BLOOD RIGHT HAND  Result Value Ref Range Status   Specimen Description BLOOD RIGHT HAND  Final   Special Requests   Final    BOTTLES DRAWN AEROBIC AND ANAEROBIC Blood Culture adequate volume   Culture   Final    NO GROWTH 2 DAYS Performed at [facility], [address], [place], [place] [address]    Report Status PENDING  Incomplete    Acey Lav, MD [facility] [phone] pager  03/03/2023, 12:03 PM

## 2023-03-03 NOTE — Consult Note (Signed)
Renal Service Consult Note [facility]  [name] 03/03/2023 [name] Krabbe, MD Requesting Physician: Dr. Ella Baldwin  Reason for Consult: ESRD pt w/ AoV endocarditis and vegetation on [facility] tip HPI: The patient is a 62 y.o. year-old w/ PMH as below who was admitted to [facility] in early April and dx'd w/ AoV endocarditis and vegetation on TDC tip. Bcx's were reportedly negative. Pt has ESRD on HD in [facility] area TTS. He presented to [facility] on 4/14 pm and had overnight HD here for high K+. Post HD he left AMA (although he says he was "kicked out"). He ended up in [facility] ED and was readmitted to medical team. Needs TDC removed, ID is consulting also. We are asked to see for ESRD.    Pt seen in room, on HD 3 yrs approx. Had hip surgery here in [place] yrs ago and likes the [facility], does not have family here. Denies any SOB, cough or CP. No pain at [facility] site.   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   Candidiasis of mouth    Cardiomyopathy    Chest pain, unspecified    Diseases of lips    Angular cheilitis   HIV infection    Hypertension    Lymphoma    Personal history of unspecified disease of respiratory system    Pneumocystosis    Shingles    Tobacco use    Past Surgical History  Past Surgical History:  Procedure Laterality Date   BASCILIC VEIN TRANSPOSITION Left 05/24/2018   Procedure: FIRST STAGE BASCILIC VEIN TRANSPOSITION;  Surgeon: Larina Earthly, MD;  Location: [facility] OR;  Service: Vascular;  Laterality: Left;   IR FLUORO GUIDE CV LINE RIGHT  05/17/2018   IR US GUIDE VASC ACCESS RIGHT  05/17/2018   Family History  Family History  Problem Relation Age of Onset   Heart disease Mother    Diabetes Mother    Cancer Mother    Diabetes Sister    CAD  Sister        ? stents   Diabetes Maternal Grandmother    Hypertension Brother    Social History  reports that he quit smoking about 7 years ago. He has a 35.00 pack-year smoking history. He has never used smokeless tobacco. He reports current alcohol use. He reports that he does not use drugs. Allergies  Allergies  Allergen Reactions   Penicillins Anaphylaxis    Has patient had a PCN reaction causing immediate rash, facial/tongue/throat swelling, SOB or lightheadedness with hypotension: Unknown Has patient had a PCN reaction causing severe rash involving mucus membranes or skin necrosis: Unknown Has patient had a PCN reaction that required hospitalization: Unknown Has patient had a PCN reaction occurring within the last 10 years: No If all of the above answers are "NO", then may proceed with Cephalosporin use.   Home medications Prior to Admission medications   Medication Sig Start Date End Date Taking? Authorizing Provider  amiodarone (PACERONE) 200 MG tablet Take 200 mg by mouth daily.    [provider]  amitriptyline (ELAVIL) 10 MG tablet Take 10 mg by mouth at bedtime. 12/04/21   [provider]  aspirin 81 MG chewable tablet Chew 1 tablet (81 mg  total) by mouth daily. With Food 05/27/18   Shon Hale, MD  atorvastatin (LIPITOR) 80 MG tablet Take 80 mg by mouth daily. 01/04/23   [provider]  carvedilol (COREG) 12.5 MG tablet Take 1 tablet (12.5 mg total) by mouth 2 (two) times daily with a meal. Patient taking differently: Take 25 mg by mouth 2 (two) times daily with a meal. 05/26/18   Emokpae, Courage, MD  clopidogrel (PLAVIX) 75 MG tablet Take 75 mg by mouth daily. 10/03/22   [provider]  D3-50 1.25 MG (50000 UT) capsule Take 50,000 Units by mouth once a week. 01/27/23   [provider]  dapsone 100 MG tablet Take 1 tablet (100 mg total) by mouth daily. 05/26/18   Shon Hale, MD  dolutegravir (TIVICAY) 50 MG tablet Take 1  tablet (50 mg total) by mouth daily. 05/26/18   Emokpae, Courage, MD  ELIQUIS 5 MG TABS tablet Take 5 mg by mouth 2 (two) times daily. 05/17/22   [provider]  famotidine (PEPCID) 40 MG tablet Take 40 mg by mouth daily. 01/04/23   [provider]  hydrALAZINE (APRESOLINE) 100 MG tablet Take 100 mg by mouth 3 (three) times daily.    [provider]  isosorbide mononitrate (IMDUR) 60 MG 24 hr tablet Take 60 mg by mouth daily. 01/04/23   [provider]  lisinopril (ZESTRIL) 10 MG tablet Take 10 mg by mouth daily. 02/16/23   [provider]  methocarbamol (ROBAXIN) 500 MG tablet Take 500 mg by mouth 4 (four) times daily.    [provider]  minoxidil (LONITEN) 10 MG tablet Take 10 mg by mouth 2 (two) times daily. 08/16/21   [provider]  mirtazapine (REMERON) 7.5 MG tablet Take 7.5 mg by mouth at bedtime. 01/04/23   [provider]  nitroGLYCERIN (NITROSTAT) 0.4 MG SL tablet Place 0.4 mg under the tongue every 5 (five) minutes as needed for chest pain. 04/27/19   [provider]  Oxycodone HCl 10 MG TABS Take 10 mg by mouth every 8 (eight) hours as needed (for pain). 10/02/22   [provider]  rilpivirine (EDURANT) 25 MG TABS tablet Take 1 tablet (25 mg total) by mouth daily with breakfast. 05/27/18   Shon Hale, MD  sevelamer carbonate (RENVELA) 800 MG tablet Take 1,600-2,400 mg by mouth See admin instructions. Take 3 tablets by mouth three times daily with meals and 2 tablets twice daily with snacks 12/04/21   [provider]     Vitals:   03/03/23 0900 03/03/23 1015 03/03/23 1024 03/03/23 1025  BP: (!) 154/88 (!) 100/47    Pulse:  66 64   Resp: Temp:      TempSrc:      SpO2:  (!) 86% 90%   Weight:    63.5 kg  Height:     (1.651 m)   Exam Gen alert, no distress, 2L [place]  No rash, cyanosis or gangrene Sclera anicteric, throat clear  No jvd or bruits Chest clear bilat to  bases, no rales/ wheezing RRR no MRG Abd soft ntnd no mass or ascites +bs GU defer MS no joint effusions or deformity Ext no LE or UE edema, no wounds or ulcers Neuro is alert, Ox 3 , nf    RIJ TDC intact, clean exit      Home meds include - renvela 2-3 ac tid, oxy IR, remeron, minoxidil 10 bid, lisinopril 10, imdur 60, hydralazine 100  tid, pepcid, eliquis, plavix, coreg 25 bid, lipitor, asa, elavil, edurant, tivicay, prezista, dapsone, amiodarone, prns     OP HD: TTS [place]/ [place]    4h  420/ 800   68kg   2/2/ bath  Heparin ?  RIJ TDC     Assessment/ Plan: AoV endocarditis/ +vegetation on tip of TDC  - blood cxs negative from early April at [facility] per CE.  We are asked to provide dialysis and arrange for a line holiday as well. Plan HD today and will consult IR for [facility] removal / replacement thereafter.  ESRD - on HD TTS as above. HD today.  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 Get records.  HIV - per ID       [name] Moselle  MD [facility] 03/03/2023, 11:30 AM  Recent Labs  Lab 03/02/23 0415 03/03/23 0409  HGB 11.6* 12.0*  ALBUMIN 3.1* 3.4*  CALCIUM 8.4* 8.6*  PHOS 5.4*  --   CREATININE 8.39* 6.91*  K 5.5* 5.0   Inpatient medications:  amitriptyline  10 mg Oral QHS   apixaban  5 mg Oral BID   aspirin  81 mg Oral Daily   atorvastatin  80 mg Oral Daily   carvedilol  25 mg Oral BID WC   Chlorhexidine Gluconate Cloth  6 each Topical Daily   clopidogrel  75 mg Oral Daily   dapsone  100 mg Oral Daily   dolutegravir-rilpivirine  1 tablet Oral Q lunch   hydrALAZINE  100 mg Oral TID   isosorbide mononitrate  60 mg Oral Daily   lisinopril  10 mg Oral Daily   minoxidil  10 mg Oral BID   mirtazapine  7.5 mg Oral QHS   sevelamer carbonate  2,400 mg Oral TID WC   vancomycin variable dose per unstable renal  function (pharmacist dosing)   Does not apply See admin instructions    cefTRIAXone (ROCEPHIN)  IV 2 g (03/03/23 0931)   vancomycin     acetaminophen **OR** acetaminophen, albuterol, HYDROmorphone (DILAUDID) injection, ondansetron **OR** ondansetron (ZOFRAN) IV, oxyCODONE, senna-docusate, sevelamer carbonate, traMADol, traZODone

## 2023-03-03 NOTE — ED Notes (Signed)
Pt placed on 2 L/M nasal cannula due to resting SpO2 reading lower 80's with good waveform. Pt now reading 96%.

## 2023-03-03 NOTE — ED Notes (Signed)
Pt called out, stating "I think I'm having a heart attack", review of cardiac monitor, no change in cardiac monitor. Asked pt to describe his chest pain, pt reports "it's a 10 or more, I need pain medication and to see the doctor, it is not better". Advised will alert the provider, reassured pt that EKG rhythm on cardiac monitor shows no changes, VSS, and will check for his PRN medication for what may be given for pain, pt st "I know my schedule, I can have the oxy now, it's every 8 hours and it's time for the dilaudid too, that's every few hours". Refer to [facility] for medication administer, floor coverage to be notified of pt's concern as he is still waiting transfer to [facility] once a bed has been assigned.

## 2023-03-03 NOTE — Progress Notes (Signed)
Pharmacy Antibiotic Note  [name] is a 62 y.o. male admitted on 03/02/2023 with culture-negative  endocarditis .  Pharmacy has been consulted for vancomycin dosing. Pt is ESRD on HD TTS PTA. Pt was recently seen at [facility], TEE on 4/5 with aortic valve vegetation. Blood cultures from 4/4, 4/6, 4/7 with no growth final. Pt was followed by ID at [facility] who started empiric treatment with vancomycin and ceftriaxone. Bartonella Henselae IgG and IgM negative, Bartonella Quintana IgG negative, and Karius test in process. Pt did receive HD at [facility] on 4/15 and tolerated full session, but vancomycin was not administered as patient left AMA. Vancomycin random level on 4/14 prior to HD was 16, post-HD dose indicated.   Plan: Vancomycin 750 mg IV x 1 Follow up HD plans when patient transfers  Follow up Karius test and blood cultures  Height:  (165.1 cm) Weight: 63.5 kg (140 lb) IBW/kg (Calculated) : 61.5  Temp (24hrs), Avg:97.9 F (36.6 C), Min:97.6 F (36.4 C), Max:98.2 F (36.8 C)  Recent Labs  Lab 03/01/23 1730 03/01/23 2000 03/01/23 2250 03/02/23 0415 03/03/23 0409  WBC 6.7  --   --  7.2 6.5  CREATININE 8.46* 8.75*  8.76* 8.46* 8.39* 6.91*  VANCORANDOM  --  16  --   --   --     Estimated Creatinine Clearance: 9.8 mL/min (A) (by C-G formula based on SCr of 6.91 mg/dL (H)).    Allergies  Allergen Reactions   Penicillins Anaphylaxis    Has patient had a PCN reaction causing immediate rash, facial/tongue/throat swelling, SOB or lightheadedness with hypotension: Unknown Has patient had a PCN reaction causing severe rash involving mucus membranes or skin necrosis: Unknown Has patient had a PCN reaction that required hospitalization: Unknown Has patient had a PCN reaction occurring within the last 10 years: No If all of the above answers are "NO", then may proceed with Cephalosporin use.    Antimicrobials this admission: 4/16 vancomycin >>  4/16 ceftriaxone >>   Microbiology  results: 4/14 BCx: ngtd  Thank you for allowing pharmacy to be a part of this patient's care.  Larena Sox, PharmD PGY1 Pharmacy Resident   03/03/2023  9:32 AM

## 2023-03-03 NOTE — Progress Notes (Signed)
20 gauge R forearm IV that was placed today had infiltrated. Estimated 75% of IV vancomycin that was given around 5 pm had infiltrated into surrounding tissue. IV was removed. Pt. Denies any pain around the affected site, no redness, arm elevated to help with swelling. Pharmacy was notified. IV team consulted.  Pt. refused blood collection for vancomycin level. Pharmacy made aware.

## 2023-03-03 NOTE — Assessment & Plan Note (Addendum)
Aortic root abscess.  HD catheter tip infection.   Plan to continue antibiotic therapy with IV vancomycin and cefepime.  Potential antibiotic duration 6 weeks.  Patient not candidate for valve surgery.  HD catheter will be removed for catheter holiday.  Follow up with wound care recommendations for perirectal wounds.

## 2023-03-03 NOTE — Progress Notes (Signed)
Attempted to called [facility], after being left a message to call about Mr. Chavous.  Attempted to call three times.  Got a recording each time, with no options to choose from.  Each time the recording hung up.

## 2023-03-03 NOTE — Assessment & Plan Note (Addendum)
No clinical signs of decompensation. Continue blood pressure monitoring.  On carvedilol, hydralazine and lisinopril.

## 2023-03-03 NOTE — Procedures (Signed)
   I was present at this dialysis session, have reviewed the session itself and made  appropriate changes Vinson Moselle MD [facility] pager [phone]   03/03/2023, 2:04 PM

## 2023-03-03 NOTE — Progress Notes (Addendum)
12:31- Transport services arrived to unit to pick up patient for HD. Patient adamant on not wanting to go until he receives his lunch tray. Spoke with Candace, HD RN and she stated that she will wait up until 1300 to hold patient spot. Advised patient and he verbalized agreement.   12:53- Transport arrived back on unit to take patient over to HD. Patient still adamant on not wanting to go because he hasn't received his tray. Educated patient that it would be in his best interest to go. However we can't "force" him to go. Patient agreeable for HD at this time.

## 2023-03-04 ENCOUNTER — Inpatient Hospital Stay (HOSPITAL_COMMUNITY): Payer: 59

## 2023-03-04 ENCOUNTER — Encounter (HOSPITAL_COMMUNITY): Payer: Self-pay | Admitting: Internal Medicine

## 2023-03-04 DIAGNOSIS — T827XXD Infection and inflammatory reaction due to other cardiac and vascular devices, implants and grafts, subsequent encounter: Secondary | ICD-10-CM

## 2023-03-04 DIAGNOSIS — N186 End stage renal disease: Secondary | ICD-10-CM | POA: Diagnosis not present

## 2023-03-04 DIAGNOSIS — Z992 Dependence on renal dialysis: Secondary | ICD-10-CM | POA: Diagnosis not present

## 2023-03-04 DIAGNOSIS — I330 Acute and subacute infective endocarditis: Secondary | ICD-10-CM | POA: Diagnosis not present

## 2023-03-04 DIAGNOSIS — I120 Hypertensive chronic kidney disease with stage 5 chronic kidney disease or end stage renal disease: Secondary | ICD-10-CM | POA: Diagnosis not present

## 2023-03-04 DIAGNOSIS — B20 Human immunodeficiency virus [HIV] disease: Secondary | ICD-10-CM | POA: Diagnosis not present

## 2023-03-04 DIAGNOSIS — I5021 Acute systolic (congestive) heart failure: Secondary | ICD-10-CM | POA: Diagnosis not present

## 2023-03-04 DIAGNOSIS — I480 Paroxysmal atrial fibrillation: Secondary | ICD-10-CM | POA: Diagnosis not present

## 2023-03-04 DIAGNOSIS — I214 Non-ST elevation (NSTEMI) myocardial infarction: Secondary | ICD-10-CM | POA: Diagnosis not present

## 2023-03-04 DIAGNOSIS — I358 Other nonrheumatic aortic valve disorders: Secondary | ICD-10-CM | POA: Diagnosis not present

## 2023-03-04 HISTORY — PX: IR REMOVAL TUN CV CATH W/O FL: IMG2289

## 2023-03-04 LAB — BASIC METABOLIC PANEL
Anion gap: 17 — ABNORMAL HIGH (ref 5–15)
BUN: 47 mg/dL — ABNORMAL HIGH (ref 8–23)
CO2: 26 mmol/L (ref 22–32)
Calcium: 8.6 mg/dL — ABNORMAL LOW (ref 8.9–10.3)
Chloride: 94 mmol/L — ABNORMAL LOW (ref 98–111)
Creatinine, Ser: 7.6 mg/dL — ABNORMAL HIGH (ref 0.61–1.24)
GFR, Estimated: 8 mL/min — ABNORMAL LOW (ref >60)
Glucose, Bld: 102 mg/dL — ABNORMAL HIGH (ref 70–99)
Potassium: 4.8 mmol/L (ref 3.5–5.1)
Sodium: 137 mmol/L (ref 135–145)

## 2023-03-04 LAB — CBC
HCT: 39.6 % (ref 39.0–52.0)
Hemoglobin: 12.3 g/dL — ABNORMAL LOW (ref 13.0–17.0)
MCH: 27.6 pg (ref 26.0–34.0)
MCHC: 31.1 g/dL (ref 30.0–36.0)
MCV: 89 fL (ref 80.0–100.0)
Platelets: 239 10*3/uL (ref 150–400)
RBC: 4.45 MIL/uL (ref 4.22–5.81)
RDW: 18.6 % — ABNORMAL HIGH (ref 11.5–15.5)
WBC: 6.9 10*3/uL (ref 4.0–10.5)
nRBC: 0 % (ref 0.0–0.2)

## 2023-03-04 LAB — CULTURE, BLOOD (ROUTINE X 2)

## 2023-03-04 LAB — VANCOMYCIN, RANDOM: Vancomycin Rm: 39 ug/mL

## 2023-03-04 LAB — TROPONIN I (HIGH SENSITIVITY): Troponin I (High Sensitivity): 89 ng/L — ABNORMAL HIGH (ref <18)

## 2023-03-04 MED ORDER — LIDOCAINE HCL 1 % IJ SOLN
INTRAMUSCULAR | Status: AC
  Filled 2023-03-04: qty 20

## 2023-03-04 MED ORDER — NITROGLYCERIN 0.4 MG SL SUBL
0.4000 mg | SUBLINGUAL_TABLET | SUBLINGUAL | Status: DC | PRN
  Administered 2023-03-04 – 2023-03-08 (×12): 0.4 mg via SUBLINGUAL
  Filled 2023-03-04 (×9): qty 1

## 2023-03-04 MED ORDER — NITROGLYCERIN 0.4 MG SL SUBL
SUBLINGUAL_TABLET | SUBLINGUAL | Status: AC
  Filled 2023-03-04: qty 2

## 2023-03-04 MED ORDER — SODIUM ZIRCONIUM CYCLOSILICATE 10 G PO PACK
10.0000 g | PACK | Freq: Every day | ORAL | Status: AC
  Administered 2023-03-04 – 2023-03-05 (×2): 10 g via ORAL
  Filled 2023-03-04 (×2): qty 1

## 2023-03-04 MED ORDER — MUPIROCIN 2 % EX OINT
TOPICAL_OINTMENT | Freq: Two times a day (BID) | CUTANEOUS | Status: DC
  Administered 2023-03-12: 1 via TOPICAL
  Filled 2023-03-04 (×3): qty 22

## 2023-03-04 MED ORDER — NITROGLYCERIN 0.4 MG SL SUBL
SUBLINGUAL_TABLET | SUBLINGUAL | Status: AC
  Filled 2023-03-04: qty 1

## 2023-03-04 MED ORDER — VANCOMYCIN HCL IN DEXTROSE 1-5 GM/200ML-% IV SOLN: 1000.0000 mg | INTRAVENOUS | Status: DC

## 2023-03-04 MED ORDER — SODIUM CHLORIDE 0.9 % IV SOLN
2.0000 g | Freq: Once | INTRAVENOUS | Status: AC
  Administered 2023-03-04: 2 g via INTRAVENOUS
  Filled 2023-03-04: qty 12.5

## 2023-03-04 NOTE — Progress Notes (Signed)
Attempting to reach pt's out-pt clinic to confirm pt's out-pt schedule and times. Awaiting response from clinic.   Olivia Canter Renal Navigator [phone]

## 2023-03-04 NOTE — Progress Notes (Signed)
Pt's Rt forearm still has a swelling.Denies any pain,tingling or numbness.No redness ,warm and +2 pulse present.Arm elevated and cold compress applied.Pt refused morning labs and PIV placement,Dr.Rathore made aware. Marland Kitchen

## 2023-03-04 NOTE — Progress Notes (Signed)
Subjective: Is complaining of chest pain saying that he feels like the whole hospital is sitting on his chest   Antibiotics:  Anti-infectives (From admission, onward)    Start     Dose/Rate Route Frequency Ordered Stop   03/04/23 1200  vancomycin (VANCOCIN) IVPB 1000 mg/200 mL premix  Status:  Discontinued        1,000 mg 200 mL/hr over 60 Minutes Intravenous On call 03/04/23 1110 03/04/23 1114   03/04/23 1000  ceFEPIme (MAXIPIME) 2 g in sodium chloride 0.9 % 100 mL IVPB        2 g 200 mL/hr over 30 Minutes Intravenous  Once 03/04/23 0904 03/04/23 1106   03/03/23 2215  linezolid (ZYVOX) tablet 600 mg        600 mg Oral  Once 03/03/23 2125 03/03/23 2233   03/03/23 1700  vancomycin (VANCOREADY) IVPB 1250 mg/250 mL        1,250 mg 166.7 mL/hr over 90 Minutes Intravenous  Once 03/03/23 1506 03/03/23 1911   03/03/23 1600  vancomycin (VANCOREADY) IVPB 750 mg/150 mL  Status:  Discontinued        750 mg 150 mL/hr over 60 Minutes Intravenous  Once 03/03/23 1447 03/03/23 1506   03/03/23 1230  vancomycin (VANCOREADY) IVPB 750 mg/150 mL        750 mg 150 mL/hr over 60 Minutes Intravenous  Once 03/03/23 1133 03/03/23 1315   03/03/23 1200  dolutegravir-rilpivirine (JULUCA) 50-25 MG per tablet 1 tablet        1 tablet Oral Daily with lunch 03/03/23 1113     03/03/23 1200  dapsone tablet 100 mg        100 mg Oral Daily 03/03/23 1113     03/03/23 0930  vancomycin (VANCOREADY) IVPB 750 mg/150 mL  Status:  Discontinued        750 mg 150 mL/hr over 60 Minutes Intravenous  Once 03/03/23 0922 03/03/23 1133   03/03/23 0921  vancomycin variable dose per unstable renal function (pharmacist dosing)         Does not apply See admin instructions 03/03/23 0922     03/03/23 0915  cefTRIAXone (ROCEPHIN) 2 g in sodium chloride 0.9 % 100 mL IVPB  Status:  Discontinued        2 g 200 mL/hr over 30 Minutes Intravenous Every 24 hours 03/03/23 0909 03/04/23 4098       Medications: Scheduled  Meds:  amiodarone  200 mg Oral Daily   amitriptyline  10 mg Oral QHS   apixaban  5 mg Oral BID   aspirin  81 mg Oral Daily   atorvastatin  80 mg Oral Daily   carvedilol  25 mg Oral BID WC   Chlorhexidine Gluconate Cloth  6 each Topical Daily   Chlorhexidine Gluconate Cloth  6 each Topical Q0600   clopidogrel  75 mg Oral Daily   dapsone  100 mg Oral Daily   dolutegravir-rilpivirine  1 tablet Oral Q lunch   hydrALAZINE  100 mg Oral TID   isosorbide mononitrate  60 mg Oral Daily   lisinopril  10 mg Oral Daily   minoxidil  10 mg Oral BID   mirtazapine  7.5 mg Oral QHS   mupirocin ointment   Topical BID   nitroGLYCERIN       nitroGLYCERIN       sevelamer carbonate  2,400 mg Oral TID WC   vancomycin variable dose per unstable renal function (pharmacist dosing)  Does not apply See admin instructions   Continuous Infusions: PRN Meds:.acetaminophen **OR** acetaminophen, albuterol, HYDROmorphone (DILAUDID) injection, nitroGLYCERIN, nitroGLYCERIN, nitroGLYCERIN, ondansetron **OR** ondansetron (ZOFRAN) IV, oxyCODONE, senna-docusate, sevelamer carbonate, traMADol, traZODone    Objective: Weight change: 0 kg  Intake/Output Summary (Last 24 hours) at 03/04/2023 1233 Last data filed at 03/03/2023 2242 Gross per 24 hour  Intake 817.81 ml  Output 2000 ml  Net -1182.19 ml   Blood pressure (!) 135/50, pulse 75, temperature 98 F (36.7 C), resp. rate (!) 21, height  (1.651 m), weight 62.9 kg, SpO2 98 %. Temp:  [97.9 F (36.6 C)-98.7 F (37.1 C)] 98 F (36.7 C) (04/17 1018) Pulse Rate:  [52-88] 75 (04/17 1021) Resp:  [9-23] 21 (04/17 1021) BP: (67-168)/(38-80) 135/50 (04/17 1021) SpO2:  [91 %-100 %] 98 % (04/17 1021) Weight:  [62.9 kg-64.9 kg] 62.9 kg (04/16 1640)  Physical Exam: Physical Exam Vitals reviewed.  Constitutional:      Appearance: He is well-developed. He is obese.  HENT:     Head: Normocephalic and atraumatic.  Cardiovascular:     Rate and Rhythm: Normal rate.   Pulmonary:     Effort: No respiratory distress.     Breath sounds: No wheezing.  Chest:     Chest wall: Tenderness present.  Neurological:     General: No focal deficit present.     Mental Status: He is alert and oriented to person, place, and time.  Psychiatric:        Attention and Perception: Attention normal.        Mood and Affect: Mood is anxious and depressed.        Speech: Speech normal.        Behavior: Behavior normal.        Thought Content: Thought content normal.        Cognition and Memory: Cognition and memory normal.        Judgment: Judgment normal.      CBC:    BMET Recent Labs    03/03/23 0409 03/04/23 0910  NA 133* 137  K 5.0 4.8  CL 93* 94*  CO2 28 26  GLUCOSE 111* 102*  BUN 42* 47*  CREATININE 6.91* 7.60*  CALCIUM 8.6* 8.6*     Liver Panel  Recent Labs    03/01/23 2000 03/02/23 0415 03/03/23 0409  PROT 8.3*  --  8.2*  ALBUMIN 3.4* 3.1* 3.4*  AST 46*  --  30  ALT 25  --  24  ALKPHOS 83  --  76  BILITOT 0.9  --  0.5  BILIDIR 0.4*  --   --   IBILI 0.5  --   --        Sedimentation Rate No results for input(s): "ESRSEDRATE" in the last 72 hours. C-Reactive Protein No results for input(s): "CRP" in the last 72 hours.  Micro Results: Recent Results (from the past 720 hour(s))  Blood culture (routine x 2)     Status: None (Preliminary result)   Collection Time: 03/01/23  8:00 PM   Specimen: BLOOD  Result Value Ref Range Status   Specimen Description BLOOD LEFT ANTECUBITAL  Final   Special Requests   Final    BOTTLES DRAWN AEROBIC AND ANAEROBIC Blood Culture adequate volume   Culture   Final    NO GROWTH 3 DAYS Performed at [facility], [address], [place], [place] [address]    Report Status PENDING  Incomplete  Blood culture (routine x 2)  Status: None (Preliminary result)   Collection Time: 03/01/23  8:20 PM   Specimen: BLOOD RIGHT HAND  Result Value Ref Range Status   Specimen Description BLOOD RIGHT  HAND  Final   Special Requests   Final    BOTTLES DRAWN AEROBIC AND ANAEROBIC Blood Culture adequate volume   Culture   Final    NO GROWTH 3 DAYS Performed at [facility], [address], [place], [place] [address]    Report Status PENDING  Incomplete    Studies/Results: IR Removal Tun Cv Cath W/O FL  Result Date: 03/04/2023 INDICATION: Patient with bacterial endocarditis, dialysis dependent via right IJ tunnel dialysis catheter placed at outside hospital. Request is made for removal for line holiday. EXAM: REMOVAL TUNNELED CENTRAL VENOUS CATHETER MEDICATIONS: 5 mL 1% lidocaine ANESTHESIA/SEDATION: None FLUOROSCOPY TIME:  None COMPLICATIONS: None immediate. PROCEDURE: Informed written consent was obtained from the patient after a thorough discussion of the procedural risks, benefits and alternatives. All questions were addressed. Maximal Sterile Barrier Technique was utilized including caps, mask, sterile gowns, sterile gloves, sterile drape, hand hygiene and skin antiseptic. A timeout was performed prior to the initiation of the procedure. The patient's right chest and catheter was prepped and draped in a normal sterile fashion. Heparin was removed from both ports of catheter. 1% lidocaine was used for local anesthesia. Using gentle blunt dissection the cuff of the catheter was exposed and the catheter was removed in it's entirety. Pressure was held till hemostasis was obtained. A sterile dressing was applied. The patient tolerated the procedure well with no immediate complications. IMPRESSION: Successful catheter removal as described above. Read by: Loyce Dys PA-C Electronically Signed   By: Simonne Come M.D.   On: 03/04/2023 11:10      Assessment/Plan:  INTERVAL HISTORY: HD catheter has been removed   Principal Problem:   Aortic valve endocarditis Active Problems:   Human immunodeficiency virus (HIV) disease (HCC)   Hypertension   CHF (congestive heart failure)   ESRD on  hemodialysis   Paroxysmal atrial fibrillation   Depression   Coronary artery disease    [name] is a 62 y.o. male with multiple medical problems including HIV/AIDS with intermittent adherence most recently virally suppressed with viral load less than 50 on TIVICAY and Edurant-->Juluca here, who was admitted to [facility] for workup of chest pain and found to have aortic and possible mitral valve vegetations on 2D echocardiogram.  Also been concern about an aortic root abscess.  Transfer to [facility] he underwent transesophageal echocardiogram that showed aortic valve endocarditis with moderate AR but no aortic root abscess.  Blood cultures prior to antibiotics in the fourth sixth and seventh did not grow any organisms FINAL  Patient was started on vancomycin and ceftriaxone.  Karius test was also performed and is pending  He has now been admitted to [facility]  HD catheter removed  IF possible will obtain another set of blood cultures post line removal today  He will have a line "holiday" of 2 days and then new HD catheter  Switch him  over to cefepime with vancomycin for easier dosing around hemodialysis.  I will endeavor to find out data re Karius  Poor IV access; hopefull will not need a central line  ESRD On HD  HIV/AIDS: Continue JULUCA with food and avoidance of H2 blockers and PPIs and continue dapsone  Chest pain: defer to primary team and d/w Dr. Seth Bake  I have personally  spent 52 minutes involved in face-to-face and non-face-to-face activities for this patient on the day of the visit. Professional time spent includes the following activities: Preparing to see the patient (review of tests), Obtaining and/or reviewing separately obtained history (admission/discharge record), Performing a medically appropriate examination and/or evaluation , Ordering medications/tests/procedures, referring and communicating with other health care  professionals, Documenting clinical information in the EMR, Independently interpreting results (not separately reported), Communicating results to the patient/family/caregiver, Counseling and educating the patient/family/caregiver and Care coordination (not separately reported).     LOS: 2 days   [name] 03/04/2023, 12:33 PM

## 2023-03-04 NOTE — Progress Notes (Signed)
  Transition of Care [facility]) Screening Note   Patient Details  Name: [name] Date of Birth: 08/12/1961   Transition of Care [facility]) CM/SW Contact:    Mearl Latin, LCSW Phone Number: 03/04/2023, 8:51 AM    Transition of Care Department [facility]) has reviewed patient from [place]. OP HD is TTS at [facility]. We will continue to monitor patient advancement through interdisciplinary progression rounds. If new patient transition needs arise, please place a TOC consult.

## 2023-03-04 NOTE — Consult Note (Addendum)
Chief Complaint: Patient was seen in consultation today for aortic valve endocarditis  Referring Physician(s): Schertz,Robert  Supervising Physician: Simonne Come  Patient Status: [facility] - In-pt  History of Present Illness: [name] is a 62 y.o. male with past medical history of cardiomyopathy, HIV, HTN, ESRD on HD, admitted with acute and subacute bacterial endocarditis of the aortic valve diagnosed earlier this month at [facility]. Patient with history of medical non-compliance and multiple recent hospitalizations.  He has left AMA multiple times from various facilities however currently remains admitted at [facility] for ongoing chest  and abdominal pain. He was evaluated by psych and determined to have capactiy.  Given his recent endocarditis and TDC vegetation, line removal with holiday requested.  Discussed with medical team.  Will also plan to proceed with line replacement later this week.   Patient assessed while in [facility] for line removal.  He did have a line placed here in 2019, however has had several removals/replacements since then.  He reports no issues with right-sided placements despite multiple procedures. His right forearm is swollen today s/p IV infiltration.  He is agreeable to tunneled dialysis cathter removal today with replacement later this week.    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   Candidiasis of mouth    Cardiomyopathy    Chest pain, unspecified    Diseases of lips    Angular cheilitis   HIV infection    Hypertension    Lymphoma    Personal history of unspecified disease of respiratory system    Pneumocystosis    Shingles    Tobacco use     Past Surgical History:  Procedure Laterality Date   BASCILIC VEIN TRANSPOSITION Left 05/24/2018   Procedure: FIRST STAGE BASCILIC VEIN TRANSPOSITION;  Surgeon: Larina Earthly, MD;  Location: [facility] OR;  Service: Vascular;  Laterality: Left;   IR FLUORO GUIDE  CV LINE RIGHT  05/17/2018   IR US GUIDE VASC ACCESS RIGHT  05/17/2018    Allergies: Penicillins, Pepcid [famotidine], and Prilosec [omeprazole]  Medications: Prior to Admission medications   Medication Sig Start Date End Date Taking? Authorizing Provider  amiodarone (PACERONE) 200 MG tablet Take 200 mg by mouth daily.    [provider]  amitriptyline (ELAVIL) 10 MG tablet Take 10 mg by mouth at bedtime. 12/04/21   [provider]  aspirin 81 MG chewable tablet Chew 1 tablet (81 mg total) by mouth daily. With Food 05/27/18   Shon Hale, MD  atorvastatin (LIPITOR) 80 MG tablet Take 80 mg by mouth daily. 01/04/23   [provider]  carvedilol (COREG) 12.5 MG tablet Take 1 tablet (12.5 mg total) by mouth 2 (two) times daily with a meal. Patient taking differently: Take 25 mg by mouth 2 (two) times daily with a meal. 05/26/18   Emokpae, Courage, MD  clopidogrel (PLAVIX) 75 MG tablet Take 75 mg by mouth daily. 10/03/22   [provider]  D3-50 1.25 MG (50000 UT) capsule Take 50,000 Units by mouth once a week. 01/27/23   [provider]  dapsone 100 MG tablet Take 1 tablet (100 mg total) by mouth daily. 05/26/18   Shon Hale, MD  dolutegravir (TIVICAY) 50 MG tablet Take 1 tablet (50 mg total) by mouth daily. 05/26/18   Emokpae, Courage, MD  ELIQUIS 5 MG TABS tablet Take 5 mg by mouth 2 (two) times daily. 05/17/22   [provider]  hydrALAZINE (APRESOLINE) 100 MG tablet Take 100 mg by mouth 3 (three) times daily.    [provider]  isosorbide mononitrate (IMDUR) 60 MG 24 hr tablet Take 60 mg by mouth daily. 01/04/23   [provider]  lisinopril (ZESTRIL) 10 MG tablet Take 10 mg by mouth daily. 02/16/23   [provider]  methocarbamol (ROBAXIN) 500 MG tablet Take 500 mg by mouth 4 (four) times daily.    [provider]  minoxidil (LONITEN) 10 MG tablet Take 10 mg by mouth 2 (two) times daily. 08/16/21    [provider]  mirtazapine (REMERON) 7.5 MG tablet Take 7.5 mg by mouth at bedtime. 01/04/23   [provider]  nitroGLYCERIN (NITROSTAT) 0.4 MG SL tablet Place 0.4 mg under the tongue every 5 (five) minutes as needed for chest pain. 04/27/19   [provider]  Oxycodone HCl 10 MG TABS Take 10 mg by mouth every 8 (eight) hours as needed (for pain). 10/02/22   [provider]  rilpivirine (EDURANT) 25 MG TABS tablet Take 1 tablet (25 mg total) by mouth daily with breakfast. 05/27/18   Shon Hale, MD  sevelamer carbonate (RENVELA) 800 MG tablet Take 1,600-2,400 mg by mouth See admin instructions. Take 3 tablets by mouth three times daily with meals and 2 tablets twice daily with snacks 12/04/21   [provider]     Family History  Problem Relation Age of Onset   Heart disease Mother    Diabetes Mother    Cancer Mother    Diabetes Sister    CAD Sister        ? stents   Diabetes Maternal Grandmother    Hypertension Brother     Social History   Socioeconomic History   Marital status: Single    Spouse name: Not on file   Number of children: Not on file   Years of education: Not on file   Highest education level: Not on file  Occupational History   Not on file  Tobacco Use   Smoking status: Former    Packs/day: 1.00    Years: 35.00    Additional pack years: 0.00    Total pack years: 35.00    Types: Cigarettes    Quit date: 11/23/2015    Years since quitting: 7.2   Smokeless tobacco: Never   Tobacco comments:    would like help quitting  Substance and Sexual Activity   Alcohol use: Yes    Alcohol/week: 0.0 standard drinks of alcohol    Comment: beer weekends   Drug use: No   Sexual activity: Yes    Partners: Female    Comment: pt. declined condoms  Other Topics Concern   Not on file  Social History Narrative   Single - unemployed   + EtOH weekend beer   Former smoker   + THC   Social Determinants of Health   Financial  Resource Strain: Not on file  Food Insecurity: Not on file  Transportation Needs: Not on file  Physical Activity: Not on file  Stress: Not on file  Social Connections: Not on file     Review of Systems: A 12 point ROS discussed and pertinent positives are indicated in the HPI above.  All other systems are negative.  Review of Systems  Constitutional:  Negative for fatigue and fever.  Respiratory:  Negative for cough and shortness of breath.   Cardiovascular:  Negative for chest pain.  Gastrointestinal:  Negative for  abdominal pain.  Musculoskeletal:  Negative for back pain.  Psychiatric/Behavioral:  Negative for behavioral problems and confusion.     Vital Signs: BP (!) 135/50 (BP Location: Right Arm)   Pulse 75   Temp 98 F (36.7 C)   Resp (!) 21   Ht  (1.651 m)   Wt 138 lb 10.7 oz (62.9 kg)   SpO2 98%   BMI 23.08 kg/m   Physical Exam Vitals and nursing note reviewed.  Constitutional:      General: He is not in acute distress.    Appearance: He is well-developed. He is not ill-appearing.  Cardiovascular:     Rate and Rhythm: Normal rate and regular rhythm.  Pulmonary:     Effort: Pulmonary effort is normal. No tachypnea.  Musculoskeletal:     Comments: Right forearm swelling, soft.   Skin:    General: Skin is warm and dry.  Neurological:     General: No focal deficit present.     Mental Status: He is alert.  Psychiatric:        Mood and Affect: Mood normal.        Behavior: Behavior normal.      MD Evaluation Airway: WNL Heart: WNL Abdomen: WNL Chest/ Lungs: WNL ASA  Classification: 3 Mallampati/Airway Score: Two   Imaging: DG Chest Portable 1 View  Result Date: 03/01/2023 CLINICAL DATA:  Chest pain. EXAM: PORTABLE CHEST 1 VIEW COMPARISON:  05/10/2018 FINDINGS: Mild cardiomegaly is seen. Prior CABG noted. Right jugular dual-lumen central venous catheter is seen with TIPS overlying the right atrium. No evidence of pneumothorax. Both lungs clear.   No evidence of pleural effusion. IMPRESSION: Mild cardiomegaly. No active lung disease. Electronically Signed   By: Danae Orleans M.D.   On: 03/01/2023 17:50    Labs:  CBC: Recent Labs    03/01/23 1730 03/02/23 0415 03/03/23 0409 03/04/23 0910  WBC 6.7 7.2 6.5 6.9  HGB 12.5* 11.6* 12.0* 12.3*  HCT 40.4 39.1 40.6 39.6  PLT 219 228 231 239    COAGS: No results for input(s): "INR", "APTT" in the last 8760 hours.  BMP: Recent Labs    03/01/23 2250 03/02/23 0415 03/03/23 0409 03/04/23 0910  NA 134* 134* 133* 137  K 6.3* 5.5* 5.0 4.8  CL 96* 95* 93* 94*  CO2 23 20* 28 26  GLUCOSE 86 77 111* 102*  BUN 63* 64* 42* 47*  CALCIUM 9.6 8.4* 8.6* 8.6*  CREATININE 8.46* 8.39* 6.91* 7.60*  GFRNONAA 7* 7* 8* 8*    LIVER FUNCTION TESTS: Recent Labs    03/01/23 1730 03/01/23 2000 03/02/23 0415 03/03/23 0409  BILITOT 1.0 0.9  --  0.5  AST 39 46*  --  30  ALT 24 25  --  24  ALKPHOS 76 83  --  76  PROT 8.2* 8.3*  --  8.2*  ALBUMIN 3.3* 3.4* 3.1* 3.4*    TUMOR MARKERS: No results for input(s): "AFPTM", "CEA", "CA199", "CHROMGRNA" in the last 8760 hours.  Assessment and Plan: End stage renal disease, on HD via right IJ tunneled line Acute on subacute aortic valve endocarditis Patient with admitted for aortic valve endocarditis. Also with history of end stage renal disease.  In need of line holiday per ID.  Tunneled catheter removed at bedside without complication.  Patient tolerated well.  Discussed need for line replacement after 48 hrs.  Plan made attempt replacement as early as Friday as schedule allows.  Vanc 1g ordered for procedure.  Patient  to be NPO p MN on Friday.   Risks and benefits discussed with the patient including, but not limited to bleeding, infection, vascular injury, pneumothorax which may require chest tube placement, air embolism or even death  All of the patient's questions were answered, patient is agreeable to proceed. Consent signed and in  chart.  Thank you for this interesting consult.  I greatly enjoyed meeting LLOYDE LUDLAM and look forward to participating in their care.  A copy of this report was sent to the requesting provider on this date.  Electronically Signed: Hoyt Koch, PA 03/04/2023, 10:38 AM   I spent a total of 20 Minutes    in face to face in clinical consultation, greater than 50% of which was counseling/coordinating care for aortic valve endocarditis, end stage renal disease.

## 2023-03-04 NOTE — Consult Note (Addendum)
WOC Nurse Consult Note: Reason for Consult: perirectal abscess  Wound type: infectious, patient states this is where a doctor "lanced" a boil  Pressure Injury POA: NA Measurement: R buttock/perirectal 0.5 cm x 0.5 cms x 0.1 cm  Wound bed: 100% pink moist  Drainage (amount, consistency, odor) minimal tan at this time  Periwound: slightly indurated  Dressing procedure/placement/frequency: Clean R buttock/perirectal abscess with soap and water, dry thoroughly, apply Mupirocin ointment to wound bed twice daily and cover with silicone foam or ABD pad depending on amount of drainage present.  May lift silicone foam daily to replace Mupirocin.  Change foam dressing q3 days and prn soiling.   Please give patient tube of Mupirocin upon discharge.   POC discussed with MD, patient and bedside nurse.  WOC will not follow at this time.  Re-consult if further needs arise.   Thank you,    Priscella Mann MSN, RN-BC, [employer] [phone]

## 2023-03-04 NOTE — Progress Notes (Signed)
PROGRESS NOTE    [name]  RUE:[id number] DOB: 12/07/1960 DOA: 03/02/2023 PCP: [name], MD    Chief Complaint  Patient presents with   Chest Pain    Brief Narrative:   [name] was admitted to the hospital with the working diagnosis of aortic valve endocarditis.    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 CT surgery determined patient was not a surgical candidate.  Patient left the hospital AMA, 04/11, then was re-hospitalized 04/13 but left again AMA on the 04/14.  Admitted to [facility] on 04/15 and left AMA later the same day.  At home with chest pain and presented back to the hospital.  On his initial physical examination his blood pressure was 156.73, HR 87, RR 16 and 02 saturation 100%, lungs with no wheezing or rales, heart with S1 and S2 present and rhythmic, abdomen with no distention and no lower extremity edema.    Na 134, K 5,5 Cl 95, bicarbonate 20, glucose 77 bun 64 cr 8.39 Wbc 7.7 hgb 11,6 plt 228    EKG 84 bpm, left axis deviation, left anterior fascicular block, qtc 503, sinus rhythm with 1st degree AV block, ST depression V5 and V6, no significant T wave changes. Positive LVH.   Assessment & Plan:   Principal Problem:   Aortic valve endocarditis Active Problems:   ESRD on hemodialysis   CHF (congestive heart failure)   Hypertension   Human immunodeficiency virus (HIV) disease (HCC)   Coronary artery disease   Paroxysmal atrial fibrillation   Depression   Aortic valve endocarditis Vegetation on tip of TDC  - ID input greatly appreciated, continue antibiotic therapy with IV vancomycin and cefepime. - TDC continued today, plan for line holiday.  ESRD on hemodialysis Hyperkalemia.  Hyponatremia -renal input  greatly appreciated, continue with HD   CHF (congestive heart failure) No clinical signs of decompensation. Volume management with HD   Hypertension Continue blood pressure control with carvedilol, isosorbide, hydralazine and lisinopril. Patient on minoxidil 7,5 mg daily.    Human immunodeficiency virus (HIV) disease (HCC) Continue antiretroviral therapy. Follow up with ID recommendations.    Coronary artery disease Patient with recent stent placement at [facility] on 12/2022 Patient had a in-stent re-stenosis and a drug eluding stent placed in the PDA, currently on dual antiplatelet therapy with aspirin, clopidogrel and statin.  -Patient with complaints of chest pain today, with some EKG changes, troponins are stable, cardiology consulted     Paroxysmal atrial fibrillation Continue anticoagulation with apixaban, Rate control with carvedilol and amiodarone.    Depression Continue with amitriptyline and mirtazapine.           DVT prophylaxis: Eliquis Code Status: (Full) Family Communication: NOne at bedside Disposition:   Status is: Inpatient    Consultants:  Renal ID cardiology   Subjective:  With complaint of chest pain earlier today, which resolved with sublingual nitro  Objective: Vitals:   03/04/23 1016 03/04/23 1018 03/04/23 1021 03/04/23 1302  BP: (!) 67/38  (!) 135/50 (!) 108/54  Pulse: 75 70 75 71  Resp: 17 19 (!) 21 14  Temp:  98 F (36.7 C)  97.8 F (36.6 C)  TempSrc:    Oral  SpO2: 98% 98% 98% 100%  Weight:  Height:        Intake/Output Summary (Last 24 hours) at 03/04/2023 1424 Last data filed at 03/03/2023 2242 Gross per 24 hour  Intake 597.81 ml  Output 2000 ml  Net -1402.19 ml   Filed Weights   03/03/23 1025 03/03/23 1316 03/03/23 1640  Weight: 63.5 kg 64.9 kg 62.9 kg    Examination:  Awake Alert, Oriented X 3, No new F.N deficits, Normal affect Symmetrical Chest wall movement, Good air movement  bilaterally, CTAB RRR,No Gallops,Rubs or new Murmurs, No Parasternal Heave +ve B.Sounds, Abd Soft, No tenderness, No rebound - guarding or rigidity. No Cyanosis, upper extremity edema at site of previous IV.    Data Reviewed: I have personally reviewed following labs and imaging studies  CBC: Recent Labs  Lab 03/01/23 1730 03/02/23 0415 03/03/23 0409 03/04/23 0910  WBC 6.7 7.2 6.5 6.9  NEUTROABS 3.3  --   --   --   HGB 12.5* 11.6* 12.0* 12.3*  HCT 40.4 39.1 40.6 39.6  MCV 91.2 92.4 91.4 89.0  PLT 219 228 231 239    Basic Metabolic Panel: Recent Labs  Lab 03/01/23 2000 03/01/23 2250 03/02/23 0415 03/03/23 0409 03/04/23 0910  NA 136  135 134* 134* 133* 137  K 6.7*  6.5* 6.3* 5.5* 5.0 4.8  CL 94*  94* 96* 95* 93* 94*  CO2 20* 28 26  GLUCOSE 104*  108* 86 77 111* 102*  BUN 63*  62* 63* 64* 42* 47*  CREATININE 8.75*  8.76* 8.46* 8.39* 6.91* 7.60*  CALCIUM 9.2  9.1 9.6 8.4* 8.6* 8.6*  PHOS  --   --  5.4*  --   --     GFR: Estimated Creatinine Clearance: 8.9 mL/min (A) (by C-G formula based on SCr of 7.6 mg/dL (H)).  Liver Function Tests: Recent Labs  Lab 03/01/23 1730 03/01/23 2000 03/02/23 0415 03/03/23 0409  AST 39 46*  --  30  ALT 24 25  --  24  ALKPHOS 76 83  --  76  BILITOT 1.0 0.9  --  0.5  PROT 8.2* 8.3*  --  8.2*  ALBUMIN 3.3* 3.4* 3.1* 3.4*    CBG: No results for input(s): "GLUCAP" in the last 168 hours.   Recent Results (from the past 240 hour(s))  Blood culture (routine x 2)     Status: None (Preliminary result)   Collection Time: 03/01/23  8:00 PM   Specimen: BLOOD  Result Value Ref Range Status   Specimen Description BLOOD LEFT ANTECUBITAL  Final   Special Requests   Final    BOTTLES DRAWN AEROBIC AND ANAEROBIC Blood Culture adequate volume   Culture   Final    NO GROWTH 3 DAYS Performed at [facility], [address] [place], [place] [address]    Report Status PENDING  Incomplete  Blood culture (routine x  2)     Status: None (Preliminary result)   Collection Time: 03/01/23  8:20 PM   Specimen: BLOOD RIGHT HAND  Result Value Ref Range Status   Specimen Description BLOOD RIGHT HAND  Final   Special Requests   Final    BOTTLES DRAWN AEROBIC AND ANAEROBIC Blood Culture adequate volume   Culture   Final    NO GROWTH 3 DAYS Performed at [facility], [address], [place], [place] [address]    Report Status PENDING  Incomplete         Radiology Studies: IR Removal Tun  Cv Cath W/O FL  Result Date: 03/04/2023 INDICATION: Patient with bacterial endocarditis, dialysis dependent via right IJ tunnel dialysis catheter placed at outside hospital. Request is made for removal for line holiday. EXAM: REMOVAL TUNNELED CENTRAL VENOUS CATHETER MEDICATIONS: 5 mL 1% lidocaine ANESTHESIA/SEDATION: None FLUOROSCOPY TIME:  None COMPLICATIONS: None immediate. PROCEDURE: Informed written consent was obtained from the patient after a thorough discussion of the procedural risks, benefits and alternatives. All questions were addressed. Maximal Sterile Barrier Technique was utilized including caps, mask, sterile gowns, sterile gloves, sterile drape, hand hygiene and skin antiseptic. A timeout was performed prior to the initiation of the procedure. The patient's right chest and catheter was prepped and draped in a normal sterile fashion. Heparin was removed from both ports of catheter. 1% lidocaine was used for local anesthesia. Using gentle blunt dissection the cuff of the catheter was exposed and the catheter was removed in it's entirety. Pressure was held till hemostasis was obtained. A sterile dressing was applied. The patient tolerated the procedure well with no immediate complications. IMPRESSION: Successful catheter removal as described above. Read by: Loyce Dys PA-C Electronically Signed   By: Simonne Come M.D.   On: 03/04/2023 11:10        Scheduled Meds:  amiodarone  200 mg Oral Daily    amitriptyline  10 mg Oral QHS   apixaban  5 mg Oral BID   aspirin  81 mg Oral Daily   atorvastatin  80 mg Oral Daily   carvedilol  25 mg Oral BID WC   Chlorhexidine Gluconate Cloth  6 each Topical Daily   Chlorhexidine Gluconate Cloth  6 each Topical Q0600   clopidogrel  75 mg Oral Daily   dapsone  100 mg Oral Daily   dolutegravir-rilpivirine  1 tablet Oral Q lunch   hydrALAZINE  100 mg Oral TID   isosorbide mononitrate  60 mg Oral Daily   lisinopril  10 mg Oral Daily   minoxidil  10 mg Oral BID   mirtazapine  7.5 mg Oral QHS   mupirocin ointment   Topical BID   nitroGLYCERIN       nitroGLYCERIN       sevelamer carbonate  2,400 mg Oral TID WC   sodium zirconium cyclosilicate  10 g Oral Daily   vancomycin variable dose per unstable renal function (pharmacist dosing)   Does not apply See admin instructions   Continuous Infusions:   LOS: 2 days      Huey Bienenstock, MD [facility]   To contact the attending provider between 7A-7P or the covering provider during after hours 7P-7A, please log into the web site [email or web address] and access using universal [place] password for that web site. If you do not have the password, please call the hospital operator.  03/04/2023, 2:24 PM

## 2023-03-04 NOTE — Progress Notes (Signed)
[facility] Progress Note   Subjective:  Seen in room - s/p TDC removal this AM. No CP/dyspnea. Next HD tentatively Friday after new line placement.  Objective Vitals:   03/04/23 0730 03/04/23 1016 03/04/23 1018 03/04/23 1021  BP: 134/80 (!) 67/38  (!) 135/50  Pulse: 83 75 70 75  Resp: (!) (!) 21  Temp:   98 F (36.7 C)   TempSrc:      SpO2: 100% 98% 98% 98%  Weight:      Height:       Physical Exam General: Well appaering man, NAD. Room air. Heart: RRR; no murmur Lungs: CTAB; no rales Abdomen: soft Extremities: no LE edema Dialysis Access: none  Additional Objective Labs: Basic Metabolic Panel: Recent Labs  Lab 03/02/23 0415 03/03/23 0409 03/04/23 0910  NA 134* 133* 137  K 5.5* 5.0 4.8  CL 95* 93* 94*  CO2 20* 28 26  GLUCOSE 77 111* 102*  BUN 64* 42* 47*  CREATININE 8.39* 6.91* 7.60*  CALCIUM 8.4* 8.6* 8.6*  PHOS 5.4*  --   --    Liver Function Tests: Recent Labs  Lab 03/01/23 1730 03/01/23 2000 03/02/23 0415 03/03/23 0409  AST 39 46*  --  30  ALT 24 25  --  24  ALKPHOS 76 83  --  76  BILITOT 1.0 0.9  --  0.5  PROT 8.2* 8.3*  --  8.2*  ALBUMIN 3.3* 3.4* 3.1* 3.4*   CBC: Recent Labs  Lab 03/01/23 1730 03/02/23 0415 03/03/23 0409 03/04/23 0910  WBC 6.7 7.2 6.5 6.9  NEUTROABS 3.3  --   --   --   HGB 12.5* 11.6* 12.0* 12.3*  HCT 40.4 39.1 40.6 39.6  MCV 91.2 92.4 91.4 89.0  PLT 219 228 231 239   Blood Culture    Component Value Date/Time   SDES BLOOD RIGHT HAND 03/01/2023 2020   SPECREQUEST  03/01/2023 2020    BOTTLES DRAWN AEROBIC AND ANAEROBIC Blood Culture adequate volume   CULT  03/01/2023 2020    NO GROWTH 3 DAYS Performed at [facility], [address], [place], [place] [address]    REPTSTATUS PENDING 03/01/2023 2020   Studies/Results: IR Removal Tun Cv Cath W/O FL  Result Date: 03/04/2023 INDICATION: Patient with bacterial endocarditis, dialysis dependent via right IJ tunnel dialysis catheter  placed at outside hospital. Request is made for removal for line holiday. EXAM: REMOVAL TUNNELED CENTRAL VENOUS CATHETER MEDICATIONS: 5 mL 1% lidocaine ANESTHESIA/SEDATION: None FLUOROSCOPY TIME:  None COMPLICATIONS: None immediate. PROCEDURE: Informed written consent was obtained from the patient after a thorough discussion of the procedural risks, benefits and alternatives. All questions were addressed. Maximal Sterile Barrier Technique was utilized including caps, mask, sterile gowns, sterile gloves, sterile drape, hand hygiene and skin antiseptic. A timeout was performed prior to the initiation of the procedure. The patient's right chest and catheter was prepped and draped in a normal sterile fashion. Heparin was removed from both ports of catheter. 1% lidocaine was used for local anesthesia. Using gentle blunt dissection the cuff of the catheter was exposed and the catheter was removed in it's entirety. Pressure was held till hemostasis was obtained. A sterile dressing was applied. The patient tolerated the procedure well with no immediate complications. IMPRESSION: Successful catheter removal as described above. Read by: Loyce Dys PA-C Electronically Signed   By: Simonne Come M.D.   On: 03/04/2023 11:10   Medications:   amiodarone  200 mg  Oral Daily   amitriptyline  10 mg Oral QHS   apixaban  5 mg Oral BID   aspirin  81 mg Oral Daily   atorvastatin  80 mg Oral Daily   carvedilol  25 mg Oral BID WC   Chlorhexidine Gluconate Cloth  6 each Topical Daily   Chlorhexidine Gluconate Cloth  6 each Topical Q0600   clopidogrel  75 mg Oral Daily   dapsone  100 mg Oral Daily   dolutegravir-rilpivirine  1 tablet Oral Q lunch   hydrALAZINE  100 mg Oral TID   isosorbide mononitrate  60 mg Oral Daily   lisinopril  10 mg Oral Daily   minoxidil  10 mg Oral BID   mirtazapine  7.5 mg Oral QHS   mupirocin ointment   Topical BID   nitroGLYCERIN       nitroGLYCERIN       sevelamer carbonate  2,400 mg Oral  TID WC   vancomycin variable dose per unstable renal function (pharmacist dosing)   Does not apply See admin instructions    Dialysis Orders: TTS [place]/ [place] [facility] 4h  420/ 800   68kg   2/2/ bath  Heparin ?  RIJ TDC  Assessment/Plan: AoV endocarditis - vegetation on tip of TDC: Blood Cx negative from early April at [facility]. TDC removed 4/17, plan is to replace [facility] Friday AM followed by HD. On Vancomycin + Cefepime with HD x 6 week course. ESRD: Usual TTS schedule - HD yesterday (4/16) - next tentatively Friday (4/19) after new TDC placement. K 4.8 today - will add Lokelma 10g today and tomorrow to hold him over - rec low K and low fluid diet.  HTN/volume: BP controlled, on multiple HTN meds. Close to euvolemic, well below prior EDW. 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  Secondary HPTH: Ca/Phos ok - continue sevelamer as binder. HIV: Continue home meds Hx perirectal boil  Ozzie Hoyle, PA-C 03/04/2023, 12:37 PM  [facility]

## 2023-03-04 NOTE — Progress Notes (Signed)
Interventional Radiology Brief Note:  Spoke with pharmacy.  No additional Vanc needed pre-procedure given current Van dosing with appropriate trough levels.   Loyce Dys, MS RD PA-C 11:21 AM

## 2023-03-04 NOTE — Significant Event (Signed)
The patient yells out and reports chest pain, upon arrival to the room, the patient is belligerent to the responding nurses and request we leave him alone, the patient is informed if he needs help with chest pain then he has to allow [place] to provide intervention.  The patient request for the nurses to get him out of this hospital.  The charge nurse at bedside and has contacted the MD, EKG is performed and nitroglycerin given x3.  Chest pain improves from from 10/10 to 0/10 after last nitro.  VS recorded

## 2023-03-04 NOTE — Consult Note (Addendum)
Cardiology Consultation   Patient ID: [name] MRN: [id number]; DOB: 02-08-1961  Admit date: 03/02/2023 Date of Consult: 03/04/2023  PCP:  [name], MD   [facility] Providers Cardiologist:  Chrystie Nose, MD     Patient Profile:   [name] is a 62 y.o. male with a hx of HIV, hypertension, ESRD on HD, paroxysmal atrial fibrillation, CAD s/p 4v CABG with recent s/p stent SVG-rPDA 12/2022 and aortic valve endocarditis and  who is being seen 03/04/2023 for the evaluation of chest pain/abnormal EKG at the request of Dr. Randol Kern.    History of Present Illness:   [name] is a is a 62 year old male with past medical history noted above.  He was seen back in 2019 when he presented with chest pain. Echocardiogram showed LVEF of 45 to 50% with diffuse hypokinesis with grade 1 diastolic dysfunction and a trivial pericardial effusion.  He underwent Lexiscan Myoview with a small area of reversible ischemia.  He was treated medically.  Underwent cardiac catheterization June 2020 multiple stents including the LAD, diagonal, circumflex and RCA.   He underwent four-vessel CABG 09/2021 at [facility] with LIMA to LAD, RIMA T graft to ramus intermediate/OM1, SVG to PDA.  EF at that time of 40%.  Presented to [facility] 12/2022 with a non-STEMI from outside hospital and underwent cardiac catheterization which showed patent arterial grafts with successful PCI to 75% stenosis of SVG to PDA to in-stent restenosis.  Placed on aspirin, Plavix and continued on Eliquis for a total of 7 days, then continue Plavix and Eliquis.   Most recently he was hospitalized at [facility] in early April 2024 and TEE showed aortic root abscess with vegetation on the aortic valve and dialysis catheter.  He was evaluated by CT surgery and determined not to be a surgical candidate.  He left the hospital AMA on 4/11.  Presented back to the hospital on 4/13 but again left AMA on 4/14.   He  presented to [facility] on 4/15 and found to have a potassium of 6.7.  He underwent hemodialysis and left AMA later that same day.  He was admitted the evening of 4/15 with complaints of chest pain.  He was evaluated by infectious disease laced on IV antibiotics with vancomycin and cefepime.  His HD catheter has now been removed for a line holiday for a total of 2 days then plans to replace new HD catheter.  On the morning of 4/17 he complained of worsening chest pain and was given 2 sublingual nitroglycerin no significant improvement in pain.  EKG showed sinus rhythm with first-degree AV block, 84 bpm, LVH with repolarization.  Cardiology asked to evaluate.  In talking with the patient he reports that he has had pretty constant chest pain for the past several days, sharp in nature and shortness of breath.  States the pain is worsened with deep inspiration.  Did have improvement with narcotic pain medication.  Currently complains of pain all over his body, states he has arthritis "everywhere".   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   Candidiasis of mouth    Cardiomyopathy    Chest pain, unspecified    Diseases of lips    Angular cheilitis   HIV infection    Hypertension    Lymphoma    Personal history of unspecified disease of respiratory system    Pneumocystosis  Shingles    Tobacco use     Past Surgical History:  Procedure Laterality Date   BASCILIC VEIN TRANSPOSITION Left 05/24/2018   Procedure: FIRST STAGE BASCILIC VEIN TRANSPOSITION;  Surgeon: Larina Earthly, MD;  Location: [facility] OR;  Service: Vascular;  Laterality: Left;   IR FLUORO GUIDE CV LINE RIGHT  05/17/2018   IR REMOVAL TUN CV CATH W/O FL  03/04/2023   IR US GUIDE VASC ACCESS RIGHT  05/17/2018     Home Medications:  Prior to Admission medications   Medication Sig Start Date End Date Taking? Authorizing Provider  amiodarone (PACERONE) 200 MG tablet Take 200 mg by  mouth daily.    [provider]  amitriptyline (ELAVIL) 10 MG tablet Take 10 mg by mouth at bedtime. 12/04/21   [provider]  aspirin 81 MG chewable tablet Chew 1 tablet (81 mg total) by mouth daily. With Food 05/27/18   Shon Hale, MD  atorvastatin (LIPITOR) 80 MG tablet Take 80 mg by mouth daily. 01/04/23   [provider]  carvedilol (COREG) 12.5 MG tablet Take 1 tablet (12.5 mg total) by mouth 2 (two) times daily with a meal. Patient taking differently: Take 25 mg by mouth 2 (two) times daily with a meal. 05/26/18   Emokpae, Courage, MD  clopidogrel (PLAVIX) 75 MG tablet Take 75 mg by mouth daily. 10/03/22   [provider]  D3-50 1.25 MG (50000 UT) capsule Take 50,000 Units by mouth once a week. 01/27/23   [provider]  dapsone 100 MG tablet Take 1 tablet (100 mg total) by mouth daily. 05/26/18   Shon Hale, MD  dolutegravir (TIVICAY) 50 MG tablet Take 1 tablet (50 mg total) by mouth daily. 05/26/18   Emokpae, Courage, MD  ELIQUIS 5 MG TABS tablet Take 5 mg by mouth 2 (two) times daily. 05/17/22   [provider]  hydrALAZINE (APRESOLINE) 100 MG tablet Take 100 mg by mouth 3 (three) times daily.    [provider]  isosorbide mononitrate (IMDUR) 60 MG 24 hr tablet Take 60 mg by mouth daily. 01/04/23   [provider]  lisinopril (ZESTRIL) 10 MG tablet Take 10 mg by mouth daily. 02/16/23   [provider]  methocarbamol (ROBAXIN) 500 MG tablet Take 500 mg by mouth 4 (four) times daily.    [provider]  minoxidil (LONITEN) 10 MG tablet Take 10 mg by mouth 2 (two) times daily. 08/16/21   [provider]  mirtazapine (REMERON) 7.5 MG tablet Take 7.5 mg by mouth at bedtime. 01/04/23   [provider]  nitroGLYCERIN (NITROSTAT) 0.4 MG SL tablet Place 0.4 mg under the tongue every 5 (five) minutes as needed for chest pain. 04/27/19   [provider]  Oxycodone HCl 10 MG TABS Take  10 mg by mouth every 8 (eight) hours as needed (for pain). 10/02/22   [provider]  rilpivirine (EDURANT) 25 MG TABS tablet Take 1 tablet (25 mg total) by mouth daily with breakfast. 05/27/18   Shon Hale, MD  sevelamer carbonate (RENVELA) 800 MG tablet Take 1,600-2,400 mg by mouth See admin instructions. Take 3 tablets by mouth three times daily with meals and 2 tablets twice daily with snacks 12/04/21   [provider]    Inpatient Medications: Scheduled Meds:  amiodarone  200 mg Oral Daily   amitriptyline  10 mg Oral QHS   apixaban  5 mg Oral BID   aspirin  81 mg Oral Daily  atorvastatin  80 mg Oral Daily   carvedilol  25 mg Oral BID WC   Chlorhexidine Gluconate Cloth  6 each Topical Daily   Chlorhexidine Gluconate Cloth  6 each Topical Q0600   clopidogrel  75 mg Oral Daily   dapsone  100 mg Oral Daily   dolutegravir-rilpivirine  1 tablet Oral Q lunch   hydrALAZINE  100 mg Oral TID   isosorbide mononitrate  60 mg Oral Daily   lisinopril  10 mg Oral Daily   minoxidil  10 mg Oral BID   mirtazapine  7.5 mg Oral QHS   mupirocin ointment   Topical BID   nitroGLYCERIN       nitroGLYCERIN       sevelamer carbonate  2,400 mg Oral TID WC   sodium zirconium cyclosilicate  10 g Oral Daily   vancomycin variable dose per unstable renal function (pharmacist dosing)   Does not apply See admin instructions   Continuous Infusions:  PRN Meds: acetaminophen **OR** acetaminophen, albuterol, HYDROmorphone (DILAUDID) injection, nitroGLYCERIN, nitroGLYCERIN, nitroGLYCERIN, ondansetron **OR** ondansetron (ZOFRAN) IV, oxyCODONE, senna-docusate, sevelamer carbonate, traMADol, traZODone  Allergies:    Allergies  Allergen Reactions   Penicillins Anaphylaxis    Has patient had a PCN reaction causing immediate rash, facial/tongue/throat swelling, SOB or lightheadedness with hypotension: Unknown Has patient had a PCN reaction causing severe rash involving mucus membranes or skin  necrosis: Unknown Has patient had a PCN reaction that required hospitalization: Unknown Has patient had a PCN reaction occurring within the last 10 years: No If all of the above answers are "NO", then may proceed with Cephalosporin use.   Pepcid [Famotidine] Other (See Comments)   Prilosec [Omeprazole] Other (See Comments)    Social History:   Social History   Socioeconomic History   Marital status: Single    Spouse name: Not on file   Number of children: Not on file   Years of education: Not on file   Highest education level: Not on file  Occupational History   Not on file  Tobacco Use   Smoking status: Former    Packs/day: 1.00    Years: 35.00    Additional pack years: 0.00    Total pack years: 35.00    Types: Cigarettes    Quit date: 11/23/2015    Years since quitting: 7.2   Smokeless tobacco: Never   Tobacco comments:    would like help quitting  Substance and Sexual Activity   Alcohol use: Yes    Alcohol/week: 0.0 standard drinks of alcohol    Comment: beer weekends   Drug use: No   Sexual activity: Yes    Partners: Female    Comment: pt. declined condoms  Other Topics Concern   Not on file  Social History Narrative   Single - unemployed   + EtOH weekend beer   Former smoker   + THC   Social Determinants of Health   Financial Resource Strain: Not on file  Food Insecurity: Not on file  Transportation Needs: Not on file  Physical Activity: Not on file  Stress: Not on file  Social Connections: Not on file  Intimate Partner Violence: Not on file    Family History:    Family History  Problem Relation Age of Onset   Heart disease Mother    Diabetes Mother    Cancer Mother    Diabetes Sister    CAD Sister        ? stents   Diabetes Maternal Grandmother  Hypertension Brother      ROS:  Please see the history of present illness.   All other ROS reviewed and negative.     Physical Exam/Data:   Vitals:   03/04/23 1016 03/04/23 1018 03/04/23  1021 03/04/23 1302  BP: (!) 67/38  (!) 135/50 (!) 108/54  Pulse: 75 70 75 71  Resp: 17 19 (!) 21 14  Temp:  98 F (36.7 C)  97.8 F (36.6 C)  TempSrc:    Oral  SpO2: 98% 98% 98% 100%  Weight:      Height:        Intake/Output Summary (Last 24 hours) at 03/04/2023 1610 Last data filed at 03/03/2023 2242 Gross per 24 hour  Intake 365.33 ml  Output 2000 ml  Net -1634.67 ml      03/03/2023    4:40 PM 03/03/2023    1:16 PM 03/03/2023   10:25 AM  Last 3 Weights  Weight (lbs) 138 lb 10.7 oz 143 lb 1.3 oz 140 lb  Weight (kg) 62.9 kg 64.9 kg 63.504 kg     Body mass index is 23.08 kg/m.  General:  Well nourished, well developed, in no acute distress HEENT: normal Neck: no JVD Vascular: No carotid bruits; Distal pulses 2+ bilaterally Cardiac:  normal S1, S2; RRR; no murmur  Lungs:  clear to auscultation bilaterally, no wheezing, rhonchi or rales  Abd: soft, nontender, no hepatomegaly  Ext: no edema Musculoskeletal:  No deformities, BUE and BLE strength normal and equal Skin: warm and dry  Neuro:  CNs 2-12 intact, no focal abnormalities noted Psych:  Normal affect   EKG:  The EKG was personally reviewed and demonstrates:  sinus rhythm with first-degree AV block, 84 bpm, LVH with repolarization Telemetry:  Telemetry was personally reviewed and demonstrates:  sinus rhythm  Relevant CV Studies:  Echo 02/2023 ([facility])          Size: Normal    Dissection: INDETERM FOR DISSECTION   AORTIC VALVE      Leaflets: UNKNOWN               Morphology: SEVERELY THICKENED      Mobility: PARTIALLY MOBILE      AOV Note: SMALL, MOBILE TARGET SEEN ON RIGHT CORONARY CUSP   LEFT VENTRICLE                                      Anterior: Normal          Size: Normal                                 Lateral: Normal   Contraction: Normal                                  Septal: Normal    Closest EF: 50% (Estimated)  Calc.EF: 55% (3D)      Apical: Normal     LV masses: No Masses                              Inferior: Normal           LVH: MILD LVH  Posterior: Normal   LV GLS(GE): -10.8% Normal Range [ <= -16]  Dias.FxClass: INDETERMINATE   MITRAL VALVE      Leaflets: ABNORMAL                Mobility: Fully mobile    Morphology: ANNULAR CALC   LEFT ATRIUM          Size: SEVERELY ENLARGED     LA masses: No masses                Normal IAS   MAIN PA          Size: DILATED   PULMONIC VALVE    Morphology: Normal      Mobility: Fully Mobile   RIGHT VENTRICLE          Size: MILDLY ENLARGED           Free wall: HYPOCONTRACTILE   Contraction: MILD GLOBAL DECREASE      RV masses: No Masses        RV GLS: -12.9% Normal Range [ <= -15]         TAPSE:   1.3 cm,  Normal Range [>= 1.6 cm]       RV Note: TV S' = 8 cm/s   TRICUSPID VALVE      Leaflets: Normal                  Mobility: Fully mobile    Morphology: Normal   RIGHT ATRIUM          Size: MODERATELY ENLARGED        RA Other: None     RA masses: CATHETER IN RA   PERICARDIUM        Fluid: No effusion   INFERIOR VENACAVA          Size: Normal     ABNORMAL RESPIRATORY COLLAPSE   DOPPLER ECHO and OTHER SPECIAL PROCEDURES ------------------------------------     Aortic: SEVERE AR              MODERATE AS      3.2 m/s peak vel   40 mmHg peak grad  27 mmHg mean grad 1.3 cm2 by DOPPLER   LVOT Diam: 2.3 cm. Resting LVOT Vel: 1.0 m/s. Dimensionless Index: 0.32      Mitral: MILD MR                No MS     MV Inflow E Vel.= nm* cm/s  MV Annulus E'Vel.= nm* cm/s  E/E'Ratio= nm*   Tricuspid: MILD TR                No TS   Pulmonary: TRIVIAL PR             No PS       Other:   INTERPRETATION ---------------------------------------------------------------    MILD LV DYSFUNCTION (See above) WITH MILD LVH    MILD RV SYSTOLIC DYSFUNCTION (See above)    VALVULAR REGURGITATION: SEVERE AR, MILD MR, TRIVIAL PR, MILD TR    VALVULAR STENOSIS: MODERATE AS    --SEVERE DIFFUSE THICKENING OF AORTIC VALVE WITH  SMALL, MOBILE TARGET SEEN    ON RIGHT CORONARY CUSP; ECHODENSE TISSUE AROUND AORTIC VALVE IS    CONCERNING FOR AORTIC ABSCESS.    --ASSOCIATED SEVERE AR    --SMALL OSCILATING MASS ON MV, DEGENERATIVE CHANGES VS. VEGETATION    3D acquisition and reconstructions were performed as part of this    examination to more accurately quantify the  effects of identified    structural abnormalities as part of the exam. (post-processing on an    Independent workstation).   Laboratory Data:  High Sensitivity Troponin:   Recent Labs  Lab 03/01/23 1730 03/01/23 2000 03/04/23 0911  TROPONINIHS 86* 105* 89*     Chemistry Recent Labs  Lab 03/02/23 0415 03/03/23 0409 03/04/23 0910  NA 134* 133* 137  K 5.5* 5.0 4.8  CL 95* 93* 94*  CO2 20* 28 26  GLUCOSE 77 111* 102*  BUN 64* 42* 47*  CREATININE 8.39* 6.91* 7.60*  CALCIUM 8.4* 8.6* 8.6*  GFRNONAA 7* 8* 8*  ANIONGAP 19* 12 17*    Recent Labs  Lab 03/01/23 1730 03/01/23 2000 03/02/23 0415 03/03/23 0409  PROT 8.2* 8.3*  --  8.2*  ALBUMIN 3.3* 3.4* 3.1* 3.4*  AST 39 46*  --  30  ALT 24 25  --  24  ALKPHOS 76 83  --  76  BILITOT 1.0 0.9  --  0.5   Lipids No results for input(s): "CHOL", "TRIG", "HDL", "LABVLDL", "LDLCALC", "CHOLHDL" in the last 168 hours.  Hematology Recent Labs  Lab 03/02/23 0415 03/03/23 0409 03/04/23 0910  WBC 7.2 6.5 6.9  RBC 4.23 4.44 4.45  HGB 11.6* 12.0* 12.3*  HCT 39.1 40.6 39.6  MCV 92.4 91.4 89.0  MCH 27.4 27.0 27.6  MCHC 29.7* 29.6* 31.1  RDW 19.7* 19.2* 18.6*  PLT 228 231 239   Thyroid No results for input(s): "TSH", "FREET4" in the last 168 hours.  BNPNo results for input(s): "BNP", "PROBNP" in the last 168 hours.  DDimer No results for input(s): "DDIMER" in the last 168 hours.   Radiology/Studies:  IR Removal Tun Cv Cath W/O FL  Result Date: 03/04/2023 INDICATION: Patient with bacterial endocarditis, dialysis dependent via right IJ tunnel dialysis catheter placed at outside hospital.  Request is made for removal for line holiday. EXAM: REMOVAL TUNNELED CENTRAL VENOUS CATHETER MEDICATIONS: 5 mL 1% lidocaine ANESTHESIA/SEDATION: None FLUOROSCOPY TIME:  None COMPLICATIONS: None immediate. PROCEDURE: Informed written consent was obtained from the patient after a thorough discussion of the procedural risks, benefits and alternatives. All questions were addressed. Maximal Sterile Barrier Technique was utilized including caps, mask, sterile gowns, sterile gloves, sterile drape, hand hygiene and skin antiseptic. A timeout was performed prior to the initiation of the procedure. The patient's right chest and catheter was prepped and draped in a normal sterile fashion. Heparin was removed from both ports of catheter. 1% lidocaine was used for local anesthesia. Using gentle blunt dissection the cuff of the catheter was exposed and the catheter was removed in it's entirety. Pressure was held till hemostasis was obtained. A sterile dressing was applied. The patient tolerated the procedure well with no immediate complications. IMPRESSION: Successful catheter removal as described above. Read by: Loyce Dys PA-C Electronically Signed   By: Simonne Come M.D.   On: 03/04/2023 11:10   DG Chest Portable 1 View  Result Date: 03/01/2023 CLINICAL DATA:  Chest pain. EXAM: PORTABLE CHEST 1 VIEW COMPARISON:  05/10/2018 FINDINGS: Mild cardiomegaly is seen. Prior CABG noted. Right jugular dual-lumen central venous catheter is seen with TIPS overlying the right atrium. No evidence of pneumothorax. Both lungs clear.  No evidence of pleural effusion. IMPRESSION: Mild cardiomegaly. No active lung disease. Electronically Signed   By: Danae Orleans M.D.   On: 03/01/2023 17:50     Assessment and Plan:   [name] is a 62 y.o. male with a hx of HIV,  hypertension, ESRD on HD, paroxysmal atrial fibrillation, CAD s/p 4v CABG with recent s/p stent SVG-rPDA 12/2022 and aortic valve endocarditis who is being seen 03/04/2023  for the evaluation of chest pain/abnormal EKG at the request of Dr. Randol Kern.  Chest pain CAD s/p 4v CABG' 2022 (LIMA-LAD, RIMA T graft-RI/OM1, SVG-PDA) Recent PCI/DES to SVG-rPDA 12/2022 -- Initially underwent CABG in 2022 through [facility], most recent cardiac catheterization 12/2022 with PCI/DES to SVG to RPDA for in-stent restenosis.  He was placed on triple therapy for 1 week and then recommended to continue on Plavix and aspirin.  He reports with his medications though he has a history of medication noncompliance.  -- EKG showed sinus rhythm, LVH with repolarization.  High-sensitivity troponin 89 -- does report chest pain but all with diffuse pain as well, pleuritic component to chest pain. SL nitro with little improvement of pain, reports dilaudid helps. -- repeat EKG, if no significant change would not anticipate further cardiac work up  -- on ASA ( though could likely drop this as he is a month out from PCI), plavix, statin, BB, Imdur  Aortic Valve Endocarditis Aortic root abscess HD catheter infection -- ID following, antibiotic therapy includes vancomycin and cefepime -- Recently evaluated by CT surgery at [facility] and felt not to be a candidate for valve surgery  ESRD on HD Hyperkalemia -- Nephrology following, on Tuesday Thursday Saturday schedule -- HD catheter removed for line holiday  HTN -- Overall controlled -- Continue Coreg 25 mg twice daily, hydralazine 100 mg 3 times daily, Imdur 60 mg daily, lisinopril 10 mg daily  Paroxysmal Afib -- In sinus rhythm -- Continue amiodarone 200 mg daily, Eliquis 5 mg twice daily  HIV Depression  Risk Assessment/Risk Scores:   CHA2DS2-VASc Score = 4  This indicates a 4.8% annual risk of stroke. The patient's score is based upon: CHF History: 1 HTN History: 1 Diabetes History: 1 Stroke History: 0 Vascular Disease History: 1 Age Score: 0 Gender Score: 0   For questions or updates, please contact  HeartCare Please  consult [email or web address] for contact info under    Signed, Laverda Page, NP  03/04/2023 4:10 PM   ATTENDING ATTESTATION:  After conducting a review of all available clinical information with the care team, interviewing the patient, and performing a physical exam, I agree with the findings and plan described in this note.   GEN: No acute distress.   HEENT:  MMM, no JVD, no scleral icterus Cardiac: RRR, no murmurs, rubs, or gallops.  Respiratory: Clear to auscultation bilaterally. GI: Soft, nontender, non-distended  MS: No edema; No deformity. Neuro:  Nonfocal  Vasc:  +2 radial pulses; old LUE fistula  Patient is a 62 year old male with complex medical history including end-stage renal disease on hemodialysis, history of HIV, hypertension, paroxysmal atrial fibrillation on Eliquis, coronary artery disease status post multivessel CABG in 2022 consisting of a LIMA to LAD, RIMA T graft to ramus intermedius and OM1, and vein graft to PDA with subsequent PCI of vein graft to PDA who is here due to chest pain.  He has multiple admissions associated with leaving AGAINST MEDICAL ADVICE.  Of note he was diagnosed with a root abscess at [facility] but not thought to be a candidate for surgery.  He had leaving the hospital AMA.  This morning he developed chest pain at rest.  An EKG demonstrated left ventricular hypertrophy with somewhat increased ST depressions in the anterolateral leads.  His chest pain resolved with nitroglycerin.  His  troponins are mildly elevated and decreasing.  He is chest pain-free now.  Given his poor insight and other comorbidities I think he is best treated medically for chest pain.  Certainly if he were to develop chest pain refractory to medical therapy, ventricular tachycardia, or cardiogenic shock urgent cardiac catheterization would be indicated.  His chest pain is likely multifactorial and associated with his aortic root abscess.  Continue medical therapy.  Will DC  aspirin given that patient is on Eliquis.  Alverda Skeans, MD Pager [phone]

## 2023-03-04 NOTE — Progress Notes (Signed)
Pharmacy Antibiotic Note  [name] is a 62 y.o. male admitted on 03/02/2023 with culture negative endocarditis.  Pharmacy has been consulted for Cefepime + Vancomycin dosing.  The patient's evening Vancomycin dose was noted to be infiltrated. Linezolid 600 mg po x 1 dose was given. A random level this morning resulted as 39 mcg/ml however was taken from the same arm that the Vancomycin infiltrated and above the site. Level likely inaccurate and high however can be presumed true level likely >15 mcg/ml (goal 15-25 mcg/ml) so will hold additional doses for now and recheck again with his next HD session.   The patient has a new PIV placed in the RUE however RN not confident site will last. Will transition Rocephin to Cefepime and plan for HD dosing in case PIV is lost again.   Plan: - Cefepime 2g IV x 1 - f/u HD schedule for additional dosing - No additional Vancomycin needed at this time - will f/u on re-dosing with HD as needed - Will plan for a pre-HD level prior to the next HD session - Will continue to follow HD schedule/duration, culture results, LOT, and antibiotic de-escalation plans   Height:  (165.1 cm) Weight: 62.9 kg (138 lb 10.7 oz) IBW/kg (Calculated) : 61.5  Temp (24hrs), Avg:98.1 F (36.7 C), Min:97.6 F (36.4 C), Max:98.7 F (37.1 C)  Recent Labs  Lab 03/01/23 1730 03/01/23 2000 03/01/23 2250 03/02/23 0415 03/03/23 0409  WBC 6.7  --   --  7.2 6.5  CREATININE 8.46* 8.75*  8.76* 8.46* 8.39* 6.91*  VANCORANDOM  --  16  --   --   --     Estimated Creatinine Clearance: 9.8 mL/min (A) (by C-G formula based on SCr of 6.91 mg/dL (H)).    Allergies  Allergen Reactions   Penicillins Anaphylaxis    Has patient had a PCN reaction causing immediate rash, facial/tongue/throat swelling, SOB or lightheadedness with hypotension: Unknown Has patient had a PCN reaction causing severe rash involving mucus membranes or skin necrosis: Unknown Has patient had a PCN  reaction that required hospitalization: Unknown Has patient had a PCN reaction occurring within the last 10 years: No If all of the above answers are "NO", then may proceed with Cephalosporin use.   Pepcid [Famotidine] Other (See Comments)   Prilosec [Omeprazole] Other (See Comments)    Antimicrobials this admission: Vanc + CTX PTA at [facility] 4/16 >> Rocephin 4/16 >> 4/17 Cefepime 4/17 x 1  Dose adjustments this admission: 4/14 VR on admit after being at [facility] = 16 mcg/ml 4/17 VR 39 mcg/ml (from infiltrated arm so falsely high but still likely >15 mcg/ml)  Microbiology results: 4/14 BCx >> ngtd 4/17 BCx (ordered)  Thank you for allowing pharmacy to be a part of this patient's care.  Georgina Pillion, PharmD, BCPS Infectious Diseases Clinical Pharmacist 03/04/2023 2:31 PM   **Pharmacist phone directory can now be found on [email or web address] ([email or web address]).  Listed under [facility].

## 2023-03-05 ENCOUNTER — Other Ambulatory Visit (HOSPITAL_COMMUNITY): Payer: Self-pay

## 2023-03-05 DIAGNOSIS — N186 End stage renal disease: Secondary | ICD-10-CM | POA: Diagnosis not present

## 2023-03-05 DIAGNOSIS — Z992 Dependence on renal dialysis: Secondary | ICD-10-CM | POA: Diagnosis not present

## 2023-03-05 DIAGNOSIS — I358 Other nonrheumatic aortic valve disorders: Secondary | ICD-10-CM | POA: Diagnosis not present

## 2023-03-05 DIAGNOSIS — I5021 Acute systolic (congestive) heart failure: Secondary | ICD-10-CM | POA: Diagnosis not present

## 2023-03-05 MED ORDER — JULUCA 50-25 MG PO TABS
1.0000 | ORAL_TABLET | Freq: Every day | ORAL | 0 refills | Status: DC
  Filled 2023-03-05: qty 30, 30d supply, fill #0

## 2023-03-05 MED ORDER — VANCOMYCIN IV (FOR PTA / DISCHARGE USE ONLY): 750.0000 mg | INTRAVENOUS | 0 refills | Status: AC

## 2023-03-05 MED ORDER — SODIUM CHLORIDE 0.9 % IV SOLN: 2.0000 g | INTRAVENOUS | Status: DC

## 2023-03-05 MED ORDER — SODIUM CHLORIDE 0.9 % IV SOLN
1.0000 g | INTRAVENOUS | Status: DC
  Administered 2023-03-05 – 2023-03-11 (×7): 1 g via INTRAVENOUS
  Filled 2023-03-05 (×8): qty 10

## 2023-03-05 MED ORDER — "THROMBI-PAD 3""X3"" EX PADS"
1.0000 | MEDICATED_PAD | Freq: Once | CUTANEOUS | Status: AC
  Administered 2023-03-05: 1 via TOPICAL
  Filled 2023-03-05: qty 1

## 2023-03-05 MED ORDER — CEFEPIME IV (FOR PTA / DISCHARGE USE ONLY)
2.0000 g | INTRAVENOUS | 0 refills | Status: AC
Start: 2023-03-05 — End: 2023-04-14

## 2023-03-05 NOTE — Progress Notes (Signed)
PHARMACY CONSULT NOTE FOR:  OUTPATIENT  PARENTERAL ANTIBIOTIC THERAPY (OPAT)  Informational as the patient plans to receive antibiotics outpatient at his HD center  Indication: Culture negative AV IE Regimen: Vancomycin 750 mg/HD-TTS + Cefepime 2g/HD-TTS End date: 04/14/23  IV antibiotic discharge orders are pended. To discharging provider:  please sign these orders via discharge navigator,  Select New Orders & click on the button choice - Manage This Unsigned Work.     Thank you for allowing pharmacy to be a part of this patient's care.  Georgina Pillion, PharmD, BCPS Infectious Diseases Clinical Pharmacist 03/05/2023 2:25 PM   **Pharmacist phone directory can now be found on [email or web address] ([id number]).  Listed under [facility].

## 2023-03-05 NOTE — Progress Notes (Signed)
PROGRESS NOTE    [name]  UXL:[id number] DOB: 05-May-1961 DOA: 03/02/2023 PCP: [name], MD    Chief Complaint  Patient presents with   Chest Pain    Brief Narrative:   [name] was admitted to the hospital with the working diagnosis of aortic valve endocarditis.    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 CT surgery determined patient was not a surgical candidate.  Patient left the hospital AMA, 04/11, then was re-hospitalized 04/13 but left again AMA on the 04/14.  Admitted to [facility] on 04/15 and left AMA later the same day.  At home with chest pain and presented back to the hospital.  On his initial physical examination his blood pressure was 156.73, HR 87, RR 16 and 02 saturation 100%, lungs with no wheezing or rales, heart with S1 and S2 present and rhythmic, abdomen with no distention and no lower extremity edema.    Na 134, K 5,5 Cl 95, bicarbonate 20, glucose 77 bun 64 cr 8.39 Wbc 7.7 hgb 11,6 plt 228    EKG 84 bpm, left axis deviation, left anterior fascicular block, qtc 503, sinus rhythm with 1st degree AV block, ST depression V5 and V6, no significant T wave changes. Positive LVH.   Assessment & Plan:   Principal Problem:   Aortic valve endocarditis Active Problems:   ESRD on hemodialysis   CHF (congestive heart failure)   Hypertension   Human immunodeficiency virus (HIV) disease (HCC)   Coronary artery disease   Paroxysmal atrial fibrillation   Depression   Aortic valve endocarditis Vegetation on tip of TDC  - ID input greatly appreciated, continue antibiotic therapy with IV vancomycin and cefepime. - TDC continued today, plan for line holiday. -No aortic root abscess.  EEG done at [facility] once  reviewed by her ID -[facility] discontinued yesterday 4/17, plan for new line tomorrow.  ESRD on hemodialysis Hyperkalemia. Hyponatremia -renal input  greatly appreciated, continue with HD, currently with line holiday   CHF (congestive heart failure) No clinical signs of decompensation. Volume management with HD   Hypertension Continue blood pressure control with carvedilol, isosorbide, hydralazine and lisinopril. Patient on minoxidil 7,5 mg daily.    Human immunodeficiency virus (HIV) disease (HCC) Continue antiretroviral therapy. Management per ID   Coronary artery disease Chest pain -Presents with intermittent chest pain, with some atypical features as it is improved by burping, troponins at baseline, mildly elevated in the setting of ESRD, non-ACS pattern -No indication for cardiac cath currently, continue with medical therapy including Eliquis, Plavix, Coreg, Imdur and lisinopril, aspirin has been discontinued as patient on Eliquis   Paroxysmal atrial fibrillation Continue anticoagulation with apixaban, Rate control with carvedilol and amiodarone.    Depression Continue with amitriptyline and mirtazapine.           DVT prophylaxis: Eliquis Code Status: (Full) Family Communication: NOne at bedside Disposition:   Status is: Inpatient    Consultants:  Renal ID cardiology   Subjective:  No significant events overnight as discussed with staff, patient reports chest pain improves with burping,  Objective: Vitals:   03/04/23 2356 03/05/23 0831 03/05/23 0919 03/05/23 1139  BP: (!) 110/45 (!) 121/53 (!) 143/61 (!) 99/46  Pulse: 78   75  Resp: 17 20  14 19  Temp: 97.7 F (36.5 C)   97.7 F (36.5 C)  TempSrc: Oral   Oral  SpO2:      Weight:      Height:       No intake or output data in the 24 hours ending 03/05/23 1555  Filed Weights   03/03/23 1025 03/03/23 1316 03/03/23 1640  Weight: 63.5 kg 64.9 kg 62.9 kg    Examination: Awake Alert, Oriented X 3, No  new F.N deficits, Normal affect Symmetrical Chest wall movement, Good air movement bilaterally, CTAB RRR,No Gallops,Rubs or new Murmurs, No Parasternal Heave +ve B.Sounds, Abd Soft, No tenderness, No rebound - guarding or rigidity. No Cyanosis, Clubbing or edema, No new Rash or bruise       Data Reviewed: I have personally reviewed following labs and imaging studies  CBC: Recent Labs  Lab 03/01/23 1730 03/02/23 0415 03/03/23 0409 03/04/23 0910  WBC 6.7 7.2 6.5 6.9  NEUTROABS 3.3  --   --   --   HGB 12.5* 11.6* 12.0* 12.3*  HCT 40.4 39.1 40.6 39.6  MCV 91.2 92.4 91.4 89.0  PLT 219 228 231 239    Basic Metabolic Panel: Recent Labs  Lab 03/01/23 2000 03/01/23 2250 03/02/23 0415 03/03/23 0409 03/04/23 0910  NA 136  135 134* 134* 133* 137  K 6.7*  6.5* 6.3* 5.5* 5.0 4.8  CL 94*  94* 96* 95* 93* 94*  CO2 20* 28 26  GLUCOSE 104*  108* 86 77 111* 102*  BUN 63*  62* 63* 64* 42* 47*  CREATININE 8.75*  8.76* 8.46* 8.39* 6.91* 7.60*  CALCIUM 9.2  9.1 9.6 8.4* 8.6* 8.6*  PHOS  --   --  5.4*  --   --     GFR: Estimated Creatinine Clearance: 8.9 mL/min (A) (by C-G formula based on SCr of 7.6 mg/dL (H)).  Liver Function Tests: Recent Labs  Lab 03/01/23 1730 03/01/23 2000 03/02/23 0415 03/03/23 0409  AST 39 46*  --  30  ALT 24 25  --  24  ALKPHOS 76 83  --  76  BILITOT 1.0 0.9  --  0.5  PROT 8.2* 8.3*  --  8.2*  ALBUMIN 3.3* 3.4* 3.1* 3.4*    CBG: No results for input(s): "GLUCAP" in the last 168 hours.   Recent Results (from the past 240 hour(s))  Blood culture (routine x 2)     Status: None (Preliminary result)   Collection Time: 03/01/23  8:00 PM   Specimen: BLOOD  Result Value Ref Range Status   Specimen Description BLOOD LEFT ANTECUBITAL  Final   Special Requests   Final    BOTTLES DRAWN AEROBIC AND ANAEROBIC Blood Culture adequate volume   Culture   Final    NO GROWTH 4 DAYS Performed at [facility], [address]  [place], [place] [address]    Report Status PENDING  Incomplete  Blood culture (routine x 2)     Status: None (Preliminary result)   Collection Time: 03/01/23  8:20 PM   Specimen: BLOOD RIGHT HAND  Result Value Ref Range Status   Specimen Description BLOOD RIGHT HAND  Final   Special Requests   Final    BOTTLES DRAWN AEROBIC AND ANAEROBIC Blood Culture adequate volume   Culture   Final    NO GROWTH 4 DAYS Performed at [facility], [address], [place], [place] [address]    Report Status PENDING  Incomplete         Radiology Studies: IR Removal Tun Cv Cath W/O FL  Result Date: 03/04/2023 INDICATION: Patient with bacterial endocarditis, dialysis dependent via right IJ tunnel dialysis catheter placed at outside hospital. Request is made for removal for line holiday. EXAM: REMOVAL TUNNELED CENTRAL VENOUS CATHETER MEDICATIONS: 5 mL 1% lidocaine ANESTHESIA/SEDATION: None FLUOROSCOPY TIME:  None COMPLICATIONS: None immediate. PROCEDURE: Informed written consent was obtained from the patient after a thorough discussion of the procedural risks, benefits and alternatives. All questions were addressed. Maximal Sterile Barrier Technique was utilized including caps, mask, sterile gowns, sterile gloves, sterile drape, hand hygiene and skin antiseptic. A timeout was performed prior to the initiation of the procedure. The patient's right chest and catheter was prepped and draped in a normal sterile fashion. Heparin was removed from both ports of catheter. 1% lidocaine was used for local anesthesia. Using gentle blunt dissection the cuff of the catheter was exposed and the catheter was removed in it's entirety. Pressure was held till hemostasis was obtained. A sterile dressing was applied. The patient tolerated the procedure well with no immediate complications. IMPRESSION: Successful catheter removal as described above. Read by: Loyce Dys PA-C Electronically Signed   By: Simonne Come M.D.   On:  03/04/2023 11:10        Scheduled Meds:  amiodarone  200 mg Oral Daily   amitriptyline  10 mg Oral QHS   apixaban  5 mg Oral BID   atorvastatin  80 mg Oral Daily   carvedilol  25 mg Oral BID WC   Chlorhexidine Gluconate Cloth  6 each Topical Daily   Chlorhexidine Gluconate Cloth  6 each Topical Q0600   clopidogrel  75 mg Oral Daily   dapsone  100 mg Oral Daily   dolutegravir-rilpivirine  1 tablet Oral Q lunch   hydrALAZINE  100 mg Oral TID   isosorbide mononitrate  60 mg Oral Daily   lisinopril  10 mg Oral Daily   minoxidil  10 mg Oral BID   mirtazapine  7.5 mg Oral QHS   mupirocin ointment   Topical BID   sevelamer carbonate  2,400 mg Oral TID WC   vancomycin variable dose per unstable renal function (pharmacist dosing)   Does not apply See admin instructions   Continuous Infusions:  ceFEPime (MAXIPIME) IV       LOS: 3 days      Huey Bienenstock, MD [facility]   To contact the attending provider between 7A-7P or the covering provider during after hours 7P-7A, please log into the web site [email or web address] and access using universal [place] password for that web site. If you do not have the password, please call the hospital operator.  03/05/2023, 3:55 PM

## 2023-03-05 NOTE — Progress Notes (Signed)
Patient refused lab draws on two separate attempts.

## 2023-03-05 NOTE — Progress Notes (Signed)
Rounding Note    Patient Name: [name] Date of Encounter: 03/05/2023  [facility] Cardiologist: Chrystie Nose, MD    Subjective   No acute events.  Has constant chest pain that is somewhat improved with burping.  Per ID who reviewed [facility] records, no aortic root abscess on TEE  Inpatient Medications    Scheduled Meds:  amiodarone  200 mg Oral Daily   amitriptyline  10 mg Oral QHS   apixaban  5 mg Oral BID   atorvastatin  80 mg Oral Daily   carvedilol  25 mg Oral BID WC   Chlorhexidine Gluconate Cloth  6 each Topical Daily   Chlorhexidine Gluconate Cloth  6 each Topical Q0600   clopidogrel  75 mg Oral Daily   dapsone  100 mg Oral Daily   dolutegravir-rilpivirine  1 tablet Oral Q lunch   hydrALAZINE  100 mg Oral TID   isosorbide mononitrate  60 mg Oral Daily   lisinopril  10 mg Oral Daily   minoxidil  10 mg Oral BID   mirtazapine  7.5 mg Oral QHS   mupirocin ointment   Topical BID   sevelamer carbonate  2,400 mg Oral TID WC   vancomycin variable dose per unstable renal function (pharmacist dosing)   Does not apply See admin instructions   Continuous Infusions:  ceFEPime (MAXIPIME) IV     PRN Meds: acetaminophen **OR** acetaminophen, albuterol, HYDROmorphone (DILAUDID) injection, nitroGLYCERIN, ondansetron **OR** ondansetron (ZOFRAN) IV, oxyCODONE, senna-docusate, sevelamer carbonate, traMADol, traZODone   Vital Signs    Vitals:   03/04/23 2356 03/05/23 0831 03/05/23 0919 03/05/23 1139  BP: (!) 110/45 (!) 121/53 (!) 143/61 (!) 99/46  Pulse: 78   75  Resp: 17 20 14 19   Temp: 97.7 F (36.5 C)   97.7 F (36.5 C)  TempSrc: Oral   Oral  SpO2:      Weight:      Height:       No intake or output data in the 24 hours ending 03/05/23 1245    03/03/2023    4:40 PM 03/03/2023    1:16 PM 03/03/2023   10:25 AM  Last 3 Weights  Weight (lbs) 138 lb 10.7 oz 143 lb 1.3 oz 140 lb  Weight (kg) 62.9 kg 64.9 kg 63.504 kg      Telemetry    SR -  Personally Reviewed  ECG    None new- Personally Reviewed  Physical Exam   GEN: No acute distress.   HEENT:  MMM, no JVD, no scleral icterus Cardiac: RRR, no murmurs, rubs, or gallops.  Do not appreciate loud murmur Respiratory: Clear to auscultation bilaterally. GI: Soft, nontender, non-distended  MS: No edema; No deformity. Neuro:  Nonfocal  Vasc:  +2 radial pulses; old LUE fistula  Labs    High Sensitivity Troponin:   Recent Labs  Lab 03/01/23 1730 03/01/23 2000 03/04/23 0911  TROPONINIHS 86* 105* 89*     Chemistry Recent Labs  Lab 03/01/23 1730 03/01/23 2000 03/01/23 2250 03/02/23 0415 03/03/23 0409 03/04/23 0910  NA 134* 136  135   < > 134* 133* 137  K 6.9* 6.7*  6.5*   < > 5.5* 5.0 4.8  CL 92* 94*  94*   < > 95* 93* 94*  CO2 24 23  26    < > 20* 28 26  GLUCOSE 106* 104*  108*   < > 77 111* 102*  BUN 60* 63*  62*   < >  64* 42* 47*  CREATININE 8.46* 8.75*  8.76*   < > 8.39* 6.91* 7.60*  CALCIUM 9.2 9.2  9.1   < > 8.4* 8.6* 8.6*  PROT 8.2* 8.3*  --   --  8.2*  --   ALBUMIN 3.3* 3.4*  --  3.1* 3.4*  --   AST 39 46*  --   --  30  --   ALT 24 25  --   --  24  --   ALKPHOS 76 83  --   --  76  --   BILITOT 1.0 0.9  --   --  0.5  --   GFRNONAA 7* 6*  6*   < > 7* 8* 8*  ANIONGAP 18* 19*  15   < > 19* 12 17*   < > = values in this interval not displayed.    Lipids No results for input(s): "CHOL", "TRIG", "HDL", "LABVLDL", "LDLCALC", "CHOLHDL" in the last 168 hours.  Hematology Recent Labs  Lab 03/02/23 0415 03/03/23 0409 03/04/23 0910  WBC 7.2 6.5 6.9  RBC 4.23 4.44 4.45  HGB 11.6* 12.0* 12.3*  HCT 39.1 40.6 39.6  MCV 92.4 91.4 89.0  MCH 27.4 27.0 27.6  MCHC 29.7* 29.6* 31.1  RDW 19.7* 19.2* 18.6*  PLT 228 231 239   Thyroid No results for input(s): "TSH", "FREET4" in the last 168 hours.  BNPNo results for input(s): "BNP", "PROBNP" in the last 168 hours.  DDimer No results for input(s): "DDIMER" in the last 168 hours.   Radiology    IR  Removal Tun Cv Cath W/O FL  Result Date: 03/04/2023 INDICATION: Patient with bacterial endocarditis, dialysis dependent via right IJ tunnel dialysis catheter placed at outside hospital. Request is made for removal for line holiday. EXAM: REMOVAL TUNNELED CENTRAL VENOUS CATHETER MEDICATIONS: 5 mL 1% lidocaine ANESTHESIA/SEDATION: None FLUOROSCOPY TIME:  None COMPLICATIONS: None immediate. PROCEDURE: Informed written consent was obtained from the patient after a thorough discussion of the procedural risks, benefits and alternatives. All questions were addressed. Maximal Sterile Barrier Technique was utilized including caps, mask, sterile gowns, sterile gloves, sterile drape, hand hygiene and skin antiseptic. A timeout was performed prior to the initiation of the procedure. The patient's right chest and catheter was prepped and draped in a normal sterile fashion. Heparin was removed from both ports of catheter. 1% lidocaine was used for local anesthesia. Using gentle blunt dissection the cuff of the catheter was exposed and the catheter was removed in it's entirety. Pressure was held till hemostasis was obtained. A sterile dressing was applied. The patient tolerated the procedure well with no immediate complications. IMPRESSION: Successful catheter removal as described above. Read by: Loyce Dys PA-C Electronically Signed   By: Simonne Come M.D.   On: 03/04/2023 11:10    Cardiac Studies   Echo 02/2023 ([facility])   MILD LV DYSFUNCTION (See above) WITH MILD LVH    MILD RV SYSTOLIC DYSFUNCTION (See above)    VALVULAR REGURGITATION: SEVERE AR, MILD MR, TRIVIAL PR, MILD TR    VALVULAR STENOSIS: MODERATE AS    --SEVERE DIFFUSE THICKENING OF AORTIC VALVE WITH SMALL, MOBILE TARGET SEEN    ON RIGHT CORONARY CUSP; ECHODENSE TISSUE AROUND AORTIC VALVE IS    CONCERNING FOR AORTIC ABSCESS.    --ASSOCIATED SEVERE AR    --SMALL OSCILATING MASS ON MV, DEGENERATIVE CHANGES VS. VEGETATION   Patient Profile     [name] is a 62 y.o. male with a hx of HIV,  hypertension, ESRD on HD, paroxysmal atrial fibrillation, CAD s/p 4v CABG with recent s/p stent SVG-rPDA 12/2022 and aortic valve endocarditis who is being seen 03/04/2023 for the evaluation of chest pain/abnormal EKG at the request of Dr. Randol Kern.  Assessment & Plan       Chest pain CAD s/p 4v CABG' 2022 (LIMA-LAD, RIMA T graft-RI/OM1, SVG-PDA) Recent PCI/DES to SVG-rPDA 12/2022 Mildly elevated troponins that are decreasing.  Patient chest pain syndrome very atypical.  Discussed with hospital medicine.  Given medical noncompliance, will defer coronary angiography and continue with medical therapy with Eliquis, plavix, coreg, imdur, lisinopril   Aortic Valve Endocarditis HD catheter infection Per ID note, no aortic root abscess.     ESRD on HD Hyperkalemia -- Nephrology following, on Tuesday Thursday Saturday schedule -- New HD line planned for tomorrow   HTN -- Fluctuating BP; for now continue Coreg 25 mg twice daily, hydralazine 100 mg 3 times daily, Imdur 60 mg daily, lisinopril 10 mg daily; may need to adjust dose   Paroxysmal Afib -- In sinus rhythm -- Continue amiodarone 200 mg daily, Eliquis 5 mg twice daily   HIV Depression [facility] will sign off.  Will arrange cardiology follow up.   For questions or updates, please contact  [facility] Please consult [email or web address] for contact info under        Signed, Orbie Pyo, MD  03/05/2023, 12:45 PM

## 2023-03-05 NOTE — Progress Notes (Signed)
Advised by pharmacy that pt will require iv abx with HD at d/c. Spoke to pt via phone who confirms that he plans to return home at d/c and will resume at his home HD clinic. Contacted [facility] and spoke to [place], Tax adviser. Tresa Endo confirms that clinic has iv vanc and iv cefepime and can provide to pt at d/c. Pt's H and P, renal consult note, today's renal note, and pharmacy note for today faxed to clinic for continuation of care. Will provide update to clinic on pt's d/c date once known. Will assist as needed.   Olivia Canter Renal Navigator [phone]

## 2023-03-05 NOTE — Progress Notes (Signed)
[facility] Progress Note   Subjective:  Seen in room - feels ok, no new symptoms. Plan remains for new [facility] placement tomorrow followed by dialysis. Unable to get labs this AM due to arm swelling around IV, did take dose of Lokelma this AM.  Objective Vitals:   03/04/23 1800 03/04/23 2356 03/05/23 0831 03/05/23 0919  BP:  (!) 110/45 (!) 121/53 (!) 143/61  Pulse:  78    Resp: Temp:  97.7 F (36.5 C)    TempSrc:  Oral    SpO2:      Weight:      Height:       Physical Exam General: Well appearing man, NAD. Room air. Heart: RRR; no murmur Lungs: CTAB; no rales Abdomen: soft Extremities: no LE edema Dialysis Access: none  Additional Objective Labs: Basic Metabolic Panel: Recent Labs  Lab 03/02/23 0415 03/03/23 0409 03/04/23 0910  NA 134* 133* 137  K 5.5* 5.0 4.8  CL 95* 93* 94*  CO2 20* 28 26  GLUCOSE 77 111* 102*  BUN 64* 42* 47*  CREATININE 8.39* 6.91* 7.60*  CALCIUM 8.4* 8.6* 8.6*  PHOS 5.4*  --   --    Liver Function Tests: Recent Labs  Lab 03/01/23 1730 03/01/23 2000 03/02/23 0415 03/03/23 0409  AST 39 46*  --  30  ALT 24 25  --  24  ALKPHOS 76 83  --  76  BILITOT 1.0 0.9  --  0.5  PROT 8.2* 8.3*  --  8.2*  ALBUMIN 3.3* 3.4* 3.1* 3.4*   CBC: Recent Labs  Lab 03/01/23 1730 03/02/23 0415 03/03/23 0409 03/04/23 0910  WBC 6.7 7.2 6.5 6.9  NEUTROABS 3.3  --   --   --   HGB 12.5* 11.6* 12.0* 12.3*  HCT 40.4 39.1 40.6 39.6  MCV 91.2 92.4 91.4 89.0  PLT 219 228 231 239   Studies/Results: IR Removal Tun Cv Cath W/O FL  Result Date: 03/04/2023 INDICATION: Patient with bacterial endocarditis, dialysis dependent via right IJ tunnel dialysis catheter placed at outside hospital. Request is made for removal for line holiday. EXAM: REMOVAL TUNNELED CENTRAL VENOUS CATHETER MEDICATIONS: 5 mL 1% lidocaine ANESTHESIA/SEDATION: None FLUOROSCOPY TIME:  None COMPLICATIONS: None immediate. PROCEDURE: Informed written consent was  obtained from the patient after a thorough discussion of the procedural risks, benefits and alternatives. All questions were addressed. Maximal Sterile Barrier Technique was utilized including caps, mask, sterile gowns, sterile gloves, sterile drape, hand hygiene and skin antiseptic. A timeout was performed prior to the initiation of the procedure. The patient's right chest and catheter was prepped and draped in a normal sterile fashion. Heparin was removed from both ports of catheter. 1% lidocaine was used for local anesthesia. Using gentle blunt dissection the cuff of the catheter was exposed and the catheter was removed in it's entirety. Pressure was held till hemostasis was obtained. A sterile dressing was applied. The patient tolerated the procedure well with no immediate complications. IMPRESSION: Successful catheter removal as described above. Read by: Loyce Dys PA-C Electronically Signed   By: Simonne Come M.D.   On: 03/04/2023 11:10    Medications:  ceFEPime (MAXIPIME) IV      amiodarone  200 mg Oral Daily   amitriptyline  10 mg Oral QHS   apixaban  5 mg Oral BID   atorvastatin  80 mg Oral Daily   carvedilol  25 mg Oral BID WC   Chlorhexidine Gluconate Cloth  6 each  Topical Daily   Chlorhexidine Gluconate Cloth  6 each Topical Q0600   clopidogrel  75 mg Oral Daily   dapsone  100 mg Oral Daily   dolutegravir-rilpivirine  1 tablet Oral Q lunch   hydrALAZINE  100 mg Oral TID   isosorbide mononitrate  60 mg Oral Daily   lisinopril  10 mg Oral Daily   minoxidil  10 mg Oral BID   mirtazapine  7.5 mg Oral QHS   mupirocin ointment   Topical BID   sevelamer carbonate  2,400 mg Oral TID WC   vancomycin variable dose per unstable renal function (pharmacist dosing)   Does not apply See admin instructions    Dialysis Orders: TTS [place]/ [place] [facility] 4h  420/ 800   68kg   2/2/ bath  Heparin ?  RIJ TDC   Assessment/Plan: AoV endocarditis - vegetation on tip of TDC: Blood Cx  negative from early April at [facility]. TDC removed 4/17, plan is to replace [facility] Friday AM followed by HD. On Vancomycin + Cefepime with HD x 6 week course. ESRD: Usual TTS schedule - HD yesterday (4/16) - next tentatively Friday (4/19) after new TDC placement. Lokelma 10g on 4/17, 4/18 to hold him over.  HTN/volume: BP controlled, on multiple HTN meds. Close to euvolemic, well below prior EDW. 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  Secondary HPTH: Ca/Phos ok - continue sevelamer as binder. HIV: Continue home meds Hx perirectal boil  Ozzie Hoyle, PA-C 03/05/2023, 11:12 AM  [employer]

## 2023-03-05 NOTE — Care Management Important Message (Signed)
Important Message  Patient Details  Name: [name] MRN: [id number] Date of Birth: Nov 06, 1961   Medicare Important Message Given:  Yes     [name] [name] 03/05/2023, 2:35 PM

## 2023-03-05 NOTE — TOC Benefit Eligibility Note (Signed)
Patient Product/process development scientist completed.    The patient is currently admitted and upon discharge could be taking Juluca 50-25 mg tablets.  The current 30 day co-pay is $0.00.   The patient is insured through [employer]   This test claim was processed through [facility]- copay amounts may vary at other pharmacies due to pharmacy/plan contracts, or as the patient moves through the different stages of their insurance plan.  Roland Earl, CPHT Pharmacy Patient Advocate Specialist [facility] Patient Advocate Team Direct Number: [phone]  Fax: [phone]

## 2023-03-05 NOTE — Progress Notes (Signed)
Pharmacy Antibiotic Note  [name] is a 62 y.o. male admitted on 03/02/2023 with culture negative endocarditis.  Pharmacy has been consulted for Cefepime + Vancomycin dosing. -WBC= 6.9, afeb -next HD planned on 4/19   Plan: - Cefepime 1gm IV q24h -Will check a vancomycin level before HD on 4/19   Height:  (165.1 cm) Weight: 62.9 kg (138 lb 10.7 oz) IBW/kg (Calculated) : 61.5  Temp (24hrs), Avg:97.7 F (36.5 C), Min:97.7 F (36.5 C), Max:97.8 F (36.6 C)  Recent Labs  Lab 03/01/23 1730 03/01/23 2000 03/01/23 2250 03/02/23 0415 03/03/23 0409 03/04/23 0909 03/04/23 0910  WBC 6.7  --   --  7.2 6.5  --  6.9  CREATININE 8.46* 8.75*  8.76* 8.46* 8.39* 6.91*  --  7.60*  VANCORANDOM  --  16  --   --   --  39  --      Estimated Creatinine Clearance: 8.9 mL/min (A) (by C-G formula based on SCr of 7.6 mg/dL (H)).    Allergies  Allergen Reactions   Penicillins Anaphylaxis    Has patient had a PCN reaction causing immediate rash, facial/tongue/throat swelling, SOB or lightheadedness with hypotension: Unknown Has patient had a PCN reaction causing severe rash involving mucus membranes or skin necrosis: Unknown Has patient had a PCN reaction that required hospitalization: Unknown Has patient had a PCN reaction occurring within the last 10 years: No If all of the above answers are "NO", then may proceed with Cephalosporin use.   Pepcid [Famotidine] Other (See Comments)   Prilosec [Omeprazole] Other (See Comments)    Antimicrobials this admission: Vanc + CTX PTA at [facility] 4/16 >> Rocephin 4/16 >> 4/17 Cefepime 4/17 x 1  Dose adjustments this admission: 4/14 VR on admit after being at [facility] = 16 mcg/ml 4/17 VR 39 mcg/ml (from infiltrated arm so falsely high but still likely >15 mcg/ml)  Microbiology results: 4/14 BCx >> ngtd   Thank you for allowing pharmacy to be a part of this patient's care.  Harland German, PharmD Clinical Pharmacist **Pharmacist phone  directory can now be found on [email or web address] ([email or web address]).  Listed under [facility].

## 2023-03-05 NOTE — Progress Notes (Signed)
Subjective:  No new complaints   Antibiotics:  Anti-infectives (From admission, onward)    Start     Dose/Rate Route Frequency Ordered Stop   03/06/23 1200  ceFEPIme (MAXIPIME) 2 g in sodium chloride 0.9 % 100 mL IVPB  Status:  Discontinued        2 g 200 mL/hr over 30 Minutes Intravenous Every M-W-F (Hemodialysis) 03/05/23 0802 03/05/23 0939   03/05/23 2200  ceFEPIme (MAXIPIME) 1 g in sodium chloride 0.9 % 100 mL IVPB        1 g 200 mL/hr over 30 Minutes Intravenous Every 24 hours 03/05/23 0939     03/04/23 1200  vancomycin (VANCOCIN) IVPB 1000 mg/200 mL premix  Status:  Discontinued        1,000 mg 200 mL/hr over 60 Minutes Intravenous On call 03/04/23 1110 03/04/23 1114   03/04/23 1000  ceFEPIme (MAXIPIME) 2 g in sodium chloride 0.9 % 100 mL IVPB        2 g 200 mL/hr over 30 Minutes Intravenous  Once 03/04/23 0904 03/04/23 1106   03/03/23 2215  linezolid (ZYVOX) tablet 600 mg        600 mg Oral  Once 03/03/23 2125 03/03/23 2233   03/03/23 1700  vancomycin (VANCOREADY) IVPB 1250 mg/250 mL        1,250 mg 166.7 mL/hr over 90 Minutes Intravenous  Once 03/03/23 1506 03/03/23 1911   03/03/23 1600  vancomycin (VANCOREADY) IVPB 750 mg/150 mL  Status:  Discontinued        750 mg 150 mL/hr over 60 Minutes Intravenous  Once 03/03/23 1447 03/03/23 1506   03/03/23 1230  vancomycin (VANCOREADY) IVPB 750 mg/150 mL        750 mg 150 mL/hr over 60 Minutes Intravenous  Once 03/03/23 1133 03/03/23 1315   03/03/23 1200  dolutegravir-rilpivirine (JULUCA) 50-25 MG per tablet 1 tablet        1 tablet Oral Daily with lunch 03/03/23 1113     03/03/23 1200  dapsone tablet 100 mg        100 mg Oral Daily 03/03/23 1113     03/03/23 0930  vancomycin (VANCOREADY) IVPB 750 mg/150 mL  Status:  Discontinued        750 mg 150 mL/hr over 60 Minutes Intravenous  Once 03/03/23 0922 03/03/23 1133   03/03/23 0921  vancomycin variable dose per unstable renal function (pharmacist dosing)          Does not apply See admin instructions 03/03/23 0922     03/03/23 0915  cefTRIAXone (ROCEPHIN) 2 g in sodium chloride 0.9 % 100 mL IVPB  Status:  Discontinued        2 g 200 mL/hr over 30 Minutes Intravenous Every 24 hours 03/03/23 0909 03/04/23 0904       Medications: Scheduled Meds:  amiodarone  200 mg Oral Daily   amitriptyline  10 mg Oral QHS   apixaban  5 mg Oral BID   atorvastatin  80 mg Oral Daily   carvedilol  25 mg Oral BID WC   Chlorhexidine Gluconate Cloth  6 each Topical Daily   Chlorhexidine Gluconate Cloth  6 each Topical Q0600   clopidogrel  75 mg Oral Daily   dapsone  100 mg Oral Daily   dolutegravir-rilpivirine  1 tablet Oral Q lunch   hydrALAZINE  100 mg Oral TID   isosorbide mononitrate  60 mg Oral Daily   lisinopril  10 mg Oral Daily  minoxidil  10 mg Oral BID   mirtazapine  7.5 mg Oral QHS   mupirocin ointment   Topical BID   sevelamer carbonate  2,400 mg Oral TID WC   vancomycin variable dose per unstable renal function (pharmacist dosing)   Does not apply See admin instructions   Continuous Infusions:  ceFEPime (MAXIPIME) IV     PRN Meds:.acetaminophen **OR** acetaminophen, albuterol, HYDROmorphone (DILAUDID) injection, nitroGLYCERIN, ondansetron **OR** ondansetron (ZOFRAN) IV, oxyCODONE, senna-docusate, sevelamer carbonate, traMADol, traZODone    Objective: Weight change:  No intake or output data in the 24 hours ending 03/05/23 1226  Blood pressure (!) 99/46, pulse 75, temperature 97.7 F (36.5 C), temperature source Oral, resp. rate 19, height  (1.651 m), weight 62.9 kg, SpO2 98 %. Temp:  [97.7 F (36.5 C)-97.8 F (36.6 C)] 97.7 F (36.5 C) (04/18 1139) Pulse Rate:  [67-87] 75 (04/18 1139) Resp:  [14-21] 19 (04/18 1139) BP: (99-143)/(45-61) 99/46 (04/18 1139) SpO2:  [65 %-100 %] 98 % (04/17 1621)  Physical Exam: Physical Exam Constitutional:      Appearance: Normal appearance.  Eyes:     General:        Right eye: No discharge.         Left eye: No discharge.     Extraocular Movements: Extraocular movements intact.     Conjunctiva/sclera: Conjunctivae normal.  Cardiovascular:     Rate and Rhythm: Normal rate and regular rhythm.  Pulmonary:     Effort: Pulmonary effort is normal. No respiratory distress.     Breath sounds: No wheezing.  Neurological:     General: No focal deficit present.     Mental Status: He is alert and oriented to person, place, and time.  Psychiatric:        Mood and Affect: Mood normal.        Behavior: Behavior normal.        Thought Content: Thought content normal.        Judgment: Judgment normal.      CBC:    BMET Recent Labs    03/03/23 0409 03/04/23 0910  NA 133* 137  K 5.0 4.8  CL 93* 94*  CO2 28 26  GLUCOSE 111* 102*  BUN 42* 47*  CREATININE 6.91* 7.60*  CALCIUM 8.6* 8.6*      Liver Panel  Recent Labs    03/03/23 0409  PROT 8.2*  ALBUMIN 3.4*  AST 30  ALT 24  ALKPHOS 76  BILITOT 0.5        Sedimentation Rate No results for input(s): "ESRSEDRATE" in the last 72 hours. C-Reactive Protein No results for input(s): "CRP" in the last 72 hours.  Micro Results: Recent Results (from the past 720 hour(s))  Blood culture (routine x 2)     Status: None (Preliminary result)   Collection Time: 03/01/23  8:00 PM   Specimen: BLOOD  Result Value Ref Range Status   Specimen Description BLOOD LEFT ANTECUBITAL  Final   Special Requests   Final    BOTTLES DRAWN AEROBIC AND ANAEROBIC Blood Culture adequate volume   Culture   Final    NO GROWTH 4 DAYS Performed at [facility], [address], [place], [place] [address]    Report Status PENDING  Incomplete  Blood culture (routine x 2)     Status: None (Preliminary result)   Collection Time: 03/01/23  8:20 PM   Specimen: BLOOD RIGHT HAND  Result Value Ref Range Status   Specimen Description BLOOD  RIGHT HAND  Final   Special Requests   Final    BOTTLES DRAWN AEROBIC AND ANAEROBIC Blood Culture  adequate volume   Culture   Final    NO GROWTH 4 DAYS Performed at [facility], [address] [place], [place] [address]    Report Status PENDING  Incomplete    Studies/Results: IR Removal Tun Cv Cath W/O FL  Result Date: 03/04/2023 INDICATION: Patient with bacterial endocarditis, dialysis dependent via right IJ tunnel dialysis catheter placed at outside hospital. Request is made for removal for line holiday. EXAM: REMOVAL TUNNELED CENTRAL VENOUS CATHETER MEDICATIONS: 5 mL 1% lidocaine ANESTHESIA/SEDATION: None FLUOROSCOPY TIME:  None COMPLICATIONS: None immediate. PROCEDURE: Informed written consent was obtained from the patient after a thorough discussion of the procedural risks, benefits and alternatives. All questions were addressed. Maximal Sterile Barrier Technique was utilized including caps, mask, sterile gowns, sterile gloves, sterile drape, hand hygiene and skin antiseptic. A timeout was performed prior to the initiation of the procedure. The patient's right chest and catheter was prepped and draped in a normal sterile fashion. Heparin was removed from both ports of catheter. 1% lidocaine was used for local anesthesia. Using gentle blunt dissection the cuff of the catheter was exposed and the catheter was removed in it's entirety. Pressure was held till hemostasis was obtained. A sterile dressing was applied. The patient tolerated the procedure well with no immediate complications. IMPRESSION: Successful catheter removal as described above. Read by: Loyce Dys PA-C Electronically Signed   By: Simonne Come M.D.   On: 03/04/2023 11:10      Assessment/Plan:  INTERVAL HISTORY:  HD catheter out  Karius test at [facility] was negative   Principal Problem:   Aortic valve endocarditis Active Problems:   Human immunodeficiency virus (HIV) disease (HCC)   Hypertension   CHF (congestive heart failure)   ESRD on hemodialysis   Paroxysmal atrial fibrillation   Depression    Coronary artery disease    [name] is a 62 y.o. male with multiple medical problems including HIV/AIDS with intermittent adherence most recently virally suppressed with viral load less than 50 on TIVICAY and [place]-->Juluca here, who was admitted to [facility] for workup of chest pain and found to have aortic and possible mitral valve vegetations on 2D echocardiogram.  Also been concern about an aortic root abscess.  Transfer to [facility] he underwent transesophageal echocardiogram that showed aortic valve endocarditis with moderate AR but no aortic root abscess.  NOTE I HAVE GONE THROUGH RECORDS AT [facility] AGAIN AND THE ONLY TIME AORTIC ROOT ABSCESS WAS MENTIONED AS BEING OF CONCERN WAS ON TTE--AND THIS WAS NOT FOUND ON TEE NOR ON CT CHEST    Blood cultures prior to antibiotics in the fourth sixth and seventh did not grow any organisms FINAL  Patient was started on vancomycin and ceftriaxone.  Karius test was also performed and is negative    He has now been admitted to [facility]  HD catheter removed  I did order another set of blood cultures post line removal today  He will have a line "holiday" of 2 days and then new HD catheter  Switch him  over to cefepime with vancomycin for easier dosing around hemodialysis   I would give him 6 weeks of treatment with vancomycin and cefepime in total    ESRD On HD  HIV/AIDS: Continue JULUCA with food and avoidance of H2 blockers and PPIs and continue dapsone  GERD:  he was asking for something for heartburn but H2 blockers, PPI are contraindicated for him with RPV  He can try mustard for this OR if he needs H2 blocker or PPI he will need tohave his ARV regimen changed--something he has not wanted to do  I would make sure he leaves the hospital with 30 day supply of Juluca  He is wanting to follouwp with Dr. Ninetta Lights in [facility] clinic for his HIV related care  IF he should need SPAP (he may not) he  would have to come to a [employer] clinic such as [facility]  I have personally spent 52 minutes involved in face-to-face and non-face-to-face activities for this patient on the day of the visit. Professional time spent includes the following activities: Preparing to see the patient (review of tests), Obtaining and/or reviewing separately obtained history (admission/discharge record), Performing a medically appropriate examination and/or evaluation , Ordering medications/tests/procedures, referring and communicating with other health care professionals, Documenting clinical information in the EMR, Independently interpreting results (not separately reported), Communicating results to the patient/family/caregiver, Counseling and educating the patient/family/caregiver and Care coordination (not separately reported).   I will sign off for now.  Please call with further questions.   LOS: 3 days   Acey Lav 03/05/2023, 12:26 PM

## 2023-03-06 ENCOUNTER — Inpatient Hospital Stay (HOSPITAL_COMMUNITY): Payer: 59

## 2023-03-06 ENCOUNTER — Other Ambulatory Visit (HOSPITAL_COMMUNITY): Payer: Self-pay

## 2023-03-06 DIAGNOSIS — N186 End stage renal disease: Secondary | ICD-10-CM | POA: Diagnosis not present

## 2023-03-06 DIAGNOSIS — Z992 Dependence on renal dialysis: Secondary | ICD-10-CM | POA: Diagnosis not present

## 2023-03-06 DIAGNOSIS — I358 Other nonrheumatic aortic valve disorders: Secondary | ICD-10-CM | POA: Diagnosis not present

## 2023-03-06 DIAGNOSIS — B20 Human immunodeficiency virus [HIV] disease: Secondary | ICD-10-CM | POA: Diagnosis not present

## 2023-03-06 DIAGNOSIS — I5021 Acute systolic (congestive) heart failure: Secondary | ICD-10-CM | POA: Diagnosis not present

## 2023-03-06 DIAGNOSIS — I214 Non-ST elevation (NSTEMI) myocardial infarction: Secondary | ICD-10-CM | POA: Diagnosis not present

## 2023-03-06 DIAGNOSIS — I330 Acute and subacute infective endocarditis: Secondary | ICD-10-CM | POA: Diagnosis not present

## 2023-03-06 LAB — RENAL FUNCTION PANEL
Albumin: 3.1 g/dL — ABNORMAL LOW (ref 3.5–5.0)
Anion gap: 20 — ABNORMAL HIGH (ref 5–15)
BUN: 72 mg/dL — ABNORMAL HIGH (ref 8–23)
CO2: 22 mmol/L (ref 22–32)
Calcium: 8.7 mg/dL — ABNORMAL LOW (ref 8.9–10.3)
Chloride: 92 mmol/L — ABNORMAL LOW (ref 98–111)
Creatinine, Ser: 11.56 mg/dL — ABNORMAL HIGH (ref 0.61–1.24)
GFR, Estimated: 5 mL/min — ABNORMAL LOW (ref >60)
Glucose, Bld: 90 mg/dL (ref 70–99)
Phosphorus: 7.5 mg/dL — ABNORMAL HIGH (ref 2.5–4.6)
Potassium: 6.1 mmol/L — ABNORMAL HIGH (ref 3.5–5.1)
Sodium: 134 mmol/L — ABNORMAL LOW (ref 135–145)

## 2023-03-06 LAB — CULTURE, BLOOD (ROUTINE X 2): Culture: NO GROWTH

## 2023-03-06 LAB — TROPONIN I (HIGH SENSITIVITY)
Troponin I (High Sensitivity): 89 ng/L — ABNORMAL HIGH (ref <18)
Troponin I (High Sensitivity): 108 ng/L (ref <18)

## 2023-03-06 LAB — CBC
HCT: 37.5 % — ABNORMAL LOW (ref 39.0–52.0)
Hemoglobin: 11.4 g/dL — ABNORMAL LOW (ref 13.0–17.0)
MCH: 27.4 pg (ref 26.0–34.0)
MCHC: 30.4 g/dL (ref 30.0–36.0)
MCV: 90.1 fL (ref 80.0–100.0)
Platelets: 233 10*3/uL (ref 150–400)
RBC: 4.16 MIL/uL — ABNORMAL LOW (ref 4.22–5.81)
RDW: 17.6 % — ABNORMAL HIGH (ref 11.5–15.5)
WBC: 7.3 10*3/uL (ref 4.0–10.5)
nRBC: 0 % (ref 0.0–0.2)

## 2023-03-06 LAB — TYPE AND SCREEN
ABO/RH(D): O POS
Antibody Screen: NEGATIVE

## 2023-03-06 LAB — POTASSIUM: Potassium: 6.2 mmol/L — ABNORMAL HIGH (ref 3.5–5.1)

## 2023-03-06 LAB — VANCOMYCIN, RANDOM: Vancomycin Rm: 28 ug/mL

## 2023-03-06 MED ORDER — SODIUM ZIRCONIUM CYCLOSILICATE 10 G PO PACK
10.0000 g | PACK | ORAL | Status: AC
  Administered 2023-03-06: 10 g via ORAL
  Filled 2023-03-06: qty 1

## 2023-03-06 MED ORDER — SODIUM CHLORIDE 0.9 % IV SOLN: 20.0000 ug | Freq: Once | INTRAVENOUS | Status: DC

## 2023-03-06 MED ORDER — HYDROMORPHONE HCL 1 MG/ML IJ SOLN
INTRAMUSCULAR | Status: AC
  Filled 2023-03-06: qty 1

## 2023-03-06 MED ORDER — SODIUM CHLORIDE 0.9 % IV SOLN
0.3000 ug/kg | Freq: Once | INTRAVENOUS | Status: AC
  Administered 2023-03-06: 18.8 ug via INTRAVENOUS
  Filled 2023-03-06: qty 4.7

## 2023-03-06 NOTE — Progress Notes (Signed)
Interventional Radiology Brief Note: Patient refused catheter placement in IR today.  Once he was brought to IR and ready to transfer to procedural table he began to complain of chest and abdominal pain.  Despite reassurance, he was unwilling to cooperate.  Patient was returned to the unit for reattempt once stable and cooperative.    Update 1712: IR schedule unable to accommodate patient for tunneled dialysis catheter placement today.  Orders updated for NPO p MN tomorrow for procedure as schedule allows.   Loyce Dys, MS RD PA-C

## 2023-03-06 NOTE — Sedation Documentation (Addendum)
Dr. Milford Cage at beside with patient at this time. Patient stating that he is still having CP at this time and "it's like my last heart attack".  Dr. Milford Cage inquiring if patient wishes to continue with procedure at this time and patient states that he does not wish to proceed with procedure at this time due to "chest pain".  Vitals still do not suggest CP and MD states to administer 0.5 mg Dilaudid (per pt 3hr regimen from floor) and return to floor.  Patient becoming louder and not listening to staff at this time and advised that staff is trying to assist with pain medication administration but would needs him to return to bed for safety.  Patient continues to yell and scream at this time. MD advised to administer pain medication, place quick clot on bleeding site and return patient to floor.  Verbal acknowledged and will carry out.

## 2023-03-06 NOTE — Sedation Documentation (Signed)
Quick clock placed on patient's right upper chest with clean dry bandage applied.

## 2023-03-06 NOTE — Sedation Documentation (Addendum)
Patient complains of mid-sternal CP and states "it feels like my last heart attack".  NP made aware and same spoke with Dr. Rica Records.  Initially advised to return patient to floor, however, upon further conversation with NP and floor MD, patient was returned to IR department.  Per NP after speaking with MD, patient has had "the same CP for the last 3 days and has been given nitro with no relief and also receiving pain medication for same".  Patient advised by NP that he has the option to not continue with the procedure at this time and patient states "well I have to have it so I'll do it and just don't let me die down here".  Patient advised by NP that he has the option to cease with procedure and patient states "why would I do that if I have to have it?!" with a raised voice.  Vitals suggest that patient is stable at this time.  Patty, CN advised that rhythm on monitor, when compared to previous EKG, does not suggest any change at this time.

## 2023-03-06 NOTE — Progress Notes (Signed)
[name] NOTE    LEDFORD GOODSON  WUJ:[id number] DOB: 02-06-1961 DOA: 03/02/2023 PCP: [name], [name]    Chief Complaint  Patient presents with   Chest Pain    Brief Narrative:   [name] was admitted to the hospital with the working diagnosis of aortic valve endocarditis.    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 CT surgery determined patient was not a surgical candidate.  Patient left the hospital AMA, 04/11, then was re-hospitalized 04/13 but left again AMA on the 04/14.  Admitted to [facility] on 04/15 and left AMA later the same day.  At home with chest pain and presented back to the hospital.  On his initial physical examination his blood pressure was 156.73, HR 87, RR 16 and 02 saturation 100%, lungs with no wheezing or rales, heart with S1 and S2 present and rhythmic, abdomen with no distention and no lower extremity edema.    Na 134, K 5,5 Cl 95, bicarbonate 20, glucose 77 bun 64 cr 8.39 Wbc 7.7 hgb 11,6 plt 228    EKG 84 bpm, left axis deviation, left anterior fascicular block, qtc 503, sinus rhythm with 1st degree AV block, ST depression V5 and V6, no significant T wave changes. Positive LVH.   Assessment & Plan:   Principal Problem:   Aortic valve endocarditis Active Problems:   ESRD on hemodialysis   CHF (congestive heart failure)   Hypertension   Human immunodeficiency virus (HIV) disease (HCC)   Coronary artery disease   Paroxysmal atrial fibrillation   Depression   Aortic valve endocarditis Vegetation on tip of TDC  - ID input greatly appreciated, continue antibiotic therapy with IV vancomycin and cefepime. - TDC continued today, plan for line holiday. -No aortic root abscess.  EEG done at [facility] once  reviewed by her ID -[facility] discontinued yesterday 4/17, plan for [facility] today, has been postponed given chest pain, while patient was downstairs he was complaining of significant chest pain, return to the floor for further evaluation, ,now patient's chest pain-free, and his cardiac workup is reassuring, troponins are stable, EKG nonacute, cardiology input as well greatly appreciated, he is low risk for [facility], so plan for [facility] later today.  ESRD on hemodialysis Hyperkalemia. Hyponatremia -renal input  greatly appreciated, continue with HD, currently with line holiday, plan for [facility] this afternoon.   CHF (congestive heart failure) No clinical signs of decompensation. Volume management with HD   Hypertension Continue blood pressure control with carvedilol, isosorbide, hydralazine and lisinopril. Patient on minoxidil 7,5 mg daily.    Human immunodeficiency virus (HIV) disease (HCC) Continue antiretroviral therapy. Management per ID   Coronary artery disease Chest pain -Presents with intermittent chest pain, with some atypical features as it is improved by burping, troponins at baseline, mildly elevated in the setting of ESRD, non-ACS pattern -No indication for cardiac cath currently, continue with medical therapy including Eliquis, Plavix, Coreg, Imdur and lisinopril, aspirin has been discontinued as patient on Eliquis   Paroxysmal atrial fibrillation Continue anticoagulation with apixaban, Rate control with carvedilol and amiodarone.    Depression Continue with amitriptyline and mirtazapine.       Patient had oozing from [facility] removal site, his hemoglobin has been stable, he received 1 dose of desmopressin, will hold  Eliquis today   DVT prophylaxis: Eliquis Code Status: (Full) Family Communication: NOne at bedside Disposition:   Status is: Inpatient    Consultants:  Renal ID cardiology   Subjective:  Patient with complaints of chest pain earlier today, currently chest  pain-free  Objective: Vitals:   03/06/23 1007 03/06/23 1050 03/06/23 1107 03/06/23 1147  BP: (!) 131/47 (!) 122/53 (!) 166/94 (!) 141/56  Pulse:  77 92 86  Resp: 14 17 20    Temp:      TempSrc:      SpO2:  95%  97%  Weight:      Height:        Intake/Output Summary (Last 24 hours) at 03/06/2023 1342 Last data filed at 03/06/2023 0600 Gross per 24 hour  Intake 600.11 ml  Output --  Net 600.11 ml    Filed Weights   03/03/23 1025 03/03/23 1316 03/03/23 1640  Weight: 63.5 kg 64.9 kg 62.9 kg    Examination:  Awake Alert, Oriented X 3, No new F.N deficits, Normal affect, currently appears to be comfortable, no apparent distress, he was reassessed multiple times during the day, he was earlier today and discomfort due to chest pain) Symmetrical Chest wall movement, Good air movement bilaterally, CTAB RRR,No Gallops,Rubs or new Murmurs, No Parasternal Heave +ve B.Sounds, Abd Soft, No tenderness, No rebound - guarding or rigidity. No Cyanosis, Clubbing or edema, No new Rash or bruise        Data Reviewed: I have personally reviewed following labs and imaging studies  CBC: Recent Labs  Lab 03/01/23 1730 03/02/23 0415 03/03/23 0409 03/04/23 0910 03/06/23 0750  WBC 6.7 7.2 6.5 6.9 7.3  NEUTROABS 3.3  --   --   --   --   HGB 12.5* 11.6* 12.0* 12.3* 11.4*  HCT 40.4 39.1 40.6 39.6 37.5*  MCV 91.2 92.4 91.4 89.0 90.1  PLT 219 228 231 239 233    Basic Metabolic Panel: Recent Labs  Lab 03/01/23 2250 03/02/23 0415 03/03/23 0409 03/04/23 0910 03/06/23 0750  NA 134* 134* 133* 137 134*  K 6.3* 5.5* 5.0 4.8 6.1*  CL 96* 95* 93* 94* 92*  CO2 23 20* 28 26 22   GLUCOSE 86 77 111* 102* 90  BUN 63* 64* 42* 47* 72*  CREATININE 8.46* 8.39* 6.91* 7.60* 11.56*  CALCIUM 9.6 8.4* 8.6* 8.6* 8.7*  PHOS  --  5.4*  --   --  7.5*    GFR: Estimated Creatinine Clearance: 5.8 mL/min (A) (by C-G formula based on SCr of 11.56 mg/dL (H)).  Liver Function Tests: Recent Labs  Lab  03/01/23 1730 03/01/23 2000 03/02/23 0415 03/03/23 0409 03/06/23 0750  AST 39 46*  --  30  --   ALT 24 25  --  24  --   ALKPHOS 76 83  --  76  --   BILITOT 1.0 0.9  --  0.5  --   PROT 8.2* 8.3*  --  8.2*  --   ALBUMIN 3.3* 3.4* 3.1* 3.4* 3.1*    CBG: No results for input(s): "GLUCAP" in the last 168 hours.   Recent Results (from the past 240 hour(s))  Blood culture (routine x 2)     Status: None   Collection Time: 03/01/23  8:00 PM   Specimen: BLOOD  Result Value Ref Range Status   Specimen Description BLOOD LEFT ANTECUBITAL  Final   Special Requests   Final    BOTTLES DRAWN AEROBIC AND ANAEROBIC Blood Culture adequate volume  Culture   Final    NO GROWTH 5 DAYS Performed at [facility], [address] [place], [place] [address]    Report Status 03/06/2023 FINAL  Final  Blood culture (routine x 2)     Status: None   Collection Time: 03/01/23  8:20 PM   Specimen: BLOOD RIGHT HAND  Result Value Ref Range Status   Specimen Description BLOOD RIGHT HAND  Final   Special Requests   Final    BOTTLES DRAWN AEROBIC AND ANAEROBIC Blood Culture adequate volume   Culture   Final    NO GROWTH 5 DAYS Performed at [facility], [address], [place], [place] [address]    Report Status 03/06/2023 FINAL  Final         Radiology Studies: No results found.      Scheduled Meds:  amiodarone  200 mg Oral Daily   amitriptyline  10 mg Oral QHS   apixaban  5 mg Oral BID   atorvastatin  80 mg Oral Daily   carvedilol  25 mg Oral BID WC   Chlorhexidine Gluconate Cloth  6 each Topical Daily   Chlorhexidine Gluconate Cloth  6 each Topical Q0600   clopidogrel  75 mg Oral Daily   dapsone  100 mg Oral Daily   dolutegravir-rilpivirine  1 tablet Oral Q lunch   hydrALAZINE  100 mg Oral TID   isosorbide mononitrate  60 mg Oral Daily   lisinopril  10 mg Oral Daily   minoxidil  10 mg Oral BID   mirtazapine  7.5 mg Oral QHS   mupirocin ointment   Topical BID    sevelamer carbonate  2,400 mg Oral TID WC   vancomycin variable dose per unstable renal function (pharmacist dosing)   Does not apply See admin instructions   Continuous Infusions:  ceFEPime (MAXIPIME) IV 1 g (03/05/23 2145)     LOS: 4 days      Huey Bienenstock, [name]   To contact the attending provider between 7A-7P or the covering provider during after hours 7P-7A, please log into the web site [email or web address] and access using universal [place] password for that web site. If you do not have the password, please call the hospital operator.  03/06/2023, 1:42 PM

## 2023-03-06 NOTE — Sedation Documentation (Signed)
Patient administered 0.5mg  Dilaudid via IV and flushed with 10 cc NS.  Monitor continues to show sinus rhythm at this time with HR in mid 90s.  MD does not advise to perform EKG at this time and continue with previous plan.

## 2023-03-06 NOTE — Progress Notes (Signed)
Provider notified pt has no access and will be on schedule for tomorrow 4/20. 

## 2023-03-06 NOTE — Sedation Documentation (Signed)
Patient safely returned to room.  Report given to receiving RN Mitzi Davenport and advised that patient does not wish for procedure to be carried out at this time and of care delivered during time in IR department.  Patient transport via bed by CN Patty, this RN, and additional Engineer, drilling.  Receiving RN voices no questions at this time and states that she will message floor MD and inquire about further care to be delivered. Patient vitals and rhythm remain to appear unchanged at this time but still complains of discomfort.  Care of patient transferred to receiving RN.

## 2023-03-06 NOTE — TOC Transition Note (Signed)
DISCHARGE MEDS (1) LOCKED UP AT INPATIENT PHARMACY

## 2023-03-06 NOTE — Progress Notes (Addendum)
Rounding Note    Patient Name: [name] Date of Encounter: 03/06/2023   [facility] Cardiologist: Chrystie Nose, MD    Subjective   Asked to see patient again due to recurrent chest pain.  The patient was to have his dialysis access revised but complained of chest discomfort.  An EKG demonstrated sinus rhythm in lateral ST depressions unchanged from before.  Currently endorsing chest pain.  But seems to be getting better.  Troponin is pending.  Inpatient Medications    Scheduled Meds:  amiodarone  200 mg Oral Daily   amitriptyline  10 mg Oral QHS   apixaban  5 mg Oral BID   atorvastatin  80 mg Oral Daily   carvedilol  25 mg Oral BID WC   Chlorhexidine Gluconate Cloth  6 each Topical Daily   Chlorhexidine Gluconate Cloth  6 each Topical Q0600   clopidogrel  75 mg Oral Daily   dapsone  100 mg Oral Daily   dolutegravir-rilpivirine  1 tablet Oral Q lunch   hydrALAZINE  100 mg Oral TID   isosorbide mononitrate  60 mg Oral Daily   lisinopril  10 mg Oral Daily   minoxidil  10 mg Oral BID   mirtazapine  7.5 mg Oral QHS   mupirocin ointment   Topical BID   sevelamer carbonate  2,400 mg Oral TID WC   vancomycin variable dose per unstable renal function (pharmacist dosing)   Does not apply See admin instructions   Continuous Infusions:  ceFEPime (MAXIPIME) IV 1 g (03/05/23 2145)   PRN Meds: acetaminophen **OR** acetaminophen, albuterol, HYDROmorphone (DILAUDID) injection, nitroGLYCERIN, ondansetron **OR** ondansetron (ZOFRAN) IV, oxyCODONE, senna-docusate, sevelamer carbonate, traMADol, traZODone   Vital Signs    Vitals:   03/06/23 0356 03/06/23 1007 03/06/23 1050 03/06/23 1107  BP: (!) 127/56 (!) 131/47 (!) 122/53 (!) 166/94  Pulse:   77 92  Resp: 19 14 17 20   Temp: 97.8 F (36.6 C)     TempSrc: Oral     SpO2:   95%   Weight:      Height:        Intake/Output Summary (Last 24 hours) at 03/06/2023 1153 Last data filed at 03/06/2023 0600 Gross per 24  hour  Intake 600.11 ml  Output --  Net 600.11 ml      03/03/2023    4:40 PM 03/03/2023    1:16 PM 03/03/2023   10:25 AM  Last 3 Weights  Weight (lbs) 138 lb 10.7 oz 143 lb 1.3 oz 140 lb  Weight (kg) 62.9 kg 64.9 kg 63.504 kg      Telemetry    SR - Personally Reviewed  ECG    None new- Personally Reviewed  Physical Exam   GEN: No acute distress.   HEENT:  MMM, no JVD, no scleral icterus Cardiac: RRR, no murmurs, rubs, or gallops.  Do not appreciate loud murmur Respiratory: Clear to auscultation bilaterally. GI: Soft, nontender, non-distended  MS: No edema; No deformity. Neuro:  Nonfocal  Vasc:  +2 radial pulses; old LUE fistula  Labs    High Sensitivity Troponin:   Recent Labs  Lab 03/01/23 1730 03/01/23 2000 03/04/23 0911  TROPONINIHS 86* 105* 89*     Chemistry Recent Labs  Lab 03/01/23 1730 03/01/23 2000 03/01/23 2250 03/02/23 0415 03/03/23 0409 03/04/23 0910 03/06/23 0750  NA 134* 136  135   < > 134* 133* 137 134*  K 6.9* 6.7*  6.5*   < > 5.5* 5.0 4.8  6.1*  CL 92* 94*  94*   < > 95* 93* 94* 92*  CO2 < > 20* GLUCOSE 106* 104*  108*   < > 77 111* 102* 90  BUN 60* 63*  62*   < > 64* 42* 47* 72*  CREATININE 8.46* 8.75*  8.76*   < > 8.39* 6.91* 7.60* 11.56*  CALCIUM 9.2 9.2  9.1   < > 8.4* 8.6* 8.6* 8.7*  PROT 8.2* 8.3*  --   --  8.2*  --   --   ALBUMIN 3.3* 3.4*  --  3.1* 3.4*  --  3.1*  AST 39 46*  --   --  30  --   --   ALT 24 25  --   --  24  --   --   ALKPHOS 76 83  --   --  76  --   --   BILITOT 1.0 0.9  --   --  0.5  --   --   GFRNONAA 7* 6*  6*   < > 7* 8* 8* 5*  ANIONGAP 18* 19*  15   < > 19* 12 17* 20*   < > = values in this interval not displayed.    Lipids No results for input(s): "CHOL", "TRIG", "HDL", "LABVLDL", "LDLCALC", "CHOLHDL" in the last 168 hours.  Hematology Recent Labs  Lab 03/03/23 0409 03/04/23 0910 03/06/23 0750  WBC 6.5 6.9 7.3  RBC 4.44 4.45 4.16*  HGB 12.0* 12.3* 11.4*  HCT 40.6  39.6 37.5*  MCV 91.4 89.0 90.1  MCH 27.0 27.6 27.4  MCHC 29.6* 31.1 30.4  RDW 19.2* 18.6* 17.6*  PLT 231 239 233   Thyroid No results for input(s): "TSH", "FREET4" in the last 168 hours.  BNPNo results for input(s): "BNP", "PROBNP" in the last 168 hours.  DDimer No results for input(s): "DDIMER" in the last 168 hours.   Radiology    No results found.  Cardiac Studies   Echo 02/2023 ([facility])   MILD LV DYSFUNCTION (See above) WITH MILD LVH    MILD RV SYSTOLIC DYSFUNCTION (See above)    VALVULAR REGURGITATION: SEVERE AR, MILD MR, TRIVIAL PR, MILD TR    VALVULAR STENOSIS: MODERATE AS    --SEVERE DIFFUSE THICKENING OF AORTIC VALVE WITH SMALL, MOBILE TARGET SEEN    ON RIGHT CORONARY CUSP; ECHODENSE TISSUE AROUND AORTIC VALVE IS    CONCERNING FOR AORTIC ABSCESS.    --ASSOCIATED SEVERE AR    --SMALL OSCILATING MASS ON MV, DEGENERATIVE CHANGES VS. VEGETATION   Patient Profile     [name] is a 62 y.o. male with a hx of HIV, hypertension, ESRD on HD, paroxysmal atrial fibrillation, CAD s/p 4v CABG with recent s/p stent SVG-rPDA 12/2022 and aortic valve endocarditis who is being seen 03/04/2023 for the evaluation of chest pain/abnormal EKG at the request of Dr. Randol Kern.  Assessment & Plan       Chest pain CAD s/p 4v CABG' 2022 (LIMA-LAD, RIMA T graft-RI/OM1, SVG-PDA) Recent PCI/DES to SVG-rPDA 12/2022 Patient has had basically constant chest pain since his admission.  His EKG shows ST depressions laterally which is unchanged from prior.  My threshold to cath this patient was very high given his medical noncompliance.  If his troponin is highly elevated and I think we should pursue coronary angiography to definitively assess for important obstructive disease.  I discussed with the patient who agrees with plan.  If troponin not elevated, he is at low risk for issues around HD line revision.  Discussed with hospital medicine.  Aortic Valve Endocarditis HD catheter infection Per ID  note, no aortic root abscess.     ESRD on HD Hyperkalemia New HD line postponed due to chest pain   HTN Continue Coreg 25 mg twice daily, hydralazine 100 mg 3 times daily, Imdur 60 mg daily, lisinopril 10 mg daily   Paroxysmal Afib -- In sinus rhythm -- Continue amiodarone 200 mg daily, last dose Eliquis yesterday.  Would continue to hold until we decide about coronary angiography   HIV Depression    For questions or updates, please contact [facility] Please consult [email or web address] for contact info under        Signed, Orbie Pyo, MD  03/06/2023, 11:53 AM    ADDENDUM:  Repeat troponin stable.  Will defer coronary angiography.  Continue to treat medically for atypical chest pain.  Will sign off, call with ?s.

## 2023-03-06 NOTE — Progress Notes (Addendum)
[facility] Progress Note   Subjective:    Seen and examined patient at bedside. Informed by bedside RN of patient developing severe CP prior to [facility] placement today causing the procedure to be aborted. He reports SL nitro was given and EKG/troponins were performed. Noted SR on bedside monitor and HR/BP were both stable. He reports CP has eased up but still there. Currently denies SOB, N/V. Recently informed IR will not be able to place [facility] today. Noted repeat K+ today of 6.1 (I noted the 4.8 earlier). Will give Lokelma 10GM X 1 now and re-check level in the AM. Tentative plan for [facility] placement tomorrow followed by HD.  Objective Vitals:   03/06/23 1050 03/06/23 1107 03/06/23 1147 03/06/23 1415  BP: (!) 122/53 (!) 166/94 (!) 141/56 (!) 117/45  Pulse: 77 92 86 71  Resp: Temp:      TempSrc:      SpO2: 95%  97% 94%  Weight:      Height:       Physical Exam General: Well appearing man, NAD. Room air. Heart: RRR; no murmur Lungs: CTAB; no rales Abdomen: soft Extremities: no LE edema Dialysis Access: none  Filed Weights   03/03/23 1025 03/03/23 1316 03/03/23 1640  Weight: 63.5 kg 64.9 kg 62.9 kg    Intake/Output Summary (Last 24 hours) at 03/06/2023 1640 Last data filed at 03/06/2023 0600 Gross per 24 hour  Intake 600.11 ml  Output --  Net 600.11 ml    Additional Objective Labs: Basic Metabolic Panel: Recent Labs  Lab 03/02/23 0415 03/03/23 0409 03/04/23 0910 03/06/23 0750  NA 134* 133* 137 134*  K 5.5* 5.0 4.8 6.1*  CL 95* 93* 94* 92*  CO2 20* GLUCOSE 77 111* 102* 90  BUN 64* 42* 47* 72*  CREATININE 8.39* 6.91* 7.60* 11.56*  CALCIUM 8.4* 8.6* 8.6* 8.7*  PHOS 5.4*  --   --  7.5*   Liver Function Tests: Recent Labs  Lab 03/01/23 1730 03/01/23 2000 03/02/23 0415 03/03/23 0409 03/06/23 0750  AST 39 46*  --  30  --   ALT 24 25  --  24  --   ALKPHOS 76 83  --  76  --   BILITOT 1.0 0.9  --  0.5  --   PROT 8.2* 8.3*  --  8.2*   --   ALBUMIN 3.3* 3.4* 3.1* 3.4* 3.1*   No results for input(s): "LIPASE", "AMYLASE" in the last 168 hours. CBC: Recent Labs  Lab 03/01/23 1730 03/02/23 0415 03/03/23 0409 03/04/23 0910 03/06/23 0750  WBC 6.7 7.2 6.5 6.9 7.3  NEUTROABS 3.3  --   --   --   --   HGB 12.5* 11.6* 12.0* 12.3* 11.4*  HCT 40.4 39.1 40.6 39.6 37.5*  MCV 91.2 92.4 91.4 89.0 90.1  PLT 219 228 231 239 233   Blood Culture    Component Value Date/Time   SDES BLOOD RIGHT HAND 03/01/2023 2020   SPECREQUEST  03/01/2023 2020    BOTTLES DRAWN AEROBIC AND ANAEROBIC Blood Culture adequate volume   CULT  03/01/2023 2020    NO GROWTH 5 DAYS Performed at [facility], [address], [place], [place] [address]    REPTSTATUS 03/06/2023 FINAL 03/01/2023 2020    Cardiac Enzymes: No results for input(s): "CKTOTAL", "CKMB", "CKMBINDEX", "TROPONINI" in the last 168 hours. CBG: No results for input(s): "GLUCAP" in the last 168 hours. Iron Studies: No results  for input(s): "IRON", "TIBC", "TRANSFERRIN", "FERRITIN" in the last 72 hours. Lab Results  Component Value Date   INR 1.04 05/17/2018   INR 1.04 05/10/2018   INR 1.00 05/12/2012   Studies/Results: No results found.  Medications:  ceFEPime (MAXIPIME) IV 1 g (03/05/23 2145)    amiodarone  200 mg Oral Daily   amitriptyline  10 mg Oral QHS   apixaban  5 mg Oral BID   atorvastatin  80 mg Oral Daily   carvedilol  25 mg Oral BID WC   Chlorhexidine Gluconate Cloth  6 each Topical Daily   Chlorhexidine Gluconate Cloth  6 each Topical Q0600   clopidogrel  75 mg Oral Daily   dapsone  100 mg Oral Daily   dolutegravir-rilpivirine  1 tablet Oral Q lunch   hydrALAZINE  100 mg Oral TID   isosorbide mononitrate  60 mg Oral Daily   lisinopril  10 mg Oral Daily   minoxidil  10 mg Oral BID   mirtazapine  7.5 mg Oral QHS   mupirocin ointment   Topical BID   sevelamer carbonate  2,400 mg Oral TID WC   vancomycin variable dose per unstable renal function  (pharmacist dosing)   Does not apply See admin instructions    Dialysis Orders: TTS [place]/ [place] [facility] 4h  420/ 800   68kg   2/2/ bath  Heparin ?  RIJ TDC  Assessment/Plan: AoV endocarditis - vegetation on tip of TDC: Blood Cx negative from early April at [facility]. TDC removed 4/17, patient developed CP prior to [facility] placement today thus procedure was aborted. SL nitro given and EKG/troponins were performed. Informed of IR not being able to do [facility] placement today. Plan is to replace [facility] tomorrow AM followed by HD. On Vancomycin + Cefepime with HD x 6 week course. ESRD: Usual TTS schedule - HD yesterday (4/16) - see above, next tentatively Saturday (4/20) after new TDC placement. Lokelma 10g on 4/17, 4/18, and 4/19 to hold him over. Checking K+ level this evening and again in the AM. HTN/volume: BP controlled, on multiple HTN meds. Close to euvolemic, well below prior EDW. 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  Secondary HPTH: Ca/Phos ok - continue sevelamer as binder. HIV: Continue home meds Hx perirectal boil  Salome Holmes, NP [facility] 03/06/2023,4:40 PM  LOS: 4 days

## 2023-03-07 ENCOUNTER — Inpatient Hospital Stay (HOSPITAL_COMMUNITY): Payer: 59

## 2023-03-07 DIAGNOSIS — I214 Non-ST elevation (NSTEMI) myocardial infarction: Secondary | ICD-10-CM | POA: Diagnosis not present

## 2023-03-07 DIAGNOSIS — Z992 Dependence on renal dialysis: Secondary | ICD-10-CM | POA: Diagnosis not present

## 2023-03-07 DIAGNOSIS — N186 End stage renal disease: Secondary | ICD-10-CM | POA: Diagnosis not present

## 2023-03-07 DIAGNOSIS — I330 Acute and subacute infective endocarditis: Secondary | ICD-10-CM | POA: Diagnosis not present

## 2023-03-07 DIAGNOSIS — I358 Other nonrheumatic aortic valve disorders: Secondary | ICD-10-CM | POA: Diagnosis not present

## 2023-03-07 DIAGNOSIS — B20 Human immunodeficiency virus [HIV] disease: Secondary | ICD-10-CM | POA: Diagnosis not present

## 2023-03-07 HISTORY — PX: IR US GUIDE VASC ACCESS RIGHT: IMG2390

## 2023-03-07 HISTORY — PX: IR FLUORO GUIDE CV LINE RIGHT: IMG2283

## 2023-03-07 LAB — CBC WITH DIFFERENTIAL/PLATELET
Abs Immature Granulocytes: 0.02 10*3/uL (ref 0.00–0.07)
Basophils Absolute: 0.1 10*3/uL (ref 0.0–0.1)
Basophils Relative: 1 %
Eosinophils Absolute: 0.4 10*3/uL (ref 0.0–0.5)
Eosinophils Relative: 7 %
HCT: 30.5 % — ABNORMAL LOW (ref 39.0–52.0)
Hemoglobin: 9.7 g/dL — ABNORMAL LOW (ref 13.0–17.0)
Immature Granulocytes: 0 %
Lymphocytes Relative: 27 %
Lymphs Abs: 1.7 10*3/uL (ref 0.7–4.0)
MCH: 28.2 pg (ref 26.0–34.0)
MCHC: 31.8 g/dL (ref 30.0–36.0)
MCV: 88.7 fL (ref 80.0–100.0)
Monocytes Absolute: 0.9 10*3/uL (ref 0.1–1.0)
Monocytes Relative: 14 %
Neutro Abs: 3.2 10*3/uL (ref 1.7–7.7)
Neutrophils Relative %: 51 %
Platelets: 185 10*3/uL (ref 150–400)
RBC: 3.44 MIL/uL — ABNORMAL LOW (ref 4.22–5.81)
RDW: 17.6 % — ABNORMAL HIGH (ref 11.5–15.5)
WBC: 6.4 10*3/uL (ref 4.0–10.5)
nRBC: 0 % (ref 0.0–0.2)

## 2023-03-07 LAB — RENAL FUNCTION PANEL
Albumin: 3.1 g/dL — ABNORMAL LOW (ref 3.5–5.0)
Anion gap: 18 — ABNORMAL HIGH (ref 5–15)
BUN: 91 mg/dL — ABNORMAL HIGH (ref 8–23)
CO2: 21 mmol/L — ABNORMAL LOW (ref 22–32)
Calcium: 8.4 mg/dL — ABNORMAL LOW (ref 8.9–10.3)
Chloride: 93 mmol/L — ABNORMAL LOW (ref 98–111)
Creatinine, Ser: 13.89 mg/dL — ABNORMAL HIGH (ref 0.61–1.24)
GFR, Estimated: 4 mL/min — ABNORMAL LOW (ref >60)
Glucose, Bld: 106 mg/dL — ABNORMAL HIGH (ref 70–99)
Phosphorus: 8.2 mg/dL — ABNORMAL HIGH (ref 2.5–4.6)
Potassium: 5.8 mmol/L — ABNORMAL HIGH (ref 3.5–5.1)
Sodium: 132 mmol/L — ABNORMAL LOW (ref 135–145)

## 2023-03-07 LAB — GLUCOSE, CAPILLARY: Glucose-Capillary: 167 mg/dL — ABNORMAL HIGH (ref 70–99)

## 2023-03-07 MED ORDER — HEPARIN SODIUM (PORCINE) 1000 UNIT/ML IJ SOLN
INTRAMUSCULAR | Status: AC
  Filled 2023-03-07: qty 10

## 2023-03-07 MED ORDER — LORAZEPAM 2 MG/ML IJ SOLN
0.5000 mg | Freq: Four times a day (QID) | INTRAMUSCULAR | Status: DC | PRN
  Administered 2023-03-07 – 2023-03-10 (×3): 0.5 mg via INTRAVENOUS
  Filled 2023-03-07 (×2): qty 1

## 2023-03-07 MED ORDER — FENTANYL CITRATE (PF) 100 MCG/2ML IJ SOLN
INTRAMUSCULAR | Status: AC
  Filled 2023-03-07: qty 2

## 2023-03-07 MED ORDER — LIDOCAINE HCL 1 % IJ SOLN
INTRAMUSCULAR | Status: AC
  Filled 2023-03-07: qty 20

## 2023-03-07 MED ORDER — SODIUM ZIRCONIUM CYCLOSILICATE 10 G PO PACK
10.0000 g | PACK | ORAL | Status: AC
  Administered 2023-03-07: 10 g via ORAL
  Filled 2023-03-07: qty 1

## 2023-03-07 MED ORDER — LORAZEPAM 2 MG/ML IJ SOLN
INTRAMUSCULAR | Status: AC
  Administered 2023-03-07: 1 mg via INTRAMUSCULAR
  Filled 2023-03-07: qty 1

## 2023-03-07 MED ORDER — SODIUM CHLORIDE 0.9% FLUSH
10.0000 mL | Freq: Two times a day (BID) | INTRAVENOUS | Status: DC
  Administered 2023-03-07 – 2023-03-10 (×6): 10 mL

## 2023-03-07 MED ORDER — STERILE WATER FOR INJECTION IJ SOLN
INTRAMUSCULAR | Status: AC
  Filled 2023-03-07: qty 10

## 2023-03-07 MED ORDER — DEXTROSE 50 % IV SOLN
1.0000 | Freq: Once | INTRAVENOUS | Status: AC
  Administered 2023-03-07: 50 mL via INTRAVENOUS
  Filled 2023-03-07: qty 50

## 2023-03-07 MED ORDER — MIDAZOLAM HCL 2 MG/2ML IJ SOLN
INTRAMUSCULAR | Status: AC
  Filled 2023-03-07: qty 2

## 2023-03-07 MED ORDER — LORAZEPAM 2 MG/ML IJ SOLN: 1.0000 mg | Freq: Once | INTRAMUSCULAR | Status: AC

## 2023-03-07 MED ORDER — ZIPRASIDONE MESYLATE 20 MG IM SOLR
20.0000 mg | Freq: Once | INTRAMUSCULAR | Status: DC
  Filled 2023-03-07: qty 20

## 2023-03-07 MED ORDER — INSULIN ASPART 100 UNIT/ML IV SOLN
5.0000 [IU] | Freq: Once | INTRAVENOUS | Status: AC
  Administered 2023-03-07: 5 [IU] via INTRAVENOUS

## 2023-03-07 MED ORDER — LORAZEPAM 2 MG/ML IJ SOLN
INTRAMUSCULAR | Status: AC
  Filled 2023-03-07: qty 1

## 2023-03-07 NOTE — Sedation Documentation (Signed)
Procedure delayed. IV not accessible for medication due to infiltration Patient verbally agitated with repeat attempts to gain IV access from multiple staff members. Patient does not have awareness of procedure, previous consent, and interventions. Refusing at this time. El-Abd aware. Elgergawy aware and coming to see patient FTF.

## 2023-03-07 NOTE — Sedation Documentation (Signed)
Elgergawy and [place]her[place] at bedside to discuss plan of care with the bedside. Patient verbally agitated.

## 2023-03-07 NOTE — Progress Notes (Addendum)
PROGRESS NOTE    [name]  ZOX:[id number] DOB: November 06, 1961 DOA: 03/02/2023 PCP: [name], MD    Chief Complaint  Patient presents with   Chest Pain    Brief Narrative:   Mr. Nunziato was admitted to the hospital with the working diagnosis of aortic valve endocarditis.    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 CT surgery determined patient was not a surgical candidate.  Patient left the hospital AMA, 04/11, then was re-hospitalized 04/13 but left again AMA on the 04/14.  Admitted to [facility] on 04/15 and left AMA later the same day.  At home with chest pain and presented back to the hospital.  On his initial physical examination his blood pressure was 156.73, HR 87, RR 16 and 02 saturation 100%, lungs with no wheezing or rales, heart with S1 and S2 present and rhythmic, abdomen with no distention and no lower extremity edema.    Na 134, K 5,5 Cl 95, bicarbonate 20, glucose 77 bun 64 cr 8.39 Wbc 7.7 hgb 11,6 plt 228    EKG 84 bpm, left axis deviation, left anterior fascicular block, qtc 503, sinus rhythm with 1st degree AV block, ST depression V5 and V6, no significant T wave changes. Positive LVH.   Assessment & Plan:   Principal Problem:   Aortic valve endocarditis Active Problems:   ESRD on hemodialysis   CHF (congestive heart failure)   Hypertension   Human immunodeficiency virus (HIV) disease (HCC)   Coronary artery disease   Paroxysmal atrial fibrillation   Depression   Aortic valve endocarditis Vegetation on tip of TDC  - ID input greatly appreciated, continue antibiotic therapy with IV vancomycin and cefepime. - TDC continued today, plan for line holiday. -No aortic root abscess.  EEG done at [facility] once  reviewed by her ID -[facility] discontinued yesterday 4/17, plan for [facility] today, has been postponed given chest pain, while patient was downstairs he was complaining of significant chest pain, return to the floor for further evaluation, ,now patient's chest pain-free, and his cardiac workup is reassuring, troponins are stable, EKG nonacute, cardiology input as well greatly appreciated, he is low risk for [facility], so plan for [facility] later today.  ESRD on hemodialysis Hyperkalemia. Hyponatremia -renal input  greatly appreciated, continue with HD, patient on line holiday, TDC was discontinued on 4/17, patient remains off dialysis for last 3 days, unfortunately he has been very noncompliant with reinsertion of HD access, he already returned from IR x 2 due to noncompliance agitation and refusal, have discussed with his best friend Merdis Delay who is currently at his bedside, patient agreeable to proceed with the procedure with her, have as needed Geodon  in case he becomes restless and agitated while in IR. -Hyperkalemia this morning required Lokelma x 2, D50 with IV insulin.   CHF (congestive heart failure) No clinical signs of decompensation. Volume management with HD   Hypertension Continue blood pressure control with carvedilol, isosorbide, hydralazine and lisinopril. Patient on minoxidil 7,5 mg daily.    Human immunodeficiency virus (HIV) disease (HCC) Continue antiretroviral therapy. Management per ID   Coronary artery disease Chest pain -Patient continues to have intermittent episodes of chest pain, of nontypical features, has been seen and evaluated by cardiology for that, he  had recurrent events like that, where his repeat multiple EKGs has been nonacute, as well his troponin was at baseline nonconcerning for acute ACS.   -No indication for cardiac cath currently, continue with medical therapy including Eliquis, Plavix, Coreg, Imdur and lisinopril, aspirin has been discontinued as patient on Eliquis    Paroxysmal atrial fibrillation Icthaband for anticoagulation, currently on hold due to recurrent oozing from [facility] removal site Rate control with carvedilol and amiodarone.    Depression Continue with amitriptyline and mirtazapine.   oozing from [facility] removal site -Globin has been remained stable so far, required multiple dressing changes over last 24 hours due to significant oozing, continue to monitor CBC closely, will have to hold Eliquis   I have discussed with patient multiple times during this hospitalization that his history of noncompliance, refusal of care, and signing AMA multiple times from multiple facilities, puts his life at risk.   DVT prophylaxis: Eliquis on hold due to recurrent oozing from site of his [facility] discontinued catheter Code Status: (Full) Family Communication: Discussed with his best friend Purcell Mouton at bedside Disposition:   Status is: Inpatient    Consultants:  Renal ID cardiology   Subjective:  HD catheter by IR has supported today, given his combativeness and agitation during procedure  Objective: Vitals:   03/07/23 1034 03/07/23 1100 03/07/23 1200 03/07/23 1202  BP: 119/67  122/61 122/61  Pulse:  92    Resp:  Temp: 98.2 F (36.8 C)     TempSrc: Oral     SpO2:  93%    Weight:      Height:       No intake or output data in the 24 hours ending 03/07/23 1409   Filed Weights   03/03/23 1025 03/03/23 1316 03/03/23 1640  Weight: 63.5 kg 64.9 kg 62.9 kg    Examination:    Patient was seen and examined multiple times during the day, as well has been seen while in IR, He is awake, confused, agitated, restless, and occasionally combative Good air entry bilaterally Regular rate and rhythm, no rubs or gallops Abdomen soft, bowel sounds present Extremities with no edema clubbing or cyanosis.     Data Reviewed: I have personally reviewed following labs and imaging studies  CBC: Recent Labs  Lab 03/01/23 1730  03/02/23 0415 03/03/23 0409 03/04/23 0910 03/06/23 0750 03/07/23 0552  WBC 6.7 7.2 6.5 6.9 7.3 6.4  NEUTROABS 3.3  --   --   --   --  3.2  HGB 12.5* 11.6* 12.0* 12.3* 11.4* 9.7*  HCT 40.4 39.1 40.6 39.6 37.5* 30.5*  MCV 91.2 92.4 91.4 89.0 90.1 88.7  PLT 219 228 231 239 233 185    Basic Metabolic Panel: Recent Labs  Lab 03/02/23 0415 03/03/23 0409 03/04/23 0910 03/06/23 0750 03/06/23 2036 03/07/23 0552  NA 134* 133* 137 134*  --  132*  K 5.5* 5.0 4.8 6.1* 6.2* 5.8*  CL 95* 93* 94* 92*  --  93*  CO2 20* --  21*  GLUCOSE 77 111* 102* 90  --  106*  BUN 64* 42* 47* 72*  --  91*  CREATININE 8.39* 6.91* 7.60* 11.56*  --  13.89*  CALCIUM 8.4* 8.6* 8.6* 8.7*  --  8.4*  PHOS 5.4*  --   --  7.5*  --  8.2*    GFR: Estimated Creatinine Clearance: 4.9 mL/min (A) (by C-G formula based on SCr of 13.89 mg/dL (H)).  Liver Function Tests: Recent Labs  Lab 03/01/23 1730 03/01/23 2000 03/02/23 0415 03/03/23 0409 03/06/23 0750 03/07/23 0552  AST 39 46*  --  30  --   --   ALT 24 25  --  24  --   --   ALKPHOS 76 83  --  76  --   --   BILITOT 1.0 0.9  --  0.5  --   --   PROT 8.2* 8.3*  --  8.2*  --   --   ALBUMIN 3.3* 3.4* 3.1* 3.4* 3.1* 3.1*    CBG: Recent Labs  Lab 03/07/23 1032  GLUCAP 167*     Recent Results (from the past 240 hour(s))  Blood culture (routine x 2)     Status: None   Collection Time: 03/01/23  8:00 PM   Specimen: BLOOD  Result Value Ref Range Status   Specimen Description BLOOD LEFT ANTECUBITAL  Final   Special Requests   Final    BOTTLES DRAWN AEROBIC AND ANAEROBIC Blood Culture adequate volume   Culture   Final    NO GROWTH 5 DAYS Performed at [facility], [address], [place], [place] [address]    Report Status 03/06/2023 FINAL  Final  Blood culture (routine x 2)     Status: None   Collection Time: 03/01/23  8:20 PM   Specimen: BLOOD RIGHT HAND  Result Value Ref Range Status   Specimen Description BLOOD RIGHT HAND   Final   Special Requests   Final    BOTTLES DRAWN AEROBIC AND ANAEROBIC Blood Culture adequate volume   Culture   Final    NO GROWTH 5 DAYS Performed at [facility], [address], [place], [place] [address]    Report Status 03/06/2023 FINAL  Final         Radiology Studies: No results found.      Scheduled Meds:  amiodarone  200 mg Oral Daily   amitriptyline  10 mg Oral QHS   atorvastatin  80 mg Oral Daily   carvedilol  25 mg Oral BID WC   Chlorhexidine Gluconate Cloth  6 each Topical Daily   Chlorhexidine Gluconate Cloth  6 each Topical Q0600   clopidogrel  75 mg Oral Daily   dapsone  100 mg Oral Daily   dolutegravir-rilpivirine  1 tablet Oral Q lunch   hydrALAZINE  100 mg Oral TID   isosorbide mononitrate  60 mg Oral Daily   lisinopril  10 mg Oral Daily   minoxidil  10 mg Oral BID   mirtazapine  7.5 mg Oral QHS   mupirocin ointment   Topical BID   sevelamer carbonate  2,400 mg Oral TID WC   vancomycin variable dose per unstable renal function (pharmacist dosing)   Does not apply See admin instructions   ziprasidone  20 mg Intramuscular Once   Continuous Infusions:  ceFEPime (MAXIPIME) IV 1 g (03/06/23 2140)     LOS: 5 days      Huey Bienenstock, MD [facility]   To contact the attending provider between 7A-7P or the covering provider during after hours 7P-7A, please log into the web site [email or web address] and access using universal [place] password for that web site. If you do not have the password, please call the hospital operator.  03/07/2023, 2:09 PM

## 2023-03-07 NOTE — Progress Notes (Signed)
TX duration: 3h60mins  Patient tolerated well.  Alert, without acute distress.  Hand-off given to patient's nurse.   Access used: R IJ triple lumen Access issues: None  Total UF removed: Medication(s) given: None   03/07/23 1846  Vitals  Temp 97.9 F (36.6 C)  Temp Source Oral  BP (!) 118/54  MAP (mmHg) 71  BP Location Right Arm  BP Method Automatic  Patient Position (if appropriate) Lying  Pulse Rate 82  Pulse Rate Source Monitor  ECG Heart Rate 81  Resp 16  Oxygen Therapy  SpO2 96 %  O2 Device Nasal Cannula  O2 Flow Rate (L/min) 3 L/min  Pulse Oximetry Type Continuous  During Treatment Monitoring  Intra-Hemodialysis Comments Tx completed;Tolerated well  Post Treatment  Dialyzer Clearance Lightly streaked  Duration of HD Treatment -hour(s) 3.15 hour(s)  Liters Processed 76.1  Fluid Removed (mL) 2200 mL  Tolerated HD Treatment  (patient awaken from sleep, agitated, wanting to come off the machine; and trying to get OOB. Tx ended with left on the machine.)  Hemodialysis Catheter Right Internal jugular Triple lumen Temporary (Non-Tunneled)  Placement Date/Time: 03/07/23 1504   Serial / Lot #: [id number]  Expiration Date: 04/15/27  Time Out: Correct patient;Correct site;Correct procedure  Maximum sterile barrier precautions: Hand hygiene;Cap;Mask;Sterile gown;Sterile gloves;Large sterile ...  Site Condition No complications  Blue Lumen Status Dead end cap in place;Heparin locked  Red Lumen Status Dead end cap in place;Heparin locked  Purple Lumen Status Saline locked  Catheter fill solution Heparin 1000 units/ml  Catheter fill volume (Arterial) 1.3 cc  Catheter fill volume (Venous) 1.3  Dressing Type Transparent  Dressing Status Antimicrobial disc in place;Clean, Dry, Intact  Interventions Other (Comment)  Drainage Description None  Dressing Change Due 03/14/23  Post treatment catheter status Capped and Clamped     Denice Cardon [facility]

## 2023-03-07 NOTE — Progress Notes (Signed)
Pt eventually agreed to the procedure but only after his friend arrived to the IR lab. Hopefully we can get him dialyzed this evening.   Vinson Moselle, MD 03/07/2023, 4:53 PM

## 2023-03-07 NOTE — Progress Notes (Signed)
[name] Progress Note   Subjective:    Seen and examined patient at bedside. Currently on RA but he reports mild SOB. Also noted mild myoclonic jerking. K+ 5.9 this AM and another dose Lokelma was given. Concerned he is starting to show signs for uremia and needs TDC placed ASAP. Discussed with Dr. Arlean Hopping: patient currently in IR and becoming very agitated. Plan to follow-up with IR on this situation.  Objective Vitals:   03/07/23 1034 03/07/23 1100 03/07/23 1200 03/07/23 1202  BP: 119/67  122/61 122/61  Pulse:  92    Resp:  Temp: 98.2 F (36.8 C)     TempSrc: Oral     SpO2:  93%    Weight:      Height:       Physical Exam General: Well appearing man, NAD. Room air. Heart: RRR; no murmur Lungs: CTAB; no rales Abdomen: soft Extremities: no LE edema Neuro: Noted mild myoclonic jerking bilateral upper extremities Dialysis Access: none   Filed Weights   03/03/23 1025 03/03/23 1316 03/03/23 1640  Weight: 63.5 kg 64.9 kg 62.9 kg   No intake or output data in the 24 hours ending 03/07/23 1316  Additional Objective Labs: Basic Metabolic Panel: Recent Labs  Lab 03/02/23 0415 03/03/23 0409 03/04/23 0910 03/06/23 0750 03/06/23 2036 03/07/23 0552  NA 134*   < > 137 134*  --  132*  K 5.5*   < > 4.8 6.1* 6.2* 5.8*  CL 95*   < > 94* 92*  --  93*  CO2 20*   < > 26 22  --  21*  GLUCOSE 77   < > 102* 90  --  106*  BUN 64*   < > 47* 72*  --  91*  CREATININE 8.39*   < > 7.60* 11.56*  --  13.89*  CALCIUM 8.4*   < > 8.6* 8.7*  --  8.4*  PHOS 5.4*  --   --  7.5*  --  8.2*   < > = values in this interval not displayed.   Liver Function Tests: Recent Labs  Lab 03/01/23 1730 03/01/23 2000 03/02/23 0415 03/03/23 0409 03/06/23 0750 03/07/23 0552  AST 39 46*  --  30  --   --   ALT 24 25  --  24  --   --   ALKPHOS 76 83  --  76  --   --   BILITOT 1.0 0.9  --  0.5  --   --   PROT 8.2* 8.3*  --  8.2*  --   --   ALBUMIN 3.3* 3.4*   < > 3.4* 3.1*  3.1*   < > = values in this interval not displayed.   No results for input(s): "LIPASE", "AMYLASE" in the last 168 hours. CBC: Recent Labs  Lab 03/01/23 1730 03/02/23 0415 03/03/23 0409 03/04/23 0910 03/06/23 0750 03/07/23 0552  WBC 6.7 7.2 6.5 6.9 7.3 6.4  NEUTROABS 3.3  --   --   --   --  3.2  HGB 12.5* 11.6* 12.0* 12.3* 11.4* 9.7*  HCT 40.4 39.1 40.6 39.6 37.5* 30.5*  MCV 91.2 92.4 91.4 89.0 90.1 88.7  PLT 219 228 231 239 233 185   Blood Culture    Component Value Date/Time   SDES BLOOD RIGHT HAND 03/01/2023 2020   SPECREQUEST  03/01/2023 2020    BOTTLES DRAWN AEROBIC AND ANAEROBIC Blood Culture adequate volume   CULT  03/01/2023 2020  NO GROWTH 5 DAYS Performed at [facility], [address], [place], [place] [address]    REPTSTATUS 03/06/2023 FINAL 03/01/2023 2020    Cardiac Enzymes: No results for input(s): "CKTOTAL", "CKMB", "CKMBINDEX", "TROPONINI" in the last 168 hours. CBG: Recent Labs  Lab 03/07/23 1032  GLUCAP 167*   Iron Studies: No results for input(s): "IRON", "TIBC", "TRANSFERRIN", "FERRITIN" in the last 72 hours. Lab Results  Component Value Date   INR 1.04 05/17/2018   INR 1.04 05/10/2018   INR 1.00 05/12/2012   Studies/Results: No results found.  Medications:  ceFEPime (MAXIPIME) IV 1 g (03/06/23 2140)    amiodarone  200 mg Oral Daily   amitriptyline  10 mg Oral QHS   atorvastatin  80 mg Oral Daily   carvedilol  25 mg Oral BID WC   Chlorhexidine Gluconate Cloth  6 each Topical Daily   Chlorhexidine Gluconate Cloth  6 each Topical Q0600   clopidogrel  75 mg Oral Daily   dapsone  100 mg Oral Daily   dolutegravir-rilpivirine  1 tablet Oral Q lunch   hydrALAZINE  100 mg Oral TID   isosorbide mononitrate  60 mg Oral Daily   lisinopril  10 mg Oral Daily   LORazepam       LORazepam  1 mg Intramuscular Once   minoxidil  10 mg Oral BID   mirtazapine  7.5 mg Oral QHS   mupirocin ointment   Topical BID   sevelamer carbonate   2,400 mg Oral TID WC   vancomycin variable dose per unstable renal function (pharmacist dosing)   Does not apply See admin instructions    Dialysis Orders: TTS [place]/ [place] 4h  420/ 800   68kg   2/2/ bath  Heparin ?  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 TDC removed 4/17. On Vancomycin + Cefepime with HD x 6 week course. ESRD: Usual TTS schedule - Last HD 03/03/23. Patient with ongoing hyperkalemia and now having myoclonic jerking within bilateral upper extremities. I am concerned he is starting to show signs for uremia. Patient currently in IR very agitated and may not be able to have TDC placed in IR. Will follow-up on situation. Worse case scenario, may need ICU admit for intubation/sedation to proceed with [facility] placement.  HTN/volume: BP controlled, on multiple HTN meds. Close to euvolemic, well below prior EDW. 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  Secondary HPTH: Ca/Phos ok - continue sevelamer as binder. HIV: Continue home meds Hx perirectal boil  Salome Holmes, NP [facility] 03/07/2023,1:16 PM  LOS: 5 days

## 2023-03-07 NOTE — Progress Notes (Signed)
Pharmacy Antibiotic Note  [name] is a 62 y.o. male  with culture negative endocarditis.  Pharmacy has been consulted for Cefepime + Vancomycin dosing.  Patient went for [facility] placement 4/20 and completed a HD session of ~3 hours (per RN note) at a BFR of 400 per documentation. Patient does not have standing vancomycin doses at this time.   4/19 pre-HD vancomycin level 28; estimated post-HD level  ~19. No residual urine output documented.    Plan: - Cefepime 1g IV Q24H  - Follow-up vancomycin random level with AM labs  - Will continue to follow HD schedule/duration, culture results, LOT, and antibiotic de-escalation plans   Height:  (165.1 cm) Weight: 63.8 kg (140 lb 10.5 oz) IBW/kg (Calculated) : 61.5  Temp (24hrs), Avg:97.9 F (36.6 C), Min:97.5 F (36.4 C), Max:98.2 F (36.8 C)  Recent Labs  Lab 03/02/23 0415 03/03/23 0409 03/04/23 0909 03/04/23 0910 03/06/23 0750 03/06/23 1138 03/07/23 0552  WBC 7.2 6.5  --  6.9 7.3  --  6.4  CREATININE 8.39* 6.91*  --  7.60* 11.56*  --  13.89*  VANCORANDOM  --   --  39  --   --  28  --      Estimated Creatinine Clearance: 4.9 mL/min (A) (by C-G formula based on SCr of 13.89 mg/dL (H)).    Allergies  Allergen Reactions   Penicillins Anaphylaxis    Has patient had a PCN reaction causing immediate rash, facial/tongue/throat swelling, SOB or lightheadedness with hypotension: Unknown Has patient had a PCN reaction causing severe rash involving mucus membranes or skin necrosis: Unknown Has patient had a PCN reaction that required hospitalization: Unknown Has patient had a PCN reaction occurring within the last 10 years: No If all of the above answers are "NO", then may proceed with Cephalosporin use.   Pepcid [Famotidine] Other (See Comments)   Prilosec [Omeprazole] Other (See Comments)    Antimicrobials this admission: Vanc + CTX PTA at [facility] 4/16 >> Rocephin 4/16 >> 4/17 Cefepime 4/17 x 1  Dose adjustments this  admission: 4/14 VR on admit after being at [facility] = 16 mcg/ml 4/17 VR 39 mcg/ml (from infiltrated arm so falsely high but still likely >15 mcg/ml)  Microbiology results: 4/14 BCx >> ngtd 4/17 BCx (ordered)  Thank you for allowing pharmacy to be a part of this patient's care.  Jani Gravel, PharmD PGY-2 Infectious Diseases Resident  03/07/2023 7:54 PM

## 2023-03-07 NOTE — Sedation Documentation (Signed)
Procedure aborted

## 2023-03-08 DIAGNOSIS — I358 Other nonrheumatic aortic valve disorders: Secondary | ICD-10-CM | POA: Diagnosis not present

## 2023-03-08 DIAGNOSIS — I214 Non-ST elevation (NSTEMI) myocardial infarction: Secondary | ICD-10-CM

## 2023-03-08 DIAGNOSIS — Z992 Dependence on renal dialysis: Secondary | ICD-10-CM | POA: Diagnosis not present

## 2023-03-08 DIAGNOSIS — I330 Acute and subacute infective endocarditis: Secondary | ICD-10-CM | POA: Diagnosis not present

## 2023-03-08 DIAGNOSIS — N186 End stage renal disease: Secondary | ICD-10-CM | POA: Diagnosis not present

## 2023-03-08 DIAGNOSIS — B20 Human immunodeficiency virus [HIV] disease: Secondary | ICD-10-CM | POA: Diagnosis not present

## 2023-03-08 LAB — RENAL FUNCTION PANEL
Albumin: 2.8 g/dL — ABNORMAL LOW (ref 3.5–5.0)
Anion gap: 16 — ABNORMAL HIGH (ref 5–15)
BUN: 45 mg/dL — ABNORMAL HIGH (ref 8–23)
CO2: 26 mmol/L (ref 22–32)
Calcium: 8.7 mg/dL — ABNORMAL LOW (ref 8.9–10.3)
Chloride: 93 mmol/L — ABNORMAL LOW (ref 98–111)
Creatinine, Ser: 9.19 mg/dL — ABNORMAL HIGH (ref 0.61–1.24)
GFR, Estimated: 6 mL/min — ABNORMAL LOW (ref >60)
Glucose, Bld: 81 mg/dL (ref 70–99)
Phosphorus: 5.8 mg/dL — ABNORMAL HIGH (ref 2.5–4.6)
Potassium: 4.6 mmol/L (ref 3.5–5.1)
Sodium: 135 mmol/L (ref 135–145)

## 2023-03-08 LAB — CBC
HCT: 32.3 % — ABNORMAL LOW (ref 39.0–52.0)
Hemoglobin: 9.8 g/dL — ABNORMAL LOW (ref 13.0–17.0)
MCH: 27.7 pg (ref 26.0–34.0)
MCHC: 30.3 g/dL (ref 30.0–36.0)
MCV: 91.2 fL (ref 80.0–100.0)
Platelets: 205 10*3/uL (ref 150–400)
RBC: 3.54 MIL/uL — ABNORMAL LOW (ref 4.22–5.81)
RDW: 17.2 % — ABNORMAL HIGH (ref 11.5–15.5)
WBC: 6.2 10*3/uL (ref 4.0–10.5)
nRBC: 0 % (ref 0.0–0.2)

## 2023-03-08 LAB — VANCOMYCIN, RANDOM: Vancomycin Rm: 16 ug/mL

## 2023-03-08 LAB — HEPARIN LEVEL (UNFRACTIONATED): Heparin Unfractionated: 0.68 IU/mL (ref 0.30–0.70)

## 2023-03-08 LAB — TROPONIN I (HIGH SENSITIVITY)
Troponin I (High Sensitivity): 5152 ng/L (ref <18)
Troponin I (High Sensitivity): 4462 ng/L (ref <18)

## 2023-03-08 MED ORDER — VANCOMYCIN HCL 500 MG/100ML IV SOLN
500.0000 mg | Freq: Once | INTRAVENOUS | Status: AC
  Administered 2023-03-08: 500 mg via INTRAVENOUS
  Filled 2023-03-08: qty 100

## 2023-03-08 MED ORDER — ALTEPLASE 2 MG IJ SOLR: 2.0000 mg | Freq: Once | INTRAMUSCULAR | Status: DC | PRN

## 2023-03-08 MED ORDER — HEPARIN (PORCINE) 25000 UT/250ML-% IV SOLN
900.0000 [IU]/h | INTRAVENOUS | Status: DC
  Administered 2023-03-08: 750 [IU]/h via INTRAVENOUS
  Administered 2023-03-09: 900 [IU]/h via INTRAVENOUS
  Filled 2023-03-08 (×2): qty 250

## 2023-03-08 MED ORDER — SODIUM CHLORIDE 0.9 % IV BOLUS
500.0000 mL | Freq: Once | INTRAVENOUS | Status: AC | PRN
  Administered 2023-03-08: 250 mL via INTRAVENOUS

## 2023-03-08 MED ORDER — SODIUM CHLORIDE 0.9 % IV BOLUS: 250.0000 mL | Freq: Once | INTRAVENOUS | Status: DC

## 2023-03-08 MED ORDER — MIDODRINE HCL 5 MG PO TABS
5.0000 mg | ORAL_TABLET | Freq: Three times a day (TID) | ORAL | Status: DC
  Administered 2023-03-08: 5 mg via ORAL
  Filled 2023-03-08 (×2): qty 1

## 2023-03-08 MED ORDER — LIDOCAINE-PRILOCAINE 2.5-2.5 % EX CREA: 1.0000 | TOPICAL_CREAM | CUTANEOUS | Status: DC | PRN

## 2023-03-08 MED ORDER — LIDOCAINE HCL (PF) 1 % IJ SOLN: 5.0000 mL | INTRAMUSCULAR | Status: DC | PRN

## 2023-03-08 MED ORDER — PENTAFLUOROPROP-TETRAFLUOROETH EX AERO: 1.0000 | INHALATION_SPRAY | CUTANEOUS | Status: DC | PRN

## 2023-03-08 MED ORDER — HEPARIN SODIUM (PORCINE) 1000 UNIT/ML DIALYSIS
1000.0000 [IU] | INTRAMUSCULAR | Status: DC | PRN
  Administered 2023-03-09: 1000 [IU] via INTRAVENOUS_CENTRAL

## 2023-03-08 NOTE — Progress Notes (Signed)
   03/08/23 1945  Assess: MEWS Score  Temp 97.7 F (36.5 C)  BP (!) 92/39  MAP (mmHg) (!) 55  Pulse Rate 66  ECG Heart Rate 66  Resp 12  SpO2 100 %  Assess: MEWS Score  MEWS Temp 0  MEWS Systolic 1  MEWS Pulse 0  MEWS RR 1  MEWS LOC 0  MEWS Score 2  MEWS Score Color Yellow  Assess: if the MEWS score is Yellow or Red  Were vital signs taken at a resting state? Yes  Focused Assessment No change from prior assessment  Does the patient meet 2 or more of the SIRS criteria? No  Does the patient have a confirmed or suspected source of infection? Yes  Provider and Rapid Response Notified? No  MEWS guidelines implemented  No, previously yellow, continue vital signs every 4 hours  Notify: Charge Nurse/RN  Name of Charge Nurse/RN Notified Volanda Napoleon, RN  Assess: SIRS CRITERIA  SIRS Temperature  0  SIRS Pulse 0  SIRS Respirations  0  SIRS WBC 0  SIRS Score Sum  0

## 2023-03-08 NOTE — Progress Notes (Signed)
ANTICOAGULATION CONSULT NOTE - Initial Consult Note  Pharmacy Consult for heparin Indication: chest pain/ACS  Allergies  Allergen Reactions   Penicillins Anaphylaxis    Has patient had a PCN reaction causing immediate rash, facial/tongue/throat swelling, SOB or lightheadedness with hypotension: Unknown Has patient had a PCN reaction causing severe rash involving mucus membranes or skin necrosis: Unknown Has patient had a PCN reaction that required hospitalization: Unknown Has patient had a PCN reaction occurring within the last 10 years: No If all of the above answers are "NO", then may proceed with Cephalosporin use.   Pepcid [Famotidine] Other (See Comments)   Prilosec [Omeprazole] Other (See Comments)    Patient Measurements: Height:  (165.1 cm) Weight: 63.8 kg (140 lb 10.5 oz) IBW/kg (Calculated) : 61.5 Heparin Dosing Weight: 63.5 kg  Vital Signs: Temp: 97.9 F (36.6 C) (04/21 0900) Temp Source: Oral (04/21 0900) BP: 99/79 (04/21 1345) Pulse Rate: 77 (04/21 1345)  Labs: Recent Labs    03/06/23 0750 03/06/23 1138 03/06/23 1322 03/07/23 0552 03/08/23 0323 03/08/23 1244  HGB 11.4*  --   --  9.7* 9.8*  --   HCT 37.5*  --   --  30.5* 32.3*  --   PLT 233  --   --  185 205  --   CREATININE 11.56*  --   --  13.89* 9.19*  --   TROPONINIHS  --  89* 108*  --   --  5,152*    Estimated Creatinine Clearance: 7.3 mL/min (A) (by C-G formula based on SCr of 9.19 mg/dL (H)).   Assessment: Raiquan Chandler presents with culture negative endocarditis. He has been on eliquis while inpatient for afib, last dose 4/18 ~2100. Dose had been held in anticipation of [facility] placement, which was delayed multiple times. He underwent TDC placement successfully last night. Will plan to start heparin infusion now but will avoid bolus due to recent [facility] placement. Will measure aPTT and anti-Xa since Eliquis taken within the last 3 days   Goal of Therapy:  Heparin level 0.3-0.7 units/ml Monitor  platelets by anticoagulation protocol: Yes   Plan:  No bolus - recent TDC placement in IR  Start heparin infusion at 750 units/hr Check anti-Xa and aPTT level in 8 hours and daily while on heparin Continue to monitor H&H and platelets  Blane Ohara, PharmD  PGY1 Pharmacy Resident

## 2023-03-08 NOTE — Significant Event (Signed)
Rapid Response Event Note   Reason for Call :  10/10 CP  Initial Focused Assessment:  In bed, restless, yelling out stating "help me, my chest." Patient not following commands, not answering questions, though interacting with staff members present. Skin warm dry and appropriate for ethnicity. Intermittent tremors/jerking noted in upper extremities and face.   VS 93/60 (73) HR 79 RR 14 O2 96% 2L (baseline)  Interventions:  EKG NS bolus Nitro sl tab x1 MD to bedside  0.5mg  IV Dilaudid  Troponins   Plan of Care:  Patient now A&Ox4, less restless, tremors/jerking still noted. CP still present, though much improved. Call back for further needs.   Event Summary:  MD Notified: Hector Shade MD Call Time: 1232 Arrival Time: 1236 End Time: 1255  Cherre Huger Verda Cumins, RN

## 2023-03-08 NOTE — Progress Notes (Signed)
PROGRESS NOTE    [name]  NWG:[id number] DOB: 09-21-1961 DOA: 03/02/2023 PCP: [name], MD    Chief Complaint  Patient presents with   Chest Pain    Brief Narrative:   [name] was admitted to the hospital with the working diagnosis of aortic valve endocarditis.    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 CT surgery determined patient was not a surgical candidate.  Patient left the hospital AMA, 04/11, then was re-hospitalized 04/13 but left again AMA on the 04/14.  Admitted to [facility] on 04/15 and left AMA later the same day.  Patient was readmitted again 4/15, renal consulted where he resumed his dialysis, ID has been consulted as well, TDC was discontinued for line holiday, it was [facility] 4/17, given patient agitation, combativeness and noncompliance, did DC reinsertion has been aborted couple times, eventually he had temporarily HD catheter inserted 4/20 where he was dialyzed, hospital stay was significant for multiple complaints of chest pain, which appears to be noncardiac, full EKGs were obtained, nonacute, troponins were at baseline. -This afternoon patient had complaints of chest pain where his troponin was significantly elevated at 5124. Assessment & Plan:   Principal Problem:   Aortic valve endocarditis Active Problems:   ESRD on hemodialysis   CHF (congestive heart failure)   Hypertension   Human immunodeficiency virus (HIV) disease (HCC)   Coronary artery disease   Paroxysmal atrial fibrillation   Depression   Aortic valve endocarditis Vegetation on tip of TDC  - ID input greatly appreciated, continue antibiotic therapy with IV vancomycin and cefepime. - TDC continued today, plan for line holiday. -No  aortic root abscess.  EEG done at [facility] once reviewed by her ID -Line holiday, [facility] discontinued 4/17, couple attempts were aborted to reinsert [facility] due to noncompliance, eventually was reinserted 4/20, where had temp HD catheter placed   ESRD on hemodialysis Hyperkalemia. Hyponatremia -renal input  greatly appreciated, continue with HD, patient on line holiday, TDC was discontinued on 4/17, temp catheter reinserted 4/20, after couple abandoned attempts due to agitation and noncompliance    Coronary artery disease Chest pain -Status post 4 vessel CABG in 2022, and recent PCI /DES and SVG in February 2024 . -Continue with Plavix . -Aspirin has been discontinued given patient is on Eliquis and trying to avoid triple therapy . -On Eliquis, held 4/20 . -Patient during hospital stay complaining of recurrent episodes of constant chest pain, with cardiac workup nonrevealing with nonacute EKG, and troponins at baseline/non-ACS pattern . -Afternoon patient with another complaints of chest pain, but his troponin currently is much elevated at 5124, with concern of NSTEMI, chest pain much improved after receiving Dilaudid and sublingual nitro, he will be started on heparin drip as D/W cardiology.   CHF (congestive heart failure) No clinical signs of decompensation. Volume management with HD   Hypertension Continue blood pressure control with carvedilol, isosorbide, hydralazine and lisinopril. Patient on minoxidil 7,5 mg daily.    Human immunodeficiency virus (HIV) disease (HCC) Continue antiretroviral therapy. Management per ID   Paroxysmal atrial fibrillation Icthaband for anticoagulation, currently on hold due to recurrent oozing from [facility] removal site Rate control with carvedilol and amiodarone.    Depression Continue with amitriptyline and mirtazapine.   oozing  from [facility] removal site -Globin has been remained stable so far, required multiple dressing changes over last 24 hours due to  significant oozing, continue to monitor CBC closely, this has been held on 4/20   I have discussed with patient multiple times during this hospitalization that his history of noncompliance, refusal of care, and signing AMA multiple times from multiple facilities, puts his life at risk.   DVT prophylaxis: Eliquis on hold due to recurrent oozing from site of his [facility] discontinued catheter Code Status: (Full) Family Communication: Discussed with his best friend Purcell Mouton at  4/20 Disposition:   Status is: Inpatient    Consultants:  Renal ID cardiology   Subjective:  No complaints earlier this morning, early afternoon complaints of chest pain, much improved with Dilaudid and nitro, -TDC catheter yesterday has been canceled initially due to patient combativeness and temporary catheter was inserted later yesterday, he was dialyzed yesterday.  Objective: Vitals:   03/08/23 0900 03/08/23 1240 03/08/23 1253 03/08/23 1345  BP: (!) 154/112 93/60 (!) 125/109 99/79  Pulse: 79 79 76 77  Resp: Temp: 97.9 F (36.6 C)     TempSrc: Oral     SpO2: 94% 96% 100% 100%  Weight:      Height:        Intake/Output Summary (Last 24 hours) at 03/08/2023 1400 Last data filed at 03/08/2023 0933 Gross per 24 hour  Intake 10 ml  Output 2200 ml  Net -2190 ml     Filed Weights   03/03/23 1640 03/07/23 1500 03/07/23 1900  Weight: 62.9 kg 66 kg 63.8 kg    Examination:    Awake Alert, Oriented X 3, he was seen multiple times today, initially comfortable this morning, but upon reevaluation of the afternoon he has been uncomfortable, planing of chest pain, Symmetrical Chest wall movement, Good air movement bilaterally, CTAB RRR,No Gallops,Rubs or new Murmurs, No Parasternal Heave +ve B.Sounds, Abd Soft, No tenderness, No rebound - guarding or rigidity. No Cyanosis, Clubbing or edema, No new Rash or bruise    Right Temp HD catheter   Data Reviewed: I have personally reviewed  following labs and imaging studies  CBC: Recent Labs  Lab 03/01/23 1730 03/02/23 0415 03/03/23 0409 03/04/23 0910 03/06/23 0750 03/07/23 0552 03/08/23 0323  WBC 6.7   < > 6.5 6.9 7.3 6.4 6.2  NEUTROABS 3.3  --   --   --   --  3.2  --   HGB 12.5*   < > 12.0* 12.3* 11.4* 9.7* 9.8*  HCT 40.4   < > 40.6 39.6 37.5* 30.5* 32.3*  MCV 91.2   < > 91.4 89.0 90.1 88.7 91.2  PLT 219   < > 231 239 233 185 205   < > = values in this interval not displayed.    Basic Metabolic Panel: Recent Labs  Lab 03/02/23 0415 03/03/23 0409 03/04/23 0910 03/06/23 0750 03/06/23 2036 03/07/23 0552 03/08/23 0323  NA 134* 133* 137 134*  --  132* 135  K 5.5* 5.0 4.8 6.1* 6.2* 5.8* 4.6  CL 95* 93* 94* 92*  --  93* 93*  CO2 20* --  21* 26  GLUCOSE 77 111* 102* 90  --  106* 81  BUN 64* 42* 47* 72*  --  91* 45*  CREATININE 8.39* 6.91* 7.60* 11.56*  --  13.89* 9.19*  CALCIUM 8.4* 8.6* 8.6* 8.7*  --  8.4* 8.7*  PHOS 5.4*  --   --  7.5*  --  8.2* 5.8*    GFR: Estimated Creatinine Clearance: 7.3 mL/min (A) (by C-G formula based on SCr of 9.19 mg/dL (H)).  Liver Function Tests: Recent Labs  Lab 03/01/23 1730 03/01/23 2000 03/02/23 0415 03/03/23 0409 03/06/23 0750 03/07/23 0552 03/08/23 0323  AST 39 46*  --  30  --   --   --   ALT 24 25  --  24  --   --   --   ALKPHOS 76 83  --  76  --   --   --   BILITOT 1.0 0.9  --  0.5  --   --   --   PROT 8.2* 8.3*  --  8.2*  --   --   --   ALBUMIN 3.3* 3.4* 3.1* 3.4* 3.1* 3.1* 2.8*    CBG: Recent Labs  Lab 03/07/23 1032  GLUCAP 167*     Recent Results (from the past 240 hour(s))  Blood culture (routine x 2)     Status: None   Collection Time: 03/01/23  8:00 PM   Specimen: BLOOD  Result Value Ref Range Status   Specimen Description BLOOD LEFT ANTECUBITAL  Final   Special Requests   Final    BOTTLES DRAWN AEROBIC AND ANAEROBIC Blood Culture adequate volume   Culture   Final    NO GROWTH 5 DAYS Performed at [facility],  [address] [place], [place] [address]    Report Status 03/06/2023 FINAL  Final  Blood culture (routine x 2)     Status: None   Collection Time: 03/01/23  8:20 PM   Specimen: BLOOD RIGHT HAND  Result Value Ref Range Status   Specimen Description BLOOD RIGHT HAND  Final   Special Requests   Final    BOTTLES DRAWN AEROBIC AND ANAEROBIC Blood Culture adequate volume   Culture   Final    NO GROWTH 5 DAYS Performed at [facility], [address], [place], [place] [address]    Report Status 03/06/2023 FINAL  Final         Radiology Studies: IR Fluoro Guide CV Line Right  Result Date: 03/07/2023 INDICATION: Dialysis access EXAM: Placement of temporary hemodialysis catheter using ultrasound and fluoroscopic guidance MEDICATIONS: None ANESTHESIA/SEDATION: Local analgesia FLUOROSCOPY TIME:  Fluoroscopy Time: 1.4 minutes (6 mGy) COMPLICATIONS: None immediate. PROCEDURE: Informed written consent was obtained from the patient after a thorough discussion of the procedural risks, benefits and alternatives. All questions were addressed. Maximal Sterile Barrier Technique was utilized including caps, mask, sterile gowns, sterile gloves, sterile drape, hand hygiene and skin antiseptic. A timeout was performed prior to the initiation of the procedure. The patient was placed supine on the exam table. The right neck and chest was prepped and draped in the standard sterile fashion. Ultrasound was used to evaluate the right internal jugular vein, which was found to be widely patent. A permanent image was stored in the electronic medical record. Using ultrasound guidance, the right internal jugular vein was directly punctured using a 21 gauge micropuncture set. Access site was serially dilated to accommodate an 035 wire, which was advanced into the IVC. Subsequently, serial tract dilation was performed and a 13 French temporary triple-lumen hemodialysis catheter was advanced into the central veins, such that  the tip overlies the superior cavoatrial junction. The catheter was found to aspirate and flush appropriately. It was locked with the appropriate volume of heparinized saline. It was secured to the skin using  silk suture and a sterile dressing. The patient tolerated the procedure well without immediate complication. IMPRESSION: Successful placement of a temporary triple-lumen hemodialysis catheter via the right internal jugular vein. The line is ready for immediate use. The line may be converted to a tunneled hemodialysis catheter at a later time when appropriate. Electronically Signed   By: Olive Bass M.D.   On: 03/07/2023 15:18   IR US Guide Vasc Access Right  Result Date: 03/07/2023 INDICATION: Dialysis access EXAM: Placement of temporary hemodialysis catheter using ultrasound and fluoroscopic guidance MEDICATIONS: None ANESTHESIA/SEDATION: Local analgesia FLUOROSCOPY TIME:  Fluoroscopy Time: 1.4 minutes (6 mGy) COMPLICATIONS: None immediate. PROCEDURE: Informed written consent was obtained from the patient after a thorough discussion of the procedural risks, benefits and alternatives. All questions were addressed. Maximal Sterile Barrier Technique was utilized including caps, mask, sterile gowns, sterile gloves, sterile drape, hand hygiene and skin antiseptic. A timeout was performed prior to the initiation of the procedure. The patient was placed supine on the exam table. The right neck and chest was prepped and draped in the standard sterile fashion. Ultrasound was used to evaluate the right internal jugular vein, which was found to be widely patent. A permanent image was stored in the electronic medical record. Using ultrasound guidance, the right internal jugular vein was directly punctured using a 21 gauge micropuncture set. Access site was serially dilated to accommodate an 035 wire, which was advanced into the IVC. Subsequently, serial tract dilation was performed and a 13 French temporary  triple-lumen hemodialysis catheter was advanced into the central veins, such that the tip overlies the superior cavoatrial junction. The catheter was found to aspirate and flush appropriately. It was locked with the appropriate volume of heparinized saline. It was secured to the skin using silk suture and a sterile dressing. The patient tolerated the procedure well without immediate complication. IMPRESSION: Successful placement of a temporary triple-lumen hemodialysis catheter via the right internal jugular vein. The line is ready for immediate use. The line may be converted to a tunneled hemodialysis catheter at a later time when appropriate. Electronically Signed   By: Olive Bass M.D.   On: 03/07/2023 15:18        Scheduled Meds:  amiodarone  200 mg Oral Daily   amitriptyline  10 mg Oral QHS   atorvastatin  80 mg Oral Daily   carvedilol  25 mg Oral BID WC   Chlorhexidine Gluconate Cloth  6 each Topical Daily   clopidogrel  75 mg Oral Daily   dapsone  100 mg Oral Daily   dolutegravir-rilpivirine  1 tablet Oral Q lunch   hydrALAZINE  100 mg Oral TID   isosorbide mononitrate  60 mg Oral Daily   lisinopril  10 mg Oral Daily   minoxidil  10 mg Oral BID   mirtazapine  7.5 mg Oral QHS   mupirocin ointment   Topical BID   sevelamer carbonate  2,400 mg Oral TID WC   sodium chloride flush  10-40 mL Intracatheter Q12H   vancomycin variable dose per unstable renal function (pharmacist dosing)   Does not apply See admin instructions   Continuous Infusions:  ceFEPime (MAXIPIME) IV 1 g (03/07/23 2248)   vancomycin       LOS: 6 days      Huey Bienenstock, MD [facility]   To contact the attending provider between 7A-7P or the covering provider during after hours 7P-7A, please log into the web site [email or web address] and access using universal [place] password  for that web site. If you do not have the password, please call the hospital operator.  03/08/2023, 2:00 PM

## 2023-03-08 NOTE — Progress Notes (Addendum)
[facility] Progress Note   Subjective:    Seen and examined patient at bedside. Patient became agitated again before going to IR but then was agreeable to proceed with procedure after his friend arrived. S/p R temp HD catheter placement yesterday in IR and he received HD afterwards. Tolerated net UF 2.2L. K+ improved-now 4.6 and Bps variable. He remains lethargic but reports mild SOB today. He is not in acute distress or appear severely overloaded. Given he only received 2 txs this week, plan to schedule a short HD tomorrow then he can resume HD TTS per his usual schedule. Will also request IR to transition to [facility] for sometime this week.   Objective Vitals:   03/07/23 2000 03/07/23 2257 03/08/23 0537 03/08/23 0900  BP: (!) 106/52 (!) 102/46 100/72 (!) 154/112  Pulse: 82 79 67 79  Resp: Temp: 98.4 F (36.9 C) 98.1 F (36.7 C) 98.3 F (36.8 C) 97.9 F (36.6 C)  TempSrc: Oral Oral Oral Oral  SpO2: 93% 94% 95% 94%  Weight:      Height:       Physical Exam General: Well appearing man, NAD. On 2L [place]. Heart: RRR; no murmur Lungs: CTAB; no rales Abdomen: soft Extremities: no LE edema Neuro: Noted mild myoclonic jerking bilateral upper extremities Dialysis Access: R temp HD catheter (placed in IR 4/20)  Filed Weights   03/03/23 1640 03/07/23 1500 03/07/23 1900  Weight: 62.9 kg 66 kg 63.8 kg    Intake/Output Summary (Last 24 hours) at 03/08/2023 1236 Last data filed at 03/08/2023 0933 Gross per 24 hour  Intake 10 ml  Output 2200 ml  Net -2190 ml    Additional Objective Labs: Basic Metabolic Panel: Recent Labs  Lab 03/06/23 0750 03/06/23 2036 03/07/23 0552 03/08/23 0323  NA 134*  --  132* 135  K 6.1* 6.2* 5.8* 4.6  CL 92*  --  93* 93*  CO2 22  --  21* 26  GLUCOSE 90  --  106* 81  BUN 72*  --  91* 45*  CREATININE 11.56*  --  13.89* 9.19*  CALCIUM 8.7*  --  8.4* 8.7*  PHOS 7.5*  --  8.2* 5.8*   Liver Function Tests: Recent Labs  Lab  03/01/23 1730 03/01/23 2000 03/02/23 0415 03/03/23 0409 03/06/23 0750 03/07/23 0552 03/08/23 0323  AST 39 46*  --  30  --   --   --   ALT 24 25  --  24  --   --   --   ALKPHOS 76 83  --  76  --   --   --   BILITOT 1.0 0.9  --  0.5  --   --   --   PROT 8.2* 8.3*  --  8.2*  --   --   --   ALBUMIN 3.3* 3.4*   < > 3.4* 3.1* 3.1* 2.8*   < > = values in this interval not displayed.   No results for input(s): "LIPASE", "AMYLASE" in the last 168 hours. CBC: Recent Labs  Lab 03/01/23 1730 03/02/23 0415 03/03/23 0409 03/04/23 0910 03/06/23 0750 03/07/23 0552 03/08/23 0323  WBC 6.7   < > 6.5 6.9 7.3 6.4 6.2  NEUTROABS 3.3  --   --   --   --  3.2  --   HGB 12.5*   < > 12.0* 12.3* 11.4* 9.7* 9.8*  HCT 40.4   < > 40.6 39.6 37.5* 30.5* 32.3*  MCV 91.2   < > 91.4 89.0 90.1 88.7 91.2  PLT 219   < > 231 239 233 185 205   < > = values in this interval not displayed.   Blood Culture    Component Value Date/Time   SDES BLOOD RIGHT HAND 03/01/2023 2020   SPECREQUEST  03/01/2023 2020    BOTTLES DRAWN AEROBIC AND ANAEROBIC Blood Culture adequate volume   CULT  03/01/2023 2020    NO GROWTH 5 DAYS Performed at [facility], [address], [place], [place] [address]    REPTSTATUS 03/06/2023 FINAL 03/01/2023 2020    Cardiac Enzymes: No results for input(s): "CKTOTAL", "CKMB", "CKMBINDEX", "TROPONINI" in the last 168 hours. CBG: Recent Labs  Lab 03/07/23 1032  GLUCAP 167*   Iron Studies: No results for input(s): "IRON", "TIBC", "TRANSFERRIN", "FERRITIN" in the last 72 hours. Lab Results  Component Value Date   INR 1.04 05/17/2018   INR 1.04 05/10/2018   INR 1.00 05/12/2012   Studies/Results: IR Fluoro Guide CV Line Right  Result Date: 03/07/2023 INDICATION: Dialysis access EXAM: Placement of temporary hemodialysis catheter using ultrasound and fluoroscopic guidance MEDICATIONS: None ANESTHESIA/SEDATION: Local analgesia FLUOROSCOPY TIME:  Fluoroscopy Time: 1.4 minutes (6  mGy) COMPLICATIONS: None immediate. PROCEDURE: Informed written consent was obtained from the patient after a thorough discussion of the procedural risks, benefits and alternatives. All questions were addressed. Maximal Sterile Barrier Technique was utilized including caps, mask, sterile gowns, sterile gloves, sterile drape, hand hygiene and skin antiseptic. A timeout was performed prior to the initiation of the procedure. The patient was placed supine on the exam table. The right neck and chest was prepped and draped in the standard sterile fashion. Ultrasound was used to evaluate the right internal jugular vein, which was found to be widely patent. A permanent image was stored in the electronic medical record. Using ultrasound guidance, the right internal jugular vein was directly punctured using a 21 gauge micropuncture set. Access site was serially dilated to accommodate an 035 wire, which was advanced into the IVC. Subsequently, serial tract dilation was performed and a 13 French temporary triple-lumen hemodialysis catheter was advanced into the central veins, such that the tip overlies the superior cavoatrial junction. The catheter was found to aspirate and flush appropriately. It was locked with the appropriate volume of heparinized saline. It was secured to the skin using silk suture and a sterile dressing. The patient tolerated the procedure well without immediate complication. IMPRESSION: Successful placement of a temporary triple-lumen hemodialysis catheter via the right internal jugular vein. The line is ready for immediate use. The line may be converted to a tunneled hemodialysis catheter at a later time when appropriate. Electronically Signed   By: Olive Bass M.D.   On: 03/07/2023 15:18   IR US Guide Vasc Access Right  Result Date: 03/07/2023 INDICATION: Dialysis access EXAM: Placement of temporary hemodialysis catheter using ultrasound and fluoroscopic guidance MEDICATIONS: None  ANESTHESIA/SEDATION: Local analgesia FLUOROSCOPY TIME:  Fluoroscopy Time: 1.4 minutes (6 mGy) COMPLICATIONS: None immediate. PROCEDURE: Informed written consent was obtained from the patient after a thorough discussion of the procedural risks, benefits and alternatives. All questions were addressed. Maximal Sterile Barrier Technique was utilized including caps, mask, sterile gowns, sterile gloves, sterile drape, hand hygiene and skin antiseptic. A timeout was performed prior to the initiation of the procedure. The patient was placed supine on the exam table. The right neck and chest was prepped and draped in the standard sterile fashion. Ultrasound was used  to evaluate the right internal jugular vein, which was found to be widely patent. A permanent image was stored in the electronic medical record. Using ultrasound guidance, the right internal jugular vein was directly punctured using a 21 gauge micropuncture set. Access site was serially dilated to accommodate an 035 wire, which was advanced into the IVC. Subsequently, serial tract dilation was performed and a 13 French temporary triple-lumen hemodialysis catheter was advanced into the central veins, such that the tip overlies the superior cavoatrial junction. The catheter was found to aspirate and flush appropriately. It was locked with the appropriate volume of heparinized saline. It was secured to the skin using silk suture and a sterile dressing. The patient tolerated the procedure well without immediate complication. IMPRESSION: Successful placement of a temporary triple-lumen hemodialysis catheter via the right internal jugular vein. The line is ready for immediate use. The line may be converted to a tunneled hemodialysis catheter at a later time when appropriate. Electronically Signed   By: Olive Bass M.D.   On: 03/07/2023 15:18    Medications:  ceFEPime (MAXIPIME) IV 1 g (03/07/23 2248)   sodium chloride     vancomycin      amiodarone  200 mg  Oral Daily   amitriptyline  10 mg Oral QHS   atorvastatin  80 mg Oral Daily   carvedilol  25 mg Oral BID WC   Chlorhexidine Gluconate Cloth  6 each Topical Daily   Chlorhexidine Gluconate Cloth  6 each Topical Q0600   clopidogrel  75 mg Oral Daily   dapsone  100 mg Oral Daily   dolutegravir-rilpivirine  1 tablet Oral Q lunch   hydrALAZINE  100 mg Oral TID   isosorbide mononitrate  60 mg Oral Daily   lisinopril  10 mg Oral Daily   minoxidil  10 mg Oral BID   mirtazapine  7.5 mg Oral QHS   mupirocin ointment   Topical BID   sevelamer carbonate  2,400 mg Oral TID WC   sodium chloride flush  10-40 mL Intracatheter Q12H   vancomycin variable dose per unstable renal function (pharmacist dosing)   Does not apply See admin instructions    Dialysis Orders: TTS [place]/ [place] 4h  420/ 800   68kg   2/2/ bath  Heparin ?  RIJ TDC  Assessment/Plan: AoV endocarditis - vegetation on tip of TDC: Blood Cx negative from early April at [facility]. TDC removed 4/17. On Vancomycin + Cefepime with HD x 6 week course. ESRD: Usual TTS schedule - Last HD 03/03/23. S/p R temp HD catheter placement in IR 4/20. Patient then received HD afterwards and 2.2L was removed. Will request IR to transition to [facility] for sometime this week. Plan for short HD 4/22 then again on 4/23 to place back on his routine schedule. K+ now 4.6. Monitor trend. HTN/volume: BP variable (he recently received some sedation d/t agitation), on multiple HTN meds. Close to euvolemic, well below prior EDW. Will lower EDW at discharge. Anemia of ESRD: Hgb is trending down, will obtain iron studies in AM. More likely will need to resume ESA. Secondary HPTH: Corr Ca ok and phos is trending down - continue sevelamer as binder. HIV: Continue home meds Hx perirectal boil  Salome Holmes, NP [facility] 03/08/2023,12:36 PM  LOS: 6 days    Pt seen, examined and agree w assess/plan as above with additions as indicated. IR  placed temp cath yesterday after patient settled down w/ the arrival of a friend. Temp  cath will need to be transitioned to [facility] this week. At least the line holiday has been completed.  Rob [facility] 03/08/2023, 6:55 PM

## 2023-03-08 NOTE — Progress Notes (Signed)
Rounding Note    Patient Name: [name] Date of Encounter: 03/08/2023  [facility] Cardiologist: Chrystie Nose, MD    Subjective   Reevaluation requested for chest pain and elevated troponins: [name] is a 62 y.o. male with a hx of HIV, hypertension, ESRD on HD, paroxysmal atrial fibrillation, CAD s/p 4v CABG with recent s/p stent SVG-rPDA 12/2022 and aortic valve endocarditis and  who is being seen for the evaluation of chest pain/abnormal EKG at the request of Dr. Randol Kern.  Asked to see patient again due to recurrent chest pain.  An EKG demonstrated sinus rhythm in lateral ST depressions unchanged from before.  Chest pain resolved with nitro SL and dilaudid.  Inpatient Medications    Scheduled Meds:  amiodarone  200 mg Oral Daily   amitriptyline  10 mg Oral QHS   atorvastatin  80 mg Oral Daily   carvedilol  25 mg Oral BID WC   Chlorhexidine Gluconate Cloth  6 each Topical Daily   clopidogrel  75 mg Oral Daily   dapsone  100 mg Oral Daily   dolutegravir-rilpivirine  1 tablet Oral Q lunch   hydrALAZINE  100 mg Oral TID   isosorbide mononitrate  60 mg Oral Daily   lisinopril  10 mg Oral Daily   minoxidil  10 mg Oral BID   mirtazapine  7.5 mg Oral QHS   mupirocin ointment   Topical BID   sevelamer carbonate  2,400 mg Oral TID WC   sodium chloride flush  10-40 mL Intracatheter Q12H   vancomycin variable dose per unstable renal function (pharmacist dosing)   Does not apply See admin instructions   Continuous Infusions:  ceFEPime (MAXIPIME) IV 1 g (03/07/23 2248)   heparin     vancomycin     PRN Meds: acetaminophen **OR** acetaminophen, albuterol, alteplase, heparin, HYDROmorphone (DILAUDID) injection, lidocaine (PF), lidocaine-prilocaine, LORazepam, nitroGLYCERIN, ondansetron **OR** ondansetron (ZOFRAN) IV, oxyCODONE, pentafluoroprop-tetrafluoroeth, senna-docusate, sevelamer carbonate, traMADol, traZODone   Vital Signs    Vitals:   03/08/23  0900 03/08/23 1240 03/08/23 1253 03/08/23 1345  BP: (!) 154/112 93/60 (!) 125/109 99/79  Pulse: 79 79 76 77  Resp: 20 12 14 17   Temp: 97.9 F (36.6 C)     TempSrc: Oral     SpO2: 94% 96% 100% 100%  Weight:      Height:        Intake/Output Summary (Last 24 hours) at 03/08/2023 1425 Last data filed at 03/08/2023 0933 Gross per 24 hour  Intake 10 ml  Output 2200 ml  Net -2190 ml      03/07/2023    7:00 PM 03/07/2023    3:00 PM 03/03/2023    4:40 PM  Last 3 Weights  Weight (lbs) 140 lb 10.5 oz 145 lb 8.1 oz 138 lb 10.7 oz  Weight (kg) 63.8 kg 66 kg 62.9 kg      Telemetry    SR - Personally Reviewed  ECG    None new- Personally Reviewed  Physical Exam   GEN: No acute distress.   HEENT:  MMM, no JVD, no scleral icterus Cardiac: RRR, no murmurs, rubs, or gallops.  Do not appreciate a murmur Respiratory: Clear to auscultation bilaterally. GI: Soft, nontender, non-distended  MS: No edema; No deformity. Neuro:  Nonfocal  Vasc:  +2 radial pulses; old LUE fistula  Labs    High Sensitivity Troponin:   Recent Labs  Lab 03/01/23 2000 03/04/23 0911 03/06/23 1138 03/06/23 1322 03/08/23 1244  TROPONINIHS 105* 89* 89* 108* 5,152*     Chemistry Recent Labs  Lab 03/01/23 1730 03/01/23 2000 03/01/23 2250 03/03/23 0409 03/04/23 0910 03/06/23 0750 03/06/23 2036 03/07/23 0552 03/08/23 0323  NA 134* 136  135   < > 133*   < > 134*  --  132* 135  K 6.9* 6.7*  6.5*   < > 5.0   < > 6.1* 6.2* 5.8* 4.6  CL 92* 94*  94*   < > 93*   < > 92*  --  93* 93*  CO2 24 23  26    < > 28   < > 22  --  21* 26  GLUCOSE 106* 104*  108*   < > 111*   < > 90  --  106* 81  BUN 60* 63*  62*   < > 42*   < > 72*  --  91* 45*  CREATININE 8.46* 8.75*  8.76*   < > 6.91*   < > 11.56*  --  13.89* 9.19*  CALCIUM 9.2 9.2  9.1   < > 8.6*   < > 8.7*  --  8.4* 8.7*  PROT 8.2* 8.3*  --  8.2*  --   --   --   --   --   ALBUMIN 3.3* 3.4*   < > 3.4*  --  3.1*  --  3.1* 2.8*  AST 39 46*  --  30  --    --   --   --   --   ALT 24 25  --  24  --   --   --   --   --   ALKPHOS 76 83  --  76  --   --   --   --   --   BILITOT 1.0 0.9  --  0.5  --   --   --   --   --   GFRNONAA 7* 6*  6*   < > 8*   < > 5*  --  4* 6*  ANIONGAP 18* 19*  15   < > 12   < > 20*  --  18* 16*   < > = values in this interval not displayed.    Lipids No results for input(s): "CHOL", "TRIG", "HDL", "LABVLDL", "LDLCALC", "CHOLHDL" in the last 168 hours.  Hematology Recent Labs  Lab 03/06/23 0750 03/07/23 0552 03/08/23 0323  WBC 7.3 6.4 6.2  RBC 4.16* 3.44* 3.54*  HGB 11.4* 9.7* 9.8*  HCT 37.5* 30.5* 32.3*  MCV 90.1 88.7 91.2  MCH 27.4 28.2 27.7  MCHC 30.4 31.8 30.3  RDW 17.6* 17.6* 17.2*  PLT 233 185 205   Thyroid No results for input(s): "TSH", "FREET4" in the last 168 hours.  BNPNo results for input(s): "BNP", "PROBNP" in the last 168 hours.  DDimer No results for input(s): "DDIMER" in the last 168 hours.   Radiology    IR Fluoro Guide CV Line Right  Result Date: 03/07/2023 INDICATION: Dialysis access EXAM: Placement of temporary hemodialysis catheter using ultrasound and fluoroscopic guidance MEDICATIONS: None ANESTHESIA/SEDATION: Local analgesia FLUOROSCOPY TIME:  Fluoroscopy Time: 1.4 minutes (6 mGy) COMPLICATIONS: None immediate. PROCEDURE: Informed written consent was obtained from the patient after a thorough discussion of the procedural risks, benefits and alternatives. All questions were addressed. Maximal Sterile Barrier Technique was utilized including caps, mask, sterile gowns, sterile gloves, sterile drape, hand hygiene and skin antiseptic. A timeout was performed prior to the initiation of  the procedure. The patient was placed supine on the exam table. The right neck and chest was prepped and draped in the standard sterile fashion. Ultrasound was used to evaluate the right internal jugular vein, which was found to be widely patent. A permanent image was stored in the electronic medical record. Using  ultrasound guidance, the right internal jugular vein was directly punctured using a 21 gauge micropuncture set. Access site was serially dilated to accommodate an 035 wire, which was advanced into the IVC. Subsequently, serial tract dilation was performed and a 13 French temporary triple-lumen hemodialysis catheter was advanced into the central veins, such that the tip overlies the superior cavoatrial junction. The catheter was found to aspirate and flush appropriately. It was locked with the appropriate volume of heparinized saline. It was secured to the skin using silk suture and a sterile dressing. The patient tolerated the procedure well without immediate complication. IMPRESSION: Successful placement of a temporary triple-lumen hemodialysis catheter via the right internal jugular vein. The line is ready for immediate use. The line may be converted to a tunneled hemodialysis catheter at a later time when appropriate. Electronically Signed   By: Olive Bass M.D.   On: 03/07/2023 15:18   IR US Guide Vasc Access Right  Result Date: 03/07/2023 INDICATION: Dialysis access EXAM: Placement of temporary hemodialysis catheter using ultrasound and fluoroscopic guidance MEDICATIONS: None ANESTHESIA/SEDATION: Local analgesia FLUOROSCOPY TIME:  Fluoroscopy Time: 1.4 minutes (6 mGy) COMPLICATIONS: None immediate. PROCEDURE: Informed written consent was obtained from the patient after a thorough discussion of the procedural risks, benefits and alternatives. All questions were addressed. Maximal Sterile Barrier Technique was utilized including caps, mask, sterile gowns, sterile gloves, sterile drape, hand hygiene and skin antiseptic. A timeout was performed prior to the initiation of the procedure. The patient was placed supine on the exam table. The right neck and chest was prepped and draped in the standard sterile fashion. Ultrasound was used to evaluate the right internal jugular vein, which was found to be widely  patent. A permanent image was stored in the electronic medical record. Using ultrasound guidance, the right internal jugular vein was directly punctured using a 21 gauge micropuncture set. Access site was serially dilated to accommodate an 035 wire, which was advanced into the IVC. Subsequently, serial tract dilation was performed and a 13 French temporary triple-lumen hemodialysis catheter was advanced into the central veins, such that the tip overlies the superior cavoatrial junction. The catheter was found to aspirate and flush appropriately. It was locked with the appropriate volume of heparinized saline. It was secured to the skin using silk suture and a sterile dressing. The patient tolerated the procedure well without immediate complication. IMPRESSION: Successful placement of a temporary triple-lumen hemodialysis catheter via the right internal jugular vein. The line is ready for immediate use. The line may be converted to a tunneled hemodialysis catheter at a later time when appropriate. Electronically Signed   By: Olive Bass M.D.   On: 03/07/2023 15:18    Cardiac Studies   Echo 02/2023 ([facility])   MILD LV DYSFUNCTION (See above) WITH MILD LVH    MILD RV SYSTOLIC DYSFUNCTION (See above)    VALVULAR REGURGITATION: SEVERE AR, MILD MR, TRIVIAL PR, MILD TR    VALVULAR STENOSIS: MODERATE AS    --SEVERE DIFFUSE THICKENING OF AORTIC VALVE WITH SMALL, MOBILE TARGET SEEN    ON RIGHT CORONARY CUSP; ECHODENSE TISSUE AROUND AORTIC VALVE IS    CONCERNING FOR AORTIC ABSCESS.    --ASSOCIATED SEVERE  AR    --SMALL OSCILATING MASS ON MV, DEGENERATIVE CHANGES VS. VEGETATION   Patient Profile     [name] is a 62 y.o. male with a hx of HIV, hypertension, ESRD on HD, paroxysmal atrial fibrillation, CAD s/p 4v CABG with recent s/p stent SVG-rPDA 12/2022 and aortic valve endocarditis who is being seen for the evaluation of chest pain/abnormal EKG at the request of Dr. Randol Kern.  Assessment & Plan     Principal Problem:   Aortic valve endocarditis Active Problems:   Human immunodeficiency virus (HIV) disease (HCC)   Hypertension   CHF (congestive heart failure)   ESRD on hemodialysis   Paroxysmal atrial fibrillation   Depression   Coronary artery disease  Chest pain CAD s/p 4v CABG 2022 (LIMA-LAD, RIMA T graft-RI/OM1, SVG-PDA) Recent PCI/DES to SVG-rPDA 12/2022  NSTEMI Patient has had basically constant chest pain since his admission.  His EKG shows ST depressions laterally which is unchanged from prior.  Per prior consulting team (interventional cardiology), threshold to cath would be positive troponins.  Patient's troponins are now 5000 with episodic chest pain.  EKG is poor quality but appears grossly unchanged with lateral ST depressions and T wave abnormality.  Known coronary artery disease as above, but atypical presentation with constant chest pain.  Compelling features are that chest pain is responsive to nitro and troponin did rise this admission.  I discussed with the patient that we will consider cardiac catheterization with interventional service tomorrow, however consideration for the patient's known aortic valve endocarditis must be taken into account prior to proceeding with left heart catheterization.  IC to discuss and determine tomorrow. -Agree with IV heparin as initiated by primary service.  Aortic Valve Endocarditis HD catheter infection Per ID note, no aortic root abscess based on [facility] TEE and a recent CT scan.  I am unable to independently review the images from either of these studies.  We may want to consider a repeat echocardiogram at our institution in light of chest pain.   ESRD on HD Hyperkalemia   HTN Continue Coreg 25 mg twice daily, hydralazine 100 mg 3 times daily, Imdur 60 mg daily, lisinopril 10 mg daily   Paroxysmal Afib -- In sinus rhythm -- Continue amiodarone 200 mg daily, Eliquis held and on IV heparin.  Would continue to hold Eliquis until we  decide about coronary angiography   HIV -Treatment per ID, see recent note. Depression    For questions or updates, please contact [facility] Please consult [email or web address] for contact info under        Signed, Parke Poisson, MD  03/08/2023, 2:25 PM

## 2023-03-08 NOTE — Progress Notes (Signed)
Pharmacy Antibiotic Note  [name] is a 62 y.o. male  with culture negative endocarditis.  Pharmacy has been consulted for Cefepime + Vancomycin dosing.  Patient went for [facility] placement 4/20 and completed a HD session of ~3 hours (per RN note) at a BFR of 400 per documentation. Patient does not have standing vancomycin doses at this time.   4/19 pre-HD vancomycin level 28; Vanc random this 4/21 AM resulting at 16, normal range 15-25. Next dialysis session anticipated to be Monday.    Plan: - Give Vancomycin 500 mg x 1 to increase post-HD level to closer to ~25 mcg/mL  - Continue Cefepime 1g IV Q24H  - Will continue to follow HD schedule/duration, culture results, LOT, and antibiotic de-escalation plans   Height:  (165.1 cm) Weight: 63.8 kg (140 lb 10.5 oz) IBW/kg (Calculated) : 61.5  Temp (24hrs), Avg:98 F (36.7 C), Min:97.5 F (36.4 C), Max:98.4 F (36.9 C)  Recent Labs  Lab 03/03/23 0409 03/04/23 0909 03/04/23 0910 03/06/23 0750 03/06/23 1138 03/07/23 0552 03/08/23 0323  WBC 6.5  --  6.9 7.3  --  6.4 6.2  CREATININE 6.91*  --  7.60* 11.56*  --  13.89* 9.19*  VANCORANDOM  --    < >  --   --  28  --  16   < > = values in this interval not displayed.     Estimated Creatinine Clearance: 7.3 mL/min (A) (by C-G formula based on SCr of 9.19 mg/dL (H)).    Allergies  Allergen Reactions   Penicillins Anaphylaxis    Has patient had a PCN reaction causing immediate rash, facial/tongue/throat swelling, SOB or lightheadedness with hypotension: Unknown Has patient had a PCN reaction causing severe rash involving mucus membranes or skin necrosis: Unknown Has patient had a PCN reaction that required hospitalization: Unknown Has patient had a PCN reaction occurring within the last 10 years: No If all of the above answers are "NO", then may proceed with Cephalosporin use.   Pepcid [Famotidine] Other (See Comments)   Prilosec [Omeprazole] Other (See Comments)     Antimicrobials this admission: Vanc + CTX PTA at [facility] Rocephin 4/16 >> 4/17 Cefepime 4/17 x 1 Vanc 4/16 >>  Dose adjustments this admission: 4/14 VR on admit after being at [facility] = 16 mcg/ml 4/17 VR 39 mcg/ml (from infiltrated arm so falsely high but still likely >15 mcg/ml) 4/21 VR 16 mcg/ml  Microbiology results: 4/14 BCx >> ngtd 4/17 BCx (ordered)  Thank you for allowing pharmacy to be a part of this patient's care.  Blane Ohara, PharmD  PGY1 Pharmacy Resident

## 2023-03-08 NOTE — Progress Notes (Signed)
At 1200 pm, IV nurse told primary RN Leavy Cella that pt endorsed chest pain. This Clinical research associate assessed pt, and pt endorsed 10/10 chest pain, and was clutching his chest. After several attempts, BP was 81/60, and then trended down to SBP 60s. Attending physician Dr. Randol Kern, charge RN Delicia and rapid response RN Dahlia Client were made aware and came to bedside. Dr. Randol Kern order NS fluid bolus 500 mL however, rapid response nurse administered 250 mL due to pt being on dialysis. SBP improved to 90s, and pt received Nitro SL 0.4 at 1242, followed by IV Dilaudid 0.5 mg at 1253. Troponin lab were ordered and sent as well as EKG performed. At 1300, pt remains alert and oriented x4, and chest pain is now 8/10. This Clinical research associate will continue to monitor pt.

## 2023-03-09 DIAGNOSIS — N186 End stage renal disease: Secondary | ICD-10-CM | POA: Diagnosis not present

## 2023-03-09 DIAGNOSIS — I214 Non-ST elevation (NSTEMI) myocardial infarction: Secondary | ICD-10-CM | POA: Diagnosis not present

## 2023-03-09 DIAGNOSIS — I358 Other nonrheumatic aortic valve disorders: Secondary | ICD-10-CM | POA: Diagnosis not present

## 2023-03-09 DIAGNOSIS — Z992 Dependence on renal dialysis: Secondary | ICD-10-CM | POA: Diagnosis not present

## 2023-03-09 LAB — CBC WITH DIFFERENTIAL/PLATELET
Abs Immature Granulocytes: 0.03 10*3/uL (ref 0.00–0.07)
Basophils Absolute: 0.1 10*3/uL (ref 0.0–0.1)
Basophils Relative: 1 %
Eosinophils Absolute: 0.4 10*3/uL (ref 0.0–0.5)
Eosinophils Relative: 5 %
HCT: 33.8 % — ABNORMAL LOW (ref 39.0–52.0)
Hemoglobin: 10.5 g/dL — ABNORMAL LOW (ref 13.0–17.0)
Immature Granulocytes: 0 %
Lymphocytes Relative: 30 %
Lymphs Abs: 2.2 10*3/uL (ref 0.7–4.0)
MCH: 27.8 pg (ref 26.0–34.0)
MCHC: 31.1 g/dL (ref 30.0–36.0)
MCV: 89.4 fL (ref 80.0–100.0)
Monocytes Absolute: 1.2 10*3/uL — ABNORMAL HIGH (ref 0.1–1.0)
Monocytes Relative: 16 %
Neutro Abs: 3.6 10*3/uL (ref 1.7–7.7)
Neutrophils Relative %: 48 %
Platelets: 217 10*3/uL (ref 150–400)
RBC: 3.78 MIL/uL — ABNORMAL LOW (ref 4.22–5.81)
RDW: 17.1 % — ABNORMAL HIGH (ref 11.5–15.5)
WBC: 7.4 10*3/uL (ref 4.0–10.5)
nRBC: 0 % (ref 0.0–0.2)

## 2023-03-09 LAB — BASIC METABOLIC PANEL
Anion gap: 16 — ABNORMAL HIGH (ref 5–15)
BUN: 70 mg/dL — ABNORMAL HIGH (ref 8–23)
CO2: 23 mmol/L (ref 22–32)
Calcium: 8.6 mg/dL — ABNORMAL LOW (ref 8.9–10.3)
Chloride: 95 mmol/L — ABNORMAL LOW (ref 98–111)
Creatinine, Ser: 11.39 mg/dL — ABNORMAL HIGH (ref 0.61–1.24)
GFR, Estimated: 5 mL/min — ABNORMAL LOW (ref >60)
Glucose, Bld: 94 mg/dL (ref 70–99)
Potassium: 5.2 mmol/L — ABNORMAL HIGH (ref 3.5–5.1)
Sodium: 134 mmol/L — ABNORMAL LOW (ref 135–145)

## 2023-03-09 LAB — APTT
aPTT: 46 seconds — ABNORMAL HIGH (ref 24–36)
aPTT: 77 seconds — ABNORMAL HIGH (ref 24–36)

## 2023-03-09 MED ORDER — LORAZEPAM 2 MG/ML IJ SOLN
INTRAMUSCULAR | Status: AC
  Filled 2023-03-09: qty 1

## 2023-03-09 MED ORDER — CARVEDILOL 12.5 MG PO TABS
12.5000 mg | ORAL_TABLET | Freq: Two times a day (BID) | ORAL | Status: DC
  Administered 2023-03-10 – 2023-03-11 (×3): 12.5 mg via ORAL
  Filled 2023-03-09 (×3): qty 1

## 2023-03-09 MED ORDER — VANCOMYCIN HCL 750 MG/150ML IV SOLN
750.0000 mg | INTRAVENOUS | Status: AC
  Administered 2023-03-09: 750 mg via INTRAVENOUS
  Filled 2023-03-09: qty 150

## 2023-03-09 MED ORDER — LORAZEPAM 2 MG/ML IJ SOLN
2.0000 mg | Freq: Once | INTRAMUSCULAR | Status: AC
  Administered 2023-03-09: 2 mg via INTRAVENOUS

## 2023-03-09 MED ORDER — HEPARIN SODIUM (PORCINE) 1000 UNIT/ML IJ SOLN
INTRAMUSCULAR | Status: AC
  Filled 2023-03-09: qty 3

## 2023-03-09 MED ORDER — MIDODRINE HCL 5 MG PO TABS
10.0000 mg | ORAL_TABLET | Freq: Three times a day (TID) | ORAL | Status: DC
  Administered 2023-03-09 – 2023-03-12 (×11): 10 mg via ORAL
  Filled 2023-03-09 (×10): qty 2

## 2023-03-09 MED ORDER — ISOSORBIDE MONONITRATE ER 30 MG PO TB24
30.0000 mg | ORAL_TABLET | Freq: Every day | ORAL | Status: DC
  Administered 2023-03-10 – 2023-03-12 (×3): 30 mg via ORAL
  Filled 2023-03-09 (×3): qty 1

## 2023-03-09 NOTE — Progress Notes (Addendum)
ANTICOAGULATION CONSULT NOTE   Pharmacy Consult for heparin Indication: chest pain/ACS  Allergies  Allergen Reactions   Penicillins Anaphylaxis    Has patient had a PCN reaction causing immediate rash, facial/tongue/throat swelling, SOB or lightheadedness with hypotension: Unknown Has patient had a PCN reaction causing severe rash involving mucus membranes or skin necrosis: Unknown Has patient had a PCN reaction that required hospitalization: Unknown Has patient had a PCN reaction occurring within the last 10 years: No If all of the above answers are "NO", then may proceed with Cephalosporin use.   Pepcid [Famotidine] Other (See Comments)   Prilosec [Omeprazole] Other (See Comments)    Patient Measurements: Height:  (165.1 cm) Weight: 63.7 kg (140 lb 6.9 oz) IBW/kg (Calculated) : 61.5 Heparin Dosing Weight: 63.5 kg  Vital Signs: Temp: 97.6 F (36.4 C) (04/22 1244) Temp Source: Axillary (04/22 1244) BP: 109/50 (04/22 1238) Pulse Rate: 79 (04/22 1244)  Labs: Recent Labs    03/07/23 0552 03/08/23 0323 03/08/23 1244 03/08/23 1446 03/08/23 2310 03/09/23 0734 03/09/23 1425  HGB 9.7* 9.8*  --   --   --  10.5*  --   HCT 30.5* 32.3*  --   --   --  33.8*  --   PLT 185 205  --   --   --  217  --   APTT  --   --   --   --  46*  --  77*  HEPARINUNFRC  --   --   --   --  0.68  --   --   CREATININE 13.89* 9.19*  --   --   --  11.39*  --   TROPONINIHS  --   --  5,152* 1,610*  --   --   --      Estimated Creatinine Clearance: 5.9 mL/min (A) (by C-G formula based on SCr of 11.39 mg/dL (H)).   Assessment: [name] presents with culture negative endocarditis. He has been on eliquis while inpatient for afib, last dose 4/18 ~2100. Dose had been held in anticipation of [facility] placement, which was delayed multiple times. He underwent TDC placement successfully last night. Will plan to start heparin infusion now but will avoid bolus due to recent [facility] placement. Will measure aPTT  and anti-Xa since Eliquis taken within the last 3 days   Heparin level this evening likely a little falsely elevated at 0.68 with recent apixaban.  APTT below goal at 46 seconds.  No known issue with IV infusion, no overt bleeding or complications noted.  4/22 PM update: aPTT 77 seconds- therapeutic No signs of bleeding or issues with the heparin infusion   Goal of Therapy:  Heparin level 0.3-0.7 units/ml aPTT 66-102 seconds Monitor platelets by anticoagulation protocol: Yes   Plan:  Continue IV heparin to 900 units/hr Repeat aPTT and heparin level with AM labs Daily CBC.  [name] BS, PharmD, BCPS Clinical Pharmacist 03/09/2023 3:26 PM  Contact: [phone] after 3 PM  "Be curious, not judgmental..." -Debbora Dus

## 2023-03-09 NOTE — Progress Notes (Signed)
Received a call from Floral in dialysis for report.  She asked this RN to give 8 am dose of midodrine.  Pulled med and attempted to administer and patient refused stating "leave me alone".  Patient is educated and he verbalized understanding. In bed resting.  Will continue to monitor

## 2023-03-09 NOTE — Progress Notes (Signed)
ANTICOAGULATION CONSULT NOTE   Pharmacy Consult for heparin Indication: chest pain/ACS  Allergies  Allergen Reactions   Penicillins Anaphylaxis    Has patient had a PCN reaction causing immediate rash, facial/tongue/throat swelling, SOB or lightheadedness with hypotension: Unknown Has patient had a PCN reaction causing severe rash involving mucus membranes or skin necrosis: Unknown Has patient had a PCN reaction that required hospitalization: Unknown Has patient had a PCN reaction occurring within the last 10 years: No If all of the above answers are "NO", then may proceed with Cephalosporin use.   Pepcid [Famotidine] Other (See Comments)   Prilosec [Omeprazole] Other (See Comments)    Patient Measurements: Height:  (165.1 cm) Weight: 63.8 kg (140 lb 10.5 oz) IBW/kg (Calculated) : 61.5 Heparin Dosing Weight: 63.5 kg  Vital Signs: Temp: 98.5 F (36.9 C) (04/21 2046) Temp Source: Oral (04/21 2046) BP: 95/49 (04/21 2046) Pulse Rate: 79 (04/21 2046)  Labs: Recent Labs    03/06/23 0750 03/06/23 1138 03/06/23 1322 03/07/23 0552 03/08/23 0323 03/08/23 1244 03/08/23 1446 03/08/23 2310  HGB 11.4*  --   --  9.7* 9.8*  --   --   --   HCT 37.5*  --   --  30.5* 32.3*  --   --   --   PLT 233  --   --  185 205  --   --   --   APTT  --   --   --   --   --   --   --  46*  HEPARINUNFRC  --   --   --   --   --   --   --  0.68  CREATININE 11.56*  --   --  13.89* 9.19*  --   --   --   TROPONINIHS  --    < > 108*  --   --  5,152* 4,462*  --    < > = values in this interval not displayed.     Estimated Creatinine Clearance: 7.3 mL/min (A) (by C-G formula based on SCr of 9.19 mg/dL (H)).   Assessment: [name] presents with culture negative endocarditis. He has been on eliquis while inpatient for afib, last dose 4/18 ~2100. Dose had been held in anticipation of [facility] placement, which was delayed multiple times. He underwent TDC placement successfully last night. Will plan to  start heparin infusion now but will avoid bolus due to recent [facility] placement. Will measure aPTT and anti-Xa since Eliquis taken within the last 3 days   Heparin level this evening likely a little falsely elevated at 0.68 with recent apixaban.  APTT below goal at 46 seconds.  No known issue with IV infusion, no overt bleeding or complications noted.  Goal of Therapy:  Heparin level 0.3-0.7 units/ml Monitor platelets by anticoagulation protocol: Yes   Plan:  No bolus - recent TDC placement in IR  Increase IV heparin to 1000 units/hr Repeat heparin level in 8 hrs  Daily heparin level and CBC.  Reece Leader, Colon Flattery, [facility] Clinical Pharmacist  03/09/2023 12:33 AM   [facility] pharmacy phone numbers are listed on [email or web address]

## 2023-03-09 NOTE — Progress Notes (Addendum)
Subjective: Seen in hemodialysis room and some agitation not in distress asking for Dr. Johny Sax.  Ativan given to help with agitation  Objective Vital signs in last 24 hours: Vitals:   03/08/23 2046 03/08/23 2100 03/09/23 0041 03/09/23 0821  BP: (!) 95/49  (!) 90/44 (!) 150/65  Pulse: 79 79 76 98  Resp: 18  19 20   Temp: 98.5 F (36.9 C) 97.9 F (36.6 C)    TempSrc: Oral Oral  Oral  SpO2: 98% 98% 99% 93%  Weight:      Height:       Weight change:   Physical Exam: General: Chronically ill adult male, some agitation but not in distress Heart: Difficult to hear because of patient's constant voice but appears sinus on telemetry Lungs: CTA bilaterally anteriorly as able to hear with his constant speech Abdomen: NABS, soft NTND Extremities: No pedal edema  Dialysis Access: Right IJ temporary HD catheter (IR placed 4/20)  OP Dialysis Orders: TTS [place]/ [place] [facility] 4h  420/ 800   68kg   2/2/ bath  Heparin ?  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 TDC removed 4/17. On Vancomycin + Cefepime with HD x 6 week course. ESRD: Usual TTS schedule -off schedule secondary to the HD cath removed secondary to endocarditis HD today then back on schedule tomorrow Tuesday =last HD 03/03/23.//  R temp HD catheter placement in IR 4/20. Patient then received HD afterwards and 2.2L was removed. HAVE requestedIR to transition to [facility] for sometime this week. Plan for short HD  today 4/22 then again on 4/23 to place back on his routine schedule. K+ now 5.2 , CR 11.3 BUN 70. Monitor trend. HTN/volume: BP variable a lot of intermittent agitation, needing Ativan now to have dialysis (he recently received some sedation d/t agitation), was on multiple HTN meds.  Noted also on midodrine now in hospital, today elevated BP probably because of agitation this morning, appears close to euvolemic, well below prior EDW. Will lower  EDW at discharge.  Chest x-ray 4/14 no active lung disease Anemia of ESRD: This a.m. Hgb 10.5 <9.8 was  trending down, will obtain iron studies in AM.(However because of infection no IV iron yet) monitor Hgb trend to resume ESA as needed none currently Secondary HPTH: Corr Ca ok and phos 5.8  - continue sevelamer as binder. HIV: Continue home meds Hx perirectal boil   Lenny Pastel, PA-C [facility] Beeper [phone] 03/09/2023,9:35 AM  LOS: 7 days   Patient seen and examined, agree with above note with above modifications. AV endocarditis-  tdc removed 4/17-  temp cath placed 4/20-  HD today and then tomorrow to get back on schedule-  will need transition to [facility] at some point-  pt resting on HD Annie Sable, MD 03/09/2023    Labs: Basic Metabolic Panel: Recent Labs  Lab 03/06/23 0750 03/06/23 2036 03/07/23 0552 03/08/23 0323 03/09/23 0734  NA 134*  --  132* 135 134*  K 6.1*   < > 5.8* 4.6 5.2*  CL 92*  --  93* 93* 95*  CO2 22  --  21* 26 23  GLUCOSE 90  --  106* 81 94  BUN 72*  --  91* 45* 70*  CREATININE 11.56*  --  13.89* 9.19* 11.39*  CALCIUM 8.7*  --  8.4* 8.7* 8.6*  PHOS 7.5*  --  8.2* 5.8*  --    < > =  values in this interval not displayed.   Liver Function Tests: Recent Labs  Lab 03/03/23 0409 03/06/23 0750 03/07/23 0552 03/08/23 0323  AST 30  --   --   --   ALT 24  --   --   --   ALKPHOS 76  --   --   --   BILITOT 0.5  --   --   --   PROT 8.2*  --   --   --   ALBUMIN 3.4* 3.1* 3.1* 2.8*   No results for input(s): "LIPASE", "AMYLASE" in the last 168 hours. No results for input(s): "AMMONIA" in the last 168 hours. CBC: Recent Labs  Lab 03/04/23 0910 03/06/23 0750 03/07/23 0552 03/08/23 0323 03/09/23 0734  WBC 6.9 7.3 6.4 6.2 7.4  NEUTROABS  --   --  3.2  --  3.6  HGB 12.3* 11.4* 9.7* 9.8* 10.5*  HCT 39.6 37.5* 30.5* 32.3* 33.8*  MCV 89.0 90.1 88.7 91.2 89.4  PLT 239 233 185 205 217   Cardiac Enzymes: No results for  input(s): "CKTOTAL", "CKMB", "CKMBINDEX", "TROPONINI" in the last 168 hours. CBG: Recent Labs  Lab 03/07/23 1032  GLUCAP 167*    Studies/Results: IR Fluoro Guide CV Line Right  Result Date: 03/07/2023 INDICATION: Dialysis access EXAM: Placement of temporary hemodialysis catheter using ultrasound and fluoroscopic guidance MEDICATIONS: None ANESTHESIA/SEDATION: Local analgesia FLUOROSCOPY TIME:  Fluoroscopy Time: 1.4 minutes (6 mGy) COMPLICATIONS: None immediate. PROCEDURE: Informed written consent was obtained from the patient after a thorough discussion of the procedural risks, benefits and alternatives. All questions were addressed. Maximal Sterile Barrier Technique was utilized including caps, mask, sterile gowns, sterile gloves, sterile drape, hand hygiene and skin antiseptic. A timeout was performed prior to the initiation of the procedure. The patient was placed supine on the exam table. The right neck and chest was prepped and draped in the standard sterile fashion. Ultrasound was used to evaluate the right internal jugular vein, which was found to be widely patent. A permanent image was stored in the electronic medical record. Using ultrasound guidance, the right internal jugular vein was directly punctured using a 21 gauge micropuncture set. Access site was serially dilated to accommodate an 035 wire, which was advanced into the IVC. Subsequently, serial tract dilation was performed and a 13 French temporary triple-lumen hemodialysis catheter was advanced into the central veins, such that the tip overlies the superior cavoatrial junction. The catheter was found to aspirate and flush appropriately. It was locked with the appropriate volume of heparinized saline. It was secured to the skin using silk suture and a sterile dressing. The patient tolerated the procedure well without immediate complication. IMPRESSION: Successful placement of a temporary triple-lumen hemodialysis catheter via the right  internal jugular vein. The line is ready for immediate use. The line may be converted to a tunneled hemodialysis catheter at a later time when appropriate. Electronically Signed   By: Olive Bass M.D.   On: 03/07/2023 15:18   IR US Guide Vasc Access Right  Result Date: 03/07/2023 INDICATION: Dialysis access EXAM: Placement of temporary hemodialysis catheter using ultrasound and fluoroscopic guidance MEDICATIONS: None ANESTHESIA/SEDATION: Local analgesia FLUOROSCOPY TIME:  Fluoroscopy Time: 1.4 minutes (6 mGy) COMPLICATIONS: None immediate. PROCEDURE: Informed written consent was obtained from the patient after a thorough discussion of the procedural risks, benefits and alternatives. All questions were addressed. Maximal Sterile Barrier Technique was utilized including caps, mask, sterile gowns, sterile gloves, sterile drape, hand hygiene and skin antiseptic. A timeout was performed  prior to the initiation of the procedure. The patient was placed supine on the exam table. The right neck and chest was prepped and draped in the standard sterile fashion. Ultrasound was used to evaluate the right internal jugular vein, which was found to be widely patent. A permanent image was stored in the electronic medical record. Using ultrasound guidance, the right internal jugular vein was directly punctured using a 21 gauge micropuncture set. Access site was serially dilated to accommodate an 035 wire, which was advanced into the IVC. Subsequently, serial tract dilation was performed and a 13 French temporary triple-lumen hemodialysis catheter was advanced into the central veins, such that the tip overlies the superior cavoatrial junction. The catheter was found to aspirate and flush appropriately. It was locked with the appropriate volume of heparinized saline. It was secured to the skin using silk suture and a sterile dressing. The patient tolerated the procedure well without immediate complication. IMPRESSION: Successful  placement of a temporary triple-lumen hemodialysis catheter via the right internal jugular vein. The line is ready for immediate use. The line may be converted to a tunneled hemodialysis catheter at a later time when appropriate. Electronically Signed   By: Olive Bass M.D.   On: 03/07/2023 15:18   Medications:  ceFEPime (MAXIPIME) IV Stopped (03/08/23 2205)   heparin 900 Units/hr (03/09/23 0042)    amiodarone  200 mg Oral Daily   amitriptyline  10 mg Oral QHS   atorvastatin  80 mg Oral Daily   carvedilol  12.5 mg Oral BID WC   Chlorhexidine Gluconate Cloth  6 each Topical Daily   clopidogrel  75 mg Oral Daily   dapsone  100 mg Oral Daily   dolutegravir-rilpivirine  1 tablet Oral Q lunch   isosorbide mononitrate  30 mg Oral Daily   LORazepam       midodrine  10 mg Oral TID WC   mirtazapine  7.5 mg Oral QHS   mupirocin ointment   Topical BID   sevelamer carbonate  2,400 mg Oral TID WC   sodium chloride flush  10-40 mL Intracatheter Q12H   vancomycin variable dose per unstable renal function (pharmacist dosing)   Does not apply See admin instructions

## 2023-03-09 NOTE — TOC Transition Note (Signed)
Discharge medications (1) have been retrieved from Main Pharmacy weekend lockup and are now being stored in the Transitions of Care (TOC) Pharmacy on the second floor until patient is ready for discharge.   

## 2023-03-09 NOTE — Progress Notes (Signed)
Pharmacy Antibiotic Note  [name] is a 61 y.o. male  with culture negative endocarditis.  Pharmacy has been consulted for cefepime + vancomycin dosing. Pt has ESRD on HD usually TTS, currently off-schedule.  Pt had vancomycin random of 16 mcg/ml and was given  vancomycin so calculated level would be ~23 mcg/ml. Pt will need additional vancomycin dose assuming he tolerates 3-4 hours of HD.  Plan: -Vancomycin  IV x1 with HD today -Continue Cefepime 1g IV Q24H  -Will continue to follow HD schedule/duration, culture results, LOT, and antibiotic de-escalation plans   Height:  (165.1 cm) Weight: 63.8 kg (140 lb 10.5 oz) IBW/kg (Calculated) : 61.5  Temp (24hrs), Avg:98 F (36.7 C), Min:97.7 F (36.5 C), Max:98.5 F (36.9 C)  Recent Labs  Lab 03/04/23 0910 03/06/23 0750 03/06/23 1138 03/07/23 0552 03/08/23 0323 03/09/23 0734  WBC 6.9 7.3  --  6.4 6.2 7.4  CREATININE 7.60* 11.56*  --  13.89* 9.19* 11.39*  VANCORANDOM  --   --  28  --  16  --      Estimated Creatinine Clearance: 5.9 mL/min (A) (by C-G formula based on SCr of 11.39 mg/dL (H)).    Allergies  Allergen Reactions   Penicillins Anaphylaxis    Has patient had a PCN reaction causing immediate rash, facial/tongue/throat swelling, SOB or lightheadedness with hypotension: Unknown Has patient had a PCN reaction causing severe rash involving mucus membranes or skin necrosis: Unknown Has patient had a PCN reaction that required hospitalization: Unknown Has patient had a PCN reaction occurring within the last 10 years: No If all of the above answers are "NO", then may proceed with Cephalosporin use.   Pepcid [Famotidine] Other (See Comments)   Prilosec [Omeprazole] Other (See Comments)    Antimicrobials this admission: Vanc + CTX PTA at [facility] Rocephin 4/16 >> 4/17 Cefepime 4/17 >> Vanc 4/16 >>  Dose adjustments this admission: 4/14 VR on admit after being at [facility] = 16 mcg/ml 4/17 VR 39 mcg/ml (from  infiltrated arm so falsely high but still likely >15 mcg/ml) 4/21 VR 16 mcg/ml  Microbiology results: 4/14 BCx: negative   [name], PharmD, BCPS, [facility] Clinical Pharmacist [phone] Please check AMION for all [facility] numbers 03/09/2023

## 2023-03-09 NOTE — Progress Notes (Signed)
PROGRESS NOTE    KASHON KRAYNAK  WUX:[id number] DOB: 07/31/1961 DOA: 03/02/2023 PCP: [name], MD    Chief Complaint  Patient presents with   Chest Pain    Brief Narrative:   Mr. Milewski was admitted to the hospital with the working diagnosis of aortic valve endocarditis.    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 CT surgery determined patient was not a surgical candidate.  Patient left the hospital AMA, 04/11, then was re-hospitalized 04/13 but left again AMA on the 04/14.  Admitted to [facility] on 04/15 and left AMA later the same day.  Patient was readmitted again 4/15, renal consulted where he resumed his dialysis, ID has been consulted as well, TDC was discontinued for line holiday, it was [facility] 4/17, given patient agitation, combativeness and noncompliance, did DC reinsertion has been aborted couple times, eventually he had temporarily HD catheter inserted 4/20 where he was dialyzed, hospital stay was significant for multiple complaints of chest pain, which appears to be noncardiac, full EKGs were obtained, nonacute, troponins were at baseline. -This afternoon patient had complaints of chest pain where his troponin was significantly elevated at 5124. Assessment & Plan:   Principal Problem:   Aortic valve endocarditis Active Problems:   ESRD on hemodialysis   CHF (congestive heart failure)   Hypertension   Human immunodeficiency virus (HIV) disease (HCC)   Coronary artery disease   Paroxysmal atrial fibrillation   Depression   Aortic valve endocarditis Vegetation on tip of TDC  - ID input greatly appreciated, continue antibiotic therapy with IV vancomycin and cefepime. - TDC continued today, plan for line holiday. -No  aortic root abscess.  EEG done at [facility] once reviewed by her ID -Line holiday, [facility] discontinued 4/17, couple attempts were aborted to reinsert [facility] due to noncompliance, eventually was reinserted 4/20, where had temp HD catheter placed   ESRD on hemodialysis Hyperkalemia. Hyponatremia -renal input  greatly appreciated, continue with HD, patient on line holiday, TDC was discontinued on 4/17, temp catheter reinserted 4/20, after couple abandoned attempts due to agitation and noncompliance  -HD per renal   Coronary artery disease Chest pain  NSTEMI -Status post 4 vessel CABG in 2022, and recent PCI /DES and SVG in February 2024 . -Continue with Plavix . -Aspirin has been discontinued given patient is on Eliquis and trying to avoid triple therapy . -Eliquis held 4/20 due to oozing from [facility] site -Troponin significantly elevated bated at 5124, concerning for non-STEMI on 4/21, cardiology input greatly appreciated, will need cardiac cath when more stable, for now continue with heparin GTT    CHF (congestive heart failure) No clinical signs of decompensation. Volume management with HD   Hypertension -Blood pressure soft this morning, started on midodrine, will maximize dose, minoxidil, lisinopril, and hydralazine has been discontinued, will decrease carvedilol isosorbide dosing    Human immunodeficiency virus (HIV) disease (HCC) Continue antiretroviral therapy. Management per ID   Paroxysmal atrial fibrillation Icthaband for anticoagulation, currently on hold due to recurrent oozing from [facility] removal site Rate control with carvedilol and amiodarone.    Depression Continue with amitriptyline and mirtazapine.   oozing from [facility] removal site -Globin has been remained stable so far, required multiple dressing changes over last 24 hours due to  significant oozing, continue to monitor CBC closely, this has been held on 4/20   I have discussed with patient multiple times during this  hospitalization that his history of noncompliance, refusal of care, and signing AMA multiple times from multiple facilities, puts his life at risk.   DVT prophylaxis: heparin GTT Code Status: (Full) Family Communication: None at bedside, Ms Zachery Dauer  Disposition:   Status is: Inpatient    Consultants:  Renal ID cardiology   Subjective:  No significant events overnight as discussed with staff, still complaining of twitching Objective: Vitals:   03/09/23 1130 03/09/23 1236 03/09/23 1238 03/09/23 1244  BP: (!) 91/52  (!) 109/50   Pulse: 82  76 79  Resp: Temp:   98.4 F (36.9 C) 97.6 F (36.4 C)  TempSrc:    Axillary  SpO2: 97%  100% 97%  Weight:  63.7 kg    Height:        Intake/Output Summary (Last 24 hours) at 03/09/2023 1451 Last data filed at 03/09/2023 1238 Gross per 24 hour  Intake 494.2 ml  Output 1000 ml  Net -505.8 ml     Filed Weights   03/07/23 1500 03/07/23 1900 03/09/23 1236  Weight: 66 kg 63.8 kg 63.7 kg    Examination:   Awake Alert, no apparent distress, with intermittent generalized jerking movement Symmetrical Chest wall movement, Good air movement bilaterally, CTAB RRR,No Gallops,Rubs or new Murmurs, No Parasternal Heave +ve B.Sounds, Abd Soft, No tenderness, No rebound - guarding or rigidity. Right Temp HD catheter   Data Reviewed: I have personally reviewed following labs and imaging studies  CBC: Recent Labs  Lab 03/04/23 0910 03/06/23 0750 03/07/23 0552 03/08/23 0323 03/09/23 0734  WBC 6.9 7.3 6.4 6.2 7.4  NEUTROABS  --   --  3.2  --  3.6  HGB 12.3* 11.4* 9.7* 9.8* 10.5*  HCT 39.6 37.5* 30.5* 32.3* 33.8*  MCV 89.0 90.1 88.7 91.2 89.4  PLT 239 233 185 205 217    Basic Metabolic Panel: Recent Labs  Lab 03/04/23 0910 03/06/23 0750 03/06/23 2036 03/07/23 0552 03/08/23 0323 03/09/23 0734  NA 137 134*  --  132* 135 134*  K 4.8 6.1* 6.2* 5.8* 4.6 5.2*  CL 94* 92*  --  93* 93* 95*  CO2 26 22  --  21* 26 23   GLUCOSE 102* 90  --  106* 81 94  BUN 47* 72*  --  91* 45* 70*  CREATININE 7.60* 11.56*  --  13.89* 9.19* 11.39*  CALCIUM 8.6* 8.7*  --  8.4* 8.7* 8.6*  PHOS  --  7.5*  --  8.2* 5.8*  --     GFR: Estimated Creatinine Clearance: 5.9 mL/min (A) (by C-G formula based on SCr of 11.39 mg/dL (H)).  Liver Function Tests: Recent Labs  Lab 03/03/23 0409 03/06/23 0750 03/07/23 0552 03/08/23 0323  AST 30  --   --   --   ALT 24  --   --   --   ALKPHOS 76  --   --   --   BILITOT 0.5  --   --   --   PROT 8.2*  --   --   --   ALBUMIN 3.4* 3.1* 3.1* 2.8*    CBG: Recent Labs  Lab 03/07/23 1032  GLUCAP 167*     Recent Results (from the past 240 hour(s))  Blood culture (routine x 2)     Status: None   Collection  Time: 03/01/23  8:00 PM   Specimen: BLOOD  Result Value Ref Range Status   Specimen Description BLOOD LEFT ANTECUBITAL  Final   Special Requests   Final    BOTTLES DRAWN AEROBIC AND ANAEROBIC Blood Culture adequate volume   Culture   Final    NO GROWTH 5 DAYS Performed at [facility], [address] [place], [place] [address]    Report Status 03/06/2023 FINAL  Final  Blood culture (routine x 2)     Status: None   Collection Time: 03/01/23  8:20 PM   Specimen: BLOOD RIGHT HAND  Result Value Ref Range Status   Specimen Description BLOOD RIGHT HAND  Final   Special Requests   Final    BOTTLES DRAWN AEROBIC AND ANAEROBIC Blood Culture adequate volume   Culture   Final    NO GROWTH 5 DAYS Performed at [facility], [address] [place], [place] [address]    Report Status 03/06/2023 FINAL  Final         Radiology Studies: IR Fluoro Guide CV Line Right  Result Date: 03/07/2023 INDICATION: Dialysis access EXAM: Placement of temporary hemodialysis catheter using ultrasound and fluoroscopic guidance MEDICATIONS: None ANESTHESIA/SEDATION: Local analgesia FLUOROSCOPY TIME:  Fluoroscopy Time: 1.4 minutes (6 mGy) COMPLICATIONS: None immediate. PROCEDURE:  Informed written consent was obtained from the patient after a thorough discussion of the procedural risks, benefits and alternatives. All questions were addressed. Maximal Sterile Barrier Technique was utilized including caps, mask, sterile gowns, sterile gloves, sterile drape, hand hygiene and skin antiseptic. A timeout was performed prior to the initiation of the procedure. The patient was placed supine on the exam table. The right neck and chest was prepped and draped in the standard sterile fashion. Ultrasound was used to evaluate the right internal jugular vein, which was found to be widely patent. A permanent image was stored in the electronic medical record. Using ultrasound guidance, the right internal jugular vein was directly punctured using a 21 gauge micropuncture set. Access site was serially dilated to accommodate an 035 wire, which was advanced into the IVC. Subsequently, serial tract dilation was performed and a 13 French temporary triple-lumen hemodialysis catheter was advanced into the central veins, such that the tip overlies the superior cavoatrial junction. The catheter was found to aspirate and flush appropriately. It was locked with the appropriate volume of heparinized saline. It was secured to the skin using silk suture and a sterile dressing. The patient tolerated the procedure well without immediate complication. IMPRESSION: Successful placement of a temporary triple-lumen hemodialysis catheter via the right internal jugular vein. The line is ready for immediate use. The line may be converted to a tunneled hemodialysis catheter at a later time when appropriate. Electronically Signed   By: Olive Bass M.D.   On: 03/07/2023 15:18   IR US Guide Vasc Access Right  Result Date: 03/07/2023 INDICATION: Dialysis access EXAM: Placement of temporary hemodialysis catheter using ultrasound and fluoroscopic guidance MEDICATIONS: None ANESTHESIA/SEDATION: Local analgesia FLUOROSCOPY TIME:   Fluoroscopy Time: 1.4 minutes (6 mGy) COMPLICATIONS: None immediate. PROCEDURE: Informed written consent was obtained from the patient after a thorough discussion of the procedural risks, benefits and alternatives. All questions were addressed. Maximal Sterile Barrier Technique was utilized including caps, mask, sterile gowns, sterile gloves, sterile drape, hand hygiene and skin antiseptic. A timeout was performed prior to the initiation of the procedure. The patient was placed supine on the exam table. The right neck and chest was prepped  and draped in the standard sterile fashion. Ultrasound was used to evaluate the right internal jugular vein, which was found to be widely patent. A permanent image was stored in the electronic medical record. Using ultrasound guidance, the right internal jugular vein was directly punctured using a 21 gauge micropuncture set. Access site was serially dilated to accommodate an 035 wire, which was advanced into the IVC. Subsequently, serial tract dilation was performed and a 13 French temporary triple-lumen hemodialysis catheter was advanced into the central veins, such that the tip overlies the superior cavoatrial junction. The catheter was found to aspirate and flush appropriately. It was locked with the appropriate volume of heparinized saline. It was secured to the skin using silk suture and a sterile dressing. The patient tolerated the procedure well without immediate complication. IMPRESSION: Successful placement of a temporary triple-lumen hemodialysis catheter via the right internal jugular vein. The line is ready for immediate use. The line may be converted to a tunneled hemodialysis catheter at a later time when appropriate. Electronically Signed   By: Olive Bass M.D.   On: 03/07/2023 15:18        Scheduled Meds:  amiodarone  200 mg Oral Daily   amitriptyline  10 mg Oral QHS   atorvastatin  80 mg Oral Daily   carvedilol  12.5 mg Oral BID WC   Chlorhexidine  Gluconate Cloth  6 each Topical Daily   clopidogrel  75 mg Oral Daily   dapsone  100 mg Oral Daily   dolutegravir-rilpivirine  1 tablet Oral Q lunch   heparin sodium (porcine)       isosorbide mononitrate  30 mg Oral Daily   LORazepam       midodrine  10 mg Oral TID WC   mirtazapine  7.5 mg Oral QHS   mupirocin ointment   Topical BID   sevelamer carbonate  2,400 mg Oral TID WC   sodium chloride flush  10-40 mL Intracatheter Q12H   vancomycin variable dose per unstable renal function (pharmacist dosing)   Does not apply See admin instructions   Continuous Infusions:  ceFEPime (MAXIPIME) IV Stopped (03/08/23 2205)   heparin 900 Units/hr (03/09/23 0042)     LOS: 7 days      Huey Bienenstock, MD [facility]   To contact the attending provider between 7A-7P or the covering provider during after hours 7P-7A, please log into the web site [email or web address] and access using universal [place] password for that web site. If you do not have the password, please call the hospital operator.  03/09/2023, 2:51 PM

## 2023-03-09 NOTE — Progress Notes (Signed)
Attempted to do wound care this shift and patient refused.  He also refused CHG bath.

## 2023-03-09 NOTE — Progress Notes (Signed)
   03/09/23 1238  Vitals  Temp 98.4 F (36.9 C)  Pulse Rate 76  Resp 14  BP (!) 109/50  SpO2 100 %  O2 Device Nasal Cannula  Oxygen Therapy  O2 Flow Rate (L/min) 4 L/min   Received patient in bed to unit.  Alert and oriented.  Informed consent signed and in chart.   TX duration:  Patient tolerated well.  Transported back to the room  Alert, without acute distress.  Hand-off given to patient's nurse.   Access used: Yes Access issues:Yes, pt was agitated and will not sit or lay still  Total UF removed: Medication(s) given: ativan IV. Vancomycin IV, midodrine Post HD VS: See Above Grid Post HD weight: 63.7 kg   [facility]

## 2023-03-09 NOTE — Progress Notes (Addendum)
Rounding Note    Patient Name: [name] Date of Encounter: 03/09/2023  [facility] Cardiologist: Chrystie Nose, MD   Subjective   Seen in HD, confused and agitated this morning. Keeps asking about which MD saw him this morning. Pain all over.   Inpatient Medications    Scheduled Meds:  amiodarone  200 mg Oral Daily   amitriptyline  10 mg Oral QHS   atorvastatin  80 mg Oral Daily   carvedilol  12.5 mg Oral BID WC   Chlorhexidine Gluconate Cloth  6 each Topical Daily   clopidogrel  75 mg Oral Daily   dapsone  100 mg Oral Daily   dolutegravir-rilpivirine  1 tablet Oral Q lunch   isosorbide mononitrate  30 mg Oral Daily   LORazepam       midodrine  10 mg Oral TID WC   mirtazapine  7.5 mg Oral QHS   mupirocin ointment   Topical BID   sevelamer carbonate  2,400 mg Oral TID WC   sodium chloride flush  10-40 mL Intracatheter Q12H   vancomycin variable dose per unstable renal function (pharmacist dosing)   Does not apply See admin instructions   Continuous Infusions:  ceFEPime (MAXIPIME) IV Stopped (03/08/23 2205)   heparin 900 Units/hr (03/09/23 0042)   vancomycin     PRN Meds: acetaminophen **OR** acetaminophen, albuterol, alteplase, heparin, HYDROmorphone (DILAUDID) injection, lidocaine (PF), lidocaine-prilocaine, LORazepam, LORazepam, nitroGLYCERIN, ondansetron **OR** ondansetron (ZOFRAN) IV, oxyCODONE, pentafluoroprop-tetrafluoroeth, senna-docusate, sevelamer carbonate, traMADol, traZODone   Vital Signs    Vitals:   03/08/23 2100 03/09/23 0041 03/09/23 0821 03/09/23 0858  BP:  (!) 90/44 (!) 150/65 (!) 183/110  Pulse: 79 76 98 92  Resp:  Temp: 97.9 F (36.6 C)     TempSrc: Oral  Oral   SpO2: 98% 99% 93% 100%  Weight:      Height:        Intake/Output Summary (Last 24 hours) at 03/09/2023 0939 Last data filed at 03/09/2023 0346 Gross per 24 hour  Intake 494.2 ml  Output --  Net 494.2 ml      03/07/2023    7:00 PM 03/07/2023     3:00 PM 03/03/2023    4:40 PM  Last 3 Weights  Weight (lbs) 140 lb 10.5 oz 145 lb 8.1 oz 138 lb 10.7 oz  Weight (kg) 63.8 kg 66 kg 62.9 kg      Telemetry    Sinus Rhythm (on HD monitor) - Personally Reviewed  ECG    No new tracing this morning  Physical Exam   GEN: No acute distress.   Neck: No JVD Cardiac: RRR, no murmurs, rubs, or gallops.  Respiratory: Clear to auscultation bilaterally. GI: Soft, nontender, non-distended  MS: No edema; No deformity. Old LUE fistula, temp cath to right upper chest Neuro:  Nonfocal   Labs    High Sensitivity Troponin:   Recent Labs  Lab 03/04/23 0911 03/06/23 1138 03/06/23 1322 03/08/23 1244 03/08/23 1446  TROPONINIHS 89* 89* 108* 5,152* 4,462*     Chemistry Recent Labs  Lab 03/03/23 0409 03/04/23 0910 03/06/23 0750 03/06/23 2036 03/07/23 0552 03/08/23 0323 03/09/23 0734  NA 133*   < > 134*  --  132* 135 134*  K 5.0   < > 6.1*   < > 5.8* 4.6 5.2*  CL 93*   < > 92*  --  93* 93* 95*  CO2 28   < > 22  --  21* 26 23  GLUCOSE 111*   < > 90  --  106* 81 94  BUN 42*   < > 72*  --  91* 45* 70*  CREATININE 6.91*   < > 11.56*  --  13.89* 9.19* 11.39*  CALCIUM 8.6*   < > 8.7*  --  8.4* 8.7* 8.6*  PROT 8.2*  --   --   --   --   --   --   ALBUMIN 3.4*  --  3.1*  --  3.1* 2.8*  --   AST 30  --   --   --   --   --   --   ALT 24  --   --   --   --   --   --   ALKPHOS 76  --   --   --   --   --   --   BILITOT 0.5  --   --   --   --   --   --   GFRNONAA 8*   < > 5*  --  4* 6* 5*  ANIONGAP 12   < > 20*  --  18* 16* 16*   < > = values in this interval not displayed.    Lipids No results for input(s): "CHOL", "TRIG", "HDL", "LABVLDL", "LDLCALC", "CHOLHDL" in the last 168 hours.  Hematology Recent Labs  Lab 03/07/23 0552 03/08/23 0323 03/09/23 0734  WBC 6.4 6.2 7.4  RBC 3.44* 3.54* 3.78*  HGB 9.7* 9.8* 10.5*  HCT 30.5* 32.3* 33.8*  MCV 88.7 91.2 89.4  MCH 28.2 27.7 27.8  MCHC 31.8 30.3 31.1  RDW 17.6* 17.2* 17.1*  PLT 185 205  217   Thyroid No results for input(s): "TSH", "FREET4" in the last 168 hours.  BNPNo results for input(s): "BNP", "PROBNP" in the last 168 hours.  DDimer No results for input(s): "DDIMER" in the last 168 hours.   Radiology    IR Fluoro Guide CV Line Right  Result Date: 03/07/2023 INDICATION: Dialysis access EXAM: Placement of temporary hemodialysis catheter using ultrasound and fluoroscopic guidance MEDICATIONS: None ANESTHESIA/SEDATION: Local analgesia FLUOROSCOPY TIME:  Fluoroscopy Time: 1.4 minutes (6 mGy) COMPLICATIONS: None immediate. PROCEDURE: Informed written consent was obtained from the patient after a thorough discussion of the procedural risks, benefits and alternatives. All questions were addressed. Maximal Sterile Barrier Technique was utilized including caps, mask, sterile gowns, sterile gloves, sterile drape, hand hygiene and skin antiseptic. A timeout was performed prior to the initiation of the procedure. The patient was placed supine on the exam table. The right neck and chest was prepped and draped in the standard sterile fashion. Ultrasound was used to evaluate the right internal jugular vein, which was found to be widely patent. A permanent image was stored in the electronic medical record. Using ultrasound guidance, the right internal jugular vein was directly punctured using a 21 gauge micropuncture set. Access site was serially dilated to accommodate an 035 wire, which was advanced into the IVC. Subsequently, serial tract dilation was performed and a 13 French temporary triple-lumen hemodialysis catheter was advanced into the central veins, such that the tip overlies the superior cavoatrial junction. The catheter was found to aspirate and flush appropriately. It was locked with the appropriate volume of heparinized saline. It was secured to the skin using silk suture and a sterile dressing. The patient tolerated the procedure well without immediate complication. IMPRESSION:  Successful placement of a temporary triple-lumen hemodialysis catheter via the  right internal jugular vein. The line is ready for immediate use. The line may be converted to a tunneled hemodialysis catheter at a later time when appropriate. Electronically Signed   By: Olive Bass M.D.   On: 03/07/2023 15:18   IR US Guide Vasc Access Right  Result Date: 03/07/2023 INDICATION: Dialysis access EXAM: Placement of temporary hemodialysis catheter using ultrasound and fluoroscopic guidance MEDICATIONS: None ANESTHESIA/SEDATION: Local analgesia FLUOROSCOPY TIME:  Fluoroscopy Time: 1.4 minutes (6 mGy) COMPLICATIONS: None immediate. PROCEDURE: Informed written consent was obtained from the patient after a thorough discussion of the procedural risks, benefits and alternatives. All questions were addressed. Maximal Sterile Barrier Technique was utilized including caps, mask, sterile gowns, sterile gloves, sterile drape, hand hygiene and skin antiseptic. A timeout was performed prior to the initiation of the procedure. The patient was placed supine on the exam table. The right neck and chest was prepped and draped in the standard sterile fashion. Ultrasound was used to evaluate the right internal jugular vein, which was found to be widely patent. A permanent image was stored in the electronic medical record. Using ultrasound guidance, the right internal jugular vein was directly punctured using a 21 gauge micropuncture set. Access site was serially dilated to accommodate an 035 wire, which was advanced into the IVC. Subsequently, serial tract dilation was performed and a 13 French temporary triple-lumen hemodialysis catheter was advanced into the central veins, such that the tip overlies the superior cavoatrial junction. The catheter was found to aspirate and flush appropriately. It was locked with the appropriate volume of heparinized saline. It was secured to the skin using silk suture and a sterile dressing. The  patient tolerated the procedure well without immediate complication. IMPRESSION: Successful placement of a temporary triple-lumen hemodialysis catheter via the right internal jugular vein. The line is ready for immediate use. The line may be converted to a tunneled hemodialysis catheter at a later time when appropriate. Electronically Signed   By: Olive Bass M.D.   On: 03/07/2023 15:18    Cardiac Studies   Echo 02/2023 ([facility])   MILD LV DYSFUNCTION (See above) WITH MILD LVH    MILD RV SYSTOLIC DYSFUNCTION (See above)    VALVULAR REGURGITATION: SEVERE AR, MILD MR, TRIVIAL PR, MILD TR    VALVULAR STENOSIS: MODERATE AS    --SEVERE DIFFUSE THICKENING OF AORTIC VALVE WITH SMALL, MOBILE TARGET SEEN    ON RIGHT CORONARY CUSP; ECHODENSE TISSUE AROUND AORTIC VALVE IS    CONCERNING FOR AORTIC ABSCESS.    --ASSOCIATED SEVERE AR    --SMALL OSCILATING MASS ON MV, DEGENERATIVE CHANGES VS. VEGETATION   Patient Profile     62 y.o. male with a hx of HIV, hypertension, ESRD on HD, paroxysmal atrial fibrillation, CAD s/p 4v CABG with recent s/p stent SVG-rPDA 12/2022 and aortic valve endocarditis who is being seen for the evaluation of chest pain/abnormal EKG at the request of Dr. Randol Kern.   Assessment & Plan    Chest pain CAD s/p 4v CABG 2022 (LIMA-LAD, RIMA T graft-RI/OM1, SVG-PDA) Recent PCI/DES to SVG-rPDA 12/2022  NSTEMI Patient has had basically constant chest pain since his admission.  His EKG showed ST depressions laterally which was unchanged.  Seen by Dr. Lynnette Caffey last week and noted threshold to cath would be positive troponins.  --  EKG is grossly unchanged, but hsTn 5152>>4462. Known coronary artery disease as above, but atypical presentation with constant chest pain.  - Eliquis has been held, remains on IV heparin -- given his agitation  this morning, no plans for cardiac cath today. Unable to even discuss cardiac cath procedure with patient. Receiving IV ativan presently. RN having trouble  getting HD started this morning. -- Continue to treat medically for now  Aortic Valve Endocarditis HD catheter infection Per ID note, no aortic root abscess based on [facility] TEE and a recent CT scan.  -- temp HD cath placed on 4/20 -- per ID  ESRD on HD Hyperkalemia -- K+ 5.2 today -- seen in HD this morning -- nephrology following   HTN -- Continue Coreg 25 mg twice daily, hydralazine 100 mg 3 times daily, Imdur 60 mg daily, lisinopril 10 mg daily   Paroxysmal Afib -- In sinus rhythm -- Continue amiodarone 200 mg daily, Eliquis held and on IV heparin.   -- Would continue to hold Eliquis until we decide about coronary angiography   HIV -Treatment per ID, see recent note.  Depression    For questions or updates, please contact [facility] Please consult [email or web address] for contact info under        Signed, Laverda Page, NP  03/09/2023, 9:39 AM    I have personally seen and examined this patient. I agree with the assessment and plan as outlined above.  He is agitated today. He will ultimately need a repeat cardiac cath given his troponin elevation. He has had constant chest pressure for days without EKG changes from his baseline.  No plans for cardiac cath today given his agitation. He is currently in HD.  Continue IV heparin for now.  We will follow up tomorrow.   Verne Carrow, MD, [facility] 03/09/2023 10:48 AM

## 2023-03-09 NOTE — Procedures (Signed)
Patient was seen on dialysis and the procedure was supervised.  BFR 400  Via temp cath BP is  110/86.   Patient appears to be tolerating treatment well-  did require ativan for agitation however   Cecille Aver 03/09/2023

## 2023-03-09 NOTE — Progress Notes (Signed)
Patient arrived to the [facility] unit severely agitated. Patient yelling, unable to stay still to get a blood pressure, setting machine off unable to let dialysis machine run due to him moving around. Provider Zeyfang called to the bedside. Advised to administer  of Ativan. Ativan administered.

## 2023-03-10 DIAGNOSIS — I358 Other nonrheumatic aortic valve disorders: Secondary | ICD-10-CM | POA: Diagnosis not present

## 2023-03-10 DIAGNOSIS — I214 Non-ST elevation (NSTEMI) myocardial infarction: Secondary | ICD-10-CM | POA: Diagnosis not present

## 2023-03-10 LAB — RENAL FUNCTION PANEL
Albumin: 2.9 g/dL — ABNORMAL LOW (ref 3.5–5.0)
Anion gap: 15 (ref 5–15)
BUN: 62 mg/dL — ABNORMAL HIGH (ref 8–23)
CO2: 25 mmol/L (ref 22–32)
Calcium: 8.8 mg/dL — ABNORMAL LOW (ref 8.9–10.3)
Chloride: 98 mmol/L (ref 98–111)
Creatinine, Ser: 9.45 mg/dL — ABNORMAL HIGH (ref 0.61–1.24)
GFR, Estimated: 6 mL/min — ABNORMAL LOW (ref >60)
Glucose, Bld: 143 mg/dL — ABNORMAL HIGH (ref 70–99)
Phosphorus: 5.6 mg/dL — ABNORMAL HIGH (ref 2.5–4.6)
Potassium: 4.9 mmol/L (ref 3.5–5.1)
Sodium: 138 mmol/L (ref 135–145)

## 2023-03-10 LAB — CBC
HCT: 33.3 % — ABNORMAL LOW (ref 39.0–52.0)
Hemoglobin: 10.2 g/dL — ABNORMAL LOW (ref 13.0–17.0)
MCH: 27.5 pg (ref 26.0–34.0)
MCHC: 30.6 g/dL (ref 30.0–36.0)
MCV: 89.8 fL (ref 80.0–100.0)
Platelets: 222 10*3/uL (ref 150–400)
RBC: 3.71 MIL/uL — ABNORMAL LOW (ref 4.22–5.81)
RDW: 16.9 % — ABNORMAL HIGH (ref 11.5–15.5)
WBC: 6.5 10*3/uL (ref 4.0–10.5)
nRBC: 0 % (ref 0.0–0.2)

## 2023-03-10 MED ORDER — VANCOMYCIN HCL 750 MG/150ML IV SOLN
750.0000 mg | INTRAVENOUS | Status: DC
  Administered 2023-03-10: 750 mg via INTRAVENOUS
  Filled 2023-03-10: qty 150

## 2023-03-10 MED ORDER — APIXABAN 5 MG PO TABS
5.0000 mg | ORAL_TABLET | Freq: Two times a day (BID) | ORAL | Status: DC
  Administered 2023-03-10 – 2023-03-12 (×5): 5 mg via ORAL
  Filled 2023-03-10 (×5): qty 1

## 2023-03-10 MED ORDER — HEPARIN SODIUM (PORCINE) 1000 UNIT/ML IJ SOLN
2600.0000 [IU] | Freq: Once | INTRAMUSCULAR | Status: AC
  Administered 2023-03-12: 2600 [IU]
  Filled 2023-03-10: qty 3

## 2023-03-10 NOTE — Progress Notes (Signed)
PROGRESS NOTE    [name]  ZOX:[id number] DOB: 03-02-1961 DOA: 03/02/2023 PCP: [name] Smart, MD    Chief Complaint  Patient presents with   Chest Pain    Brief Narrative:   [name] was admitted to the hospital with the working diagnosis of aortic valve endocarditis.    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 CT surgery determined patient was not a surgical candidate.  Patient left the hospital AMA, 04/11, then was re-hospitalized 04/13 but left again AMA on the 04/14.  Admitted to [facility] on 04/15 and left AMA later the same day.  Patient was readmitted again 4/15, renal consulted where he resumed his dialysis, ID has been consulted as well, TDC was discontinued for line holiday, it was [facility] 4/17, patient with significant agitation, noncompliance during hospital stay, given patient agitation, combativeness and noncompliance, did DC reinsertion has been aborted couple times, eventually he had temporarily HD catheter inserted 4/20 where he was dialyzed, hospital stay was significant for multiple complaints of chest pain, which appears to be noncardiac, full EKGs were obtained, nonacute, troponins were at baseline.  4/21 patient had complaints of chest pain where his troponin was significantly elevated at 5124.,  Which is diagnostic of non-STEMI, he was on heparin drip, seen by cardiology, he declined cardiac cath. Assessment & Plan:   Principal Problem:   Aortic valve endocarditis Active Problems:   ESRD on hemodialysis   CHF (congestive heart failure)   Hypertension   Human immunodeficiency virus (HIV) disease (HCC)   Coronary artery disease   Paroxysmal atrial fibrillation   Depression   Aortic valve endocarditis Vegetation  on tip of TDC  - ID input greatly appreciated, continue antibiotic therapy with IV vancomycin and cefepime. - TDC continued today, plan for line holiday. -No aortic root abscess.  EEG done at [facility] once reviewed by her ID -Blood Cx negative from early April at [facility]. TDC removed 4/17. On Vancomycin + Cefepime with HD x 6 week course  -Line holiday, TDC discontinued 4/17, couple attempts were aborted to reinsert [facility] due to noncompliance, eventually was reinserted 4/20, where had temp HD catheter placed  -Plan for [facility] likely tomorrow  ESRD on hemodialysis Hyperkalemia. Hyponatremia -renal input  greatly appreciated, continue with HD, patient on line holiday, TDC was discontinued on 4/17, temp catheter reinserted 4/20, after couple abandoned attempts due to agitation and noncompliance  -HD per renal, for HD today   Coronary artery disease Chest pain  NSTEMI -Status post 4 vessel CABG in 2022, and recent PCI /DES and SVG in February 2024 . -Continue with Plavix . -Aspirin has been discontinued given patient is on Eliquis and trying to avoid triple therapy . -Eliquis held 4/20 due to oozing from [facility] site -Troponin significantly elevated bated at 5124, concerning for non-STEMI on 4/21, cardiology input greatly appreciated, and has been medically treated for NSTEMI, he declined cardiac cath, so he can be transitioned back from IV heparin to Eliquis today.      CHF (congestive heart failure) No clinical signs of decompensation. Volume management with HD   Hypertension -Blood pressure soft this morning, started on midodrine, will maximize dose, minoxidil, lisinopril, and hydralazine has been discontinued,did decrease carvedilol and isosorbide dosing   Human immunodeficiency virus (  HIV) disease (HCC) Continue antiretroviral therapy. Management per ID   Paroxysmal atrial fibrillation Icthaband for anticoagulation, currently on hold due to recurrent oozing from [facility] removal site Rate control  with carvedilol and amiodarone.    Depression Continue with amitriptyline and mirtazapine.   oozing from [facility] removal site -Globin has been remained stable so far, required multiple dressing changes over last 24 hours due to significant oozing, continue to monitor CBC closely, this has been held on 4/20   I have discussed with patient multiple times during this hospitalization that his history of noncompliance, refusal of care, and signing AMA multiple times from multiple facilities, puts his life at risk.   DVT prophylaxis: heparin GTT Code Status: (Full) Family Communication: None at bedside, D/W [name] 4/22 Disposition:   Status is: Inpatient    Consultants:  Renal ID cardiology   Subjective:  No significant events overnight, he denies any complaints today Objective: Vitals:   03/10/23 1200 03/10/23 1242 03/10/23 1248 03/10/23 1259  BP: (!) 116/49  (!) 118/44 (!) 113/42  Pulse: 79  88 81  Resp: 14  17 18   Temp: 97.6 F (36.4 C)  (!) 97.5 F (36.4 C)   TempSrc: Oral  Oral   SpO2: 95%  96% 97%  Weight:  65.3 kg    Height:       No intake or output data in the 24 hours ending 03/10/23 1322    Filed Weights   03/07/23 1900 03/09/23 1236 03/10/23 1242  Weight: 63.8 kg 63.7 kg 65.3 kg    Examination:   Awake Alert, no apparent distress, right IJ trialysis catheter present Symmetrical Chest wall movement, Good air movement bilaterally, CTAB RRR,No Gallops,Rubs or new Murmurs, No Parasternal Heave +ve B.Sounds, Abd Soft, No tenderness, No rebound - guarding or rigidity. No Cyanosis, Clubbing or edema, No new Rash or bruise      Data Reviewed: I have personally reviewed following labs and imaging studies  CBC: Recent Labs  Lab 03/06/23 0750 03/07/23 0552 03/08/23 0323 03/09/23 0734 03/10/23 1147  WBC 7.3 6.4 6.2 7.4 6.5  NEUTROABS  --  3.2  --  3.6  --   HGB 11.4* 9.7* 9.8* 10.5* 10.2*  HCT 37.5* 30.5* 32.3* 33.8* 33.3*  MCV 90.1 88.7 91.2 89.4  89.8  PLT 233 185 205 217 222    Basic Metabolic Panel: Recent Labs  Lab 03/06/23 0750 03/06/23 2036 03/07/23 0552 03/08/23 0323 03/09/23 0734 03/10/23 1147  NA 134*  --  132* 135 134* 138  K 6.1* 6.2* 5.8* 4.6 5.2* 4.9  CL 92*  --  93* 93* 95* 98  CO2 22  --  21* 26 23 25   GLUCOSE 90  --  106* 81 94 143*  BUN 72*  --  91* 45* 70* 62*  CREATININE 11.56*  --  13.89* 9.19* 11.39* 9.45*  CALCIUM 8.7*  --  8.4* 8.7* 8.6* 8.8*  PHOS 7.5*  --  8.2* 5.8*  --  5.6*    GFR: Estimated Creatinine Clearance: 7.1 mL/min (A) (by C-G formula based on SCr of 9.45 mg/dL (H)).  Liver Function Tests: Recent Labs  Lab 03/06/23 0750 03/07/23 0552 03/08/23 0323 03/10/23 1147  ALBUMIN 3.1* 3.1* 2.8* 2.9*    CBG: Recent Labs  Lab 03/07/23 1032  GLUCAP 167*     Recent Results (from the past 240 hour(s))  Blood culture (routine x 2)     Status: None   Collection Time: 03/01/23  8:00 PM  Specimen: BLOOD  Result Value Ref Range Status   Specimen Description BLOOD LEFT ANTECUBITAL  Final   Special Requests   Final    BOTTLES DRAWN AEROBIC AND ANAEROBIC Blood Culture adequate volume   Culture   Final    NO GROWTH 5 DAYS Performed at [facility], [address] [place], [place] [address]    Report Status 03/06/2023 FINAL  Final  Blood culture (routine x 2)     Status: None   Collection Time: 03/01/23  8:20 PM   Specimen: BLOOD RIGHT HAND  Result Value Ref Range Status   Specimen Description BLOOD RIGHT HAND  Final   Special Requests   Final    BOTTLES DRAWN AEROBIC AND ANAEROBIC Blood Culture adequate volume   Culture   Final    NO GROWTH 5 DAYS Performed at [facility], [address], [place], [place] [address]    Report Status 03/06/2023 FINAL  Final         Radiology Studies: No results found.      Scheduled Meds:  amiodarone  200 mg Oral Daily   amitriptyline  10 mg Oral QHS   apixaban  5 mg Oral BID   atorvastatin  80 mg Oral Daily    carvedilol  12.5 mg Oral BID WC   Chlorhexidine Gluconate Cloth  6 each Topical Daily   clopidogrel  75 mg Oral Daily   dapsone  100 mg Oral Daily   dolutegravir-rilpivirine  1 tablet Oral Q lunch   isosorbide mononitrate  30 mg Oral Daily   midodrine  10 mg Oral TID WC   mirtazapine  7.5 mg Oral QHS   mupirocin ointment   Topical BID   sevelamer carbonate  2,400 mg Oral TID WC   sodium chloride flush  10-40 mL Intracatheter Q12H   Continuous Infusions:  ceFEPime (MAXIPIME) IV 1 g (03/09/23 2200)   vancomycin       LOS: 8 days      Huey Bienenstock, MD [facility]   To contact the attending provider between 7A-7P or the covering provider during after hours 7P-7A, please log into the web site [email or web address] and access using universal [place] password for that web site. If you do not have the password, please call the hospital operator.  03/10/2023, 1:22 PM

## 2023-03-10 NOTE — Progress Notes (Signed)
Advised HD Candance, RN to give Vancomycin during HD session.

## 2023-03-10 NOTE — Procedures (Signed)
Patient was seen on dialysis and the procedure was supervised.  BFR 400  Via temp HD cath BP is  113/42.   Patient appears to be tolerating treatment well  Cecille Aver 03/10/2023

## 2023-03-10 NOTE — Progress Notes (Signed)
Rounding Note    Patient Name: [name] Date of Encounter: 03/10/2023  [facility] Cardiologist: Chrystie Nose, MD   Subjective   Minimal chest pain. Improved from yesterday.   Inpatient Medications    Scheduled Meds:  amiodarone  200 mg Oral Daily   amitriptyline  10 mg Oral QHS   atorvastatin  80 mg Oral Daily   carvedilol  12.5 mg Oral BID WC   Chlorhexidine Gluconate Cloth  6 each Topical Daily   clopidogrel  75 mg Oral Daily   dapsone  100 mg Oral Daily   dolutegravir-rilpivirine  1 tablet Oral Q lunch   isosorbide mononitrate  30 mg Oral Daily   midodrine  10 mg Oral TID WC   mirtazapine  7.5 mg Oral QHS   mupirocin ointment   Topical BID   sevelamer carbonate  2,400 mg Oral TID WC   sodium chloride flush  10-40 mL Intracatheter Q12H   vancomycin variable dose per unstable renal function (pharmacist dosing)   Does not apply See admin instructions   Continuous Infusions:  ceFEPime (MAXIPIME) IV 1 g (03/09/23 2200)   heparin 900 Units/hr (03/09/23 1928)   PRN Meds: acetaminophen **OR** acetaminophen, albuterol, HYDROmorphone (DILAUDID) injection, LORazepam, nitroGLYCERIN, ondansetron **OR** ondansetron (ZOFRAN) IV, oxyCODONE, senna-docusate, sevelamer carbonate, traMADol, traZODone   Vital Signs    Vitals:   03/10/23 0350 03/10/23 0355 03/10/23 0400 03/10/23 0405  BP:   (!) 101/52 (!) 117/55  Pulse:      Resp: (!) (!) 21  Temp:    98.1 F (36.7 C)  TempSrc:    Oral  SpO2:    96%  Weight:      Height:        Intake/Output Summary (Last 24 hours) at 03/10/2023 1125 Last data filed at 03/09/2023 1238 Gross per 24 hour  Intake --  Output 1000 ml  Net -1000 ml      03/09/2023   12:36 PM 03/07/2023    7:00 PM 03/07/2023    3:00 PM  Last 3 Weights  Weight (lbs) 140 lb 6.9 oz 140 lb 10.5 oz 145 lb 8.1 oz  Weight (kg) 63.7 kg 63.8 kg 66 kg      Telemetry    Sinus - Personally Reviewed  ECG    No new tracing this  morning  Physical Exam   GEN: NAD Neck: No JVD Cardiac: RRR, no murmurs   Respiratory: Clear bilaterally GI: Soft, NT, ND MS: No LE edema; Old LUE fistula, temp cath right upper chest Neuro:  Nonfocal  Labs    High Sensitivity Troponin:   Recent Labs  Lab 03/04/23 0911 03/06/23 1138 03/06/23 1322 03/08/23 1244 03/08/23 1446  TROPONINIHS 89* 89* 108* 5,152* 4,462*     Chemistry Recent Labs  Lab 03/06/23 0750 03/06/23 2036 03/07/23 0552 03/08/23 0323 03/09/23 0734  NA 134*  --  132* 135 134*  K 6.1*   < > 5.8* 4.6 5.2*  CL 92*  --  93* 93* 95*  CO2 22  --  21* 26 23  GLUCOSE 90  --  106* 81 94  BUN 72*  --  91* 45* 70*  CREATININE 11.56*  --  13.89* 9.19* 11.39*  CALCIUM 8.7*  --  8.4* 8.7* 8.6*  ALBUMIN 3.1*  --  3.1* 2.8*  --   GFRNONAA 5*  --  4* 6* 5*  ANIONGAP 20*  --  18* 16* 16*   < > =  values in this interval not displayed.    Lipids No results for input(s): "CHOL", "TRIG", "HDL", "LABVLDL", "LDLCALC", "CHOLHDL" in the last 168 hours.  Hematology Recent Labs  Lab 03/07/23 0552 03/08/23 0323 03/09/23 0734  WBC 6.4 6.2 7.4  RBC 3.44* 3.54* 3.78*  HGB 9.7* 9.8* 10.5*  HCT 30.5* 32.3* 33.8*  MCV 88.7 91.2 89.4  MCH 28.2 27.7 27.8  MCHC 31.8 30.3 31.1  RDW 17.6* 17.2* 17.1*  PLT 185 205 217   Thyroid No results for input(s): "TSH", "FREET4" in the last 168 hours.  BNPNo results for input(s): "BNP", "PROBNP" in the last 168 hours.  DDimer No results for input(s): "DDIMER" in the last 168 hours.   Radiology    No results found.  Cardiac Studies   Echo 02/2023 ([facility])   MILD LV DYSFUNCTION (See above) WITH MILD LVH    MILD RV SYSTOLIC DYSFUNCTION (See above)    VALVULAR REGURGITATION: SEVERE AR, MILD MR, TRIVIAL PR, MILD TR    VALVULAR STENOSIS: MODERATE AS    --SEVERE DIFFUSE THICKENING OF AORTIC VALVE WITH SMALL, MOBILE TARGET SEEN    ON RIGHT CORONARY CUSP; ECHODENSE TISSUE AROUND AORTIC VALVE IS    CONCERNING FOR AORTIC ABSCESS.     --ASSOCIATED SEVERE AR    --SMALL OSCILATING MASS ON MV, DEGENERATIVE CHANGES VS. VEGETATION   Patient Profile     62 y.o. male with a hx of HIV, hypertension, ESRD on HD, paroxysmal atrial fibrillation, CAD s/p 4v CABG with recent s/p stent SVG-rPDA 12/2022 and aortic valve endocarditis who is being seen for the evaluation of chest pain/abnormal EKG at the request of Dr. Randol Kern.   Assessment & Plan    CAD/NSTEMI: Pt admitted with chest pain. He is known to have CAD s/p 4v CABG 2022 (LIMA-LAD, RIMA T graft-RI/OM1, SVG-PDA). Recent PCI/DES to SVG-rPDA 12/2022. Troponin up over 5,000 and trending down. Eliquis has been held. He remains on IV heparin. We discussed arranging a cardiac cath this week but he does not wish to do this. We will not plan a cath. Pursue medical management of CAD.  OK to d/c IV heparin and resume Eliquis per home dosing.   Aortic Valve Endocarditis/HD catheter infection: Per ID note, no aortic root abscess based on [facility] TEE and a recent CT scan. Temp HD cath placed on 4/20.   ESRD on HD: He had dialysis on Monday 03/09/23.    HTN: BP is stable. Continue Coreg 25 mg twice daily, hydralazine 100 mg 3 times daily, Imdur 60 mg daily, lisinopril 10 mg daily   Paroxysmal Afib: Sinus today. Continue amiodarone 200 mg daily, Eliquis held and on IV heparin. See above. OK to stop heparin today and resume Eliquis.    HIV: Treatment per ID, see recent note.  Verne Carrow, MD, [facility] 03/10/2023 11:25 AM

## 2023-03-10 NOTE — Progress Notes (Addendum)
Subjective: Sitting up on side of bed, states feels better than yesterday, HD yesterday with temporary IJ hd Cath required Ativan secondary agitation.  No agitation today.  Alert /calm appears normal MS  Objective Vital signs in last 24 hours: Vitals:   03/10/23 0350 03/10/23 0355 03/10/23 0400 03/10/23 0405  BP:   (!) 101/52 (!) 117/55  Pulse:      Resp: (!) (!) 21  Temp:    98.1 F (36.7 C)  TempSrc:    Oral  SpO2:    96%  Weight:      Height:       Weight change:   Physical Exam: General: Chronically ill adult male, NAD Heart: RRR, no MRG Lungs: CTA bilaterally nonlabored breathing Abdomen: NABS, soft NTND Extremities: No pedal edema  Dialysis Access: Right IJ temporary HD catheter (IR placed 4/20)   OP Dialysis Orders: TTS [place]/ [place] [facility] 4h  420/ 800   68kg   2/2/ bath  Heparin ?  RIJ TDC   Problem/Plan: AoV endocarditis - vegetation on tip of TDC: Blood Cx negative from early April at [facility]. TDC removed 4/17. On Vancomycin + Cefepime with HD x 6 week course. ESRD: Usual TTS schedule -off schedule secondary to the HD cath removed ( b4 yest last HD 03/03/23).// HD yesterday 4/22 / short hd today to keep on schedule   Will have IR change temp cath  to [facility] sometime this week . HTN/volume: BP variable a lot of intermittent agitation, BP this a.m. 117/55.  Calm not agitated / Noted  on midodrine now in hospital, appears close to euvolemic, well below prior EDW. Will lower EDW at discharge.  Chest x-ray 4/14 no active lung disease Anemia of ESRD: . Hgb 10.5 <9.8 was .(However because of infection no IV iron yet) monitor Hgb trend to resume ESA as needed none currently Secondary HPTH: Corr Ca ok and phos 5.8  - continue sevelamer as binder. HIV: Continue home meds   Lenny Pastel, PA-C [facility] Beeper [phone] 03/10/2023,11:11 AM  LOS: 8 days   Patient seen and examined, agree with above note with above modifications. Seen in HD-   not feeling well-  temp cath hurts-  said he had heartburn yesterday and they would not give him anything.  Running today to get on schedule-  next HD will be Thursday.  Not sure what the end point is  Annie Sable, MD 03/10/2023    Labs: Basic Metabolic Panel: Recent Labs  Lab 03/06/23 0750 03/06/23 2036 03/07/23 0552 03/08/23 0323 03/09/23 0734  NA 134*  --  132* 135 134*  K 6.1*   < > 5.8* 4.6 5.2*  CL 92*  --  93* 93* 95*  CO2 22  --  21* 26 23  GLUCOSE 90  --  106* 81 94  BUN 72*  --  91* 45* 70*  CREATININE 11.56*  --  13.89* 9.19* 11.39*  CALCIUM 8.7*  --  8.4* 8.7* 8.6*  PHOS 7.5*  --  8.2* 5.8*  --    < > = values in this interval not displayed.   Liver Function Tests: Recent Labs  Lab 03/06/23 0750 03/07/23 0552 03/08/23 0323  ALBUMIN 3.1* 3.1* 2.8*   No results for input(s): "LIPASE", "AMYLASE" in the last 168 hours. No results for input(s): "AMMONIA" in the last 168 hours. CBC: Recent Labs  Lab 03/04/23 0910 03/06/23 0750 03/07/23 0552 03/08/23 0323 03/09/23 0734  WBC 6.9 7.3 6.4  6.2 7.4  NEUTROABS  --   --  3.2  --  3.6  HGB 12.3* 11.4* 9.7* 9.8* 10.5*  HCT 39.6 37.5* 30.5* 32.3* 33.8*  MCV 89.0 90.1 88.7 91.2 89.4  PLT 239 233 185 205 217   Cardiac Enzymes: No results for input(s): "CKTOTAL", "CKMB", "CKMBINDEX", "TROPONINI" in the last 168 hours. CBG: Recent Labs  Lab 03/07/23 1032  GLUCAP 167*    Studies/Results: No results found. Medications:  ceFEPime (MAXIPIME) IV 1 g (03/09/23 2200)   heparin 900 Units/hr (03/09/23 1928)    amiodarone  200 mg Oral Daily   amitriptyline  10 mg Oral QHS   atorvastatin  80 mg Oral Daily   carvedilol  12.5 mg Oral BID WC   Chlorhexidine Gluconate Cloth  6 each Topical Daily   clopidogrel  75 mg Oral Daily   dapsone  100 mg Oral Daily   dolutegravir-rilpivirine  1 tablet Oral Q lunch   isosorbide mononitrate  30 mg Oral Daily   midodrine  10 mg Oral TID WC   mirtazapine  7.5 mg Oral QHS    mupirocin ointment   Topical BID   sevelamer carbonate  2,400 mg Oral TID WC   sodium chloride flush  10-40 mL Intracatheter Q12H   vancomycin variable dose per unstable renal function (pharmacist dosing)   Does not apply See admin instructions

## 2023-03-10 NOTE — Progress Notes (Signed)
Pharmacy Antibiotic Note  [name] is a 62 y.o. male  with culture negative endocarditis.  Pharmacy has been consulted for cefepime + vancomycin dosing. Pt has ESRD on HD usually TTS, currently off-schedule.  Pt to undergo iHD again today in attempt to get on schedule, will schedule out future vancomycin doses. Will likely need to check VR prior to next HD as last couple sessions have been slightly shorter.  Plan: -Vancomycin  IV qHD TTS -Continue Cefepime 1g IV Q24H  -Will need to check VR prior to next HD given shorter sessions -Will continue to follow HD schedule/duration, culture results, LOT, and antibiotic de-escalation plans   Height:  (165.1 cm) Weight: 63.7 kg (140 lb 6.9 oz) IBW/kg (Calculated) : 61.5  Temp (24hrs), Avg:98.1 F (36.7 C), Min:97.6 F (36.4 C), Max:98.4 F (36.9 C)  Recent Labs  Lab 03/04/23 0910 03/06/23 0750 03/06/23 1138 03/07/23 0552 03/08/23 0323 03/09/23 0734  WBC 6.9 7.3  --  6.4 6.2 7.4  CREATININE 7.60* 11.56*  --  13.89* 9.19* 11.39*  VANCORANDOM  --   --  28  --  16  --      Estimated Creatinine Clearance: 5.9 mL/min (A) (by C-G formula based on SCr of 11.39 mg/dL (H)).    Allergies  Allergen Reactions   Penicillins Anaphylaxis    Has patient had a PCN reaction causing immediate rash, facial/tongue/throat swelling, SOB or lightheadedness with hypotension: Unknown Has patient had a PCN reaction causing severe rash involving mucus membranes or skin necrosis: Unknown Has patient had a PCN reaction that required hospitalization: Unknown Has patient had a PCN reaction occurring within the last 10 years: No If all of the above answers are "NO", then may proceed with Cephalosporin use.   Pepcid [Famotidine] Other (See Comments)   Prilosec [Omeprazole] Other (See Comments)    Antimicrobials this admission: Vanc + CTX PTA at [facility] Rocephin 4/16 >> 4/17 Cefepime 4/17 >> Vanc 4/16 >>  Dose adjustments this admission: 4/14  VR on admit after being at [facility] = 16 mcg/ml 4/17 VR 39 mcg/ml (from infiltrated arm so falsely high but still likely >15 mcg/ml) 4/21 VR 16 mcg/ml  Microbiology results: 4/14 BCx: negative   [name], PharmD, BCPS, [facility] Clinical Pharmacist [phone] Please check AMION for all [facility] Pharmacy numbers 03/10/2023

## 2023-03-10 NOTE — Progress Notes (Signed)
Chief Complaint: Patient was seen in consultation today for durable HD access   Supervising Physician: Gilmer Mor  Patient Status: [facility] - In-pt  History of Present Illness: [name] is a 62 y.o. male with ESRD on HD. Has had removal of tunneled HD cath with line holiday for bacteremia and placement of temp cath this admission. Has been on IV abx and followed by ID. He is stable and ready for conversion back to tunneled HD cath.  Pt seen while on HD. No c/o.  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   Candidiasis of mouth    Cardiomyopathy    Chest pain, unspecified    Diseases of lips    Angular cheilitis   HIV infection    Hypertension    Lymphoma    Personal history of unspecified disease of respiratory system    Pneumocystosis    Shingles    Tobacco use     Past Surgical History:  Procedure Laterality Date   BASCILIC VEIN TRANSPOSITION Left 05/24/2018   Procedure: FIRST STAGE BASCILIC VEIN TRANSPOSITION;  Surgeon: Larina Earthly, MD;  Location: [facility] OR;  Service: Vascular;  Laterality: Left;   IR FLUORO GUIDE CV LINE RIGHT  05/17/2018   IR FLUORO GUIDE CV LINE RIGHT  03/07/2023   IR REMOVAL TUN CV CATH W/O FL  03/04/2023   IR US GUIDE VASC ACCESS RIGHT  05/17/2018   IR US GUIDE VASC ACCESS RIGHT  03/07/2023    Allergies: Penicillins, Pepcid [famotidine], and Prilosec [omeprazole]  Medications:  Current Facility-Administered Medications:    acetaminophen (TYLENOL) tablet 650 mg, 650 mg, Oral, Q6H PRN, 650 mg at 03/10/23 1454 **OR** acetaminophen (TYLENOL) suppository 650 mg, 650 mg, Rectal, Q6H PRN, Kirby Crigler, Mir M, MD   albuterol (PROVENTIL) (2.5 MG/3ML) 0.083% nebulizer solution 2.5 mg, 2.5 mg, Nebulization, Q2H PRN, Kirby Crigler, Mir M, MD   amiodarone (PACERONE) tablet 200 mg, 200 mg, Oral, Daily, Arrien, York Ram, MD, 200 mg at 03/10/23 4034   amitriptyline (ELAVIL) tablet 10 mg, 10 mg,  Oral, QHS, Kirby Crigler, Mir M, MD, 10 mg at 03/09/23 2202   apixaban (ELIQUIS) tablet 5 mg, 5 mg, Oral, BID, Verne Carrow D, MD, 5 mg at 03/10/23 1214   atorvastatin (LIPITOR) tablet 80 mg, 80 mg, Oral, Daily, Kirby Crigler, Mir M, MD, 80 mg at 03/10/23 0943   carvedilol (COREG) tablet 12.5 mg, 12.5 mg, Oral, BID WC, Elgergawy, Leana Roe, MD, 12.5 mg at 03/10/23 0943   ceFEPIme (MAXIPIME) 1 g in sodium chloride 0.9 % 100 mL IVPB, 1 g, Intravenous, Q24H, Silvana Newness, RPH, Last Rate: 200 mL/hr at 03/09/23 2200, 1 g at 03/09/23 2200   Chlorhexidine Gluconate Cloth 2 % PADS 6 each, 6 each, Topical, Daily, Kirby Crigler, Mir M, MD, 6 each at 03/10/23 1212   clopidogrel (PLAVIX) tablet 75 mg, 75 mg, Oral, Daily, Kirby Crigler, Mir M, MD, 75 mg at 03/10/23 7425   dapsone tablet 100 mg, 100 mg, Oral, Daily, Daiva Eves, Lisette Grinder, MD, 100 mg at 03/09/23 1750   dolutegravir-rilpivirine (JULUCA) 50-25 MG per tablet 1 tablet, 1 tablet, Oral, Q lunch, Daiva Eves, Lisette Grinder, MD, 1 tablet at 03/09/23 1746   HYDROmorphone (DILAUDID) injection 0.5 mg, 0.5 mg, Intravenous, Q3H PRN, Kirby Crigler, Mir M, MD, 0.5 mg at 03/10/23 0948   isosorbide mononitrate (IMDUR) 24 hr tablet 30 mg, 30 mg, Oral, Daily, Elgergawy, Leana Roe, MD, 30 mg  at 03/10/23 0943   LORazepam (ATIVAN) injection 0.5 mg, 0.5 mg, Intravenous, Q6H PRN, Elgergawy, Leana Roe, MD, 0.5 mg at 03/09/23 2217   midodrine (PROAMATINE) tablet 10 mg, 10 mg, Oral, TID WC, Elgergawy, Leana Roe, MD, 10 mg at 03/10/23 1150   mirtazapine (REMERON) tablet 7.5 mg, 7.5 mg, Oral, QHS, Kirby Crigler, Mir M, MD, 7.5 mg at 03/09/23 2210   mupirocin ointment (BACTROBAN) 2 %, , Topical, BID, Elgergawy, Leana Roe, MD, Given at 03/10/23 1051   nitroGLYCERIN (NITROSTAT) SL tablet 0.4 mg, 0.4 mg, Sublingual, Q5 min PRN, Elgergawy, Leana Roe, MD, 0.4 mg at 03/08/23 1348   ondansetron (ZOFRAN) tablet 4 mg, 4 mg, Oral, Q6H PRN, 4 mg at 03/02/23 2159 **OR** ondansetron (ZOFRAN) injection 4 mg, 4  mg, Intravenous, Q6H PRN, Kirby Crigler, Mir M, MD   oxyCODONE (Oxy IR/ROXICODONE) immediate release tablet 10 mg, 10 mg, Oral, Q8H PRN, Kirby Crigler, Mir M, MD, 10 mg at 03/10/23 1150   senna-docusate (Senokot-S) tablet 1 tablet, 1 tablet, Oral, QHS PRN, Kirby Crigler, Mir M, MD   sevelamer carbonate (RENVELA) tablet 1,600 mg, 1,600 mg, Oral, TID BM PRN, Francena Hanly, RPH   sevelamer carbonate (RENVELA) tablet 2,400 mg, 2,400 mg, Oral, TID WC, Francena Hanly, RPH, 2,400 mg at 03/10/23 1152   sodium chloride flush (NS) 0.9 % injection 10-40 mL, 10-40 mL, Intracatheter, Q12H, Elgergawy, Leana Roe, MD, 10 mL at 03/10/23 1051   traMADol (ULTRAM) tablet 50 mg, 50 mg, Oral, Q12H PRN, Kirby Crigler, Mir M, MD, 50 mg at 03/08/23 0931   traZODone (DESYREL) tablet 25 mg, 25 mg, Oral, QHS PRN, Kirby Crigler, Mir M, MD, 25 mg at 03/06/23 2015   vancomycin (VANCOREADY) IVPB 750 mg/150 mL, 750 mg, Intravenous, Q T,Th,Sa-HD, Mosetta Anis, RPH, Last Rate: 150 mL/hr at 03/10/23 1453, 750 mg at 03/10/23 1453    Family History  Problem Relation Age of Onset   Heart disease Mother    Diabetes Mother    Cancer Mother    Diabetes Sister    CAD Sister        ? stents   Diabetes Maternal Grandmother    Hypertension Brother     Social History   Socioeconomic History   Marital status: Single    Spouse name: Not on file   Number of children: Not on file   Years of education: Not on file   Highest education level: Not on file  Occupational History   Not on file  Tobacco Use   Smoking status: Former    Packs/day: 1.00    Years: 35.00    Additional pack years: 0.00    Total pack years: 35.00    Types: Cigarettes    Quit date: 11/23/2015    Years since quitting: 7.2   Smokeless tobacco: Never   Tobacco comments:    would like help quitting  Substance and Sexual Activity   Alcohol use: Yes    Alcohol/week: 0.0 standard drinks of alcohol    Comment: beer weekends   Drug use: No   Sexual activity: Yes     Partners: Female    Comment: pt. declined condoms  Other Topics Concern   Not on file  Social History Narrative   Single - unemployed   + EtOH weekend beer   Former smoker   + THC   Social Determinants of Health   Financial Resource Strain: Not on file  Food Insecurity: Not on file  Transportation Needs: Not on file  Physical Activity: Not  on file  Stress: Not on file  Social Connections: Not on file    Review of Systems: A 12 point ROS discussed and pertinent positives are indicated in the HPI above.  All other systems are negative.  Review of Systems  Vital Signs: BP (!) 106/54 (BP Location: Right Arm)   Pulse 77   Temp (!) 97.5 F (36.4 C) (Oral)   Resp 11   Ht  (1.651 m)   Wt 143 lb 15.4 oz (65.3 kg) Comment: STANDING  SpO2 100%   BMI 23.96 kg/m   Physical Exam Constitutional:      Appearance: He is not ill-appearing.  Neck:     Comments: (R)IJ temp HD cath intact, running well Cardiovascular:     Rate and Rhythm: Normal rate and regular rhythm.     Heart sounds: Normal heart sounds.  Pulmonary:     Effort: Pulmonary effort is normal. No respiratory distress.     Breath sounds: Normal breath sounds.  Skin:    General: Skin is warm and dry.  Neurological:     General: No focal deficit present.     Mental Status: He is alert and oriented to person, place, and time.     Imaging: IR Fluoro Guide CV Line Right  Result Date: 03/07/2023 INDICATION: Dialysis access EXAM: Placement of temporary hemodialysis catheter using ultrasound and fluoroscopic guidance MEDICATIONS: None ANESTHESIA/SEDATION: Local analgesia FLUOROSCOPY TIME:  Fluoroscopy Time: 1.4 minutes (6 mGy) COMPLICATIONS: None immediate. PROCEDURE: Informed written consent was obtained from the patient after a thorough discussion of the procedural risks, benefits and alternatives. All questions were addressed. Maximal Sterile Barrier Technique was utilized including caps, mask, sterile gowns,  sterile gloves, sterile drape, hand hygiene and skin antiseptic. A timeout was performed prior to the initiation of the procedure. The patient was placed supine on the exam table. The right neck and chest was prepped and draped in the standard sterile fashion. Ultrasound was used to evaluate the right internal jugular vein, which was found to be widely patent. A permanent image was stored in the electronic medical record. Using ultrasound guidance, the right internal jugular vein was directly punctured using a 21 gauge micropuncture set. Access site was serially dilated to accommodate an 035 wire, which was advanced into the IVC. Subsequently, serial tract dilation was performed and a 13 French temporary triple-lumen hemodialysis catheter was advanced into the central veins, such that the tip overlies the superior cavoatrial junction. The catheter was found to aspirate and flush appropriately. It was locked with the appropriate volume of heparinized saline. It was secured to the skin using silk suture and a sterile dressing. The patient tolerated the procedure well without immediate complication. IMPRESSION: Successful placement of a temporary triple-lumen hemodialysis catheter via the right internal jugular vein. The line is ready for immediate use. The line may be converted to a tunneled hemodialysis catheter at a later time when appropriate. Electronically Signed   By: Olive Bass M.D.   On: 03/07/2023 15:18   IR US Guide Vasc Access Right  Result Date: 03/07/2023 INDICATION: Dialysis access EXAM: Placement of temporary hemodialysis catheter using ultrasound and fluoroscopic guidance MEDICATIONS: None ANESTHESIA/SEDATION: Local analgesia FLUOROSCOPY TIME:  Fluoroscopy Time: 1.4 minutes (6 mGy) COMPLICATIONS: None immediate. PROCEDURE: Informed written consent was obtained from the patient after a thorough discussion of the procedural risks, benefits and alternatives. All questions were addressed. Maximal  Sterile Barrier Technique was utilized including caps, mask, sterile gowns, sterile gloves, sterile drape, hand hygiene  and skin antiseptic. A timeout was performed prior to the initiation of the procedure. The patient was placed supine on the exam table. The right neck and chest was prepped and draped in the standard sterile fashion. Ultrasound was used to evaluate the right internal jugular vein, which was found to be widely patent. A permanent image was stored in the electronic medical record. Using ultrasound guidance, the right internal jugular vein was directly punctured using a 21 gauge micropuncture set. Access site was serially dilated to accommodate an 035 wire, which was advanced into the IVC. Subsequently, serial tract dilation was performed and a 13 French temporary triple-lumen hemodialysis catheter was advanced into the central veins, such that the tip overlies the superior cavoatrial junction. The catheter was found to aspirate and flush appropriately. It was locked with the appropriate volume of heparinized saline. It was secured to the skin using silk suture and a sterile dressing. The patient tolerated the procedure well without immediate complication. IMPRESSION: Successful placement of a temporary triple-lumen hemodialysis catheter via the right internal jugular vein. The line is ready for immediate use. The line may be converted to a tunneled hemodialysis catheter at a later time when appropriate. Electronically Signed   By: Olive Bass M.D.   On: 03/07/2023 15:18   IR Removal Tun Cv Cath W/O FL  Result Date: 03/04/2023 INDICATION: Patient with bacterial endocarditis, dialysis dependent via right IJ tunnel dialysis catheter placed at outside hospital. Request is made for removal for line holiday. EXAM: REMOVAL TUNNELED CENTRAL VENOUS CATHETER MEDICATIONS: 5 mL 1% lidocaine ANESTHESIA/SEDATION: None FLUOROSCOPY TIME:  None COMPLICATIONS: None immediate. PROCEDURE: Informed written consent  was obtained from the patient after a thorough discussion of the procedural risks, benefits and alternatives. All questions were addressed. Maximal Sterile Barrier Technique was utilized including caps, mask, sterile gowns, sterile gloves, sterile drape, hand hygiene and skin antiseptic. A timeout was performed prior to the initiation of the procedure. The patient's right chest and catheter was prepped and draped in a normal sterile fashion. Heparin was removed from both ports of catheter. 1% lidocaine was used for local anesthesia. Using gentle blunt dissection the cuff of the catheter was exposed and the catheter was removed in it's entirety. Pressure was held till hemostasis was obtained. A sterile dressing was applied. The patient tolerated the procedure well with no immediate complications. IMPRESSION: Successful catheter removal as described above. Read by: Loyce Dys PA-C Electronically Signed   By: Simonne Come M.D.   On: 03/04/2023 11:10   DG Chest Portable 1 View  Result Date: 03/01/2023 CLINICAL DATA:  Chest pain. EXAM: PORTABLE CHEST 1 VIEW COMPARISON:  05/10/2018 FINDINGS: Mild cardiomegaly is seen. Prior CABG noted. Right jugular dual-lumen central venous catheter is seen with TIPS overlying the right atrium. No evidence of pneumothorax. Both lungs clear.  No evidence of pleural effusion. IMPRESSION: Mild cardiomegaly. No active lung disease. Electronically Signed   By: Danae Orleans M.D.   On: 03/01/2023 17:50    Labs:  CBC: Recent Labs    03/07/23 0552 03/08/23 0323 03/09/23 0734 03/10/23 1147  WBC 6.4 6.2 7.4 6.5  HGB 9.7* 9.8* 10.5* 10.2*  HCT 30.5* 32.3* 33.8* 33.3*  PLT 185 205 217 222    COAGS: Recent Labs    03/08/23 2310 03/09/23 1425  APTT 46* 77*    BMP: Recent Labs    03/07/23 0552 03/08/23 0323 03/09/23 0734 03/10/23 1147  NA 132* 135 134* 138  K 5.8* 4.6 5.2* 4.9  CL 93* 93* 95* 98  CO2 21* 26 23 25   GLUCOSE 106* 81 94 143*  BUN 91* 45* 70* 62*   CALCIUM 8.4* 8.7* 8.6* 8.8*  CREATININE 13.89* 9.19* 11.39* 9.45*  GFRNONAA 4* 6* 5* 6*    LIVER FUNCTION TESTS: Recent Labs    03/01/23 1730 03/01/23 2000 03/02/23 0415 03/03/23 0409 03/06/23 0750 03/07/23 0552 03/08/23 0323 03/10/23 1147  BILITOT 1.0 0.9  --  0.5  --   --   --   --   AST 39 46*  --  30  --   --   --   --   ALT 24 25  --  24  --   --   --   --   ALKPHOS 76 83  --  76  --   --   --   --   PROT 8.2* 8.3*  --  8.2*  --   --   --   --   ALBUMIN 3.3* 3.4*   < > 3.4* 3.1* 3.1* 2.8* 2.9*   < > = values in this interval not displayed.     Assessment and Plan: ESRD Needs durable HD access. Plan for conversion of (R)IJ temp to tunneled HD cath tomorrow. NPO p MN Risks and benefits of image guided tunneled HD catheter placement was discussed with the patient including, but not limited to bleeding, infection, pneumothorax, or fibrin sheath development and need for additional procedures.  All of the patient's questions were answered, patient is agreeable to proceed. Consent signed and in chart.    Electronically Signed: Brayton El, PA-C 03/10/2023, 3:47 PM   I spent a total of 20 minutes in face to face in clinical consultation, greater than 50% of which was counseling/coordinating care for Permcath

## 2023-03-10 NOTE — Progress Notes (Signed)
Received patient in bed to unit.  Alert and oriented.  Informed consent signed and in chart.   TX duration:2:52  Patient didn't tolerated well.  Transported back to the room  Alert, without acute distress.  Hand-off given to patient's nurse.   Access used: right [facility] Access issues: towards end wouldn't run  Total UF removed: +200 Medication(s) given: vancomycin, tylenol   03/10/23 1600  Vitals  Temp 97.7 F (36.5 C)  Temp Source Oral  BP 119/69  MAP (mmHg) 82  BP Location Right Arm  BP Method Automatic  Patient Position (if appropriate) Lying  Pulse Rate 85  Pulse Rate Source Monitor  ECG Heart Rate 85  Resp 13  Oxygen Therapy  SpO2 100 %  O2 Device Room Air  During Treatment Monitoring  HD Safety Checks Performed Yes  Intra-Hemodialysis Comments  (tx ended 9 minutes left catheter wouldnt run.)  Dialysis Fluid Bolus Normal Saline  Bolus Amount (mL) 300 mL      Tanner Vigna S [facility]

## 2023-03-11 ENCOUNTER — Inpatient Hospital Stay (HOSPITAL_COMMUNITY): Payer: 59

## 2023-03-11 DIAGNOSIS — I10 Essential (primary) hypertension: Secondary | ICD-10-CM

## 2023-03-11 DIAGNOSIS — N186 End stage renal disease: Secondary | ICD-10-CM | POA: Diagnosis not present

## 2023-03-11 DIAGNOSIS — I358 Other nonrheumatic aortic valve disorders: Secondary | ICD-10-CM | POA: Diagnosis not present

## 2023-03-11 DIAGNOSIS — I480 Paroxysmal atrial fibrillation: Secondary | ICD-10-CM | POA: Diagnosis not present

## 2023-03-11 HISTORY — PX: IR FLUORO GUIDE CV LINE RIGHT: IMG2283

## 2023-03-11 MED ORDER — MIDAZOLAM HCL 2 MG/2ML IJ SOLN
INTRAMUSCULAR | Status: AC
  Filled 2023-03-11: qty 2

## 2023-03-11 MED ORDER — ONDANSETRON HCL 4 MG/2ML IJ SOLN
INTRAMUSCULAR | Status: AC
  Filled 2023-03-11: qty 2

## 2023-03-11 MED ORDER — LIDOCAINE-EPINEPHRINE 1 %-1:100000 IJ SOLN
INTRAMUSCULAR | Status: AC
  Filled 2023-03-11: qty 1

## 2023-03-11 MED ORDER — HYDROMORPHONE HCL 1 MG/ML IJ SOLN
INTRAMUSCULAR | Status: AC | PRN
  Administered 2023-03-11: 0.5 mg via INTRAVENOUS
  Administered 2023-03-11: .5 mg via INTRAVENOUS

## 2023-03-11 MED ORDER — VANCOMYCIN HCL 750 MG/150ML IV SOLN
INTRAVENOUS | Status: AC | PRN
  Administered 2023-03-11: 1000 mg via INTRAVENOUS

## 2023-03-11 MED ORDER — CHLORHEXIDINE GLUCONATE 4 % EX SOLN
CUTANEOUS | Status: AC
  Filled 2023-03-11: qty 15

## 2023-03-11 MED ORDER — HYDROMORPHONE HCL 1 MG/ML IJ SOLN
INTRAMUSCULAR | Status: AC
  Filled 2023-03-11: qty 1

## 2023-03-11 MED ORDER — CARVEDILOL 6.25 MG PO TABS
6.2500 mg | ORAL_TABLET | Freq: Two times a day (BID) | ORAL | Status: DC
  Administered 2023-03-11 – 2023-03-12 (×2): 6.25 mg via ORAL
  Filled 2023-03-11 (×2): qty 1

## 2023-03-11 MED ORDER — ALUM & MAG HYDROXIDE-SIMETH 200-200-20 MG/5ML PO SUSP
30.0000 mL | ORAL | Status: DC | PRN
  Administered 2023-03-11 – 2023-03-12 (×2): 30 mL via ORAL
  Filled 2023-03-11 (×2): qty 30

## 2023-03-11 MED ORDER — HEPARIN SODIUM (PORCINE) 1000 UNIT/ML IJ SOLN
INTRAMUSCULAR | Status: AC
  Filled 2023-03-11: qty 10

## 2023-03-11 MED ORDER — MIDAZOLAM HCL 2 MG/2ML IJ SOLN
INTRAMUSCULAR | Status: AC | PRN
  Administered 2023-03-11: 0.5 mg via INTRAVENOUS
  Administered 2023-03-11: .5 mg via INTRAVENOUS

## 2023-03-11 MED ORDER — VANCOMYCIN HCL IN DEXTROSE 1-5 GM/200ML-% IV SOLN
INTRAVENOUS | Status: AC
  Filled 2023-03-11: qty 200

## 2023-03-11 MED ORDER — PHENOL 1.4 % MT LIQD
1.0000 | OROMUCOSAL | Status: DC | PRN
  Filled 2023-03-11: qty 177

## 2023-03-11 MED ORDER — GELATIN ABSORBABLE 12-7 MM EX MISC
CUTANEOUS | Status: AC
  Filled 2023-03-11: qty 1

## 2023-03-11 MED ORDER — ONDANSETRON HCL 4 MG/2ML IJ SOLN
INTRAMUSCULAR | Status: AC | PRN
  Administered 2023-03-11: 4 mg via INTRAVENOUS

## 2023-03-11 NOTE — Evaluation (Signed)
Occupational Therapy Evaluation Patient Details Name: [name] MRN: [id number] DOB: [birth year] Today's Date: 03/11/2023   History of Present Illness 62 y/o M admitted to [facility] on 4/15 for chest pain. Non STEMI on 4/21 but pt declining LHC. R IJ tunneled HD cath placed 4/24. 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 Lives with his girlfriend and brother who can provide 24 hour care. Pt with significant pain post placement of IJ catheter, RN notified. Pt mobilizing and performing self care modified independently. No OT needs.      Recommendations for follow up therapy are one component of a multi-disciplinary discharge planning process, led by the attending physician.  Recommendations may be updated based on patient status, additional functional criteria and insurance authorization.   Assistance Recommended at Discharge PRN  Patient can return home with the following Assist for transportation    Functional Status Assessment  Patient has not had a recent decline in their functional status  Equipment Recommendations  None recommended by OT    Recommendations for Other Services       Precautions / Restrictions Precautions Precautions: None Restrictions Weight Bearing Restrictions: No      Mobility Bed Mobility               General bed mobility comments: pt seated EOB, ended session with pt seated EOB    Transfers Overall transfer level: Independent Equipment used: None                      Balance                                           ADL either performed or assessed with clinical judgement   ADL Overall ADL's : Modified independent                                              Vision Ability to See in Adequate Light: 0 Adequate Patient Visual Report: No change from baseline       Perception     Praxis      Pertinent Vitals/Pain Pain Assessment Pain Assessment: Faces Faces Pain Scale: Hurts whole lot Pain Location: throat Pain Descriptors / Indicators: Aching, Sore Pain Intervention(s): Monitored during session, Patient requesting pain meds-RN notified     Hand Dominance Right   Extremity/Trunk Assessment Upper Extremity Assessment Upper Extremity Assessment: Overall WFL for tasks assessed   Lower Extremity Assessment Lower Extremity Assessment: Defer to PT evaluation   Cervical / Trunk Assessment Cervical / Trunk Assessment: Normal   Communication Communication Communication: No difficulties   Cognition Arousal/Alertness: Awake/alert Behavior During Therapy: Flat affect Overall Cognitive Status: Within Functional Limits for tasks assessed                                       General Comments  VSS on room air    Exercises     Shoulder  Instructions      Home Living Family/patient expects to be discharged to:: Private residence Living Arrangements: Spouse/significant other;Other relatives Available Help at Discharge: Family;Available 24 hours/day Type of Home: House Home Access: Stairs to enter [employer] of Steps: 3 Entrance Stairs-Rails: Right;Left;Can reach both Home Layout: One level     Bathroom Shower/Tub: Chief Strategy Officer: Standard     Home Equipment: None   Additional Comments: lives with girlfriend and brother      Prior Functioning/Environment Prior Level of Function : Independent/Modified Independent;Driving             Mobility Comments: independent with all mobility, drives, denies any falls          OT Problem List:        OT Treatment/Interventions:      OT Goals(Current goals can be found in the  care plan section)    OT Frequency:      Co-evaluation              AM-PAC OT "6 Clicks" Daily Activity     Outcome Measure Help from another person eating meals?: None Help from another person taking care of personal grooming?: None Help from another person toileting, which includes using toliet, bedpan, or urinal?: None Help from another person bathing (including washing, rinsing, drying)?: None Help from another person to put on and taking off regular upper body clothing?: None Help from another person to put on and taking off regular lower body clothing?: None 6 Click Score: 24   End of Session Nurse Communication: Patient requests pain meds  Activity Tolerance: Patient tolerated treatment well Patient left: in bed;with call bell/phone within reach  OT Visit Diagnosis: Pain                Time: 1610-9604 OT Time Calculation (min): 16 min Charges:  OT General Charges $OT Visit: 1 Visit OT Evaluation $OT Eval Low Complexity: 1 Low  Berna Spare, OTR/L Acute Rehabilitation Services Office: [phone]   Evern Bio 03/11/2023, 2:53 PM

## 2023-03-11 NOTE — Sedation Documentation (Addendum)
Patient continues to flail on procedure table. Attached to cardiac monitor and sedation given.

## 2023-03-11 NOTE — Evaluation (Signed)
Physical Therapy Evaluation Patient Details Name: [name] MRN: [id number] DOB: [birth year] Today's Date: 03/11/2023  History of Present Illness  62 y/o M admitted to [facility] on 4/15 for chest pain. Non STEMI on 4/21 but pt declining LHC. R IJ tunneled HD cath placed 4/24. 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 Pt reports ambulating to the bathroom during admission, able to ambulate in the hallway and perform stair trial with modI, increased time due to decreased gait speed. Pt reports no concerns with mobility upon discharge, has full time care at home and feels as if he is mobilizing at his baseline. Pt with no further PT needs identified at this time, no needs identified at discharge. PT will sign off at this time.        Recommendations for follow up therapy are one component of a multi-disciplinary discharge planning process, led by the attending physician.  Recommendations may be updated based on patient status, additional functional criteria and insurance authorization.  Follow Up Recommendations       Assistance Recommended at Discharge PRN  Patient can return home with the following  Assist for transportation    Equipment Recommendations None recommended by PT  Recommendations for Other Services       Functional Status Assessment Patient has had a recent decline in their functional status and demonstrates the ability to make significant improvements in function in a reasonable and predictable amount of time.     Precautions / Restrictions Precautions Precautions: None Restrictions Weight Bearing Restrictions: No      Mobility  Bed Mobility                General bed mobility comments: pt seated EOB, ended session with pt seated EOB    Transfers Overall transfer level: Independent Equipment used: None                    Ambulation/Gait Ambulation/Gait assistance: Modified independent (Device/Increase time) Gait Distance (Feet): 225 Feet Assistive device: None Gait Pattern/deviations: Step-through pattern, Decreased stride length Gait velocity: decreased     General Gait Details: decreased gait speed, but pt reporting close to his baseline, decreased B foot clearance but pt wearing slides for shoes  Stairs Stairs: Yes Stairs assistance: Modified independent (Device/Increase time) Stair Management: One rail Right, Alternating pattern, Forwards Number of Stairs: 3 General stair comments: completing without issue  Wheelchair Mobility    Modified Rankin (Stroke Patients Only)       Balance Overall balance assessment: Mild deficits observed, not formally tested                                           Pertinent Vitals/Pain Pain Assessment Pain Assessment: Faces Faces Pain Scale: Hurts a little bit Pain Location: neck after HD cath placed Pain Descriptors / Indicators: Aching Pain Intervention(s): Limited activity within patient's tolerance    Home Living Family/patient expects to be discharged to:: Private residence Living Arrangements: Spouse/significant other;Other relatives Available Help at Discharge: Family;Available 24 hours/day Type of Home: House Home Access: Stairs to enter Entrance Stairs-Rails: Right;Left;Can reach both Entrance Stairs-Number of  Steps: 3   Home Layout: One level Home Equipment: None Additional Comments: lives with girlfriend and brother    Prior Function Prior Level of Function : Independent/Modified Independent;Driving             Mobility Comments: independent with all mobility, drives, denies any falls       Hand Dominance   Dominant  Hand: Right    Extremity/Trunk Assessment   Upper Extremity Assessment Upper Extremity Assessment: Defer to OT evaluation    Lower Extremity Assessment Lower Extremity Assessment: Overall WFL for tasks assessed    Cervical / Trunk Assessment Cervical / Trunk Assessment: Normal  Communication   Communication: No difficulties  Cognition Arousal/Alertness: Awake/alert Behavior During Therapy: Flat affect Overall Cognitive Status: Within Functional Limits for tasks assessed                                 General Comments: A&Ox4, following commands well        General Comments General comments (skin integrity, edema, etc.): VSS on room air    Exercises     Assessment/Plan    PT Assessment Patient does not need any further PT services  PT Problem List         PT Treatment Interventions      PT Goals (Current goals can be found in the Care Plan section)  Acute Rehab PT Goals Patient Stated Goal: go home PT Goal Formulation: All assessment and education complete, DC therapy    Frequency       Co-evaluation               AM-PAC PT "6 Clicks" Mobility  Outcome Measure Help needed turning from your back to your side while in a flat bed without using bedrails?: None Help needed moving from lying on your back to sitting on the side of a flat bed without using bedrails?: None Help needed moving to and from a bed to a chair (including a wheelchair)?: None Help needed standing up from a chair using your arms (e.g., wheelchair or bedside chair)?: None Help needed to walk in hospital room?: None Help needed climbing 3-5 steps with a railing? : None 6 Click Score: 24    End of Session   Activity Tolerance: Patient tolerated treatment well Patient left: in bed;with call bell/phone within reach Nurse Communication: Mobility status PT Visit Diagnosis: Unsteadiness on feet (R26.81)    Time: 4098-1191 PT Time Calculation (min) (ACUTE ONLY): 17  min   Charges:   PT Evaluation $PT Eval Low Complexity: 1 Low          Lindalou Hose, PT DPT Acute Rehabilitation Services Office [phone]   Leonie Man 03/11/2023, 1:58 PM

## 2023-03-11 NOTE — Procedures (Signed)
Interventional Radiology Procedure Note  Procedure: Placement of a right IJ approach tunneled HD cath.   Tip is positioned at the superior cavoatrial junction and catheter is ready for immediate use.   Complications: None  Recommendations:  - Ok to use - Do not submerge - Routine line care  - Patient has no working IV. The peri-procedural abx will be infused via the HD, and the VIR team will hep lock after the infusion.  - ok to advance diet per primary order  Signed,  Yvone Neu. Loreta Ave, DO

## 2023-03-11 NOTE — Progress Notes (Addendum)
Subjective: Seen in room minimal discomfort @  new  right IJ TDC this a.m. by IR/HD yesterday on schedule tolerated  Objective Vital signs in last 24 hours: Vitals:   03/11/23 0429 03/11/23 0900 03/11/23 0915 03/11/23 0930  BP: 117/79 (!) 117/93 (!) 150/107 (!) 105/50  Pulse:  76 85 77  Resp: Temp:      TempSrc:      SpO2:  100% 100% 100%  Weight:      Height:       Weight change: 1.6 kg   Physical Exam: General: Chronically ill adult male, NAD Heart: RRR, no MRG Lungs: CTA bilaterally nonlabored breathing Abdomen: NABS, soft NTND Extremities: No pedal edema  Dialysis Access: Right IJ TDC dressing dry clear   OP Dialysis Orders: TTS [place]/ [place] [facility] 4h  420/ 800   68kg   2/2/ bath  Heparin ?  RIJ TDC   Problem/Plan: AoV endocarditis - vegetation on tip of TDC: Blood Cx negative from early April at [facility]. TDC removed 4/17. Roderick Pee placed 4/20, right IJ TDC placed this morning, on Vancomycin + Cefepime with HD x 6 week course. ESRD: Usual TTS schedule -off schedule secondary to the HD cath removed .// HD yesterday 4/23 on schedule now ,also right IJ temporary cath exchanged for  R IJ TDC this a.m. Next HD planned for tomorrow HTN/volume: BP variable a lot beginning of the week secondary to intermittent agitation, BP this a.m. 105/50.  Taper dose carvedilol, hold Imdur for systolic under 120// calm not agitated / Noted  on midodrine now in hospital, appears close to euvolemic, well below prior EDW. Will lower EDW at discharge.  Chest x-ray 4/14 no active lung disease Anemia of ESRD: . Hgb 10.5 <9.8 was .(However because of infection no IV iron yet) monitor Hgb trend to resume ESA as needed none currently Secondary HPTH: Corr Ca ok and phos 5.6  - continue sevelamer as binder. HIV: Continue home meds  Lenny Pastel, PA-C [facility] Beeper [phone] 03/11/2023,11:11 AM  LOS: 9 days   Patient seen and examined, agree with above note  with above modifications. He feels well s/p new [facility]-  plan is for HD in AM then to be discharged-  will need to communicate with his OP unit so that he will cont to get the abx Annie Sable, MD 03/11/2023    Labs: Basic Metabolic Panel: Recent Labs  Lab 03/07/23 0552 03/08/23 0323 03/09/23 0734 03/10/23 1147  NA 132* 135 134* 138  K 5.8* 4.6 5.2* 4.9  CL 93* 93* 95* 98  CO2 21* GLUCOSE 106* 81 94 143*  BUN 91* 45* 70* 62*  CREATININE 13.89* 9.19* 11.39* 9.45*  CALCIUM 8.4* 8.7* 8.6* 8.8*  PHOS 8.2* 5.8*  --  5.6*   Liver Function Tests: Recent Labs  Lab 03/07/23 0552 03/08/23 0323 03/10/23 1147  ALBUMIN 3.1* 2.8* 2.9*   No results for input(s): "LIPASE", "AMYLASE" in the last 168 hours. No results for input(s): "AMMONIA" in the last 168 hours. CBC: Recent Labs  Lab 03/06/23 0750 03/07/23 0552 03/08/23 0323 03/09/23 0734 03/10/23 1147  WBC 7.3 6.4 6.2 7.4 6.5  NEUTROABS  --  3.2  --  3.6  --   HGB 11.4* 9.7* 9.8* 10.5* 10.2*  HCT 37.5* 30.5* 32.3* 33.8* 33.3*  MCV 90.1 88.7 91.2 89.4 89.8  PLT 233 185 205 217 222   Cardiac Enzymes: No results for input(s): "CKTOTAL", "CKMB", "  CKMBINDEX", "TROPONINI" in the last 168 hours. CBG: Recent Labs  Lab 03/07/23 1032  GLUCAP 167*    Studies/Results: No results found. Medications:  ceFEPime (MAXIPIME) IV 1 g (03/10/23 2136)   vancomycin 750 mg (03/10/23 1453)    amiodarone  200 mg Oral Daily   amitriptyline  10 mg Oral QHS   apixaban  5 mg Oral BID   atorvastatin  80 mg Oral Daily   carvedilol  12.5 mg Oral BID WC   Chlorhexidine Gluconate Cloth  6 each Topical Daily   clopidogrel  75 mg Oral Daily   dapsone  100 mg Oral Daily   dolutegravir-rilpivirine  1 tablet Oral Q lunch   heparin sodium (porcine)  2,600 Units Intracatheter Once   isosorbide mononitrate  30 mg Oral Daily   midodrine  10 mg Oral TID WC   mirtazapine  7.5 mg Oral QHS   mupirocin ointment   Topical BID   sevelamer  carbonate  2,400 mg Oral TID WC   sodium chloride flush  10-40 mL Intracatheter Q12H

## 2023-03-11 NOTE — Sedation Documentation (Signed)
This RN at the patients bedside to take in for Tunneled HD when patient complains of sudden onset abdominal pain with nausea. No accompanied vomiting. Patient states " I am hurting in my belly and going to be sick". Given emesis bag. Will give 4 mg of zofran per orders.

## 2023-03-11 NOTE — Progress Notes (Addendum)
Contacted by CSW regarding pt's possible d/c tomorrow. Contacted [facility] [phone]) and left message for clinic manger. Awaiting a return call to discuss and confirm pt's iv abx needs at d/c. Navigator faxed clinicals to clinic last week to notate pt's needs but will confirm info received. Will assist as needed.   Olivia Canter Renal Navigator [phone]  Addendum at 3:57 pm: Spoke to Tresa Endo, clinic Production designer, theatre/television/film, at pt's home clinic. Tresa Endo confirms receipt of pt's clinicals last week and clinic is prepared to provide iv abx with pt's HD at d/c. Clinic advised that pt for likely d/c tomorrow and pt would resume on Saturday. Will contact clinic on day of d/c to confirm d/c and will fax d/c summary for continuation of care.

## 2023-03-11 NOTE — Progress Notes (Addendum)
PROGRESS NOTE        PATIENT DETAILS Name: [name] Age: 62 y.o. Sex: male Date of Birth: 1961/11/16 Admit Date: 03/02/2023 Admitting Physician Mir Sharlette Dense, MD ZOX:[id number], Lacretia Leigh, MD  Brief Summary: Patient is a 62 y.o.  male with history of HIV, ESRD on HD TTS, CAD s/p four-vessel CABG in 2022-s/p PCI to SVG-rPDA 12/2022, PAF who was admitted to [facility] hospital for workup of chest pain and found to have aortic/mitral valve vegetation on 2D echocardiogram-he was subsequently transferred to [facility] but signed out repeatedly on 4/11, 4/13, 4/14-from [facility]-and again from [facility] on 4/15-presented back to [facility] on 4/15 with chest pain and was subsequently admitted for ongoing evaluation.  Unfortunately-Hospital course complicated by noncompliance/agitation/combativeness-and non-STEMI.  See below for further details.  Significant events: 4/15>> admitted to [facility] for chest pain. 4/21>> non-STEMI-declined LHC.  Significant studies: 4/14>> CXR: No active lung disease  Significant microbiology data: None  Procedures: 4/17>> TDC removed 4/20>> placement of temporary triple-lumen HD catheter.  Consults: Cardiology Nephrology Infectious disease Interval Radiology Psychiatry  Subjective: Lying comfortably in bed-denies any chest pain or shortness of breath.  Objective: Vitals: Blood pressure 117/79, pulse 68, temperature 97.9 F (36.6 C), temperature source Oral, resp. rate 18, height  (1.651 m), weight 65.6 kg, SpO2 97 %.   Exam: Gen Exam:Alert awake-not in any distress HEENT:atraumatic, normocephalic Chest: B/L clear to auscultation anteriorly CVS:S1S2 regular Abdomen:soft non tender, non distended Extremities:no edema Neurology: Non focal Skin: no rash  Pertinent Labs/Radiology:    Latest Ref Rng & Units 03/10/2023   11:47 AM 03/09/2023    7:34 AM 03/08/2023    3:23 AM  CBC  WBC 4.0 - 10.5 K/uL 6.5  7.4  6.2   Hemoglobin  13.0 - 17.0 g/dL 09.8  11.9  9.8   Hematocrit 39.0 - 52.0 % 33.3  33.8  32.3   Platelets 150 - 400 K/uL 222  217  205     Lab Results  Component Value Date   NA 138 03/10/2023   K 4.9 03/10/2023   CL 98 03/10/2023   CO2 25 03/10/2023      Assessment/Plan: Aortic valve endocarditis All cultures at [facility] including [facility] test negative  Given Line holiday for the past several days-for [facility] placement today Vanco/cefepime x 6 weeks planned (end date 5/28).  Hopefully once [facility] is placed-and he completes his usual HD tomorrow-we can then get him back to his home in [place] tomorrow.   SRD on HD TTS Nephrology following and directing care  Normocytic anemia Secondary to ESRD Aranesp/IV iron per nephrology  Non-STEMI History of CAD s/p four-vessel CABG in 2022-s/p PCI to SVG-rPDA 12/2022 Refused LHC No chest pain overnight Continue Plavix/beta-blocker/statin/Imdur  PAF Sinus rhythm Eliquis/amiodarone/Coreg  HFpEF Euvolemic Volume removal with HD  HTN BP relatively stable On midodrine Beta-blocker/Imdur being continued for CAD  HIV Continue antiretrovirals  Mood disorder Stable Amitriptyline/Remeron  Chronic pain syndrome Reviewed PDMP website-appears to be on oxycodone 10 mg chronically  Known plan/refusal of care/signing out AMA multiple times from different facilities Counseled-he is aware of the life-threatening/life disabling consequences of such behavior.  BMI: Estimated body mass index is 24.07 kg/m as calculated from the following:   Height as of this encounter:  (1.651 m).   Weight as of this encounter: 65.6 kg.   Code  status:   Code Status: Full Code   DVT Prophylaxis: SCDs Start: 03/02/23 1431 apixaban (ELIQUIS) tablet 5 mg    Family Communication: None at bedside   Disposition Plan: Status is: Inpatient Remains inpatient appropriate because: Severity of illness   Planned Discharge Destination:Home hopefully on 4/25   Diet: Diet  Order             Diet NPO time specified Except for: Sips with Meds  Diet effective midnight                     Antimicrobial agents: Anti-infectives (From admission, onward)    Start     Dose/Rate Route Frequency Ordered Stop   03/10/23 1300  vancomycin (VANCOREADY) IVPB 750 mg/150 mL        750 mg 150 mL/hr over 60 Minutes Intravenous Every T-Th-Sa (Hemodialysis) 03/10/23 1211     03/09/23 0945  vancomycin (VANCOREADY) IVPB 750 mg/150 mL        750 mg 150 mL/hr over 60 Minutes Intravenous STAT 03/09/23 0937 03/09/23 1145   03/08/23 1315  vancomycin (VANCOREADY) IVPB 500 mg/100 mL        500 mg 100 mL/hr over 60 Minutes Intravenous  Once 03/08/23 1216 03/08/23 1759   03/06/23 1200  ceFEPIme (MAXIPIME) 2 g in sodium chloride 0.9 % 100 mL IVPB  Status:  Discontinued        2 g 200 mL/hr over 30 Minutes Intravenous Every M-W-F (Hemodialysis) 03/05/23 0802 03/05/23 0939   03/05/23 2200  ceFEPIme (MAXIPIME) 1 g in sodium chloride 0.9 % 100 mL IVPB        1 g 200 mL/hr over 30 Minutes Intravenous Every 24 hours 03/05/23 0939     03/05/23 0000  dolutegravir-rilpivirine (JULUCA) 50-25 MG tablet        1 tablet Oral Daily with lunch 03/05/23 1301 04/05/23 2359   03/05/23 0000  ceFEPime (MAXIPIME) IVPB        2 g Intravenous Every T-Th-Sa (Hemodialysis) 03/05/23 1440 04/14/23 2359   03/05/23 0000  vancomycin IVPB        750 mg Intravenous Every T-Th-Sa (Hemodialysis) 03/05/23 1440 04/14/23 2359   03/04/23 1200  vancomycin (VANCOCIN) IVPB 1000 mg/200 mL premix  Status:  Discontinued        1,000 mg 200 mL/hr over 60 Minutes Intravenous On call 03/04/23 1110 03/04/23 1114   03/04/23 1000  ceFEPIme (MAXIPIME) 2 g in sodium chloride 0.9 % 100 mL IVPB        2 g 200 mL/hr over 30 Minutes Intravenous  Once 03/04/23 0904 03/04/23 1106   03/03/23 2215  linezolid (ZYVOX) tablet 600 mg        600 mg Oral  Once 03/03/23 2125 03/03/23 2233   03/03/23 1700  vancomycin (VANCOREADY) IVPB 1250  mg/250 mL        1,250 mg 166.7 mL/hr over 90 Minutes Intravenous  Once 03/03/23 1506 03/03/23 1911   03/03/23 1600  vancomycin (VANCOREADY) IVPB 750 mg/150 mL  Status:  Discontinued        750 mg 150 mL/hr over 60 Minutes Intravenous  Once 03/03/23 1447 03/03/23 1506   03/03/23 1230  vancomycin (VANCOREADY) IVPB 750 mg/150 mL        750 mg 150 mL/hr over 60 Minutes Intravenous  Once 03/03/23 1133 03/03/23 1315   03/03/23 1200  dolutegravir-rilpivirine (JULUCA) 50-25 MG per tablet 1 tablet        1 tablet Oral Daily  with lunch 03/03/23 1113     03/03/23 1200  dapsone tablet 100 mg        100 mg Oral Daily 03/03/23 1113     03/03/23 0930  vancomycin (VANCOREADY) IVPB 750 mg/150 mL  Status:  Discontinued        750 mg 150 mL/hr over 60 Minutes Intravenous  Once 03/03/23 0922 03/03/23 1133   03/03/23 0921  vancomycin variable dose per unstable renal function (pharmacist dosing)  Status:  Discontinued         Does not apply See admin instructions 03/03/23 0922 03/10/23 1211   03/03/23 0915  cefTRIAXone (ROCEPHIN) 2 g in sodium chloride 0.9 % 100 mL IVPB  Status:  Discontinued        2 g 200 mL/hr over 30 Minutes Intravenous Every 24 hours 03/03/23 0909 03/04/23 0904        MEDICATIONS: Scheduled Meds:  amiodarone  200 mg Oral Daily   amitriptyline  10 mg Oral QHS   apixaban  5 mg Oral BID   atorvastatin  80 mg Oral Daily   carvedilol  12.5 mg Oral BID WC   Chlorhexidine Gluconate Cloth  6 each Topical Daily   clopidogrel  75 mg Oral Daily   dapsone  100 mg Oral Daily   dolutegravir-rilpivirine  1 tablet Oral Q lunch   heparin sodium (porcine)  2,600 Units Intracatheter Once   isosorbide mononitrate  30 mg Oral Daily   midodrine  10 mg Oral TID WC   mirtazapine  7.5 mg Oral QHS   mupirocin ointment   Topical BID   sevelamer carbonate  2,400 mg Oral TID WC   sodium chloride flush  10-40 mL Intracatheter Q12H   Continuous Infusions:  ceFEPime (MAXIPIME) IV 1 g (03/10/23 2136)    vancomycin 750 mg (03/10/23 1453)   PRN Meds:.acetaminophen **OR** acetaminophen, albuterol, HYDROmorphone (DILAUDID) injection, LORazepam, nitroGLYCERIN, ondansetron **OR** ondansetron (ZOFRAN) IV, oxyCODONE, senna-docusate, sevelamer carbonate, traMADol, traZODone   I have personally reviewed following labs and imaging studies  LABORATORY DATA: CBC: Recent Labs  Lab 03/06/23 0750 03/07/23 0552 03/08/23 0323 03/09/23 0734 03/10/23 1147  WBC 7.3 6.4 6.2 7.4 6.5  NEUTROABS  --  3.2  --  3.6  --   HGB 11.4* 9.7* 9.8* 10.5* 10.2*  HCT 37.5* 30.5* 32.3* 33.8* 33.3*  MCV 90.1 88.7 91.2 89.4 89.8  PLT 233 185 205 217 222    Basic Metabolic Panel: Recent Labs  Lab 03/06/23 0750 03/06/23 2036 03/07/23 0552 03/08/23 0323 03/09/23 0734 03/10/23 1147  NA 134*  --  132* 135 134* 138  K 6.1* 6.2* 5.8* 4.6 5.2* 4.9  CL 92*  --  93* 93* 95* 98  CO2 22  --  21* GLUCOSE 90  --  106* 81 94 143*  BUN 72*  --  91* 45* 70* 62*  CREATININE 11.56*  --  13.89* 9.19* 11.39* 9.45*  CALCIUM 8.7*  --  8.4* 8.7* 8.6* 8.8*  PHOS 7.5*  --  8.2* 5.8*  --  5.6*    GFR: Estimated Creatinine Clearance: 7.1 mL/min (A) (by C-G formula based on SCr of 9.45 mg/dL (H)).  Liver Function Tests: Recent Labs  Lab 03/06/23 0750 03/07/23 0552 03/08/23 0323 03/10/23 1147  ALBUMIN 3.1* 3.1* 2.8* 2.9*   No results for input(s): "LIPASE", "AMYLASE" in the last 168 hours. No results for input(s): "AMMONIA" in the last 168 hours.  Coagulation Profile: No results for input(s): "INR", "PROTIME"  in the last 168 hours.  Cardiac Enzymes: No results for input(s): "CKTOTAL", "CKMB", "CKMBINDEX", "TROPONINI" in the last 168 hours.  BNP (last 3 results) No results for input(s): "PROBNP" in the last 8760 hours.  Lipid Profile: No results for input(s): "CHOL", "HDL", "LDLCALC", "TRIG", "CHOLHDL", "LDLDIRECT" in the last 72 hours.  Thyroid Function Tests: No results for input(s): "TSH",  "T4TOTAL", "FREET4", "T3FREE", "THYROIDAB" in the last 72 hours.  Anemia Panel: No results for input(s): "VITAMINB12", "FOLATE", "FERRITIN", "TIBC", "IRON", "RETICCTPCT" in the last 72 hours.  Urine analysis:    Component Value Date/Time   COLORURINE YELLOW 05/11/2018 0606   APPEARANCEUR HAZY (A) 05/11/2018 0606   LABSPEC 1.012 05/11/2018 0606   PHURINE 5.0 05/11/2018 0606   GLUCOSEU NEGATIVE 05/11/2018 0606   GLUCOSEU NEG mg/dL 16/08/9603 5409   HGBUR MODERATE (A) 05/11/2018 0606   BILIRUBINUR NEGATIVE 05/11/2018 0606   KETONESUR NEGATIVE 05/11/2018 0606   PROTEINUR >=300 (A) 05/11/2018 0606   UROBILINOGEN 0.2 11/23/2006 2036   NITRITE NEGATIVE 05/11/2018 0606   LEUKOCYTESUR NEGATIVE 05/11/2018 0606    Sepsis Labs: Lactic Acid, Venous No results found for: "LATICACIDVEN"  MICROBIOLOGY: Recent Results (from the past 240 hour(s))  Blood culture (routine x 2)     Status: None   Collection Time: 03/01/23  8:00 PM   Specimen: BLOOD  Result Value Ref Range Status   Specimen Description BLOOD LEFT ANTECUBITAL  Final   Special Requests   Final    BOTTLES DRAWN AEROBIC AND ANAEROBIC Blood Culture adequate volume   Culture   Final    NO GROWTH 5 DAYS Performed at [facility], [address] [place], [place] [address]    Report Status 03/06/2023 FINAL  Final  Blood culture (routine x 2)     Status: None   Collection Time: 03/01/23  8:20 PM   Specimen: BLOOD RIGHT HAND  Result Value Ref Range Status   Specimen Description BLOOD RIGHT HAND  Final   Special Requests   Final    BOTTLES DRAWN AEROBIC AND ANAEROBIC Blood Culture adequate volume   Culture   Final    NO GROWTH 5 DAYS Performed at [facility], [address], [place], [place] [address]    Report Status 03/06/2023 FINAL  Final    RADIOLOGY STUDIES/RESULTS: No results found.   LOS: 9 days   Jeoffrey Massed, MD  [facility]    To contact the attending provider between 7A-7P or the  covering provider during after hours 7P-7A, please log into the web site [email or web address] and access using universal [place] password for that web site. If you do not have the password, please call the hospital operator.  03/11/2023, 8:18 AM

## 2023-03-12 ENCOUNTER — Other Ambulatory Visit (HOSPITAL_COMMUNITY): Payer: Self-pay

## 2023-03-12 DIAGNOSIS — N186 End stage renal disease: Secondary | ICD-10-CM | POA: Diagnosis not present

## 2023-03-12 DIAGNOSIS — I480 Paroxysmal atrial fibrillation: Secondary | ICD-10-CM | POA: Diagnosis not present

## 2023-03-12 DIAGNOSIS — I10 Essential (primary) hypertension: Secondary | ICD-10-CM | POA: Diagnosis not present

## 2023-03-12 DIAGNOSIS — I358 Other nonrheumatic aortic valve disorders: Secondary | ICD-10-CM | POA: Diagnosis not present

## 2023-03-12 LAB — CBC
HCT: 30.9 % — ABNORMAL LOW (ref 39.0–52.0)
Hemoglobin: 9.5 g/dL — ABNORMAL LOW (ref 13.0–17.0)
MCH: 27.9 pg (ref 26.0–34.0)
MCHC: 30.7 g/dL (ref 30.0–36.0)
MCV: 90.6 fL (ref 80.0–100.0)
Platelets: 196 10*3/uL (ref 150–400)
RBC: 3.41 MIL/uL — ABNORMAL LOW (ref 4.22–5.81)
RDW: 17 % — ABNORMAL HIGH (ref 11.5–15.5)
WBC: 6 10*3/uL (ref 4.0–10.5)
nRBC: 0 % (ref 0.0–0.2)

## 2023-03-12 LAB — RENAL FUNCTION PANEL
Albumin: 2.6 g/dL — ABNORMAL LOW (ref 3.5–5.0)
Anion gap: 14 (ref 5–15)
BUN: 72 mg/dL — ABNORMAL HIGH (ref 8–23)
CO2: 26 mmol/L (ref 22–32)
Calcium: 8.7 mg/dL — ABNORMAL LOW (ref 8.9–10.3)
Chloride: 96 mmol/L — ABNORMAL LOW (ref 98–111)
Creatinine, Ser: 9.54 mg/dL — ABNORMAL HIGH (ref 0.61–1.24)
GFR, Estimated: 6 mL/min — ABNORMAL LOW (ref >60)
Glucose, Bld: 104 mg/dL — ABNORMAL HIGH (ref 70–99)
Phosphorus: 4.8 mg/dL — ABNORMAL HIGH (ref 2.5–4.6)
Potassium: 4.9 mmol/L (ref 3.5–5.1)
Sodium: 136 mmol/L (ref 135–145)

## 2023-03-12 LAB — VANCOMYCIN, RANDOM: Vancomycin Rm: 25 ug/mL

## 2023-03-12 MED ORDER — JULUCA 50-25 MG PO TABS: 1.0000 | ORAL_TABLET | Freq: Every day | ORAL | 1 refills | Status: DC

## 2023-03-12 MED ORDER — JULUCA 50-25 MG PO TABS: 1.0000 | ORAL_TABLET | Freq: Every day | ORAL | 0 refills | Status: AC

## 2023-03-12 MED ORDER — ISOSORBIDE MONONITRATE ER 30 MG PO TB24
30.0000 mg | ORAL_TABLET | Freq: Every day | ORAL | 1 refills | Status: DC
  Filled 2023-03-12: qty 30, 30d supply, fill #0

## 2023-03-12 MED ORDER — SODIUM CHLORIDE 0.9 % IV SOLN: 2.0000 g | INTRAVENOUS | Status: DC

## 2023-03-12 MED ORDER — ANTICOAGULANT SODIUM CITRATE 4% (200MG/5ML) IV SOLN
5.0000 mL | Status: DC | PRN
  Filled 2023-03-12: qty 5

## 2023-03-12 MED ORDER — HEPARIN SODIUM (PORCINE) 1000 UNIT/ML DIALYSIS
1000.0000 [IU] | INTRAMUSCULAR | Status: DC | PRN
  Administered 2023-03-12: 1000 [IU]
  Filled 2023-03-12: qty 1

## 2023-03-12 MED ORDER — MIDODRINE HCL 10 MG PO TABS
10.0000 mg | ORAL_TABLET | Freq: Three times a day (TID) | ORAL | 1 refills | Status: DC
  Filled 2023-03-12: qty 90, 30d supply, fill #0

## 2023-03-12 MED ORDER — ALTEPLASE 2 MG IJ SOLR: 2.0000 mg | Freq: Once | INTRAMUSCULAR | Status: DC | PRN

## 2023-03-12 MED ORDER — CARVEDILOL 6.25 MG PO TABS
6.2500 mg | ORAL_TABLET | Freq: Two times a day (BID) | ORAL | 1 refills | Status: DC
  Filled 2023-03-12: qty 60, 30d supply, fill #0

## 2023-03-12 MED ORDER — JULUCA 50-25 MG PO TABS
1.0000 | ORAL_TABLET | Freq: Every day | ORAL | 0 refills | Status: DC
  Filled 2023-03-12 (×2): qty 30, 30d supply, fill #0

## 2023-03-12 NOTE — Progress Notes (Addendum)
Pt to d/c today. D/C summary faxed to pt's out-pt HD clinic for continuation of care. Spoke to pt via phone. Pt advised that pt's clinic is aware that pt will d/c today and resume on Saturday. Pt also advised that clinic is aware of pt's iv abx needs and to provide to pt with HD treatments. Pt voices understanding and agreeable to plan.   Olivia Canter Renal Navigator [phone]  Addendum at 10:38 am: Contacted clinic and staff confirm they received d/c summary. Clinic aware pt will d/c today and resume on Saturday.

## 2023-03-12 NOTE — Discharge Instructions (Addendum)

## 2023-03-12 NOTE — Progress Notes (Signed)
Patient refused am labs. 

## 2023-03-12 NOTE — Progress Notes (Signed)
Patient is refusing to go to [facility] without eating breakfast. Patient stated he would go after he ate. [facility] had set up and ready for patient. Patient will now be worked in for [facility] this afternoon per [employer]. Patient is aware.

## 2023-03-12 NOTE — Progress Notes (Signed)
Patient back from [facility] not to discharge home

## 2023-03-12 NOTE — Discharge Summary (Signed)
PATIENT DETAILS Name: [name] Age: 62 y.o. Sex: male Date of Birth: 07/13/1961 MRN: [id number]. Admitting Physician: [name] Sharlette Dense, MD ONG:[id number], [name] Leigh, MD  Admit Date: 03/02/2023 Discharge date: 03/12/2023  Recommendations for Outpatient Follow-up:  Follow up with PCP in 1-2 weeks Please obtain CMP/CBC in one week Vancomycin/Cefepime with HD until 5/28.  Admitted From:  Home  Disposition: Home   Discharge Condition: good  CODE STATUS:   Code Status: Full Code   Diet recommendation:  Diet Order             Diet - low sodium heart healthy           Diet renal with fluid restriction Fluid restriction: 1200 mL Fluid; Room service appropriate? Yes; Fluid consistency: Thin  Diet effective now                    Brief Summary: Patient is a 62 y.o.  male with history of HIV, ESRD on HD TTS, CAD s/p four-vessel CABG in 2022-s/p PCI to SVG-rPDA 12/2022, PAF who was admitted to [facility] hospital for workup of chest pain and found to have aortic/mitral valve vegetation on 2D echocardiogram-he was subsequently transferred to [facility] but signed out repeatedly on 4/11, 4/13, 4/14-from [facility]-and again from [facility] on 4/15-presented back to [facility] on 4/15 with chest pain and was subsequently admitted for ongoing evaluation.  Unfortunately-Hospital course complicated by noncompliance/agitation/combativeness-and non-STEMI.  See below for further details.   Significant events: 4/15>> admitted to [facility] for chest pain. 4/21>> non-STEMI-declined LHC.   Significant studies: 4/14>> CXR: No active lung disease   Significant microbiology data: None   Procedures: 4/17>> TDC removed 4/20>> placement of temporary triple-lumen HD catheter.   Consults: Cardiology Nephrology Infectious disease Interval Radiology Psychiatry  Brief Hospital Course: Aortic valve endocarditis All cultures at [facility] including K[place] test negative  Given Line holiday for the past  several days-TDC subsequently placed on 4/24 Plan is for him to get his HD today-subsequently will be discharged home with planned 6 weeks of vancomycin/cefepime (end date 5/28) TOC team has gotten in touch with his outpatient HD center-who is aware of the need for IV antibiotics with dialysis x 6 weeks.  Followed closely by infectious disease during this hospitalization.   SRD on HD TTS Nephrology following and directing care   Normocytic anemia Secondary to ESRD Aranesp/IV iron per outpatient nephrology   Non-STEMI History of CAD s/p four-vessel CABG in 2022-s/p PCI to SVG-rPDA 12/2022 Refused LHC No chest pain overnight Continue Plavix/beta-blocker/statin/Imdur   PAF Sinus rhythm Eliquis/amiodarone/Coreg   HFpEF Euvolemic Volume removal with HD   HTN BP relatively stable On midodrine Beta-blocker/Imdur being continued for CAD   HIV Continue antiretrovirals   Mood disorder Stable Amitriptyline/Remeron   Chronic pain syndrome Reviewed PDMP website-appears to be on oxycodone 10 mg chronically   Known plan/refusal of care/signing out AMA multiple times from different facilities Counseled-he is aware of the life-threatening/life disabling consequences of such behavior.   BMI: Estimated body mass index is 24.07 kg/m as calculated from the following:   Height as of this encounter:  (1.651 m).   Weight as of this encounter: 65.6 kg.   Discharge Diagnoses:  Principal Problem:   Aortic valve endocarditis Active Problems:   ESRD on hemodialysis   CHF (congestive heart failure)   Hypertension   Human immunodeficiency virus (HIV) disease (HCC)   Coronary artery disease   Paroxysmal atrial fibrillation   Depression  Discharge Instructions:  Activity:  As tolerated   Discharge Instructions     Call MD for:  difficulty breathing, headache or visual disturbances   Complete by: As directed    Call MD for:  severe uncontrolled pain   Complete by: As  directed    Diet - low sodium heart healthy   Complete by: As directed    Discharge instructions   Complete by: As directed    Follow with Primary MD  [name], MD in 1-2 weeks  Follow-up with your usual hemodialysis center Tuesdays Thursdays and Saturdays-your hemodialysis center will give you IV antibiotics until 5/28.  Please get a complete blood count and chemistry panel checked by your Primary MD at your next visit, and again as instructed by your Primary MD.  Get Medicines reviewed and adjusted: Please take all your medications with you for your next visit with your Primary MD  Laboratory/radiological data: Please request your Primary MD to go over all hospital tests and procedure/radiological results at the follow up, please ask your Primary MD to get all Hospital records sent to his/her office.  In some cases, they will be blood work, cultures and biopsy results pending at the time of your discharge. Please request that your primary care M.D. follows up on these results.  Also Note the following: If you experience worsening of your admission symptoms, develop shortness of breath, life threatening emergency, suicidal or homicidal thoughts you must seek medical attention immediately by calling 911 or calling your MD immediately  if symptoms less severe.  You must read complete instructions/literature along with all the possible adverse reactions/side effects for all the Medicines you take and that have been prescribed to you. Take any new Medicines after you have completely understood and accpet all the possible adverse reactions/side effects.   Do not drive when taking Pain medications or sleeping medications (Benzodaizepines)  Do not take more than prescribed Pain, Sleep and Anxiety Medications. It is not advisable to combine anxiety,sleep and pain medications without talking with your primary care practitioner  Special Instructions: If you have smoked or chewed Tobacco   in the last 2 yrs please stop smoking, stop any regular Alcohol  and or any Recreational drug use.  Wear Seat belts while driving.  Please note: You were cared for by a hospitalist during your hospital stay. Once you are discharged, your primary care physician will handle any further medical issues. Please note that NO REFILLS for any discharge medications will be authorized once you are discharged, as it is imperative that you return to your primary care physician (or establish a relationship with a primary care physician if you do not have one) for your post hospital discharge needs so that they can reassess your need for medications and monitor your lab values.   Discharge wound care:   Complete by: As directed    Clean R buttock/perirectal abscess with soap and water, dry thoroughly, apply Mupirocin ointment to wound bed twice daily and cover with silicone foam or ABD pad and tape  depending on amount of drainage present.  May lift silicone foam daily to replace Mupirocin.  Change foam dressing q3 days and prn soiling   Home infusion instructions   Complete by: As directed    Instructions: Flushing of vascular access device: 0.9% NaCl pre/post medication administration and prn patency; Heparin 100 u/ml, 5ml for implanted ports and Heparin 10u/ml, 5ml for all other central venous catheters.   Increase activity slowly  Complete by: As directed       Allergies as of 03/12/2023       Reactions   Penicillins Anaphylaxis   Has patient had a PCN reaction causing immediate rash, facial/tongue/throat swelling, SOB or lightheadedness with hypotension: Unknown Has patient had a PCN reaction causing severe rash involving mucus membranes or skin necrosis: Unknown Has patient had a PCN reaction that required hospitalization: Unknown Has patient had a PCN reaction occurring within the last 10 years: No If all of the above answers are "NO", then may proceed with Cephalosporin use.   Pepcid  [famotidine] Other (See Comments)   Prilosec [omeprazole] Other (See Comments)        Medication List     STOP taking these medications    aspirin 81 MG chewable tablet   dolutegravir 50 MG tablet Commonly known as: Tivicay   hydrALAZINE 100 MG tablet Commonly known as: APRESOLINE   lisinopril 10 MG tablet Commonly known as: ZESTRIL   minoxidil 10 MG tablet Commonly known as: LONITEN   rilpivirine 25 MG Tabs tablet Commonly known as: EDURANT       TAKE these medications    amiodarone 200 MG tablet Commonly known as: PACERONE Take 200 mg by mouth daily.   amitriptyline 10 MG tablet Commonly known as: ELAVIL Take 10 mg by mouth at bedtime.   atorvastatin 80 MG tablet Commonly known as: LIPITOR Take 80 mg by mouth daily.   carvedilol 6.25 MG tablet Commonly known as: COREG Take 1 tablet (6.25 mg total) by mouth 2 (two) times daily with a meal. What changed:  medication strength how much to take   ceFEPime  IVPB Commonly known as: MAXIPIME Inject 2 g into the vein Every Tuesday,Thursday,and Saturday with dialysis. Indication:  Culture negative AV IE Last Day of Therapy:  04/14/23 Labs - Once weekly:  CBC/D and BMP, ESR and CRP Method of administration: Per HD protocol - to be given at the hemodialysis center   clopidogrel 75 MG tablet Commonly known as: PLAVIX Take 75 mg by mouth daily.   D3-50 1.25 MG (50000 UT) capsule Generic drug: Cholecalciferol Take 50,000 Units by mouth once a week.   dapsone 100 MG tablet Take 1 tablet (100 mg total) by mouth daily.   Eliquis 5 MG Tabs tablet Generic drug: apixaban Take 5 mg by mouth 2 (two) times daily.   isosorbide mononitrate 30 MG 24 hr tablet Commonly known as: IMDUR Take 1 tablet (30 mg total) by mouth daily. What changed:  medication strength how much to take   Juluca 50-25 MG tablet Generic drug: dolutegravir-rilpivirine Take 1 tablet by mouth daily with lunch.   methocarbamol 500 MG  tablet Commonly known as: ROBAXIN Take 500 mg by mouth 4 (four) times daily.   midodrine 10 MG tablet Commonly known as: PROAMATINE Take 1 tablet (10 mg total) by mouth 3 (three) times daily with meals.   mirtazapine 7.5 MG tablet Commonly known as: REMERON Take 7.5 mg by mouth at bedtime.   nitroGLYCERIN 0.4 MG SL tablet Commonly known as: NITROSTAT Place 0.4 mg under the tongue every 5 (five) minutes as needed for chest pain.   Oxycodone HCl 10 MG Tabs Take 10 mg by mouth every 8 (eight) hours as needed (for pain).   sevelamer carbonate 800 MG tablet Commonly known as: RENVELA Take 1,600-2,400 mg by mouth See admin instructions. Take 3 tablets by mouth three times daily with meals and 2 tablets twice daily with snacks  vancomycin  IVPB Inject 750 mg into the vein Every Tuesday,Thursday,and Saturday with dialysis. Indication:  Culture negative AV IE Last Day of Therapy:  04/14/23 Labs - Tuesday/Saturday  CBC/D, BMP, and vancomycin trough, ESR and CRP Method of administration: per HD protocol - to be administered at the HD center               Home Infusion Instuctions  (From admission, onward)           Start     Ordered   03/05/23 0000  Home infusion instructions       Question:  Instructions  Answer:  Flushing of vascular access device: 0.9% NaCl pre/post medication administration and prn patency; Heparin 100 u/ml, 5ml for implanted ports and Heparin 10u/ml, 5ml for all other central venous catheters.   03/05/23 1440              Discharge Care Instructions  (From admission, onward)           Start     Ordered   03/12/23 0000  Discharge wound care:       Comments: Clean R buttock/perirectal abscess with soap and water, dry thoroughly, apply Mupirocin ointment to wound bed twice daily and cover with silicone foam or ABD pad and tape  depending on amount of drainage present.  May lift silicone foam daily to replace Mupirocin.  Change foam dressing q3  days and prn soiling   03/12/23 0853            Allergies  Allergen Reactions   Penicillins Anaphylaxis    Has patient had a PCN reaction causing immediate rash, facial/tongue/throat swelling, SOB or lightheadedness with hypotension: Unknown Has patient had a PCN reaction causing severe rash involving mucus membranes or skin necrosis: Unknown Has patient had a PCN reaction that required hospitalization: Unknown Has patient had a PCN reaction occurring within the last 10 years: No If all of the above answers are "NO", then may proceed with Cephalosporin use.   Pepcid [Famotidine] Other (See Comments)   Prilosec [Omeprazole] Other (See Comments)     Other Procedures/Studies: IR Fluoro Guide CV Line Right  Result Date: 03/11/2023 INDICATION: 62 year old male presents for conversion of a temporary hemodialysis catheter for a tunneled hemodialysis catheter EXAM: IMAGE GUIDED EXCHANGE/CONVERSION OF TEMP HD CATHETER FOR A TUNNELED HD CATHETER MEDICATIONS: 1 g vancomycin. The antibiotic was given in an appropriate time interval prior to skin puncture. ANESTHESIA/SEDATION: Moderate (conscious) sedation was employed during this procedure. A total of Versed 1.0 mg and 1.0 mg Dilaudid was administered intravenously by the radiology nurse. Total intra-service moderate Sedation Time: 13 minutes. The patient's level of consciousness and vital signs were monitored continuously by radiology nursing throughout the procedure under my direct supervision. FLUOROSCOPY: Radiation Exposure Index (as provided by the fluoroscopic device): 1 mGy Kerma COMPLICATIONS: None PROCEDURE: Informed written consent was obtained from the patient after a discussion of the risks, benefits, and alternatives to treatment. Questions regarding the procedure were encouraged and answered. The right neck and chest, including the indwelling catheter were prepped with chlorhexidine in a sterile fashion, and a sterile drape was applied  covering the operative field. Maximum barrier sterile technique with sterile gowns and gloves were used for the procedure. A timeout was performed prior to the initiation of the procedure. Under fluoroscopic guidance, wire was advanced through the IV port of the triple-lumen hemodialysis catheter. Internal measurement was performed. A hemodialysis catheter measuring 19 cm from tip to cuff was  selected. 1% lidocaine was used for local anesthesia with generous infiltration of the chest in the subcutaneous tissues. Guidewire was advanced through the third port of the hemodialysis catheter into the IVC. The selected hemodialysis catheter was then tunneled in a retrograde fashion from the anterior chest wall to the venotomy incision, after making a small incision with 11 blade scalpel. Indwelling catheter was removed from the wire and a peel-away sheath was placed. The catheter was then placed through the peel-away sheath with tip ultimately positioned within the superior aspect of the right atrium. Final catheter positioning was confirmed and documented with a spot radiographic image. The catheter aspirates and flushes normally. The catheter was flushed with appropriate volume heparin dwells. The catheter exit site was secured with a 0-Prolene retention suture. The venotomy incision was closed with Dermabond. Dressings were applied. The patient tolerated the procedure well without immediate post procedural complication. IMPRESSION: Status post image guided exchange of a temporary hemodialysis catheter for a new tunneled right IJ hemodialysis catheter. Signed, Yvone Neu. Miachel Roux, RPVI [facility] Electronically Signed   By: Gilmer Mor D.O.   On: 03/11/2023 12:17   IR Fluoro Guide CV Line Right  Result Date: 03/07/2023 INDICATION: Dialysis access EXAM: Placement of temporary hemodialysis catheter using ultrasound and fluoroscopic guidance  MEDICATIONS: None ANESTHESIA/SEDATION: Local analgesia FLUOROSCOPY TIME:  Fluoroscopy Time: 1.4 minutes (6 mGy) COMPLICATIONS: None immediate. PROCEDURE: Informed written consent was obtained from the patient after a thorough discussion of the procedural risks, benefits and alternatives. All questions were addressed. Maximal Sterile Barrier Technique was utilized including caps, mask, sterile gowns, sterile gloves, sterile drape, hand hygiene and skin antiseptic. A timeout was performed prior to the initiation of the procedure. The patient was placed supine on the exam table. The right neck and chest was prepped and draped in the standard sterile fashion. Ultrasound was used to evaluate the right internal jugular vein, which was found to be widely patent. A permanent image was stored in the electronic medical record. Using ultrasound guidance, the right internal jugular vein was directly punctured using a 21 gauge micropuncture set. Access site was serially dilated to accommodate an 035 wire, which was advanced into the IVC. Subsequently, serial tract dilation was performed and a 13 French temporary triple-lumen hemodialysis catheter was advanced into the central veins, such that the tip overlies the superior cavoatrial junction. The catheter was found to aspirate and flush appropriately. It was locked with the appropriate volume of heparinized saline. It was secured to the skin using silk suture and a sterile dressing. The patient tolerated the procedure well without immediate complication. IMPRESSION: Successful placement of a temporary triple-lumen hemodialysis catheter via the right internal jugular vein. The line is ready for immediate use. The line may be converted to a tunneled hemodialysis catheter at a later time when appropriate. Electronically Signed   By: Olive Bass M.D.   On: 03/07/2023 15:18   IR US Guide Vasc Access Right  Result Date: 03/07/2023 INDICATION: Dialysis access EXAM: Placement of  temporary hemodialysis catheter using ultrasound and fluoroscopic guidance MEDICATIONS: None ANESTHESIA/SEDATION: Local analgesia FLUOROSCOPY TIME:  Fluoroscopy Time: 1.4 minutes (6 mGy) COMPLICATIONS: None immediate. PROCEDURE: Informed written consent was obtained from the patient after a thorough discussion of the procedural risks, benefits and alternatives. All questions were addressed. Maximal Sterile Barrier Technique was utilized including caps, mask, sterile gowns, sterile gloves, sterile drape, hand hygiene and skin antiseptic. A timeout was performed prior to the  initiation of the procedure. The patient was placed supine on the exam table. The right neck and chest was prepped and draped in the standard sterile fashion. Ultrasound was used to evaluate the right internal jugular vein, which was found to be widely patent. A permanent image was stored in the electronic medical record. Using ultrasound guidance, the right internal jugular vein was directly punctured using a 21 gauge micropuncture set. Access site was serially dilated to accommodate an 035 wire, which was advanced into the IVC. Subsequently, serial tract dilation was performed and a 13 French temporary triple-lumen hemodialysis catheter was advanced into the central veins, such that the tip overlies the superior cavoatrial junction. The catheter was found to aspirate and flush appropriately. It was locked with the appropriate volume of heparinized saline. It was secured to the skin using silk suture and a sterile dressing. The patient tolerated the procedure well without immediate complication. IMPRESSION: Successful placement of a temporary triple-lumen hemodialysis catheter via the right internal jugular vein. The line is ready for immediate use. The line may be converted to a tunneled hemodialysis catheter at a later time when appropriate. Electronically Signed   By: Olive Bass M.D.   On: 03/07/2023 15:18   IR Removal Tun Cv Cath W/O  FL  Result Date: 03/04/2023 INDICATION: Patient with bacterial endocarditis, dialysis dependent via right IJ tunnel dialysis catheter placed at outside hospital. Request is made for removal for line holiday. EXAM: REMOVAL TUNNELED CENTRAL VENOUS CATHETER MEDICATIONS: 5 mL 1% lidocaine ANESTHESIA/SEDATION: None FLUOROSCOPY TIME:  None COMPLICATIONS: None immediate. PROCEDURE: Informed written consent was obtained from the patient after a thorough discussion of the procedural risks, benefits and alternatives. All questions were addressed. Maximal Sterile Barrier Technique was utilized including caps, mask, sterile gowns, sterile gloves, sterile drape, hand hygiene and skin antiseptic. A timeout was performed prior to the initiation of the procedure. The patient's right chest and catheter was prepped and draped in a normal sterile fashion. Heparin was removed from both ports of catheter. 1% lidocaine was used for local anesthesia. Using gentle blunt dissection the cuff of the catheter was exposed and the catheter was removed in it's entirety. Pressure was held till hemostasis was obtained. A sterile dressing was applied. The patient tolerated the procedure well with no immediate complications. IMPRESSION: Successful catheter removal as described above. Read by: Loyce Dys PA-C Electronically Signed   By: Simonne Come M.D.   On: 03/04/2023 11:10   DG Chest Portable 1 View  Result Date: 03/01/2023 CLINICAL DATA:  Chest pain. EXAM: PORTABLE CHEST 1 VIEW COMPARISON:  05/10/2018 FINDINGS: Mild cardiomegaly is seen. Prior CABG noted. Right jugular dual-lumen central venous catheter is seen with TIPS overlying the right atrium. No evidence of pneumothorax. Both lungs clear.  No evidence of pleural effusion. IMPRESSION: Mild cardiomegaly. No active lung disease. Electronically Signed   By: Danae Orleans M.D.   On: 03/01/2023 17:50     TODAY-DAY OF DISCHARGE:  Subjective:   [name] today has no headache,no  chest abdominal pain,no new weakness tingling or numbness, feels much better wants to go home today.   Objective:   Blood pressure (!) 152/52, pulse 81, temperature (!) 97.4 F (36.3 C), temperature source Oral, resp. rate 14, height 5\' 5"  (1.651 m), weight 65.6 kg, SpO2 100 %.  Intake/Output Summary (Last 24 hours) at 03/12/2023 0853 Last data filed at 03/11/2023 1700 Gross per 24 hour  Intake 867.7 ml  Output --  Net 867.7 ml  Filed Weights   03/09/23 1236 03/10/23 1242 03/10/23 1618  Weight: 63.7 kg 65.3 kg 65.6 kg    Exam: Awake Alert, Oriented *3, No new F.N deficits, Normal affect [place].AT,PERRAL Supple Neck,No JVD, No cervical lymphadenopathy appriciated.  Symmetrical Chest wall movement, Good air movement bilaterally, CTAB RRR,No Gallops,Rubs or new Murmurs, No Parasternal Heave +ve B.Sounds, Abd Soft, Non tender, No organomegaly appriciated, No rebound -guarding or rigidity. No Cyanosis, Clubbing or edema, No new Rash or bruise   PERTINENT RADIOLOGIC STUDIES: IR Fluoro Guide CV Line Right  Result Date: 03/11/2023 INDICATION: 62 year old male presents for conversion of a temporary hemodialysis catheter for a tunneled hemodialysis catheter EXAM: IMAGE GUIDED EXCHANGE/CONVERSION OF TEMP HD CATHETER FOR A TUNNELED HD CATHETER MEDICATIONS: 1 g vancomycin. The antibiotic was given in an appropriate time interval prior to skin puncture. ANESTHESIA/SEDATION: Moderate (conscious) sedation was employed during this procedure. A total of Versed 1.0 mg and 1.0 mg Dilaudid was administered intravenously by the radiology nurse. Total intra-service moderate Sedation Time: 13 minutes. The patient's level of consciousness and vital signs were monitored continuously by radiology nursing throughout the procedure under my direct supervision. FLUOROSCOPY: Radiation Exposure Index (as provided by the fluoroscopic device): 1 mGy Kerma COMPLICATIONS: None PROCEDURE: Informed written consent was obtained  from the patient after a discussion of the risks, benefits, and alternatives to treatment. Questions regarding the procedure were encouraged and answered. The right neck and chest, including the indwelling catheter were prepped with chlorhexidine in a sterile fashion, and a sterile drape was applied covering the operative field. Maximum barrier sterile technique with sterile gowns and gloves were used for the procedure. A timeout was performed prior to the initiation of the procedure. Under fluoroscopic guidance, wire was advanced through the IV port of the triple-lumen hemodialysis catheter. Internal measurement was performed. A hemodialysis catheter measuring 19 cm from tip to cuff was selected. 1% lidocaine was used for local anesthesia with generous infiltration of the chest in the subcutaneous tissues. Guidewire was advanced through the third port of the hemodialysis catheter into the IVC. The selected hemodialysis catheter was then tunneled in a retrograde fashion from the anterior chest wall to the venotomy incision, after making a small incision with 11 blade scalpel. Indwelling catheter was removed from the wire and a peel-away sheath was placed. The catheter was then placed through the peel-away sheath with tip ultimately positioned within the superior aspect of the right atrium. Final catheter positioning was confirmed and documented with a spot radiographic image. The catheter aspirates and flushes normally. The catheter was flushed with appropriate volume heparin dwells. The catheter exit site was secured with a 0-Prolene retention suture. The venotomy incision was closed with Dermabond. Dressings were applied. The patient tolerated the procedure well without immediate post procedural complication. IMPRESSION: Status post image guided exchange of a temporary hemodialysis catheter for a new tunneled right IJ hemodialysis catheter. Signed, Yvone Neu. Miachel Roux, RPVI [facility] Radiology Electronically Signed   By: Gilmer Mor D.O.   On: 03/11/2023 12:17     PERTINENT LAB RESULTS: CBC: Recent Labs    03/10/23 1147  WBC 6.5  HGB 10.2*  HCT 33.3*  PLT 222   CMET CMP     Component Value Date/Time   NA 138 03/10/2023 1147   K 4.9 03/10/2023 1147   CL 98 03/10/2023 1147   CO2 25 03/10/2023 1147   GLUCOSE 143 (H) 03/10/2023 1147   BUN 62 (H) 03/10/2023  1147   CREATININE 9.45 (H) 03/10/2023 1147   CREATININE 0.67 (L) 04/23/2017 1728   CALCIUM 8.8 (L) 03/10/2023 1147   CALCIUM 7.3 (L) 05/13/2018 0241   PROT 8.2 (H) 03/03/2023 0409   ALBUMIN 2.9 (L) 03/10/2023 1147   AST 30 03/03/2023 0409   ALT 24 03/03/2023 0409   ALKPHOS 76 03/03/2023 0409   BILITOT 0.5 03/03/2023 0409   GFRNONAA 6 (L) 03/10/2023 1147   GFRNONAA >89 09/14/2014 0947   GFRAA 12 (L) 05/26/2018 1208   GFRAA >89 09/14/2014 0947    GFR Estimated Creatinine Clearance: 7.1 mL/min (A) (by C-G formula based on SCr of 9.45 mg/dL (H)). No results for input(s): "LIPASE", "AMYLASE" in the last 72 hours. No results for input(s): "CKTOTAL", "CKMB", "CKMBINDEX", "TROPONINI" in the last 72 hours. Invalid input(s): "POCBNP" No results for input(s): "DDIMER" in the last 72 hours. No results for input(s): "HGBA1C" in the last 72 hours. No results for input(s): "CHOL", "HDL", "LDLCALC", "TRIG", "CHOLHDL", "LDLDIRECT" in the last 72 hours. No results for input(s): "TSH", "T4TOTAL", "T3FREE", "THYROIDAB" in the last 72 hours.  Invalid input(s): "FREET3" No results for input(s): "VITAMINB12", "FOLATE", "FERRITIN", "TIBC", "IRON", "RETICCTPCT" in the last 72 hours. Coags: No results for input(s): "INR" in the last 72 hours.  Invalid input(s): "PT" Microbiology: No results found for this or any previous visit (from the past 240 hour(s)).  FURTHER DISCHARGE INSTRUCTIONS:  Get Medicines reviewed and adjusted: Please take all your medications with you for your  next visit with your Primary MD  Laboratory/radiological data: Please request your Primary MD to go over all hospital tests and procedure/radiological results at the follow up, please ask your Primary MD to get all Hospital records sent to his/her office.  In some cases, they will be blood work, cultures and biopsy results pending at the time of your discharge. Please request that your primary care M.D. goes through all the records of your hospital data and follows up on these results.  Also Note the following: If you experience worsening of your admission symptoms, develop shortness of breath, life threatening emergency, suicidal or homicidal thoughts you must seek medical attention immediately by calling 911 or calling your MD immediately  if symptoms less severe.  You must read complete instructions/literature along with all the possible adverse reactions/side effects for all the Medicines you take and that have been prescribed to you. Take any new Medicines after you have completely understood and accpet all the possible adverse reactions/side effects.   Do not drive when taking Pain medications or sleeping medications (Benzodaizepines)  Do not take more than prescribed Pain, Sleep and Anxiety Medications. It is not advisable to combine anxiety,sleep and pain medications without talking with your primary care practitioner  Special Instructions: If you have smoked or chewed Tobacco  in the last 2 yrs please stop smoking, stop any regular Alcohol  and or any Recreational drug use.  Wear Seat belts while driving.  Please note: You were cared for by a hospitalist during your hospital stay. Once you are discharged, your primary care physician will handle any further medical issues. Please note that NO REFILLS for any discharge medications will be authorized once you are discharged, as it is imperative that you return to your primary care physician (or establish a relationship with a primary care  physician if you do not have one) for your post hospital discharge needs so that they can reassess your need for medications and monitor your lab values.  Total Time  spent coordinating discharge including counseling, education and face to face time equals greater than 30 minutes.  SignedJeoffrey Massed 03/12/2023 8:53 AM

## 2023-03-12 NOTE — Plan of Care (Signed)
Discharge

## 2023-03-12 NOTE — Progress Notes (Signed)
Patient is cussing and disrespecting the nurse he does not want to wait for iv antibiotics and mad bc he only could have tylenol for pain if he was discharging. Patient very rude and nasty. MD and charge nurse made aware of patient rude and disrespectful behavior. Patient ripped his iv out and bleed all over the room and himself.  Tried to help patient with dressing for his pulled IV site and he continued to cuss and be disrespectful> Charge nurse to discharge patient.

## 2023-03-12 NOTE — Progress Notes (Signed)
POST HD TX NOTE  03/12/23 1506  Vitals  Temp 98.7 F (37.1 C)  Temp Source Oral  BP (!) 128/58  MAP (mmHg) 79  BP Location Right Arm  BP Method Automatic  Patient Position (if appropriate) Lying  Pulse Rate 71  Pulse Rate Source Monitor  ECG Heart Rate 73  Resp 15  Oxygen Therapy  SpO2 100 %  O2 Device Room Air  Patient Activity (if Appropriate) In bed  Pulse Oximetry Type Continuous  Oximetry Probe Site Changed No  During Treatment Monitoring  Intra-Hemodialysis Comments (S)   (post HD tx VS check)  Post Treatment  Dialyzer Clearance Lightly streaked  Duration of HD Treatment -hour(s) 3 hour(s)  Hemodialysis Intake (mL) 0 mL  Liters Processed 72  Fluid Removed (mL) 2000 mL  Tolerated HD Treatment Yes  Post-Hemodialysis Comments (S)  tx completed w/o problem, UF goal met, blood rinsed back, VSS. Medication Admin: Heparin 2600 units bolus, Heparin Dwells 3200 units. unable to admin the ordered Vancomycin as the random vanc lab draw had not resulted in time to allow for [place] to admin during the last hour of tx. this will be reported to the nurse that i call report back to  Hemodialysis Catheter Right Internal jugular Double lumen Permanent (Tunneled)  Placement Date/Time: 03/11/23 0922   Serial / Lot #: [id number]  Expiration Date: 08/17/27  Time Out: Correct patient;Correct site;Correct procedure  Maximum sterile barrier precautions: Hand hygiene;Cap;Mask;Sterile gown;Sterile gloves;Large sterile s...  Site Condition No complications  Blue Lumen Status Heparin locked;Dead end cap in place  Red Lumen Status Heparin locked;Dead end cap in place  Purple Lumen Status N/A  Catheter fill solution Heparin 1000 units/ml  Catheter fill volume (Arterial) 1.6 cc  Catheter fill volume (Venous) 1.6  Dressing Type Transparent  Dressing Status Antimicrobial disc in place;Clean, Dry, Intact  Drainage Description None  Dressing Change Due 03/18/23  Post treatment catheter status Capped and  Clamped

## 2023-03-12 NOTE — Progress Notes (Signed)
Patient to hemo.  

## 2023-03-12 NOTE — Progress Notes (Addendum)
Subjective:  in hd  , no co, for dc today . Stressed to him hd op compliance to obtain ABX   Objective Vital signs in last 24 hours: Vitals:   03/11/23 1925 03/12/23 0105 03/12/23 0818 03/12/23 1141  BP: (!) 108/52 (!) 102/48 (!) 152/52   Pulse:   81 70  Resp: Temp: 97.6 F (36.4 C)  (!) 97.4 F (36.3 C) 98.1 F (36.7 C)  TempSrc:   Oral   SpO2:    100%  Weight:    68 kg  Height:       Weight change:   Physical Exam: General: Chronically ill adult male, NAD Heart: RRR, no MRG Lungs: CTA bilaterally nonlabored breathing Abdomen: NABS, soft NTND Extremities: No pedal edema  Dialysis Access: Right IJ TDC patent on hd    OP Dialysis Orders: TTS [place]/ [place] [facility] 4h  420/ 800   68kg   2/2/ bath  Heparin ?  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 TDC removed 4/17. Roderick Pee placed 4/20, right IJ TDC placed this morning, on Vancomycin + Cefepime with HD x 6 week course. ESRD: Usual TTS schedule - now on schedule . HD IJ temporary cath exchanged for  R IJ [facility] 03/11/23  HTN/volume: BP variable a lot beginning of the week secondary to intermittent agitation, BP this a.m. 105/50.  Taper dose carvedilol, hold Imdur for systolic under 120// calm not agitated / Noted  on midodrine now in hospital, appears close to euvolemic, well below prior EDW. Will lower EDW at discharge.  Chest x-ray 4/14 no active lung disease Anemia of ESRD: . Hgb 10.2< 10.5 <9.8 was .(However because of infection no IV iron yet) monitor Hgb trend to resume ESA as needed none currently Secondary HPTH: Corr Ca ok and phos 5.6  - continue sevelamer as binder. HIV: Continue home meds  Lenny Pastel, PA-C [facility] Beeper [phone] 03/12/2023,12:25 PM  LOS: 10 days   Patient seen and examined, agree with above note with above modifications.  Patient seen on HD.  No particular complaints and is for  discharge today with prolonged antibiotic course which we will communicate to his outpatient center Annie Sable, MD 03/12/2023    Labs: Basic Metabolic Panel: Recent Labs  Lab 03/07/23 0552 03/08/23 0323 03/09/23 0734 03/10/23 1147  NA 132* 135 134* 138  K 5.8* 4.6 5.2* 4.9  CL 93* 93* 95* 98  CO2 21* GLUCOSE 106* 81 94 143*  BUN 91* 45* 70* 62*  CREATININE 13.89* 9.19* 11.39* 9.45*  CALCIUM 8.4* 8.7* 8.6* 8.8*  PHOS 8.2* 5.8*  --  5.6*   Liver Function Tests: Recent Labs  Lab 03/07/23 0552 03/08/23 0323 03/10/23 1147  ALBUMIN 3.1* 2.8* 2.9*   No results for input(s): "LIPASE", "AMYLASE" in the last 168 hours. No results for input(s): "AMMONIA" in the last 168 hours. CBC: Recent Labs  Lab 03/06/23 0750 03/07/23 0552 03/08/23 0323 03/09/23 0734 03/10/23 1147  WBC 7.3 6.4 6.2 7.4 6.5  NEUTROABS  --  3.2  --  3.6  --   HGB 11.4* 9.7* 9.8* 10.5* 10.2*  HCT 37.5* 30.5* 32.3* 33.8* 33.3*  MCV 90.1 88.7 91.2 89.4 89.8  PLT 233 185 205 217 222   Cardiac Enzymes: No results for input(s): "CKTOTAL", "CKMB", "CKMBINDEX", "TROPONINI" in the last 168 hours. CBG: Recent Labs  Lab 03/07/23  1032  GLUCAP 167*    Medications:  ceFEPime (MAXIPIME) IV     vancomycin Stopped (03/10/23 1552)    amiodarone  200 mg Oral Daily   amitriptyline  10 mg Oral QHS   apixaban  5 mg Oral BID   atorvastatin  80 mg Oral Daily   carvedilol  6.25 mg Oral BID WC   Chlorhexidine Gluconate Cloth  6 each Topical Daily   clopidogrel  75 mg Oral Daily   dapsone  100 mg Oral Daily   dolutegravir-rilpivirine  1 tablet Oral Q lunch   heparin sodium (porcine)  2,600 Units Intracatheter Once   isosorbide mononitrate  30 mg Oral Daily   midodrine  10 mg Oral TID WC   mirtazapine  7.5 mg Oral QHS   mupirocin ointment   Topical BID   sevelamer carbonate  2,400 mg Oral TID WC   sodium chloride flush  10-40 mL Intracatheter Q12H

## 2023-05-18 DEATH — deceased
